# Patient Record
Sex: Female | Born: 1980 | Race: Black or African American | Hispanic: No | Marital: Single | Smoking: Former smoker
Health system: Southern US, Community
[De-identification: ages and names within clinical notes are randomized; demographics above are authoritative.]

## PROBLEM LIST (undated history)

## (undated) DIAGNOSIS — M797 Fibromyalgia: Secondary | ICD-10-CM

## (undated) DIAGNOSIS — T402X5A Adverse effect of other opioids, initial encounter: Secondary | ICD-10-CM

## (undated) DIAGNOSIS — Z87898 Personal history of other specified conditions: Secondary | ICD-10-CM

## (undated) DIAGNOSIS — R198 Other specified symptoms and signs involving the digestive system and abdomen: Secondary | ICD-10-CM

## (undated) DIAGNOSIS — R87629 Unspecified abnormal cytological findings in specimens from vagina: Secondary | ICD-10-CM

## (undated) DIAGNOSIS — I493 Ventricular premature depolarization: Secondary | ICD-10-CM

## (undated) DIAGNOSIS — T7840XA Allergy, unspecified, initial encounter: Secondary | ICD-10-CM

## (undated) DIAGNOSIS — K219 Gastro-esophageal reflux disease without esophagitis: Secondary | ICD-10-CM

## (undated) DIAGNOSIS — R011 Cardiac murmur, unspecified: Secondary | ICD-10-CM

## (undated) DIAGNOSIS — I10 Essential (primary) hypertension: Secondary | ICD-10-CM

## (undated) DIAGNOSIS — IMO0001 Reserved for inherently not codable concepts without codable children: Secondary | ICD-10-CM

## (undated) DIAGNOSIS — F32A Depression, unspecified: Secondary | ICD-10-CM

## (undated) DIAGNOSIS — Z5189 Encounter for other specified aftercare: Secondary | ICD-10-CM

## (undated) DIAGNOSIS — F419 Anxiety disorder, unspecified: Secondary | ICD-10-CM

## (undated) DIAGNOSIS — Z8619 Personal history of other infectious and parasitic diseases: Secondary | ICD-10-CM

## (undated) DIAGNOSIS — IMO0002 Reserved for concepts with insufficient information to code with codable children: Secondary | ICD-10-CM

## (undated) DIAGNOSIS — K5903 Drug induced constipation: Secondary | ICD-10-CM

## (undated) DIAGNOSIS — Z8719 Personal history of other diseases of the digestive system: Secondary | ICD-10-CM

## (undated) DIAGNOSIS — K449 Diaphragmatic hernia without obstruction or gangrene: Secondary | ICD-10-CM

## (undated) DIAGNOSIS — J189 Pneumonia, unspecified organism: Secondary | ICD-10-CM

## (undated) DIAGNOSIS — I429 Cardiomyopathy, unspecified: Secondary | ICD-10-CM

## (undated) DIAGNOSIS — Z9889 Other specified postprocedural states: Secondary | ICD-10-CM

## (undated) DIAGNOSIS — J3800 Paralysis of vocal cords and larynx, unspecified: Secondary | ICD-10-CM

## (undated) DIAGNOSIS — K220 Achalasia of cardia: Secondary | ICD-10-CM

## (undated) DIAGNOSIS — R002 Palpitations: Secondary | ICD-10-CM

## (undated) DIAGNOSIS — R87619 Unspecified abnormal cytological findings in specimens from cervix uteri: Secondary | ICD-10-CM

## (undated) DIAGNOSIS — R09A2 Foreign body sensation, throat: Secondary | ICD-10-CM

## (undated) DIAGNOSIS — M5481 Occipital neuralgia: Secondary | ICD-10-CM

## (undated) DIAGNOSIS — M1990 Unspecified osteoarthritis, unspecified site: Secondary | ICD-10-CM

## (undated) DIAGNOSIS — F329 Major depressive disorder, single episode, unspecified: Secondary | ICD-10-CM

## (undated) DIAGNOSIS — I499 Cardiac arrhythmia, unspecified: Secondary | ICD-10-CM

## (undated) DIAGNOSIS — B009 Herpesviral infection, unspecified: Secondary | ICD-10-CM

## (undated) DIAGNOSIS — R0989 Other specified symptoms and signs involving the circulatory and respiratory systems: Secondary | ICD-10-CM

## (undated) HISTORY — DX: Cardiomyopathy, unspecified: I42.9

## (undated) HISTORY — DX: Other specified postprocedural states: Z98.890

## (undated) HISTORY — DX: Other specified symptoms and signs involving the circulatory and respiratory systems: R09.89

## (undated) HISTORY — DX: Diaphragmatic hernia without obstruction or gangrene: K44.9

## (undated) HISTORY — DX: Allergy, unspecified, initial encounter: T78.40XA

## (undated) HISTORY — DX: Major depressive disorder, single episode, unspecified: F32.9

## (undated) HISTORY — DX: Ventricular premature depolarization: I49.3

## (undated) HISTORY — DX: Achalasia of cardia: K22.0

## (undated) HISTORY — DX: Cardiac murmur, unspecified: R01.1

## (undated) HISTORY — DX: Personal history of other diseases of the digestive system: Z87.19

## (undated) HISTORY — DX: Pneumonia, unspecified organism: J18.9

## (undated) HISTORY — DX: Depression, unspecified: F32.A

## (undated) HISTORY — DX: Foreign body sensation, throat: R09.A2

## (undated) HISTORY — DX: Essential (primary) hypertension: I10

## (undated) HISTORY — DX: Herpesviral infection, unspecified: B00.9

## (undated) HISTORY — PX: TONSILLECTOMY: SUR1361

## (undated) HISTORY — DX: Other specified symptoms and signs involving the digestive system and abdomen: R19.8

## (undated) HISTORY — DX: Paralysis of vocal cords and larynx, unspecified: J38.00

## (undated) HISTORY — PX: TUBAL LIGATION: SHX77

## (undated) HISTORY — DX: Gastro-esophageal reflux disease without esophagitis: K21.9

## (undated) HISTORY — DX: Unspecified abnormal cytological findings in specimens from cervix uteri: R87.619

## (undated) HISTORY — DX: Occipital neuralgia: M54.81

## (undated) HISTORY — DX: Reserved for concepts with insufficient information to code with codable children: IMO0002

## (undated) HISTORY — PX: ESOPHAGOGASTRODUODENOSCOPY: SHX1529

---
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
## 1999-01-06 ENCOUNTER — Inpatient Hospital Stay (HOSPITAL_COMMUNITY): Admission: AD | Admit: 1999-01-06 | Discharge: 1999-01-06 | Payer: Self-pay | Admitting: *Deleted

## 1999-01-24 ENCOUNTER — Inpatient Hospital Stay (HOSPITAL_COMMUNITY): Admission: AD | Admit: 1999-01-24 | Discharge: 1999-01-24 | Payer: Self-pay | Admitting: Obstetrics & Gynecology

## 1999-02-18 ENCOUNTER — Inpatient Hospital Stay (HOSPITAL_COMMUNITY): Admission: AD | Admit: 1999-02-18 | Discharge: 1999-02-18 | Payer: Self-pay | Admitting: Obstetrics & Gynecology

## 1999-02-25 ENCOUNTER — Inpatient Hospital Stay (HOSPITAL_COMMUNITY): Admission: AD | Admit: 1999-02-25 | Discharge: 1999-02-25 | Payer: Self-pay | Admitting: Obstetrics

## 1999-02-25 ENCOUNTER — Encounter: Payer: Self-pay | Admitting: Obstetrics

## 1999-03-08 ENCOUNTER — Other Ambulatory Visit: Admission: RE | Admit: 1999-03-08 | Discharge: 1999-03-08 | Payer: Self-pay | Admitting: Obstetrics

## 1999-03-18 ENCOUNTER — Ambulatory Visit (HOSPITAL_COMMUNITY): Admission: RE | Admit: 1999-03-18 | Discharge: 1999-03-18 | Payer: Self-pay | Admitting: *Deleted

## 1999-04-08 ENCOUNTER — Inpatient Hospital Stay (HOSPITAL_COMMUNITY): Admission: AD | Admit: 1999-04-08 | Discharge: 1999-04-08 | Payer: Self-pay | Admitting: *Deleted

## 1999-04-27 ENCOUNTER — Inpatient Hospital Stay (HOSPITAL_COMMUNITY): Admission: AD | Admit: 1999-04-27 | Discharge: 1999-04-29 | Payer: Self-pay | Admitting: Obstetrics

## 1999-04-28 ENCOUNTER — Encounter: Payer: Self-pay | Admitting: Obstetrics

## 1999-05-06 ENCOUNTER — Encounter: Payer: Self-pay | Admitting: Obstetrics & Gynecology

## 1999-05-06 ENCOUNTER — Inpatient Hospital Stay (HOSPITAL_COMMUNITY): Admission: AD | Admit: 1999-05-06 | Discharge: 1999-05-06 | Payer: Self-pay | Admitting: Obstetrics

## 1999-05-27 ENCOUNTER — Inpatient Hospital Stay (HOSPITAL_COMMUNITY): Admission: AD | Admit: 1999-05-27 | Discharge: 1999-05-27 | Payer: Self-pay | Admitting: Obstetrics

## 1999-05-28 ENCOUNTER — Ambulatory Visit (HOSPITAL_COMMUNITY): Admission: RE | Admit: 1999-05-28 | Discharge: 1999-05-28 | Payer: Self-pay | Admitting: Obstetrics

## 1999-05-30 ENCOUNTER — Inpatient Hospital Stay (HOSPITAL_COMMUNITY): Admission: AD | Admit: 1999-05-30 | Discharge: 1999-05-30 | Payer: Self-pay | Admitting: Obstetrics

## 1999-06-17 ENCOUNTER — Encounter: Payer: Self-pay | Admitting: Obstetrics & Gynecology

## 1999-06-17 ENCOUNTER — Inpatient Hospital Stay (HOSPITAL_COMMUNITY): Admission: RE | Admit: 1999-06-17 | Discharge: 1999-06-17 | Payer: Self-pay | Admitting: Obstetrics

## 1999-06-20 ENCOUNTER — Inpatient Hospital Stay (HOSPITAL_COMMUNITY): Admission: AD | Admit: 1999-06-20 | Discharge: 1999-06-22 | Payer: Self-pay | Admitting: Obstetrics & Gynecology

## 2000-03-26 ENCOUNTER — Encounter: Admission: RE | Admit: 2000-03-26 | Discharge: 2000-03-26 | Payer: Self-pay | Admitting: Obstetrics

## 2001-04-20 ENCOUNTER — Emergency Department (HOSPITAL_COMMUNITY): Admission: EM | Admit: 2001-04-20 | Discharge: 2001-04-20 | Payer: Self-pay | Admitting: Emergency Medicine

## 2001-07-29 HISTORY — PX: BREAST SURGERY: SHX581

## 2001-08-19 ENCOUNTER — Encounter (HOSPITAL_BASED_OUTPATIENT_CLINIC_OR_DEPARTMENT_OTHER): Payer: Self-pay | Admitting: General Surgery

## 2001-08-19 ENCOUNTER — Encounter: Admission: RE | Admit: 2001-08-19 | Discharge: 2001-08-19 | Payer: Self-pay | Admitting: General Surgery

## 2001-08-20 ENCOUNTER — Ambulatory Visit (HOSPITAL_BASED_OUTPATIENT_CLINIC_OR_DEPARTMENT_OTHER): Admission: RE | Admit: 2001-08-20 | Discharge: 2001-08-20 | Payer: Self-pay | Admitting: General Surgery

## 2001-08-20 ENCOUNTER — Encounter (INDEPENDENT_AMBULATORY_CARE_PROVIDER_SITE_OTHER): Payer: Self-pay | Admitting: *Deleted

## 2001-09-08 ENCOUNTER — Emergency Department (HOSPITAL_COMMUNITY): Admission: EM | Admit: 2001-09-08 | Discharge: 2001-09-09 | Payer: Self-pay | Admitting: Emergency Medicine

## 2002-12-29 HISTORY — PX: ADENOIDECTOMY: SUR15

## 2003-01-18 ENCOUNTER — Encounter (INDEPENDENT_AMBULATORY_CARE_PROVIDER_SITE_OTHER): Payer: Self-pay | Admitting: *Deleted

## 2003-01-18 ENCOUNTER — Ambulatory Visit (HOSPITAL_BASED_OUTPATIENT_CLINIC_OR_DEPARTMENT_OTHER): Admission: RE | Admit: 2003-01-18 | Discharge: 2003-01-18 | Payer: Self-pay | Admitting: *Deleted

## 2003-06-05 ENCOUNTER — Emergency Department (HOSPITAL_COMMUNITY): Admission: EM | Admit: 2003-06-05 | Discharge: 2003-06-05 | Payer: Self-pay | Admitting: Emergency Medicine

## 2003-06-26 ENCOUNTER — Encounter: Admission: RE | Admit: 2003-06-26 | Discharge: 2003-09-24 | Payer: Self-pay | Admitting: Obstetrics and Gynecology

## 2003-11-15 ENCOUNTER — Other Ambulatory Visit: Admission: RE | Admit: 2003-11-15 | Discharge: 2003-11-15 | Payer: Self-pay | Admitting: Obstetrics and Gynecology

## 2004-01-18 ENCOUNTER — Emergency Department (HOSPITAL_COMMUNITY): Admission: EM | Admit: 2004-01-18 | Discharge: 2004-01-18 | Payer: Self-pay | Admitting: Emergency Medicine

## 2004-02-25 ENCOUNTER — Emergency Department (HOSPITAL_COMMUNITY): Admission: EM | Admit: 2004-02-25 | Discharge: 2004-02-25 | Payer: Self-pay | Admitting: Emergency Medicine

## 2004-04-22 ENCOUNTER — Emergency Department (HOSPITAL_COMMUNITY): Admission: EM | Admit: 2004-04-22 | Discharge: 2004-04-23 | Payer: Self-pay | Admitting: *Deleted

## 2004-12-04 ENCOUNTER — Other Ambulatory Visit: Admission: RE | Admit: 2004-12-04 | Discharge: 2004-12-04 | Payer: Self-pay | Admitting: Obstetrics and Gynecology

## 2005-02-22 ENCOUNTER — Emergency Department (HOSPITAL_COMMUNITY): Admission: EM | Admit: 2005-02-22 | Discharge: 2005-02-22 | Payer: Self-pay | Admitting: Emergency Medicine

## 2005-04-12 ENCOUNTER — Emergency Department (HOSPITAL_COMMUNITY): Admission: EM | Admit: 2005-04-12 | Discharge: 2005-04-12 | Payer: Self-pay | Admitting: Emergency Medicine

## 2005-07-24 ENCOUNTER — Inpatient Hospital Stay (HOSPITAL_COMMUNITY): Admission: AD | Admit: 2005-07-24 | Discharge: 2005-07-24 | Payer: Self-pay | Admitting: *Deleted

## 2005-10-16 ENCOUNTER — Inpatient Hospital Stay (HOSPITAL_COMMUNITY): Admission: AD | Admit: 2005-10-16 | Discharge: 2005-10-16 | Payer: Self-pay | Admitting: Obstetrics and Gynecology

## 2005-11-18 ENCOUNTER — Ambulatory Visit: Payer: Self-pay | Admitting: Internal Medicine

## 2006-01-29 DIAGNOSIS — IMO0001 Reserved for inherently not codable concepts without codable children: Secondary | ICD-10-CM

## 2006-01-29 DIAGNOSIS — Z5189 Encounter for other specified aftercare: Secondary | ICD-10-CM

## 2006-01-29 HISTORY — PX: DILATION AND CURETTAGE OF UTERUS: SHX78

## 2006-01-29 HISTORY — DX: Encounter for other specified aftercare: Z51.89

## 2006-01-29 HISTORY — DX: Reserved for inherently not codable concepts without codable children: IMO0001

## 2006-02-11 ENCOUNTER — Inpatient Hospital Stay (HOSPITAL_COMMUNITY): Admission: AD | Admit: 2006-02-11 | Discharge: 2006-02-13 | Payer: Self-pay | Admitting: Obstetrics and Gynecology

## 2006-02-13 ENCOUNTER — Inpatient Hospital Stay (HOSPITAL_COMMUNITY): Admission: AD | Admit: 2006-02-13 | Discharge: 2006-02-14 | Payer: Self-pay | Admitting: *Deleted

## 2006-02-14 ENCOUNTER — Encounter (INDEPENDENT_AMBULATORY_CARE_PROVIDER_SITE_OTHER): Payer: Self-pay | Admitting: Specialist

## 2006-03-30 ENCOUNTER — Other Ambulatory Visit: Admission: RE | Admit: 2006-03-30 | Discharge: 2006-03-30 | Payer: Self-pay | Admitting: Obstetrics and Gynecology

## 2006-04-30 ENCOUNTER — Other Ambulatory Visit: Admission: RE | Admit: 2006-04-30 | Discharge: 2006-04-30 | Payer: Self-pay | Admitting: Obstetrics and Gynecology

## 2006-09-26 ENCOUNTER — Emergency Department (HOSPITAL_COMMUNITY): Admission: EM | Admit: 2006-09-26 | Discharge: 2006-09-26 | Payer: Self-pay | Admitting: Emergency Medicine

## 2006-11-17 ENCOUNTER — Other Ambulatory Visit: Admission: RE | Admit: 2006-11-17 | Discharge: 2006-11-17 | Payer: Self-pay | Admitting: Obstetrics and Gynecology

## 2007-02-10 ENCOUNTER — Encounter: Admission: RE | Admit: 2007-02-10 | Discharge: 2007-02-10 | Payer: Self-pay | Admitting: Otolaryngology

## 2007-02-17 ENCOUNTER — Emergency Department (HOSPITAL_COMMUNITY): Admission: EM | Admit: 2007-02-17 | Discharge: 2007-02-17 | Payer: Self-pay | Admitting: Emergency Medicine

## 2007-05-18 ENCOUNTER — Ambulatory Visit: Payer: Self-pay | Admitting: Internal Medicine

## 2007-10-13 ENCOUNTER — Emergency Department (HOSPITAL_COMMUNITY): Admission: EM | Admit: 2007-10-13 | Discharge: 2007-10-13 | Payer: Self-pay | Admitting: Emergency Medicine

## 2008-09-20 ENCOUNTER — Ambulatory Visit: Payer: Self-pay | Admitting: Cardiology

## 2008-09-20 ENCOUNTER — Ambulatory Visit: Payer: Self-pay

## 2008-09-22 ENCOUNTER — Encounter: Admission: RE | Admit: 2008-09-22 | Discharge: 2008-09-22 | Payer: Self-pay | Admitting: Otolaryngology

## 2008-10-09 DIAGNOSIS — J45909 Unspecified asthma, uncomplicated: Secondary | ICD-10-CM | POA: Insufficient documentation

## 2008-10-09 DIAGNOSIS — F329 Major depressive disorder, single episode, unspecified: Secondary | ICD-10-CM

## 2008-10-09 DIAGNOSIS — F3289 Other specified depressive episodes: Secondary | ICD-10-CM

## 2008-10-09 HISTORY — DX: Unspecified asthma, uncomplicated: J45.909

## 2008-10-09 HISTORY — DX: Other specified depressive episodes: F32.89

## 2008-10-09 HISTORY — DX: Major depressive disorder, single episode, unspecified: F32.9

## 2008-10-12 ENCOUNTER — Ambulatory Visit: Payer: Self-pay | Admitting: Internal Medicine

## 2008-11-07 ENCOUNTER — Ambulatory Visit: Payer: Self-pay | Admitting: Internal Medicine

## 2008-11-11 ENCOUNTER — Emergency Department (HOSPITAL_COMMUNITY): Admission: EM | Admit: 2008-11-11 | Discharge: 2008-11-11 | Payer: Self-pay | Admitting: Emergency Medicine

## 2008-11-12 ENCOUNTER — Emergency Department (HOSPITAL_COMMUNITY): Admission: EM | Admit: 2008-11-12 | Discharge: 2008-11-12 | Payer: Self-pay | Admitting: Emergency Medicine

## 2008-11-27 ENCOUNTER — Ambulatory Visit: Payer: Self-pay | Admitting: Internal Medicine

## 2008-11-30 ENCOUNTER — Ambulatory Visit: Payer: Self-pay | Admitting: Gastroenterology

## 2008-11-30 DIAGNOSIS — K219 Gastro-esophageal reflux disease without esophagitis: Secondary | ICD-10-CM | POA: Insufficient documentation

## 2008-11-30 DIAGNOSIS — K5900 Constipation, unspecified: Secondary | ICD-10-CM

## 2008-11-30 HISTORY — DX: Gastro-esophageal reflux disease without esophagitis: K21.9

## 2008-11-30 HISTORY — DX: Constipation, unspecified: K59.00

## 2008-12-08 ENCOUNTER — Encounter: Payer: Self-pay | Admitting: Internal Medicine

## 2008-12-19 ENCOUNTER — Ambulatory Visit: Payer: Self-pay | Admitting: Gastroenterology

## 2009-01-24 ENCOUNTER — Telehealth: Payer: Self-pay | Admitting: Gastroenterology

## 2009-01-25 ENCOUNTER — Telehealth: Payer: Self-pay | Admitting: Gastroenterology

## 2009-01-25 ENCOUNTER — Ambulatory Visit (HOSPITAL_COMMUNITY): Admission: RE | Admit: 2009-01-25 | Discharge: 2009-01-25 | Payer: Self-pay | Admitting: Gastroenterology

## 2009-01-25 ENCOUNTER — Ambulatory Visit: Payer: Self-pay | Admitting: Gastroenterology

## 2009-01-26 ENCOUNTER — Ambulatory Visit (HOSPITAL_COMMUNITY): Admission: RE | Admit: 2009-01-26 | Discharge: 2009-01-26 | Payer: Self-pay | Admitting: Gastroenterology

## 2009-02-05 ENCOUNTER — Ambulatory Visit: Payer: Self-pay | Admitting: Gastroenterology

## 2009-02-12 ENCOUNTER — Encounter: Payer: Self-pay | Admitting: Gastroenterology

## 2009-04-25 ENCOUNTER — Encounter (INDEPENDENT_AMBULATORY_CARE_PROVIDER_SITE_OTHER): Payer: Self-pay | Admitting: *Deleted

## 2009-08-01 ENCOUNTER — Emergency Department (HOSPITAL_COMMUNITY): Admission: EM | Admit: 2009-08-01 | Discharge: 2009-08-01 | Payer: Self-pay | Admitting: Family Medicine

## 2009-08-09 ENCOUNTER — Encounter (INDEPENDENT_AMBULATORY_CARE_PROVIDER_SITE_OTHER): Payer: Self-pay | Admitting: *Deleted

## 2009-08-09 ENCOUNTER — Telehealth: Payer: Self-pay | Admitting: Gastroenterology

## 2009-08-09 ENCOUNTER — Emergency Department (HOSPITAL_COMMUNITY): Admission: EM | Admit: 2009-08-09 | Discharge: 2009-08-09 | Payer: Self-pay | Admitting: Emergency Medicine

## 2009-08-10 ENCOUNTER — Ambulatory Visit: Payer: Self-pay | Admitting: Gastroenterology

## 2009-08-10 ENCOUNTER — Ambulatory Visit (HOSPITAL_COMMUNITY): Admission: RE | Admit: 2009-08-10 | Discharge: 2009-08-10 | Payer: Self-pay | Admitting: Gastroenterology

## 2009-08-10 DIAGNOSIS — R1310 Dysphagia, unspecified: Secondary | ICD-10-CM | POA: Insufficient documentation

## 2009-08-10 DIAGNOSIS — K222 Esophageal obstruction: Secondary | ICD-10-CM

## 2009-08-10 HISTORY — DX: Esophageal obstruction: K22.2

## 2009-08-10 HISTORY — DX: Dysphagia, unspecified: R13.10

## 2009-08-11 ENCOUNTER — Emergency Department (HOSPITAL_COMMUNITY): Admission: EM | Admit: 2009-08-11 | Discharge: 2009-08-11 | Payer: Self-pay | Admitting: Emergency Medicine

## 2009-08-21 ENCOUNTER — Encounter: Payer: Self-pay | Admitting: Physician Assistant

## 2009-09-04 ENCOUNTER — Emergency Department (HOSPITAL_COMMUNITY): Admission: EM | Admit: 2009-09-04 | Discharge: 2009-09-05 | Payer: Self-pay | Admitting: Emergency Medicine

## 2009-09-05 ENCOUNTER — Telehealth: Payer: Self-pay | Admitting: Gastroenterology

## 2009-09-10 ENCOUNTER — Telehealth: Payer: Self-pay | Admitting: Gastroenterology

## 2009-09-11 ENCOUNTER — Encounter: Payer: Self-pay | Admitting: Gastroenterology

## 2009-09-17 ENCOUNTER — Encounter: Admission: RE | Admit: 2009-09-17 | Discharge: 2009-09-17 | Payer: Self-pay | Admitting: Otolaryngology

## 2009-09-18 ENCOUNTER — Telehealth (INDEPENDENT_AMBULATORY_CARE_PROVIDER_SITE_OTHER): Payer: Self-pay | Admitting: *Deleted

## 2009-09-30 ENCOUNTER — Emergency Department (HOSPITAL_COMMUNITY): Admission: EM | Admit: 2009-09-30 | Discharge: 2009-09-30 | Payer: Self-pay | Admitting: Orthopedic Surgery

## 2009-10-02 ENCOUNTER — Ambulatory Visit (HOSPITAL_COMMUNITY): Admission: RE | Admit: 2009-10-02 | Discharge: 2009-10-02 | Payer: Self-pay | Admitting: Otolaryngology

## 2009-10-10 ENCOUNTER — Telehealth: Payer: Self-pay | Admitting: Gastroenterology

## 2009-11-05 ENCOUNTER — Ambulatory Visit (HOSPITAL_COMMUNITY): Admission: RE | Admit: 2009-11-05 | Discharge: 2009-11-05 | Payer: Self-pay | Admitting: Otolaryngology

## 2010-01-03 ENCOUNTER — Telehealth: Payer: Self-pay | Admitting: Gastroenterology

## 2010-01-03 ENCOUNTER — Encounter: Payer: Self-pay | Admitting: Gastroenterology

## 2010-01-04 ENCOUNTER — Ambulatory Visit: Payer: Self-pay | Admitting: Gastroenterology

## 2010-01-05 ENCOUNTER — Emergency Department (HOSPITAL_COMMUNITY): Admission: EM | Admit: 2010-01-05 | Discharge: 2010-01-05 | Payer: Self-pay | Admitting: Emergency Medicine

## 2010-01-15 ENCOUNTER — Ambulatory Visit (HOSPITAL_COMMUNITY): Admission: RE | Admit: 2010-01-15 | Discharge: 2010-01-15 | Payer: Self-pay | Admitting: Gastroenterology

## 2010-01-15 ENCOUNTER — Encounter: Payer: Self-pay | Admitting: Gastroenterology

## 2010-01-22 ENCOUNTER — Ambulatory Visit: Payer: Self-pay | Admitting: Gastroenterology

## 2010-01-24 ENCOUNTER — Telehealth: Payer: Self-pay | Admitting: Gastroenterology

## 2010-01-31 ENCOUNTER — Ambulatory Visit: Payer: Self-pay | Admitting: Gastroenterology

## 2010-02-12 ENCOUNTER — Encounter: Payer: Self-pay | Admitting: Gastroenterology

## 2010-02-12 ENCOUNTER — Ambulatory Visit (HOSPITAL_COMMUNITY): Admission: RE | Admit: 2010-02-12 | Discharge: 2010-02-12 | Payer: Self-pay | Admitting: Gastroenterology

## 2010-03-22 ENCOUNTER — Ambulatory Visit: Payer: Self-pay | Admitting: Gastroenterology

## 2010-03-30 ENCOUNTER — Emergency Department (HOSPITAL_COMMUNITY): Admission: EM | Admit: 2010-03-30 | Discharge: 2010-03-30 | Payer: Self-pay | Admitting: Emergency Medicine

## 2010-04-04 ENCOUNTER — Telehealth: Payer: Self-pay | Admitting: Gastroenterology

## 2010-06-18 ENCOUNTER — Telehealth: Payer: Self-pay | Admitting: Gastroenterology

## 2010-07-22 ENCOUNTER — Encounter: Payer: Self-pay | Admitting: Gastroenterology

## 2010-08-12 ENCOUNTER — Telehealth: Payer: Self-pay | Admitting: Gastroenterology

## 2010-09-12 ENCOUNTER — Emergency Department (HOSPITAL_COMMUNITY): Admission: EM | Admit: 2010-09-12 | Discharge: 2010-09-12 | Payer: Self-pay | Admitting: Emergency Medicine

## 2010-11-30 ENCOUNTER — Emergency Department (HOSPITAL_COMMUNITY)
Admission: EM | Admit: 2010-11-30 | Discharge: 2010-12-01 | Payer: Self-pay | Source: Home / Self Care | Admitting: Emergency Medicine

## 2011-01-19 ENCOUNTER — Encounter: Payer: Self-pay | Admitting: Gastroenterology

## 2011-01-28 NOTE — Progress Notes (Signed)
Summary: Schedule follow up appointment  Phone Note Outgoing Call Call back at Home Phone [phone]   Call placed by: Merri Ray CMA Duncan Dull),  January 24, 2010 10:47 AM Summary of Call: Called pt L/M to R/C to schedule a follow up office appointment with Dr Arlyce Dice Initial call taken by: Merri Ray CMA Duncan Dull),  January 24, 2010 10:48 AM  Follow-up for Phone Call        ---- Converted from flag ---- ---- 01/22/2010 12:24 PM, Louis Meckel MD wrote: please schedule OV Follow-up by: Merri Ray CMA Duncan Dull),  January 24, 2010 11:02 AM  Additional Follow-up for Phone Call Additional follow up Details #1::        Pt scheduled for F/U appointment on 2/3 at 8:30 Additional Follow-up by: Merri Ray CMA Duncan Dull),  January 24, 2010 11:03 AM

## 2011-01-28 NOTE — Procedures (Signed)
Summary: Probe Orders/[facility]  Probe Orders/[facility]   Imported By: Lester Davison 02/01/2010 09:33:47  _____________________________________________________________________  External Attachment:    Type:   Image     Comment:   External Document

## 2011-01-28 NOTE — Progress Notes (Signed)
Summary: [facility] Appt. Scheduled  Phone Note Call from Patient Call back at Home Phone [phone]   Caller: Patient Call For: Arlyce Dice Reason for Call: Talk to Nurse Summary of Call: Patient wants to know when is she going to be set up for surgery. Initial call taken by: Tawni Levy,  April 04, 2010 10:08 AM  Follow-up for Phone Call        Pt. is aware she is scheduled to see Dr.Kundu on 05-22-10 at 1:15pm. Pt. aware they will be mailing her the appt. information.  Pt. instructed to call back as needed.  Follow-up by: Laureen Ochs LPN,  April 04, 2010 10:41 AM

## 2011-01-28 NOTE — Progress Notes (Signed)
Summary: Severe Dysphagia  Phone Note Outgoing Call Call back at [facility] Phone [phone]   Call placed by: Merri Ray CMA Duncan Dull),  January 03, 2010 2:42 PM Summary of Call: Called pt to inform that her insurance will not cover the prevacid solutab. Pt c/o severe dysphagia, stating its getting alot worse. Cant swallow pills and carafate is no good. Wants powder form of Omeprazole. Scheduled her to see Dr Arlyce Dice at 8:30am on 01/04/2010 Initial call taken by: Merri Ray CMA Duncan Dull),  January 03, 2010 2:44 PM

## 2011-01-28 NOTE — Procedures (Signed)
Summary: Upper Endoscopy  Patient: Raven [name] Note: All result statuses are Final unless otherwise noted.  Tests: (1) Upper Endoscopy (EGD)   EGD Upper Endoscopy       DONE     [facility]     [address] [place], [place]  [address]           ENDOSCOPY PROCEDURE REPORT           PATIENT:  [name], [name]  MR#:  [id number]     BIRTHDATE:  08/01/1980, 29 yrs. old  GENDER:  female           ENDOSCOPIST:  Barbette Hair. Arlyce Dice, MD     Referred by:           PROCEDURE DATE:  02/12/2010     PROCEDURE:  EGD w/botox injection     ASA CLASS:  Class II     INDICATIONS:  Botox injection for achalasia           MEDICATIONS:   Fentanyl 100 mcg, Versed 7 mg IV, glycopyrrolate     (Robinal) 0.2 mg     TOPICAL ANESTHETIC:  Cetacaine Spray           DESCRIPTION OF PROCEDURE:   After the risks benefits and     alternatives of the procedure were thoroughly explained, informed     consent was obtained.  The EG-[id number] ([id number]) endoscope was     introduced through the mouth and advanced to the third portion of     the duodenum, without limitations.  The instrument was slowly     withdrawn as the mucosa was fully examined.           stenosis at the gastroesophageal junction. Scope easily passes     through. botox injection 25 units (1cc) injected into each     quadrant intramucosally at the GE junction  Otherwise the     examination was normal.    Retroflexed views revealed no     abnormalities.    The scope was then withdrawn from the patient     and the procedure completed.           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 Arlyce Dice, MD           CC:           n.     eSIGNED:   Barbette Hair. Francyne Arreaga at 02/12/2010 01:21 PM           Guinevere Ferrari, [id number]  Note: An exclamation mark (!) indicates a result that was not dispersed into the flowsheet. Document Creation Date: 02/12/2010 1:21 PM _______________________________________________________________________  (1) Order result status: Final Collection or observation date-time: 02/12/2010 13:17 Requested date-time:  Receipt date-time:  Reported date-time:  Referring Physician:   Ordering Physician: Melvia Heaps [phone]) Specimen  Source:  Source: Launa Grill Order Number: [id number] Lab site:

## 2011-01-28 NOTE — Assessment & Plan Note (Signed)
Summary: F/U from [facility] study/rs  8:15 appt   History of Present Illness Visit Type: Follow-up Visit Primary GI MD: Melvia Heaps MD [facility] Primary Provider: [facility]  Requesting Provider: n/a Chief Complaint: Results from Manometry; dysphagia History of Present Illness:   [name] has returned on esophageal manometry.  Manometry demonstrated a low percent nonperistaltic contractions in the esophageal body.  Although the LES resting pressure was normal, relaxation was incomplete.  Findings suggest early achalasia.   GI Review of Systems    Reports acid reflux, dysphagia with liquids, dysphagia with solids, and  vomiting.      Denies abdominal pain, belching, bloating, chest pain, heartburn, loss of appetite, nausea, vomiting blood, weight loss, and  weight gain.        Denies anal fissure, black tarry stools, change in bowel habit, constipation, diarrhea, diverticulosis, fecal incontinence, heme positive stool, hemorrhoids, irritable bowel syndrome, jaundice, light color stool, liver problems, rectal bleeding, and  rectal pain.    Current Medications (verified): 1)  Qvar 80 Mcg/act Aers (Beclomethasone Dipropionate) .Marland Kitchen.. 1 Puff Twice A Day 2)  Flonase 50 Mcg/act Susp (Fluticasone Propionate) .... As Needed 3)  Carafate 1 Gm/37ml Susp (Sucralfate) .Marland Kitchen.. 1gm in Cup of Water Four Times A Day 4)  Prilosec 20 Mg Cpdr (Omeprazole) .... Take One Tab Daily  Allergies (verified): No Known Drug Allergies  Past History:  Past Medical History: Reviewed history from 08/10/2009 and no changes required. ASTHMA (ICD-493.90) Depression GERD,HX OF ESOPHAGEAL STRICTURE  Past Surgical History: Reviewed history from 08/10/2009 and no changes required. tonsillectomy adnoidectomy tumor removed rt. breast EGD WITH ESOPHAGEAL DILATION 12/09  Family History: Reviewed history from 02/05/2009 and no changes required. Heart dz- Father Stroke- Mother No FH of Colon  Cancer:  Social History: Reviewed history from 02/05/2009 and no changes required. Current smoker.  Smokes 5 cigs per day.   ETOH on occ Single Children Occupation: Student  Review of Systems       The patient complains of allergy/sinus, headaches-new, and sleeping problems.    Vital Signs:  Patient profile:   30 year old female Height:      69 inches Weight:      199.50 pounds BMI:     29.57 Pulse rate:   80 / minute Pulse rhythm:   regular BP sitting:   106 / 68  (left arm) Cuff size:   regular  Vitals Entered By: June McMurray CMA Duncan Dull) (January 31, 2010 9:41 AM)   Impression & Recommendations:  Problem # 1:  DYSPHAGIA (NFA-213.08)  She probably has an esophageal motility disorder such as achalasia.  Recommendations #1 upper endoscopy with Botox injection  Risks, alternatives, and complications of the procedure, including bleeding, perforation, and possible need for surgery, were explained to the patient.  Patient's questions were answered.  Orders: ZENDO with Botox (ZENDO/Botox)  [facility] Inst[facility]: [facility] 2)  Your EGD is scheduled for 02/12/2010 at 12:30pmat [facility] Endo 3)  The medication list was reviewed and reconciled.  All changed / newly prescribed medications were explained.  A complete medication list was provided to the patient / caregiver.

## 2011-01-28 NOTE — Medication Information (Signed)
Summary: Prevacid-DENIED/[place] [employer]  Prevacid-DENIED/ Medicaid   Imported By: Sherian Rein 01/08/2010 10:35:45  _____________________________________________________________________  External Attachment:    Type:   Image     Comment:   External Document

## 2011-01-28 NOTE — Progress Notes (Signed)
Summary: Triage  Phone Note Call from Patient Call back at Home Phone [phone]   Caller: Patient Call For: Dr. Arlyce Dice Reason for Call: Talk to Nurse Summary of Call: would like a doctor's note to her school explaining her GI conditions Initial call taken by: Vallarie Mare,  August 12, 2010 10:54 AM  Follow-up for Phone Call        Pt. wants copies of her medical records for school. She was advised to come to our medical records dept., sign a release and they will give her a copy of whatever records she requests. Pt. instructed to call back as needed.  Follow-up by: Laureen Ochs LPN,  August 12, 2010 11:20 AM

## 2011-01-28 NOTE — Letter (Signed)
Summary: [facility]  Gastroenterology/[facility]   Imported By: Lester [place] 08/07/2010 10:02:15  _____________________________________________________________________  External Attachment:    Type:   Image     Comment:   External Document

## 2011-01-28 NOTE — Procedures (Signed)
Summary: Manometry/[facility]  Manometry/[facility]   Imported By: Lester Ransom 01/26/2010 08:56:59  _____________________________________________________________________  External Attachment:    Type:   Image     Comment:   External Document

## 2011-01-28 NOTE — Assessment & Plan Note (Signed)
Summary: HAVING SEVERE DYSPHAGIA/NEEDS A POWDER FORM OF PPI   History of Present Illness Visit Type: follow  Primary GI MD: Melvia Heaps MD [facility] Primary Provider: [facility]  Requesting Provider: n/a Chief Complaint: dysphagia and vomiting  History of Present Illness:   Ms. Nelson has returned for reevaluation of dysphagia.  She underwent ENT examination that was unremarkable.  Neck CT apparently was ordered though I do not have the results.  Ms. Mclaurin is complaining of dysphagia to solids and liquids.  With swallowing liquids and solids she has the seasation of contents staying in her chest.  She may bend over and vomit or regurgitate  undigested food.  She has undergone dilatation in the past.  Her last endoscopy no stricture was seen.  Barium swallow also did not demonstrate any abnormalities.   GI Review of Systems      Denies abdominal pain, acid reflux, belching, bloating, chest pain, dysphagia with liquids, dysphagia with solids, heartburn, loss of appetite, nausea, vomiting, vomiting blood, weight loss, and  weight gain.        Denies anal fissure, black tarry stools, change in bowel habit, constipation, diarrhea, diverticulosis, fecal incontinence, heme positive stool, hemorrhoids, irritable bowel syndrome, jaundice, light color stool, liver problems, rectal bleeding, and  rectal pain.    Current Medications (verified): 1)  Qvar 80 Mcg/act Aers (Beclomethasone Dipropionate) .Marland Kitchen.. 1 Puff Twice A Day 2)  Flonase 50 Mcg/act Susp (Fluticasone Propionate) .... As Needed 3)  Carafate 1 Gm/42ml Susp (Sucralfate) .Marland Kitchen.. 1gm in Cup of Water Four Times A Day  Allergies (verified): No Known Drug Allergies  Past History:  Past Medical History: Reviewed history from 08/10/2009 and no changes required. ASTHMA (ICD-493.90) Depression GERD,HX OF ESOPHAGEAL STRICTURE  Past Surgical History: Reviewed history from 08/10/2009 and no changes  required. tonsillectomy adnoidectomy tumor removed rt. breast EGD WITH ESOPHAGEAL DILATION 12/09  Family History: Reviewed history from 02/05/2009 and no changes required. Heart dz- Father Stroke- Mother No FH of Colon Cancer:  Social History: Reviewed history from 02/05/2009 and no changes required. Current smoker.  Smokes 5 cigs per day.   ETOH on occ Single Children Occupation: Student  Review of Systems  The patient denies allergy/sinus, anemia, anxiety-new, arthritis/joint pain, back pain, blood in urine, breast changes/lumps, change in vision, confusion, cough, coughing up blood, depression-new, fainting, fatigue, fever, headaches-new, hearing problems, heart murmur, heart rhythm changes, itching, menstrual pain, muscle pains/cramps, night sweats, nosebleeds, pregnancy symptoms, shortness of breath, skin rash, sleeping problems, sore throat, swelling of feet/legs, swollen lymph glands, thirst - excessive , urination - excessive , urination changes/pain, urine leakage, vision changes, and voice change.    Vital Signs:  Patient profile:   30 year old female Height:      69 inches Weight:      203 pounds BMI:     30.09 BSA:     2.08 Pulse rate:   80 / minute Pulse rhythm:   regular BP sitting:   130 / 78  (left arm) Cuff size:   regular  Vitals Entered By: Ok Anis CMA (January 04, 2010 8:20 AM)   Impression & Recommendations:  Problem # 1:  DYSPHAGIA (ICD-787.29) I suspect that she may have a primary motility disorder such as achalasia. Eosinophilic esophagitis is less likely.  Recommendations #1 esophageal manometry Prescriptions: PRILOSEC 20 MG CPDR (OMEPRAZOLE) take one tab daily  #30 x 2   Entered and Authorized by:   Louis Meckel MD   Signed by:  Louis Meckel MD on 01/04/2010   Method used:   Electronically to        [facility] (retail)       [address]       [place], [place]  [id number]       Ph: [phone]        Fax: [phone]   RxID:   [id number]   Appended Document: Orders Update    Clinical Lists Changes  Orders: Added new Test order of Mano/24 hour pH Probe (Mano/24 pH) - Signed

## 2011-01-28 NOTE — Progress Notes (Signed)
Summary: triage  Phone Note Call from Patient Call back at Home Phone [phone]   Caller: Patient Call For: Arlyce Dice Reason for Call: Refill Medication Summary of Call: Patient states that she needs refill for her Ativan for panic attacks given to her at the emergency room and she also states that she has a  knot in her bottom. Initial call taken by: Tawni Levy,  June 18, 2010 11:50 AM  Follow-up for Phone Call        1) Pt. will need to see her PCP about treatment for anxiety.   2) Pt. c/o a knot inside of her rectum, her PCP scheduled an appt. with a surgeon for consult on a cyst. Pt. will have the report faxed to Dr.Kaplan. Pt. instructed to call back as needed.   Follow-up by: Laureen Ochs LPN,  June 18, 2010 12:06 PM

## 2011-01-28 NOTE — Medication Information (Signed)
Summary: PREVACID approval/ACS  PREVACID approval/ACS   Imported By: Lester [place] 01/17/2010 09:53:12  _____________________________________________________________________  External Attachment:    Type:   Image     Comment:   External Document

## 2011-01-28 NOTE — Assessment & Plan Note (Signed)
Summary: F/U FROM ENDO/BOTOX ON 02-12-10.          [name]   History of Present Illness Visit Type: Follow-up Visit Primary GI MD: Melvia Heaps MD [facility] Primary Provider: [facility]  Requesting Provider: n/a Chief Complaint: Patient complains she is still having some vomiting at least once a day. She states that she feels like food is getting stuck in a pocket in her throat, she is a little better but not all the way.  History of Present Illness:   Raven [name] has returned following upper endoscopy with Botox injection of her LES.  The patient reports significant improvement in her dysphagia to liquids.  She still has moderate dysphagia to solids and will cough up food  that does not seem to pass  into her stomach.   On Prilosec her pyrosis is gone.   GI Review of Systems    Reports dysphagia with solids and  vomiting.      Denies abdominal pain, acid reflux, belching, bloating, chest pain, dysphagia with liquids, heartburn, loss of appetite, nausea, vomiting blood, weight loss, and  weight gain.        Denies anal fissure, black tarry stools, change in bowel habit, constipation, diarrhea, diverticulosis, fecal incontinence, heme positive stool, hemorrhoids, irritable bowel syndrome, jaundice, light color stool, liver problems, rectal bleeding, and  rectal pain.    Current Medications (verified): 1)  Qvar 80 Mcg/act Aers (Beclomethasone Dipropionate) .Marland Kitchen.. 1 Puff Twice A Day 2)  Flonase 50 Mcg/act Susp (Fluticasone Propionate) .... As Needed 3)  Prilosec 20 Mg Cpdr (Omeprazole) .... Take One Tab Daily  Allergies (verified): No Known Drug Allergies  Past History:  Past Medical History: ASTHMA (ICD-493.90) Depression GERD,HX OF ESOPHAGEAL STRICTURE achalasia  Past Surgical History: Reviewed history from 08/10/2009 and no changes required. tonsillectomy adnoidectomy tumor removed rt. breast EGD WITH ESOPHAGEAL DILATION 12/09  Family History: Reviewed history from  02/05/2009 and no changes required. Heart dz- Father Stroke- Mother No FH of Colon Cancer:  Social History: Reviewed history from 02/05/2009 and no changes required. Current smoker.  Smokes 5 cigs per day.   ETOH on occ Single Children Occupation: Student  Review of Systems  The patient denies allergy/sinus, anemia, anxiety-new, arthritis/joint pain, back pain, blood in urine, breast changes/lumps, change in vision, confusion, cough, coughing up blood, depression-new, fainting, fatigue, fever, headaches-new, hearing problems, heart murmur, heart rhythm changes, itching, menstrual pain, muscle pains/cramps, night sweats, nosebleeds, pregnancy symptoms, shortness of breath, skin rash, sleeping problems, sore throat, swelling of feet/legs, swollen lymph glands, thirst - excessive , urination - excessive , urination changes/pain, urine leakage, vision changes, and voice change.    Vital Signs:  Patient profile:   30 year old female Height:      69 inches Weight:      196.4 pounds BMI:     29.11 Pulse rate:   88 / minute Pulse rhythm:   regular BP sitting:   112 / 70  (left arm) Cuff size:   regular  Vitals Entered By: Harlow Mares CMA Duncan Dull) (March 22, 2010 4:08 PM)   Impression & Recommendations:  Problem # 1:  DYSPHAGIA (ICD-787.29) status post esophageal dilatation with [facility] dilator to 18 mm in December, 2009 status post barium esophagram x2 in January, 2010 and August, 2010 because of severe dysphagia to solids and liquids.  Both x-ray studies negative esophageal manometry in January, 2011 demonstrating normal resting LES pressure with 84% relaxation, 89% peristaltic contractions and 11% nonperistalytic (nontransmitted) contractions status  post Botox injection of the LES with moderate improvement but incomplete  I strongly suspect that the patient has a motility disorder which probably is early achalasia.  The manometry study is equivocal, however.  Recommendations #1 I  will obtain a second opinion before referring this patient for a myotomy.  In the interim she will stay on Prilosec  Patient Instructions: 1)  We will be contacting you with an appointment to [facility] as soon as one becomes available. 2)  The medication list was reviewed and reconciled.  All changed / newly prescribed medications were explained.  A complete medication list was provided to the patient / caregiver.

## 2011-01-28 NOTE — Procedures (Signed)
Summary: Instructions for procedure/[facility] (out pt)  Instructions for procedure/[facility] (out pt)   Imported By: Sherian Rein 02/01/2010 15:13:39  _____________________________________________________________________  External Attachment:    Type:   Image     Comment:   External Document

## 2011-01-28 NOTE — Letter (Signed)
Summary: EGD Instructions  [facility]  [address] [place], [place] [address]   Phone: [phone]  Fax: [phone]       Raven [name]    08/01/1980    MRN: [id number]       Procedure Day /Date:TUESDAY 02/12/2010     Arrival Time: 11:30AM     Procedure Time:12:30PM    Location of Procedure:                     X  [facility] ( Outpatient Registration)    PREPARATION FOR ENDOSCOPY/BOTOX INJ   On 02/12/2010 THE DAY OF THE PROCEDURE:  1.   No solid foods, milk or milk products are allowed after midnight the night before your procedure.  2.   Do not drink anything colored red or purple.  Avoid juices with pulp.  No orange juice.  3.  You may drink clear liquids until 8:30AM , which is 4 hours before your procedure.                                                                                                CLEAR LIQUIDS INCLUDE: Water Jello Ice Popsicles Tea (sugar ok, no milk/cream) Powdered fruit flavored drinks Coffee (sugar ok, no milk/cream) Gatorade Juice: apple, white grape, white cranberry  Lemonade Clear bullion, consomm, broth Carbonated beverages (any kind) Strained chicken noodle soup Hard Candy   MEDICATION INSTRUCTIONS  Unless otherwise instructed, you should take regular prescription medications with a small sip of water as early as possible the morning of your procedure.           OTHER INSTRUCTIONS  You will need a responsible adult at least 31 years of age to accompany you and drive you home.   This person must remain in the waiting room during your procedure.  Wear loose fitting clothing that is easily removed.  Leave jewelry and other valuables at home.  However, you may wish to bring a book to read or an iPod/MP3 player to listen to music as you wait for your procedure to start.  Remove all body piercing jewelry and leave at home.  Total time from sign-in until discharge is approximately 2-3 hours.  You  should go home directly after your procedure and rest.  You can resume normal activities the day after your procedure.  The day of your procedure you should not:   Drive   Make legal decisions   Operate machinery   Drink alcohol   Return to work  You will receive specific instructions about eating, activities and medications before you leave.    The above instructions have been reviewed and explained to me by   _______________________    I fully understand and can verbalize these instructions _____________________________ Date _________

## 2011-02-11 ENCOUNTER — Emergency Department (HOSPITAL_COMMUNITY): Payer: Medicaid Other

## 2011-02-11 ENCOUNTER — Emergency Department (HOSPITAL_COMMUNITY)
Admission: EM | Admit: 2011-02-11 | Discharge: 2011-02-11 | Disposition: A | Discharge status: Home / Self Care | Payer: Medicaid Other | Attending: Emergency Medicine | Admitting: Emergency Medicine

## 2011-02-11 DIAGNOSIS — R51 Headache: Secondary | ICD-10-CM | POA: Insufficient documentation

## 2011-02-11 DIAGNOSIS — J45909 Unspecified asthma, uncomplicated: Secondary | ICD-10-CM | POA: Insufficient documentation

## 2011-02-11 DIAGNOSIS — R05 Cough: Secondary | ICD-10-CM | POA: Insufficient documentation

## 2011-02-11 DIAGNOSIS — R059 Cough, unspecified: Secondary | ICD-10-CM | POA: Insufficient documentation

## 2011-03-07 ENCOUNTER — Emergency Department (HOSPITAL_COMMUNITY)
Admission: EM | Admit: 2011-03-07 | Discharge: 2011-03-07 | Disposition: A | Discharge status: Home / Self Care | Payer: Medicaid Other | Attending: Emergency Medicine | Admitting: Emergency Medicine

## 2011-03-07 ENCOUNTER — Emergency Department (HOSPITAL_COMMUNITY): Payer: Medicaid Other

## 2011-03-07 DIAGNOSIS — F411 Generalized anxiety disorder: Secondary | ICD-10-CM | POA: Insufficient documentation

## 2011-03-07 DIAGNOSIS — R002 Palpitations: Secondary | ICD-10-CM | POA: Insufficient documentation

## 2011-03-07 DIAGNOSIS — Z79899 Other long term (current) drug therapy: Secondary | ICD-10-CM | POA: Insufficient documentation

## 2011-03-07 DIAGNOSIS — R0789 Other chest pain: Secondary | ICD-10-CM | POA: Insufficient documentation

## 2011-03-07 DIAGNOSIS — J45909 Unspecified asthma, uncomplicated: Secondary | ICD-10-CM | POA: Insufficient documentation

## 2011-03-07 LAB — RAPID URINE DRUG SCREEN, HOSP PERFORMED
Amphetamines: NOT DETECTED
Barbiturates: NOT DETECTED
Tetrahydrocannabinol: NOT DETECTED

## 2011-03-07 LAB — MAGNESIUM: Magnesium: 2.1 mg/dL (ref 1.5–2.5)

## 2011-03-07 LAB — COMPREHENSIVE METABOLIC PANEL
Alkaline Phosphatase: 61 U/L (ref 39–117)
BUN: 6 mg/dL (ref 6–23)
Calcium: 9.3 mg/dL (ref 8.4–10.5)
Creatinine, Ser: 0.77 mg/dL (ref 0.4–1.2)
Glucose, Bld: 117 mg/dL — ABNORMAL HIGH (ref 70–99)
Potassium: 3.5 mEq/L (ref 3.5–5.1)
Total Protein: 7.5 g/dL (ref 6.0–8.3)

## 2011-03-07 LAB — DIFFERENTIAL
Basophils Absolute: 0 10*3/uL (ref 0.0–0.1)
Basophils Relative: 0 % (ref 0–1)
Eosinophils Absolute: 0.1 10*3/uL (ref 0.0–0.7)
Eosinophils Relative: 1 % (ref 0–5)
Neutrophils Relative %: 63 % (ref 43–77)

## 2011-03-07 LAB — PHOSPHORUS: Phosphorus: 2.6 mg/dL (ref 2.3–4.6)

## 2011-03-07 LAB — CBC
Platelets: 227 10*3/uL (ref 150–400)
RDW: 13 % (ref 11.5–15.5)
WBC: 10.6 10*3/uL — ABNORMAL HIGH (ref 4.0–10.5)

## 2011-03-07 LAB — URINALYSIS, ROUTINE W REFLEX MICROSCOPIC
Hgb urine dipstick: NEGATIVE
Protein, ur: NEGATIVE mg/dL
Urobilinogen, UA: 1 mg/dL (ref 0.0–1.0)

## 2011-03-07 LAB — POCT CARDIAC MARKERS
CKMB, poc: <1 ng/mL — ABNORMAL LOW (ref 1.0–8.0)
Myoglobin, poc: 51.9 ng/mL (ref 12–200)

## 2011-03-13 LAB — WET PREP, GENITAL: Trich, Wet Prep: NONE SEEN

## 2011-03-13 LAB — GC/CHLAMYDIA PROBE AMP, GENITAL: Chlamydia, DNA Probe: NEGATIVE

## 2011-03-15 LAB — DIFFERENTIAL
Basophils Relative: 1 % (ref 0–1)
Eosinophils Absolute: 0.1 10*3/uL (ref 0.0–0.7)
Neutrophils Relative %: 62 % (ref 43–77)

## 2011-03-15 LAB — URINALYSIS, ROUTINE W REFLEX MICROSCOPIC
Bilirubin Urine: NEGATIVE
Nitrite: NEGATIVE
Protein, ur: 30 mg/dL — AB
Specific Gravity, Urine: 1.014 (ref 1.005–1.030)
Urobilinogen, UA: 1 mg/dL (ref 0.0–1.0)

## 2011-03-15 LAB — POCT CARDIAC MARKERS
CKMB, poc: <1 ng/mL — ABNORMAL LOW (ref 1.0–8.0)
Troponin i, poc: <0.05 ng/mL (ref 0.00–0.09)

## 2011-03-15 LAB — CBC
MCHC: 34.5 g/dL (ref 30.0–36.0)
MCV: 91.1 fL (ref 78.0–100.0)
Platelets: 213 10*3/uL (ref 150–400)

## 2011-03-15 LAB — WET PREP, GENITAL
Trich, Wet Prep: NONE SEEN
Yeast Wet Prep HPF POC: NONE SEEN

## 2011-03-15 LAB — GC/CHLAMYDIA PROBE AMP, GENITAL: Chlamydia, DNA Probe: NEGATIVE

## 2011-03-15 LAB — BASIC METABOLIC PANEL
BUN: 7 mg/dL (ref 6–23)
CO2: 27 mEq/L (ref 19–32)
Chloride: 105 mEq/L (ref 96–112)
Creatinine, Ser: 0.84 mg/dL (ref 0.4–1.2)
Glucose, Bld: 104 mg/dL — ABNORMAL HIGH (ref 70–99)

## 2011-03-15 LAB — URINE MICROSCOPIC-ADD ON

## 2011-03-21 ENCOUNTER — Encounter: Payer: Self-pay | Admitting: *Deleted

## 2011-04-03 ENCOUNTER — Encounter: Payer: Medicaid Other | Admitting: Cardiovascular Disease

## 2011-04-03 LAB — CBC
HCT: 42.7 % (ref 36.0–46.0)
Hemoglobin: 14.7 g/dL (ref 12.0–15.0)
MCHC: 34.3 g/dL (ref 30.0–36.0)
MCV: 91.2 fL (ref 78.0–100.0)
Platelets: 222 10*3/uL (ref 150–400)
RBC: 4.68 MIL/uL (ref 3.87–5.11)
RDW: 13.7 % (ref 11.5–15.5)
WBC: 12.4 10*3/uL — ABNORMAL HIGH (ref 4.0–10.5)

## 2011-04-03 LAB — URINALYSIS, ROUTINE W REFLEX MICROSCOPIC
Bilirubin Urine: NEGATIVE
Glucose, UA: NEGATIVE mg/dL
Ketones, ur: NEGATIVE mg/dL
Leukocytes, UA: NEGATIVE
Nitrite: NEGATIVE
Specific Gravity, Urine: 1.015 (ref 1.005–1.030)
pH: 6 (ref 5.0–8.0)

## 2011-04-03 LAB — RAPID URINE DRUG SCREEN, HOSP PERFORMED
Amphetamines: NOT DETECTED
Barbiturates: NOT DETECTED
Benzodiazepines: NOT DETECTED
Cocaine: NOT DETECTED
Opiates: NOT DETECTED
Tetrahydrocannabinol: NOT DETECTED

## 2011-04-03 LAB — DIFFERENTIAL
Basophils Absolute: 0 10*3/uL (ref 0.0–0.1)
Basophils Relative: 0 % (ref 0–1)
Lymphocytes Relative: 21 % (ref 12–46)
Neutro Abs: 9.1 10*3/uL — ABNORMAL HIGH (ref 1.7–7.7)
Neutrophils Relative %: 73 % (ref 43–77)

## 2011-04-03 LAB — COMPREHENSIVE METABOLIC PANEL
Albumin: 4.5 g/dL (ref 3.5–5.2)
Alkaline Phosphatase: 84 U/L (ref 39–117)
BUN: 7 mg/dL (ref 6–23)
CO2: 25 mEq/L (ref 19–32)
Chloride: 102 mEq/L (ref 96–112)
Creatinine, Ser: 0.8 mg/dL (ref 0.4–1.2)
GFR calc non Af Amer: >60 mL/min (ref >60)
Glucose, Bld: 83 mg/dL (ref 70–99)
Total Bilirubin: 0.7 mg/dL (ref 0.3–1.2)

## 2011-04-03 LAB — URINE MICROSCOPIC-ADD ON

## 2011-04-03 LAB — URINE CULTURE: Colony Count: 50000

## 2011-04-05 LAB — CBC
HCT: 39.4 % (ref 36.0–46.0)
Hemoglobin: 13.4 g/dL (ref 12.0–15.0)
MCHC: 34.1 g/dL (ref 30.0–36.0)
MCV: 91.8 fL (ref 78.0–100.0)
Platelets: 166 10*3/uL (ref 150–400)
RBC: 4.29 MIL/uL (ref 3.87–5.11)
RDW: 13.2 % (ref 11.5–15.5)
WBC: 10.4 10*3/uL (ref 4.0–10.5)

## 2011-04-05 LAB — DIFFERENTIAL
Lymphocytes Relative: 33 % (ref 12–46)
Lymphs Abs: 3.5 10*3/uL (ref 0.7–4.0)
Monocytes Relative: 7 % (ref 3–12)
Neutro Abs: 5.8 10*3/uL (ref 1.7–7.7)
Neutrophils Relative %: 55 % (ref 43–77)

## 2011-04-05 LAB — COMPREHENSIVE METABOLIC PANEL
ALT: 27 U/L (ref 0–35)
Albumin: 3.9 g/dL (ref 3.5–5.2)
BUN: 13 mg/dL (ref 6–23)
Calcium: 9.9 mg/dL (ref 8.4–10.5)
Glucose, Bld: 102 mg/dL — ABNORMAL HIGH (ref 70–99)
Sodium: 138 mEq/L (ref 135–145)
Total Protein: 6.9 g/dL (ref 6.0–8.3)

## 2011-04-05 LAB — URINALYSIS, ROUTINE W REFLEX MICROSCOPIC
Bilirubin Urine: NEGATIVE
Glucose, UA: NEGATIVE mg/dL
Hgb urine dipstick: NEGATIVE
Nitrite: NEGATIVE
pH: 7 (ref 5.0–8.0)

## 2011-04-05 LAB — POCT PREGNANCY, URINE: Preg Test, Ur: NEGATIVE

## 2011-04-11 ENCOUNTER — Encounter: Payer: Self-pay | Admitting: Internal Medicine

## 2011-04-11 ENCOUNTER — Ambulatory Visit (INDEPENDENT_AMBULATORY_CARE_PROVIDER_SITE_OTHER): Payer: Medicaid Other

## 2011-04-11 ENCOUNTER — Ambulatory Visit (INDEPENDENT_AMBULATORY_CARE_PROVIDER_SITE_OTHER): Payer: Medicaid Other | Admitting: Internal Medicine

## 2011-04-11 VITALS — BP 102/74 | HR 77 | Ht 69.0 in | Wt 202.4 lb

## 2011-04-11 DIAGNOSIS — R002 Palpitations: Secondary | ICD-10-CM

## 2011-04-11 HISTORY — DX: Palpitations: R00.2

## 2011-04-11 NOTE — Assessment & Plan Note (Signed)
And has multiple types of palpitations and has documented AI VR. It becomes important to make sure she does not have significant ventricular arrhythmias and she will need assessment for structural heart disease. We have talked many years ago about cardiac MR which he did not pursue.  We'll begin by using an event recorder to try to clarify the mechanism of her palpitations. Raven [name] see her thereafter.

## 2011-04-11 NOTE — Progress Notes (Signed)
HPI: Raven [name] is a 30 y.o. female C. Multiple years ago because of palpitations. At that time she had by event recorder documented AI VR. She had associated shortness of breath. She was seen most recently in our office in 2009. She complained at that time that her symptoms were worse. She is also having problems with her breathing and she been treated with stimulants for this.  She was seen in the emergency room a couple weeks ago for palpitations again. Apparently her heart was beating abnormally for the time that she was there. Unfortunately the emergency room record has no documentation of an arrhythmia There is also no recorded Electrocardiogram.  These come on periodically and they're associated with difficulty breathing. She knows that they are also exacerbated by stress, cold, and she knows that she has a significant anxiety disorder for which psychiatric help his scheduled to begin next week. He is being treated currently with lorazepam.  Echo cardiogram 2006 by [facility]  The cardiology was normal   Current Outpatient Prescriptions  Medication Sig Dispense Refill  . beclomethasone (QVAR) 80 MCG/ACT inhaler Inhale 1 puff into the lungs 2 (two) times daily.        Marland Kitchen LORazepam (ATIVAN) 1 MG tablet Take 1 mg by mouth every 8 (eight) hours as needed.        Marland Kitchen DISCONTD: fluticasone (FLONASE) 50 MCG/ACT nasal spray 2 sprays by Nasal route as needed.        Marland Kitchen DISCONTD: omeprazole (PRILOSEC) 20 MG capsule Take 20 mg by mouth daily.          No Known Allergies  Past Medical History  Diagnosis Date  . Unspecified asthma   . Depression   . GERD with stricture     hx of  . Achalasia     Past Surgical History  Procedure Date  . Tonsillectomy   . Adenoidectomy   . Breast surgery     tumor removed right breast  . Esophagogastroduodenoscopy     with esophageal dilation    Family History  Problem Relation Age of Onset  . Heart disease Father   . Stroke Mother     History    Social History  . Marital Status: Single    Spouse Name: N/A    Number of Children: N/A  . Years of Education: N/A   Occupational History  . student    Social History Main Topics  . Smoking status: Current Everyday Smoker -- 0.3 packs/day    Types: Cigarettes  . Smokeless tobacco: Not on file   Comment: HAS CUT BACK  . Alcohol Use: Yes     on occasion  . Drug Use: Not on file  . Sexually Active: Not on file   Other Topics Concern  . Not on file   Social History Narrative  . No narrative on file    Fourteen point review of systems was negative except as noted in HPI and PMH   PHYSICAL EXAMINATION  Blood pressure 102/74, pulse 77, height 5\' 9"  (1.753 m), weight 202 lb 6.4 oz (91.808 kg).   Well developed and nourished Is somewhat anxious. Young [place] American female in no acute distress HENT normal; Her eyelashes would have fallen off Neck supple with JVP-flat Carotids brisk and full without bruits Back without scoliosis or kyphosis Clear Regular rate and rhythm, no murmurs or gallops Abd-soft with active BS without hepatomegaly or midline pulsation Femoral pulses 2+ distal pulses intact No Clubbing cyanosis edema Skin-warm and  dry LN-neg submandibular and supraclavicular A & Oriented CN 3-12 normal  Grossly normal sensory and motor function Affect engaging . Sinus rhythm at 77 Intervals 0.16 5.09/29 Axis is leftward at - T. Poor R wave progression

## 2011-04-11 NOTE — Patient Instructions (Signed)
Your physician recommends that you schedule a follow-up appointment in: 6 WEEKS WITH DR Graciela Husbands Your physician has recommended that you wear an event monitor. Event monitors are medical devices that record the heart's electrical activity. Doctors most often [place] these monitors to diagnose arrhythmias. Arrhythmias are problems with the speed or rhythm of the heartbeat. The monitor is a small, portable device. You can wear one while you do your normal daily activities. This is usually used to diagnose what is causing palpitations/syncope (passing out).

## 2011-05-08 ENCOUNTER — Other Ambulatory Visit: Payer: Self-pay | Admitting: Internal Medicine

## 2011-05-13 ENCOUNTER — Other Ambulatory Visit: Payer: Self-pay | Admitting: Otolaryngology

## 2011-05-13 DIAGNOSIS — R1313 Dysphagia, pharyngeal phase: Secondary | ICD-10-CM

## 2011-05-13 DIAGNOSIS — E041 Nontoxic single thyroid nodule: Secondary | ICD-10-CM

## 2011-05-13 NOTE — Assessment & Plan Note (Signed)
[facility]                            CARDIOLOGY OFFICE NOTE   CLYTIE, SHETLEY                     MRN:          [id number]  DATE:09/20/2008                            DOB:          08/01/1980    [name] is a 30 year old African American female patient who was seen  by Dr. Graciela Husbands in November 2006 for accelerated idioventricular rhythm,  slow V-tach, and shortness of breath.  At that time, she was 7 months  pregnant and had a normal heart by echo.  He had recommended stopping  her albuterol and waiting until her pregnancy was over and possibly  doing an MRI scanning to see if there was any internal cardiac focus  that might be giving rise to this such as sarcoidosis.   The patient has not been seen in 3 years.  She says she has always had  the palpitations, but they have been worse over the past 2 weeks.  Her  breathing has worsened, and she has restarted ProAir over the past 2  weeks.  She says it is worse at night.  When she lays down, she feels  like she cannot breathe and her heart skips.  She does not know that it  races, but complains mostly of skipping followed by sharp shooting chest  pains.  She has not had any dizziness or presyncope with this.  She says  doing any heavy lifting or strenuous activity does make it worse.   CURRENT MEDICATIONS:  ProAir 2 puffs b.i.d., ear drops, and Protonix 40  mg daily.   PAST MEDICAL HISTORY:  Significant for depression.  She has had a right  breast tumor removed.  She fell and fractured her left ankle on Saturday  and is walking with a cane.   SOCIAL HISTORY:  She has a 80-year-old baby with her today.  She is  unmarried.  She works in housekeeping, and she continues to smoke about  5 cigarettes a day.   PHYSICAL EXAMINATION:  GENERAL:  This is a 30 year old African American  female in no acute distress.  VITAL SIGNS:  Blood pressure 117/82, pulse 109, weight 206.  NECK:  Without JVD, HJR,  bruit, thyroid enlargement.  LUNGS:  Decreased breath sounds with scattered wheezes and rhonchi.  HEART:  Regular rate and rhythm at 109 beats per minute.  Normal S1 and  S2.  No murmur, rub, bruit, thrill, or heave noted.  ABDOMEN:  Soft without organomegaly, masses, lesions, or abnormal  tenderness.  EXTREMITIES:  Without cyanosis, clubbing, or edema.  She has good distal  pulses.   EKG:  Sinus tach at 109 beats per minute.   IMPRESSION:  1. Palpitations worsened in the past 2 weeks since albuterol resumed.  2. History of accelerated idioventricular rhythm/slow ventricular      tachycardia with,      a.     Interval being 100 and cycle length at 500 msec.      b.     Interval being about 400 msec during a pregnancy in 2006.  3. Shortness of breath, question secondary to arrhythmia  versus asthma      exacerbation.  4. Asthma.  5. Normal heart by echocardiography.   PLAN:  We will put an event recorder on this patient's.  I have asked  her to stop smoking completely and cut back on her albuterol if at all  possible.  We will get her in to see Dr. Graciela Husbands as soon as possible.  She had a TSH recently drawn by her gynecologist that she stays was  normal.      Jacolyn Reedy, PA-C  Electronically Signed      Luis Abed, MD, [facility]  Electronically Signed   ML/MedQ  DD: 09/20/2008  DT: 09/21/2008  Job #: [id number]

## 2011-05-13 NOTE — Assessment & Plan Note (Signed)
[facility]                             PULMONARY OFFICE NOTE   Raven [name], Raven [name]                     MRN:          [id number]  DATE:05/18/2007                            DOB:          08/01/1980    PULMONARY NEW PATIENT EVALUATION:   PRIMARY CARE PHYSICIAN:  Willaim Sheng, CNM, FNP, at [facility].   HISTORY:  A 30 year old black female who has been complaining of  intermittent cough and dyspnea off and on for the last several months.  She previously had trouble with dyspnea during pregnancy and tried  albuterol, which helped her breathing but made her palpitations worse.  Since she delivered her baby over a year ago, she has not needed any  albuterol but has continued to have intermittent paroxysms of coughing  and dyspnea, especially when she tries to lie down at night.  Interestingly, after she gets comfortable, she typically sleeps without  nocturnal awakening.   She denies any early morning exacerbations of cough or excess morning  sputum production, fever, chills, sweats, orthopnea, PND, leg swelling,  chest pain of any kind, active sinus or reflux symptoms.   PAST MEDICAL HISTORY:  Significant for a diagnosis of possible  pericarditis in 1999.  She remembers taking ibuprofen for this but who  treated her.  She was also told that she had reflux by an ENT doctor but  could not remember who this was and this evaluation was just done within  the last several months.   ALLERGIES:  None known.   MEDICATIONS:  None regularly.  She had been on Prilosec before but did  not think it helped any of her symptoms.   SOCIAL HISTORY:  She smokes a half pack per day and has done so most of  her adult life.  She denies any unusual travel, pet, or hobby exposure.   FAMILY HISTORY:  Reported in detail on the work sheet and negative,  except as outlined above.   REVIEW OF SYSTEMS:  Taken in detail on the work sheet and essentially  negative except as outlined above.   PHYSICAL EXAMINATION:  GENERAL:  This is a pleasant, ambulatory, hoarse  black female in no acute distress.  VITAL SIGNS:  She is afebrile with normal vital signs.  HEENT:  Remarkably unremarkable.  Her oropharynx is clear with no  evidence of excessive postnasal drainage or cobblestoning.  NECK:  Supple without cervical adenopathy or tenderness.  Trachea is  midline.  No thyromegaly.  LUNGS:  Perfectly clear bilaterally to auscultation and percussion.  HEART:  Regular rhythm without murmur, rub or gallop.  ABDOMEN:  Soft, benign.  EXTREMITIES:  Warm without calf tenderness, clubbing, cyanosis or edema.   IMPRESSION:  Upper airway coughing is either postnasal drip syndrome,  reflux, or both.  The fact that she has done this well without albuterol  and has no nocturnal awakening, is strongly against asthma.   The recommendation is to start Zegerid 40 mg nightly (samples given for  the next 45 days) and to return here for a set of PFTs and a chest x-ray  at the end of six weeks.  I have advised her on a GERD diet along with  avoiding menthol.  I recommended that she use Delsym for cough  suppression at a dose of 2 teaspoons every 12 hours p.r.n.  We will  certainly see her in the meantime if her cough or dyspnea exacerbate on  this regimen.     Charlaine Dalton. Sherene Sires, MD, [facility]  Electronically Signed    MBW/MedQ  DD: 05/18/2007  DT: 05/19/2007  Job #: [id number]   cc:   Selena Lesser, FNP

## 2011-05-16 NOTE — Op Note (Signed)
[place]  Patient:    Raven [name], Raven [name] Visit Number: [id number] MRN: [id number]          Service Type: DSU Location: [facility] Attending Physician:  Fortino Sic Dictated by:   Marnee Spring. Wiliam Ke, M.D. Proc. Date: 08/20/01 Adm. Date:  08/20/2001   CC:         Jarome Matin, M.D.   Operative Report  PREOPERATIVE DIAGNOSIS:  Breast mass.  POSTOPERATIVE DIAGNOSIS:  Breast mass.  OPERATION PERFORMED:  Excision of right breast mass.  SURGEON:  Marnee Spring. Wiliam Ke, M.D.  ASSISTANT:  None.  ANESTHESIA:  General by hospital.  PROCEDURE:  Under good general endotracheal anesthesia, the skin of the right breast was prepped and draped in usual manner.  An incision was made in the areolar margin superiorly.  The mass was dissected free from surrounding tissue.  Hemostasis was obtained with electrocautery current.  The wound was closed with subcutaneous 3-0 Vicryl and subcuticular 4-0 Vicryl.  Steri-Strips were applied.  Estimated blood loss was minimal.  The patient received no blood and left the operating room in stable condition after sponge and needle counts were verified. Dictated by:   Marnee Spring. Wiliam Ke, M.D. Attending Physician:  Fortino Sic DD:  08/20/01 TD:  08/21/01 Job: [id number]

## 2011-05-16 NOTE — Letter (Signed)
October 18, 2008    Teena Irani. Sampson Goon, MD  [facility], [place] [address]   RE:  Raven [name], Raven [name]  MRN:  [id number]  /  DOB:  08/01/1980   Dear Onalee Hua,   This letter is to introduce you to Guinevere Ferrari who is interested in a  further opinion regarding palpitations.   I saw her at the age of 44 in the year 2006, for palpitations.  Her  palpitations  were at least on one occasion associated with a slow  ventricular rhythm at a rate of about 105 beats per minute.  They are  occasionally more closely coupled  wide beats at about 360 milliseconds  and it was my impression that these were ventricular in origin.   She had normal left ventricular function and she was referred for MRI  scan and to make sure there was nothing structurally abnormal with her  heart This did not occur and then we did not see her again for 3 years.  The palpitations recurred over the last number of weeks accompanied by  worsening breathing.  She was started on inhaled breathing therapy  again, and I should note that in 2006, there are some questions to  whether inhaled sympathomimetics had contributed to the rhythm issues.  She has some orthopnea.  She was seen by Jerral Bonito, one of my partners,  and a event recorder was recommended.  We used life watch and there are  number of manual activations for her symptoms which she described as  being quite typical.  On review of these strips, no arrhythmias were  identified, some variation in rate was noted with cycle length as short  as 550 milliseconds.   As I reviewed this data with her and specifically the strips at this  time failed to show any ventricular rhythms, it was quite frustrating  for her.  She says that these symptoms which she was currently suffering  were identical to those 3 years ago when the white complex rhythm was  identified.   Her medications currently include  the Provera and Protonix.   PHYSICAL EXAMINATION:  VITAL SIGNS:  Her blood pressure is 118/68, her  pulse was 80, her weight was 208 which was relatively stable, but up  about 18 pounds in the last 3 years.  NECK:  Veins were flat.  LUNGS:  Clear.  HEART:  Sounds were regular without murmurs or gallops.  ABDOMEN:  Soft.  EXTREMITIES:  Without edema.   A standard 12-lead taken on September 20, 2008, was notable for a narrow  QRS tachycardia at a cycle length of 510 milliseconds, they were  preceded by P-waves, notably not biphasic in lead V1, but they had not  been biphasic in 2006, either at a rate of 99 beats per minute.  There  are prominent P-waves in the inferior leads.   Onalee Hua, thank you very much in anticipation for your insights for Mrs.  Clelia Croft.  Hopefully, if I have missed something, you will clarify that for  her.    Sincerely,      Duke Salvia, MD, [facility]  Electronically Signed    SCK/MedQ  DD: 10/18/2008  DT: 10/19/2008  Job #: [id number]

## 2011-05-16 NOTE — Op Note (Signed)
   NAME:  Raven [name], Raven [name]                        ACCOUNT NO.:  [id number]   MEDICAL RECORD NO.:  [id number]                   PATIENT TYPE:  AMB   LOCATION:  DSC                                  FACILITY:  [facility]   PHYSICIAN:  Kathy Breach, M.D.                   DATE OF BIRTH:  08/01/1980   DATE OF PROCEDURE:  01/18/2003  DATE OF DISCHARGE:                                 OPERATIVE REPORT   PREOPERATIVE DIAGNOSES:  Chronic hyperplastic tonsils and adenoids.   OPERATION:  Adenotonsillectomy.   POSTOPERATIVE DIAGNOSES:  Chronic hyperplastic tonsils and adenoids.   SURGEON:  Kathy Breach, M.D.   DESCRIPTION OF PROCEDURE:  With the patient under general orotracheal  anesthesia, a Crowe-Davis mouth gag is inserted into the patient and put in  the [facility] position.  The tonsils were 4+ enlarged.  The soft palate and uvula  were normal in configuration.  The nasopharynx was visualized by mirror, and  there was significant adenoid tissue present.  A red rubber catheter is  placed through the left nasal chamber and used to elevate the soft palate.  A mirror examination near the pharynx revealed markedly hyperplastic  eustachian tube, orifice, tori, almost with about 5 mm of space between them  medially.  Filling the nasopharynx between the hyperplastic eustachian tube  tori was prominent adenoid tissue.  Under mirror visualization with suction  cautery, adenoid tissue was gradually ablated by coagulation.  The left  tonsil was then grasped at the pole and with electrical dissection removed  from the fossa.  Hemostasis was made complete with electrocauterization.  The right tonsil was removed in a similar fashion.  The patient's tonsils  were 4+ enlarged and deeply invasive tonsillar fossae bilaterally.  Blood  loss for the procedure was 50 cubic centimeters in the suction cannister.  The patient tolerated the procedure well and was taken to the recovery room  in stable general  condition.                                                Kathy Breach, M.D.    Venia Minks  D:  01/18/2003  T:  01/18/2003  Job:  [id number]

## 2011-05-16 NOTE — Discharge Summary (Signed)
NAMEAGNES, Raven [name]              ACCOUNT NO.:  [id number]   MEDICAL RECORD NO.:  [id number]          PATIENT TYPE:  INP   LOCATION:  9305                          FACILITY:  [facility]   PHYSICIAN:  Osborn Coho, M.D.   DATE OF BIRTH:  08/01/1980   DATE OF ADMISSION:  02/13/2006  DATE OF DISCHARGE:  02/14/2006                                 DISCHARGE SUMMARY   ADMITTING DIAGNOSES:  1.  Delayed postpartum hemorrhage.  2.  Anemia.  3.  Possible retained products of conception.   DISCHARGE DIAGNOSES:  1.  Delayed postpartum hemorrhage.  2.  Anemia.  3.  Possible retained products of conception.   PROCEDURE:  1.  D&E.  2.  Packed red blood cell transfusion x5 units.   HOSPITAL COURSE:  [name] is a 30 year old single black female, who  presented postpartum from a spontaneous vaginal delivery with complaints of  heavy vaginal bleeding since 8 p.m. and also passing large clots.  She  arrived via EMS to maternity admissions.  She complained of dizziness with  standing and also reports normal lochia prior to 8 p.m.  Her hemoglobin at  discharge was 8.1.  She denied fever and did receive the Methergine series  during her postpartum stay 2007.  Her postpartum stay was unremarkable other  than receiving the Methergine series.  Her hemoglobin was 6, hematocrit was  17.1, white blood cell count was 13.3 and platelets were 2,000.  Her PT was  13.4, which was normal.  PTT was 28, which was normal.  Complete metabolic  profile was within normal limits.  During the patient's pelvic examination,  moderated bright red bleeding.  Once the clot was removed, her bleeding  slowed.  Orthostatic vital signs were unstable and with lying the patient's  blood pressure was 141/76 with pulse 84, sitting 144/70 with pulse 80 and  standing blood pressure 112/80 with pulse 126.  Ultrasonography showed  thickened complex endometrium with vascularity in the lower uterine segment  concerning for retained  products.  Elsie Ra, Certified Nurse Midwife  examined the patient upon arrival to maternity admissions and per consult  with Dr. Su Hilt.  On the morning of February 17, the patient was feeling  better.  She reports a small to moderate amount of bleeding during the  night, but was starting to have more cramping in the morning.  Her vital  signs were stable.  The patient's hemoglobin in the morning, after receiving  the blood transfusions during the night, was 8 and her hematocrit was 23.1.  It was decided that the patient would have a suction D&E, which she  tolerated well.  Estimated blood loss was approximately 25 mL.  The patient  was sent to the recovery room in good condition.  She was transferred to the  floor to receive 2 more units of packed red blood cells.  By that night, her  hemoglobin had increased to 10 and hematocrit was 28.5.  Her vital signs  were stable and the patient was desiring to go home.  She was deemed to have  received the full benefit  of her hospital stay.  Discharge followup will  occur at [facility] in 2 weeks.   DISCHARGE INSTRUCTIONS:  Per [facility] handout.   DISCHARGE MEDICATIONS:  1.  Motrin 600 mg 1 p.o. q.6h. p.r.n. pain.  2.  Tylox 1-2 p.o. q.3-4h. p.r.n. pain.      Cam Hai, C.N.M.      Osborn Coho, M.D.  Electronically Signed    KS/MEDQ  D:  02/16/2006  T:  02/17/2006  Job:  [id number]

## 2011-05-16 NOTE — Op Note (Signed)
[name], [name]              ACCOUNT NO.:  [id number]   MEDICAL RECORD NO.:  [id number]          PATIENT TYPE:  INP   LOCATION:  9305                          FACILITY:  [facility]   PHYSICIAN:  Osborn Coho, M.D.   DATE OF BIRTH:  08/01/1980   DATE OF PROCEDURE:  02/14/2006  DATE OF DISCHARGE:  02/14/2006                                 OPERATIVE REPORT   PREOPERATIVE DIAGNOSES:  1.  Anemia.  2.  Retained products of conception.   POSTOPERATIVE DIAGNOSES:  1.  Anemia.  2.  Retained products of conception.   PROCEDURE:  Suction dilation and curettage.   SURGEON:  Osborn Coho, M.D.   ANESTHESIA:  MAC.   FLUIDS:  500 mL.   ESTIMATED BLOOD LOSS:  Approximate 825 mL.   URINE OUTPUT:  Approximately 50 mL via straight catheter prior to procedure.   COMPLICATIONS:  None.   FINDINGS:  Minimal POC sent to pathology.   DESCRIPTION OF PROCEDURE:  The patient was taken to the operating room after  the risks, benefits and alternatives were reviewed with the patient. The  patient verbalized understanding.  Consent signed and witnessed.  The  patient was placed under MAC anesthesia, and prepped and draped in the  normal sterile fashion.  A bivalve speculum was placed in the patient's  vagina, and the anterior lip of the cervix was grasped with a single-tooth  tenaculum.  A total of 10 mL was used to administer a paracervical block  with 2% lidocaine.  Uterus was sounded to 9 cm, and a suction curettage was  performed and a gritty texture noted.  Suction curettage was performed again  to remove any remaining debris.  There was some light bleeding at the end of  the procedure from the uterus and a dose  of Methergine was administered and 20 units of Pitocin was placed in the IV  fluids.  The uterus was firm.  There was good hemostasis at the tenaculum  site.  Count was correct.  All instruments were removed.  The patient  tolerated procedure well and was transferred to the recovery  room in good  condition.      Osborn Coho, M.D.  Electronically Signed     AR/MEDQ  D:  02/14/2006  T:  02/15/2006  Job:  [id number]

## 2011-05-16 NOTE — H&P (Signed)
NAMETIWANDA, THREATS              ACCOUNT NO.:  [id number]   MEDICAL RECORD NO.:  [id number]          PATIENT TYPE:  INP   LOCATION:  9117                          FACILITY:  [facility]   PHYSICIAN:  Hal Morales, M.D.DATE OF BIRTH:  08/01/1980   DATE OF ADMISSION:  02/11/2006  DATE OF DISCHARGE:                                HISTORY & PHYSICAL   HISTORY OF PRESENT ILLNESS:  Ms. Tovey is a 30 year old gravida 3, para 1-0-1-  1 who is admitted at 60 and 4/7ths weeks gestation with complaints of  regular painful uterine contractions and questionable leakage of fluid.  The  patient denies any bleeding, but has had some bloody mucous.  The patient  reports her fetus is moving normally.  The patient reports the onset of  contractions at 0100 on February 11, 2006; however, they increased in  intensity and frequency at 11:30 on February 11, 2006.  The patient was seen  in the [facility] office the morning prior to admission with a cervical exam of 2  cm and membranes wept at that time.  The patient reports that she felt that  she may have had a small trickle at approximately 3 p.m.; however, no big  gush of fluid.   The patient's pregnancy is remarkable for -  1.  Questionable LMP.  2.  Tobacco use.  3.  History of depression.  4.  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  5.  Positive group B strep.  6.  History of heart murmur with no SBE prophylaxis and cardiac conduction      defect.  7.  Molluscum contagiosum.  8.  Father of the baby deceased.   PRENATAL LABORATORIES:  Hemoglobin 10.8, platelets 180,000, blood type O  positive, antibody screen negative, sickle cell trait again positive, RPR  nonreactive, rubella titer immune, hepatitis B surface antigen negative, HIV  nonreactive.  Gonorrhea and Chlamydia negative x2.  Pap smear in December  2005 within normal limits.  Cystic fibrosis was negative.  Increased Down  syndrome risk on quad screen.  Positive group B strep at 33 weeks.  Twenty-  seven  week hemoglobin 10.1.  Glucola negative.   HISTORY OF PRESENT ILLNESS:  The patient entered care at [redacted] weeks gestation.  The patient had an ultrasound done at [facility] prior to entering care with an [facility] of February 07, 2006 established.  Ultrasound repeated at 14 weeks agreed with LMP.  The patient's last  menstrual period, questionable March 2006.  The patient with no elevated  blood pressures throughout her pregnancy.  Again, positive sickle cell  carrier; however, father of the baby is deceased.  Therefore, no information  is available regarding paternal status.  Father of the baby deceased in July 20, 2005.  Therefore, the patient with increased stress secondary to that.  Down syndrome risk 1 in 15 based on quad screen.  The patient was referred  to Dr. Sherrie George at [facility] for genetic counseling and ultrasound, and  the patient declined amniocentesis.  The patient with complaints of pelvic  pressure and irregular uterine contractions  at 33 weeks.  Fetal fibronectin  obtained at that time was negative, as well as gonorrhea and Chlamydia were  negative, and positive group B strep.  Molluscum contagiosum noted at [redacted]  weeks gestation.  Otherwise, the patient's pregnancy has been unremarkable.   OBSTETRIC HISTORY:  1.  In 1999, an SAB at [redacted] weeks gestation status post D&C without      complications.  2.  Pregnancy #2 in June of 2000 - spontaneous vaginal delivery of a viable      female infant, 6 pounds, 15 ounces at [redacted] weeks gestation, a 10-hour      labor.  No complications.  3.  Present.   GYNECOLOGIC HISTORY:  1.  The patient with a D&C.  2.  The patient also with a right breast benign tumor removed in 2001.  3.  The patient with a history of abnormal Pap in 2001 with colposcopy      evaluation; however, Paps within normal limits since.  4.  The patient with history of Chlamydia in 2005, which was treated;      however, STD history is  otherwise negative.  5.  The patient with regular monthly menses.  6.  The patient used NuvaRing until March of 2006.   MEDICAL HISTORY:  1.  History of a heart murmur.  Does not require SBE prophylaxis.  2.  A conduction defect, which the patient is scheduled to have ablated      after delivery.  3.  Positive sickle cell trait.  4.  History of depression.  The patient without medications for depression.   SURGICAL HISTORY:  1.  D&C in 2001.  2.  D&C in 1999.  3.  Tonsillectomy in 2003.   MEDICATIONS:  Prenatal vitamins.   ALLERGIES:  No known drug allergies.   FAMILY HISTORY:  Paternal grandmother with coronary artery disease.  Father  with hypertension.   GENETIC HISTORY:  The patient's sister and daughter with sickle cell trait.  Father of the baby's father with spina bifida.  Otherwise, negative.   SOCIAL HISTORY:  The patient is single.  Again, as noted above, father of  the baby is deceased as of July 19, 2005.  The patient has 13 years of education  and works as a Engineer, drilling.  The patient uses tobacco, approximately  one-half pack per day.  The patient denies alcohol or street drug use.  The  patient's domestic violence screen is negative.  The patient's mother and  sister are involved and supportive at the present time.  The patient  actually does not profess to a particular religion.   REVIEW OF SYSTEMS:  Typical of one at term pregnancy a day and in active  labor.   PHYSICAL EXAMINATION:  VITAL SIGNS:  The patient is afebrile.  Vital signs  are stable.  Blood pressure on admission was 158/86; however, the patient  with pain at the present time.  Repeat blood pressure within normal limits.  HEENT:  Within normal limits.  LUNGS:  Clear.  HEART:  Regular rate and rhythm.  BREASTS:  Soft.  ABDOMEN:  Soft, gravid, nontender.  Fetus noted to be vertex to Leopold's maneuvers and longitudinal lie.  EFM.  Fetal heart rate 140s with positive  variability.  Occasional  mild variable deceleration, non-repetitive.  Uterine contractions noted every 3-5 minutes and moderate to palpation.  PELVIC:  No pooling of amniotic fluid noted in the vaginal vault.  Bulging  bag of membranes is noted.  A small amount of bloody mucous is noted in the  vault.  Cervix 7 cm, completely effaced and -1 station.  EXTREMITIES:  No edema and negative Homans bilaterally.   ASSESSMENT:  1.  Intrauterine pregnancy at 40 and 4/7ths weeks gestation.  2.  In active labor.  3.  Positive group B strep.   PLAN:  1.  The patient will be admitted to the birthing suites per consult with Dr.      Dierdre Forth.  2.  Routine C.N.M. orders.  3.  Ampicillin for positive group B strep and Stadol for pain relief.      Rhona Leavens, CNM      Hal Morales, M.D.  Electronically Signed    NOS/MEDQ  D:  02/11/2006  T:  02/11/2006  Job:  [id number]

## 2011-05-16 NOTE — H&P (Signed)
Raven [name]              ACCOUNT NO.:  [id number]   MEDICAL RECORD NO.:  [id number]          PATIENT TYPE:  INP   LOCATION:  9305                          FACILITY:  [facility]   PHYSICIAN:  Osborn Coho, M.D.   DATE OF BIRTH:  08/01/1980   DATE OF ADMISSION:  02/13/2006  DATE OF DISCHARGE:                                HISTORY & PHYSICAL   Raven [name] is a 30 year old gravida 3, para 2-0-1-2, who presents to MAU 2  days postpartum with complaint of heavy vaginal bleeding and passage of  clots as well as lightheaded and dizziness.  The patient underwent  spontaneous vaginal delivery February 11, 2006, without complications.  The  patient with some heavier bleeding immediately postpartum that was well  controlled with the Methergine series.  The patient reports that at  approximately 7-8 p.m. tonight, she got up to use the restroom and began  having heavy vaginal bleeding and passage of multiple large clots.  The  patient reports that the bleeding continued, and she began feeling  lightheaded and dizzy.  The patient denies any fever.  The patient reports  cramping only with the heavy bleeding.  The patient denies other significant  pain.  The patient reports normal GI and GU function.  The patient denies  any perineal pain.   CURRENT MEDICATIONS:  Prenatal vitamins and Motrin.   ALLERGIES:  No known drug allergies.   OBSTETRICAL HISTORY:  1.  In 1999, spontaneous abortion, status post D&C, no complications.  2.  In 2000, spontaneous vaginal delivery at 1-2 weeks.  No complications.  3.  February 11, 2006, spontaneous vaginal delivery at 39 weeks.   GYNECOLOGIC HISTORY:  The patient with a history of abnormal Pap in the  past, status post colpo with follow up Paps within normal limits.  History  of Chlamydia in the remote past, treated.  The patient reports regular  menses every 28 days prior to pregnancy.   MEDICAL HISTORY:  The patient with a history of depression.  No  medications  currently.  The patient with a history of heart murmur resolved per patient;  however, she reports she has had a cardiac evaluation and has surgery  planned for correction of this in the near future.   SURGERIES:  1.  In 1999, D&C.  2.  In 2001, right breast benign tumor removed.  3.  In 2003, tonsillectomy.   HOSPITALIZATIONS:  Childbirth x2.   SOCIAL HISTORY:  The patient reports occasional tobacco use; however, she  denies drug or alcohol use.  Father of the baby deceased 08-02-05.  The  patient with a good support system and her family available.   PHYSICAL EXAMINATION:  VITAL SIGNS:  Temperature 98 degrees, pulse 80,  respirations 16, blood pressure 149/59.  Orthostatic vital signs reveal  lying blood pressure 141/76, heart rate 84, sitting 11914, heart rate 86,  standing 112/80, heart rate 126.  GENERAL APPEARANCE:  The patient in no apparent distress.  SKIN:  Warm and dry.  The patient slightly pale.  HEENT:  Within normal limits.  LUNGS:  Clear.  HEART:  Regular rate and rhythm.  NECK:  Negative.  BREASTS:  Soft.  BACK:  Negative, CVA tenderness.  ABDOMEN:  Soft and nontender.  Uterine fundus is noted to be firm and  nontender to below umbilicus.  PELVIC EXAM:  External genitalia, a large amount of blood and blood clot  noted on the external genitals; however, no lacerations or abnormalities  noted.  Laceration repair of the perineum intact.  Vagina with a large  amount of clot noted in the vaginal vault and moderate amount of bright red  blood noted in the vault.  Clot removed with ring forceps.  Cervix intact  with no lacerations noted.  Cervical os 1-2 cm dilated and clot noted at the  cervix which was removed with ring forceps.  Uterus is firm, nontender to  below umbilicus.  Adnexa, no masses or tenderness bilaterally.  Rectum is  deferred.  EXTREMITIES:  1+ edema, negative Homans, deep tendon reflexes 2+, no clonus.   LAB WORK:  White count:  WBC  13.3, hemoglobin 6.0 down from 8.1, 24 hours  postdelivery, hematocrit 17.1, and platelets 200,000.  PT 13.4, PTT 28.  Complete metabolic panel within normal limits.  Ultrasound done reveals  thickened complex endometrial vascularity in the lower uterine segment  concerning for retained products of conception.   ASSESSMENT:  1.  Postpartum hemorrhage, late.  2.  Anemia with hemodynamic instability.  3.  Possible retained products of conception.   PLAN:  1.  Consult was obtained with Dr. Su Hilt.  The patient will be admitted to      third floor.  2.  Risks, benefits, and alternatives of blood transfusion was discussed      with the patient to correct hemodynamic instability.  The patient agrees      to proceed.  The patient will be transfused 3 units of packed cells.  3.  The patient's bleeding will continue to be observed as it had decreased      from time of admission to after her ultrasound, and bleeding will be      reevaluated in the morning for possible D&C.      Raven [name], CNM      Osborn Coho, M.D.  Electronically Signed    NOS/MEDQ  D:  02/14/2006  T:  02/14/2006  Job:  [id number]

## 2011-05-19 ENCOUNTER — Ambulatory Visit (INDEPENDENT_AMBULATORY_CARE_PROVIDER_SITE_OTHER): Payer: Medicaid Other | Admitting: Internal Medicine

## 2011-05-19 ENCOUNTER — Encounter: Payer: Self-pay | Admitting: Internal Medicine

## 2011-05-19 VITALS — BP 124/82 | HR 79 | Ht 69.0 in | Wt 198.4 lb

## 2011-05-19 DIAGNOSIS — I493 Ventricular premature depolarization: Secondary | ICD-10-CM

## 2011-05-19 DIAGNOSIS — I4949 Other premature depolarization: Secondary | ICD-10-CM

## 2011-05-19 MED ORDER — NADOLOL 40 MG PO TABS: 40.0000 mg | ORAL_TABLET | Freq: Every day | ORAL | Status: DC

## 2011-05-19 MED ORDER — ATENOLOL 50 MG PO TABS: 50.0000 mg | ORAL_TABLET | Freq: Every day | ORAL | Status: DC

## 2011-05-19 MED ORDER — METOPROLOL SUCCINATE ER 50 MG PO TB24: 50.0000 mg | ORAL_TABLET | Freq: Every day | ORAL | Status: DC

## 2011-05-19 NOTE — Assessment & Plan Note (Signed)
Is noted some of these are associated with PVCs; others associated with sinus rhythm.

## 2011-05-19 NOTE — Patient Instructions (Signed)
You have been given prescriptions for 3 different medications to try in any order: 1) Atenolol 50mg  once daily. 2) Nadolol 40mg  once daily. 3) Metoprolol succ 50mg  once daily.  Your physician recommends that you schedule a follow-up appointment in: 3 months.

## 2011-05-19 NOTE — Assessment & Plan Note (Addendum)
The patient has symptomatic PVCs. We will begin her on beta blockers. We will undertake a trial including atenolol Corgard and metoprolol. If these are not helpful or associated with significant side effects we will try her on calcium blockers. Thereafter we would try her on antiarrhythmic therapy. She has significant palpitations in addition associated with sinus rhythm and so trying to avoid catheter ablation as she will have residual symptoms undoubtedly would be a goal  We spent more than 25 minutes reviewing the results of the monitor and treatment options.

## 2011-05-19 NOTE — Progress Notes (Signed)
  HPI  Raven [name] is a 30 y.o. female Seen in followup for palpitations. At our last visit in April we gave her that record. Multiple episodes were recorded. One of these demonstrated sinus tachycardia. The others demonstrated PVCs with one couplet and frequencies ranging from sporadic to quadrigeminy.. These symptoms are annoying. There is some lightheadedness and shortness of breath. There admitted by cold and by stress. They further aggravate her tendency towards anxiety  Past Medical History  Diagnosis Date  . Unspecified asthma   . Depression   . GERD with stricture     hx of  . Achalasia     Past Surgical History  Procedure Date  . Tonsillectomy   . Adenoidectomy   . Breast surgery     tumor removed right breast  . Esophagogastroduodenoscopy     with esophageal dilation    Current Outpatient Prescriptions  Medication Sig Dispense Refill  . beclomethasone (QVAR) 80 MCG/ACT inhaler Inhale 1 puff into the lungs 2 (two) times daily.        Marland Kitchen LORazepam (ATIVAN) 1 MG tablet Take 1 mg by mouth every 8 (eight) hours as needed.          No Known Allergies  Review of Systems negative except from HPI and PMH  Physical Exam Well developed and well nourished in no acute distress HENT normal E scleral and icterus clear Neck Supple JVP flat; carotids brisk and full Clear to ausculation Regular rate and rhythm, no murmurs gallops or rub Soft with active bowel sounds No clubbing cyanosis and edema Alert and oriented, grossly normal motor and sensory function Skin Warm and Dry  If it recorder demonstrated PVCs as noted previously. 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  Assessment and  Plan

## 2011-05-21 ENCOUNTER — Inpatient Hospital Stay: Admission: RE | Admit: 2011-05-21 | Payer: Medicaid Other | Source: Ambulatory Visit

## 2011-06-02 ENCOUNTER — Other Ambulatory Visit: Payer: Medicaid Other

## 2011-06-02 ENCOUNTER — Inpatient Hospital Stay: Admission: RE | Admit: 2011-06-02 | Payer: Medicaid Other | Source: Ambulatory Visit

## 2011-06-10 ENCOUNTER — Emergency Department (HOSPITAL_COMMUNITY)
Admission: EM | Admit: 2011-06-10 | Discharge: 2011-06-10 | Disposition: A | Discharge status: Home / Self Care | Payer: Medicaid Other | Attending: Emergency Medicine | Admitting: Emergency Medicine

## 2011-06-10 ENCOUNTER — Emergency Department (HOSPITAL_COMMUNITY): Payer: Medicaid Other

## 2011-06-10 DIAGNOSIS — R112 Nausea with vomiting, unspecified: Secondary | ICD-10-CM | POA: Insufficient documentation

## 2011-06-10 DIAGNOSIS — Z79899 Other long term (current) drug therapy: Secondary | ICD-10-CM | POA: Insufficient documentation

## 2011-06-10 DIAGNOSIS — J45909 Unspecified asthma, uncomplicated: Secondary | ICD-10-CM | POA: Insufficient documentation

## 2011-06-10 DIAGNOSIS — K5289 Other specified noninfective gastroenteritis and colitis: Secondary | ICD-10-CM | POA: Insufficient documentation

## 2011-06-10 DIAGNOSIS — R197 Diarrhea, unspecified: Secondary | ICD-10-CM | POA: Insufficient documentation

## 2011-06-10 DIAGNOSIS — R1013 Epigastric pain: Secondary | ICD-10-CM | POA: Insufficient documentation

## 2011-06-10 DIAGNOSIS — R10816 Epigastric abdominal tenderness: Secondary | ICD-10-CM | POA: Insufficient documentation

## 2011-06-10 LAB — DIFFERENTIAL
Eosinophils Absolute: 0.1 10*3/uL (ref 0.0–0.7)
Lymphs Abs: 4.7 10*3/uL — ABNORMAL HIGH (ref 0.7–4.0)
Monocytes Relative: 5 % (ref 3–12)
Neutrophils Relative %: 55 % (ref 43–77)

## 2011-06-10 LAB — COMPREHENSIVE METABOLIC PANEL
AST: 25 U/L (ref 0–37)
BUN: 7 mg/dL (ref 6–23)
CO2: 25 mEq/L (ref 19–32)
Calcium: 9 mg/dL (ref 8.4–10.5)
Creatinine, Ser: 0.65 mg/dL (ref 0.4–1.2)
GFR calc Af Amer: >60 mL/min (ref >60)
GFR calc non Af Amer: >60 mL/min (ref >60)
Glucose, Bld: 122 mg/dL — ABNORMAL HIGH (ref 70–99)

## 2011-06-10 LAB — URINALYSIS, ROUTINE W REFLEX MICROSCOPIC
Hgb urine dipstick: NEGATIVE
Protein, ur: NEGATIVE mg/dL
Urobilinogen, UA: 1 mg/dL (ref 0.0–1.0)

## 2011-06-10 LAB — URINE MICROSCOPIC-ADD ON

## 2011-06-10 LAB — LIPASE, BLOOD: Lipase: 28 U/L (ref 11–59)

## 2011-06-10 LAB — POCT PREGNANCY, URINE: Preg Test, Ur: NEGATIVE

## 2011-06-10 LAB — CBC
MCH: 31.2 pg (ref 26.0–34.0)
MCV: 87.8 fL (ref 78.0–100.0)
Platelets: 170 10*3/uL (ref 150–400)
RBC: 4.43 MIL/uL (ref 3.87–5.11)

## 2011-06-10 LAB — SAMPLE TO BLOOD BANK

## 2011-06-16 ENCOUNTER — Telehealth: Payer: Self-pay | Admitting: Internal Medicine

## 2011-06-16 DIAGNOSIS — I4949 Other premature depolarization: Secondary | ICD-10-CM

## 2011-06-16 MED ORDER — ATENOLOL 50 MG PO TABS: 50.0000 mg | ORAL_TABLET | Freq: Every day | ORAL | Status: DC

## 2011-06-16 NOTE — Telephone Encounter (Signed)
Atenolol 50 mg. [facility] on [address]

## 2011-07-28 ENCOUNTER — Telehealth: Payer: Self-pay | Admitting: Gastroenterology

## 2011-07-28 NOTE — Telephone Encounter (Signed)
She definitely needs a repeat manometry to firmly establish the diagnosis.  Once done she can return and we can discuss options

## 2011-07-28 NOTE — Telephone Encounter (Signed)
Left message for pt to call back  °

## 2011-07-28 NOTE — Telephone Encounter (Signed)
Pt states that Raven [name] is just doing tests and procedures that she has already had done here with Dr. Arlyce Dice. Pt is frustrated and feels like Raven [name] is not helping her. Pt wants to know if we can just go ahead and refer her surgery........myotomy. Dr. Arlyce Dice please advise.

## 2011-07-28 NOTE — Telephone Encounter (Signed)
Does she need to have this done at [facility] or do I need to set this up?

## 2011-07-29 NOTE — Telephone Encounter (Signed)
She should have this done at [facility]

## 2011-07-29 NOTE — Telephone Encounter (Signed)
Message left for pt

## 2011-08-04 ENCOUNTER — Other Ambulatory Visit: Payer: Medicaid Other

## 2011-08-29 ENCOUNTER — Ambulatory Visit: Payer: Medicaid Other | Admitting: Internal Medicine

## 2011-09-09 ENCOUNTER — Telehealth: Payer: Self-pay | Admitting: Gastroenterology

## 2011-09-09 NOTE — Telephone Encounter (Signed)
Pt states that she had her manometry done at [facility] yesterday and that the physician told her she was having muscle spasms in the bottom of her throat and that he wants to treat her with Trazadone. Pt is concerned because when she had the manometry done here she was dx with achalasia and treated with botox. Pt wants to know Dr. Marzetta Board opinion regarding her recent diagnosis and treatment plan with the Trazadone. Please advise.

## 2011-09-11 ENCOUNTER — Ambulatory Visit: Payer: Medicaid Other

## 2011-09-11 NOTE — Telephone Encounter (Signed)
Pt was sent to [facility] for a second opinion because her manometry study was equivocal.  I think trazadone is a reasonable approach.

## 2011-09-12 NOTE — Telephone Encounter (Signed)
Pt aware.

## 2011-10-01 ENCOUNTER — Inpatient Hospital Stay (INDEPENDENT_AMBULATORY_CARE_PROVIDER_SITE_OTHER)
Admission: RE | Admit: 2011-10-01 | Discharge: 2011-10-01 | Disposition: A | Discharge status: Home / Self Care | Payer: Medicaid Other | Source: Ambulatory Visit | Attending: Family Medicine | Admitting: Family Medicine

## 2011-10-01 DIAGNOSIS — Z0489 Encounter for examination and observation for other specified reasons: Secondary | ICD-10-CM

## 2011-10-01 LAB — POCT URINALYSIS DIP (DEVICE)
Bilirubin Urine: NEGATIVE
Ketones, ur: NEGATIVE mg/dL
pH: 6 (ref 5.0–8.0)

## 2011-10-01 LAB — WET PREP, GENITAL
Clue Cells Wet Prep HPF POC: NONE SEEN
Trich, Wet Prep: NONE SEEN

## 2011-10-02 ENCOUNTER — Emergency Department (HOSPITAL_COMMUNITY)
Admission: EM | Admit: 2011-10-02 | Discharge: 2011-10-02 | Disposition: A | Discharge status: Home / Self Care | Payer: Medicaid Other | Attending: Emergency Medicine | Admitting: Emergency Medicine

## 2011-10-02 DIAGNOSIS — J45909 Unspecified asthma, uncomplicated: Secondary | ICD-10-CM | POA: Insufficient documentation

## 2011-10-02 DIAGNOSIS — R109 Unspecified abdominal pain: Secondary | ICD-10-CM | POA: Insufficient documentation

## 2011-10-02 DIAGNOSIS — N898 Other specified noninflammatory disorders of vagina: Secondary | ICD-10-CM | POA: Insufficient documentation

## 2011-10-02 LAB — URINALYSIS, ROUTINE W REFLEX MICROSCOPIC
Bilirubin Urine: NEGATIVE
Ketones, ur: NEGATIVE mg/dL
Nitrite: NEGATIVE
pH: 6 (ref 5.0–8.0)

## 2011-10-02 LAB — DIFFERENTIAL
Basophils Absolute: 0.1 10*3/uL (ref 0.0–0.1)
Lymphocytes Relative: 41 % (ref 12–46)
Monocytes Absolute: 0.6 10*3/uL (ref 0.1–1.0)
Neutro Abs: 5.6 10*3/uL (ref 1.7–7.7)
Neutrophils Relative %: 52 % (ref 43–77)

## 2011-10-02 LAB — COMPREHENSIVE METABOLIC PANEL
ALT: 16 U/L (ref 0–35)
AST: 17 U/L (ref 0–37)
Albumin: 4.2 g/dL (ref 3.5–5.2)
Alkaline Phosphatase: 83 U/L (ref 39–117)
CO2: 29 mEq/L (ref 19–32)
Chloride: 105 mEq/L (ref 96–112)
GFR calc non Af Amer: >90 mL/min (ref >90)
Potassium: 4.2 mEq/L (ref 3.5–5.1)
Sodium: 140 mEq/L (ref 135–145)
Total Bilirubin: 0.2 mg/dL — ABNORMAL LOW (ref 0.3–1.2)

## 2011-10-02 LAB — SAMPLE TO BLOOD BANK

## 2011-10-02 LAB — GC/CHLAMYDIA PROBE AMP, GENITAL
Chlamydia, DNA Probe: NEGATIVE
GC Probe Amp, Genital: NEGATIVE

## 2011-10-02 LAB — CBC
HCT: 36.1 % (ref 36.0–46.0)
Hemoglobin: 12.8 g/dL (ref 12.0–15.0)
MCHC: 35.5 g/dL (ref 30.0–36.0)
RBC: 4.1 MIL/uL (ref 3.87–5.11)
WBC: 10.8 10*3/uL — ABNORMAL HIGH (ref 4.0–10.5)

## 2011-10-02 LAB — URINE MICROSCOPIC-ADD ON

## 2011-10-09 ENCOUNTER — Ambulatory Visit: Payer: Medicaid Other

## 2011-10-17 ENCOUNTER — Encounter: Payer: Self-pay | Admitting: Internal Medicine

## 2011-10-20 ENCOUNTER — Ambulatory Visit: Payer: Medicaid Other | Admitting: Rehabilitative and Restorative Service Providers"

## 2011-10-20 DIAGNOSIS — M6281 Muscle weakness (generalized): Secondary | ICD-10-CM | POA: Insufficient documentation

## 2011-10-20 DIAGNOSIS — IMO0001 Reserved for inherently not codable concepts without codable children: Secondary | ICD-10-CM | POA: Insufficient documentation

## 2011-10-20 DIAGNOSIS — M25579 Pain in unspecified ankle and joints of unspecified foot: Secondary | ICD-10-CM | POA: Insufficient documentation

## 2011-10-20 DIAGNOSIS — M25673 Stiffness of unspecified ankle, not elsewhere classified: Secondary | ICD-10-CM | POA: Insufficient documentation

## 2011-10-20 DIAGNOSIS — M25676 Stiffness of unspecified foot, not elsewhere classified: Secondary | ICD-10-CM | POA: Insufficient documentation

## 2011-10-30 ENCOUNTER — Ambulatory Visit: Payer: Medicaid Other | Admitting: Rehabilitative and Restorative Service Providers"

## 2011-10-30 DIAGNOSIS — M25673 Stiffness of unspecified ankle, not elsewhere classified: Secondary | ICD-10-CM | POA: Insufficient documentation

## 2011-10-30 DIAGNOSIS — M25676 Stiffness of unspecified foot, not elsewhere classified: Secondary | ICD-10-CM | POA: Insufficient documentation

## 2011-10-30 DIAGNOSIS — IMO0001 Reserved for inherently not codable concepts without codable children: Secondary | ICD-10-CM | POA: Insufficient documentation

## 2011-10-30 DIAGNOSIS — M25579 Pain in unspecified ankle and joints of unspecified foot: Secondary | ICD-10-CM | POA: Insufficient documentation

## 2011-10-30 DIAGNOSIS — M6281 Muscle weakness (generalized): Secondary | ICD-10-CM | POA: Insufficient documentation

## 2011-11-05 ENCOUNTER — Encounter: Payer: Medicaid Other | Admitting: Rehabilitative and Restorative Service Providers"

## 2012-03-05 ENCOUNTER — Ambulatory Visit: Payer: Self-pay | Admitting: Obstetrics and Gynecology

## 2012-03-16 ENCOUNTER — Encounter: Payer: Medicaid Other | Admitting: Obstetrics and Gynecology

## 2012-05-05 ENCOUNTER — Other Ambulatory Visit: Payer: Self-pay | Admitting: Obstetrics and Gynecology

## 2012-05-05 ENCOUNTER — Encounter: Payer: Self-pay | Admitting: Obstetrics and Gynecology

## 2012-05-05 ENCOUNTER — Ambulatory Visit (INDEPENDENT_AMBULATORY_CARE_PROVIDER_SITE_OTHER): Payer: Medicaid Other | Admitting: Obstetrics and Gynecology

## 2012-05-05 DIAGNOSIS — E559 Vitamin D deficiency, unspecified: Secondary | ICD-10-CM

## 2012-05-05 DIAGNOSIS — Z202 Contact with and (suspected) exposure to infections with a predominantly sexual mode of transmission: Secondary | ICD-10-CM

## 2012-05-05 DIAGNOSIS — Z8619 Personal history of other infectious and parasitic diseases: Secondary | ICD-10-CM

## 2012-05-05 DIAGNOSIS — N760 Acute vaginitis: Secondary | ICD-10-CM

## 2012-05-05 DIAGNOSIS — R32 Unspecified urinary incontinence: Secondary | ICD-10-CM

## 2012-05-05 DIAGNOSIS — E531 Pyridoxine deficiency: Secondary | ICD-10-CM

## 2012-05-05 DIAGNOSIS — N762 Acute vulvitis: Secondary | ICD-10-CM

## 2012-05-05 HISTORY — DX: Personal history of other infectious and parasitic diseases: Z86.19

## 2012-05-05 LAB — POCT WET PREP (WET MOUNT)
Clue Cells Wet Prep Whiff POC: NEGATIVE
pH: 4.5

## 2012-05-05 MED ORDER — CLOTRIMAZOLE-BETAMETHASONE 1-0.05 % EX CREA: TOPICAL_CREAM | Freq: Every day | CUTANEOUS | Status: DC

## 2012-05-05 MED ORDER — VALACYCLOVIR HCL 500 MG PO TABS: 500.0000 mg | ORAL_TABLET | Freq: Every day | ORAL | Status: DC

## 2012-05-05 NOTE — Progress Notes (Addendum)
C/o vaginal itching, recent CT and desires BTL and surgery for SUI.  Pt also has h/o OAB.  Last had bladder testing in 2009 and doesn't want to repeat it.  Filed Vitals:   05/05/12 0955  BP: 132/90  Temp: 98.5 F (36.9 C)   ROS: noncontributory  Pelvic exam:  VULVA: normal appearing vulva with no masses, tenderness or lesions, VAGINA: normal appearing vagina with normal color and discharge, no lesions, CERVIX: normal appearing cervix without discharge or lesions,  UTERUS: uterus is normal size, shape, consistency and nontender,  ADNEXA: normal adnexa in size, nontender and no masses.  Results for orders placed in visit on 05/05/12  POCT WET PREP (WET MOUNT)      Component Value Range   Source Wet Prep POC vaginal     WBC, Wet Prep HPF POC       Bacteria Wet Prep HPF POC none     BACTERIA WET PREP MORPHOLOGY POC       Clue Cells Wet Prep HPF POC None     CLUE CELLS WET PREP WHIFF POC Negative Whiff     Yeast Wet Prep HPF POC None     KOH Wet Prep POC       Trichomonas Wet Prep HPF POC none     pH 4.5     A/P Vulvitis - Lotrisone and trial of valtrex (? Viral shedding) Sched BTL and TVT - sign tubal papers. Pt informed secondary to OAB may still require meds after surgery.  Verbalizes understanding. H/o vit D deficiency - recheck vit D Currently with implanon.  Remove postoperatively.

## 2012-05-05 NOTE — Telephone Encounter (Signed)
Raven [name]/seen today by AR

## 2012-05-06 ENCOUNTER — Telehealth: Payer: Self-pay

## 2012-05-06 NOTE — Telephone Encounter (Signed)
Is it ok to give her liquid meds? Pt cannot swallow pills. Raven [name]

## 2012-05-06 NOTE — Telephone Encounter (Signed)
Called [facility] to give AR's permission to make oral suspension of Valtrex pills she had filled at another pharmacy. Melody Comas A

## 2012-05-06 NOTE — Telephone Encounter (Signed)
Spoke to pt to let her know it's ok for her to have liquid susp. Made out of her Valtrex pills she had filled at another pharmacy by [facility]. I will call [facility] and give them permission to do so. Melody Comas A

## 2012-05-07 NOTE — Telephone Encounter (Signed)
Tc to pt per telephone call. Told pt GC/CT-neg. Wet prep-neg. Vit D=21. Needs Vitamin D protocol. Pt req liquid suspense for rx due to inability to swallow pills. Pt req rx to be called to [facility]. Pt req a copy of labs and will come to the office today to pick up. Pt put on recall list x 12wks for repeat Vit D. Pt voices understanding. Rx (liquid form) called to [facility]. Spoke with Ed(pharm).

## 2012-05-07 NOTE — Telephone Encounter (Signed)
Triage/electronic 

## 2012-05-13 ENCOUNTER — Other Ambulatory Visit: Payer: Self-pay

## 2012-05-13 ENCOUNTER — Telehealth: Payer: Self-pay

## 2012-05-13 NOTE — Telephone Encounter (Signed)
Pt was called regarding Vit-d protocol, pt stated that CMA Karren Burly had already spoken with her about her lab results and RX had already been called in on 05/06/2012. Future lab will be ordered. Raven [name]

## 2012-05-25 ENCOUNTER — Telehealth: Payer: Self-pay | Admitting: Obstetrics and Gynecology

## 2012-05-26 ENCOUNTER — Telehealth: Payer: Self-pay | Admitting: Obstetrics and Gynecology

## 2012-05-26 ENCOUNTER — Other Ambulatory Visit: Payer: Self-pay | Admitting: Obstetrics and Gynecology

## 2012-05-26 NOTE — Telephone Encounter (Signed)
TVT/Cystoscopy; Laparoscopic Bilateral Fulgeration scheduled for 06/07/12 @ 2:00 with AR.  [place] # [id number] good for pre-op and follow up per [employer]. -Adrianne Pridgen  

## 2012-05-27 ENCOUNTER — Telehealth: Payer: Self-pay | Admitting: Obstetrics and Gynecology

## 2012-05-27 ENCOUNTER — Encounter (HOSPITAL_COMMUNITY): Payer: Self-pay | Admitting: Pharmacist

## 2012-05-27 NOTE — Telephone Encounter (Signed)
TVT/Cystoscopy; Laparoscopic Bilateral Fulgeration scheduled for 06/07/12 @ 2:00 with AR.  [place] # [id number] good for pre-op and follow up per [employer]. -ap

## 2012-05-27 NOTE — Telephone Encounter (Addendum)
TVT/Cystoscopy; Laparoscopic Bilateral Fulgeration scheduled for 06/07/12 @ 2:00 with AR.  [place] # [id number] good for pre-op and follow up per [employer]. -Adrianne Pridgen

## 2012-05-31 ENCOUNTER — Encounter (HOSPITAL_COMMUNITY): Payer: Self-pay

## 2012-06-01 ENCOUNTER — Encounter (HOSPITAL_COMMUNITY)
Admission: RE | Admit: 2012-06-01 | Discharge: 2012-06-01 | Disposition: A | Discharge status: Home / Self Care | Payer: Medicaid Other | Source: Ambulatory Visit | Attending: Obstetrics and Gynecology | Admitting: Obstetrics and Gynecology

## 2012-06-01 ENCOUNTER — Encounter (HOSPITAL_COMMUNITY): Payer: Self-pay

## 2012-06-01 HISTORY — DX: Anxiety disorder, unspecified: F41.9

## 2012-06-01 HISTORY — DX: Palpitations: R00.2

## 2012-06-01 HISTORY — DX: Encounter for other specified aftercare: Z51.89

## 2012-06-01 LAB — BASIC METABOLIC PANEL
CO2: 28 mEq/L (ref 19–32)
Calcium: 9.7 mg/dL (ref 8.4–10.5)
Creatinine, Ser: 0.81 mg/dL (ref 0.50–1.10)
GFR calc Af Amer: >90 mL/min (ref >90)
GFR calc non Af Amer: >90 mL/min (ref >90)
Sodium: 139 mEq/L (ref 135–145)

## 2012-06-01 LAB — CBC
HCT: 42.5 % (ref 36.0–46.0)
MCV: 89.9 fL (ref 78.0–100.0)
Platelets: 216 10*3/uL (ref 150–400)
RBC: 4.73 MIL/uL (ref 3.87–5.11)
RDW: 13.4 % (ref 11.5–15.5)
WBC: 12.5 10*3/uL — ABNORMAL HIGH (ref 4.0–10.5)

## 2012-06-01 LAB — SURGICAL PCR SCREEN: Staphylococcus aureus: NEGATIVE

## 2012-06-01 NOTE — Pre-Procedure Instructions (Signed)
[facility] in Blood [facility] inform of patient's history of blood transfusion 01/2006.

## 2012-06-01 NOTE — Patient Instructions (Addendum)
   Your procedure is scheduled on: Monday, June 10th  Enter through the Main Entrance of [facility] at: 12:30pm Pick up the phone at the desk and dial [phone] and inform us of your arrival.  Please call this number if you have any problems the morning of surgery: [phone]  Remember: Do not eat food after midnight: Sunday Do not drink clear liquids after: 10am Monday Take these medicines the morning of surgery with a SIP OF WATER: atenolol prior to 10am  Do not wear jewelry, make-up, or FINGER nail polish Do not wear lotions, powders, perfumes or deodorant. Do not shave 48 hours prior to surgery. Do not bring valuables to the hospital. Contacts, dentures or bridgework may not be worn into surgery.  Leave suitcase in the car. After Surgery it may be brought to your room. For patients being admitted to the hospital, checkout time is 11:00am the day of discharge.  Patients discharged on the day of surgery will not be allowed to drive home.  Home with mother Bessie Boyte.   Remember to use your hibiclens as instructed.Please shower with 1/2 bottle the evening before your surgery and the other 1/2 bottle the morning of surgery. Neck down avoiding private area.

## 2012-06-01 NOTE — Pre-Procedure Instructions (Signed)
Dr Malen Gauze reviewed patient's history and EKG.  Ok to see patient DOS.

## 2012-06-02 ENCOUNTER — Encounter: Payer: Medicaid Other | Admitting: Obstetrics and Gynecology

## 2012-06-02 ENCOUNTER — Telehealth: Payer: Self-pay | Admitting: Obstetrics and Gynecology

## 2012-06-02 NOTE — Telephone Encounter (Signed)
TVT/Cystoscopy; Laparoscopic Bilateral Fulgeration rescheduled to 06/11/12 @ 11:15 with AR/ep.  [place] # [id number] good for pre-op and follow up per [employer]. -Adrianne Pridgen

## 2012-06-04 ENCOUNTER — Telehealth: Payer: Self-pay | Admitting: Obstetrics and Gynecology

## 2012-06-07 ENCOUNTER — Telehealth: Payer: Self-pay

## 2012-06-07 NOTE — Telephone Encounter (Signed)
Spoke to pt and answered questions re: recall letter that she rec'd. Alvino Chapel, CMA

## 2012-06-08 ENCOUNTER — Encounter: Payer: Self-pay | Admitting: Obstetrics and Gynecology

## 2012-06-08 ENCOUNTER — Ambulatory Visit (INDEPENDENT_AMBULATORY_CARE_PROVIDER_SITE_OTHER): Payer: Medicaid Other | Admitting: Obstetrics and Gynecology

## 2012-06-08 VITALS — BP 132/80 | HR 82 | Temp 98.0°F | Resp 16 | Ht 69.0 in | Wt 203.0 lb

## 2012-06-08 DIAGNOSIS — R6889 Other general symptoms and signs: Secondary | ICD-10-CM

## 2012-06-08 DIAGNOSIS — B009 Herpesviral infection, unspecified: Secondary | ICD-10-CM

## 2012-06-08 DIAGNOSIS — F32A Depression, unspecified: Secondary | ICD-10-CM | POA: Insufficient documentation

## 2012-06-08 DIAGNOSIS — K220 Achalasia of cardia: Secondary | ICD-10-CM

## 2012-06-08 DIAGNOSIS — F419 Anxiety disorder, unspecified: Secondary | ICD-10-CM | POA: Insufficient documentation

## 2012-06-08 DIAGNOSIS — F329 Major depressive disorder, single episode, unspecified: Secondary | ICD-10-CM

## 2012-06-08 DIAGNOSIS — R011 Cardiac murmur, unspecified: Secondary | ICD-10-CM

## 2012-06-08 DIAGNOSIS — R32 Unspecified urinary incontinence: Secondary | ICD-10-CM

## 2012-06-08 DIAGNOSIS — K219 Gastro-esophageal reflux disease without esophagitis: Secondary | ICD-10-CM

## 2012-06-08 DIAGNOSIS — Z01818 Encounter for other preprocedural examination: Secondary | ICD-10-CM

## 2012-06-08 DIAGNOSIS — F411 Generalized anxiety disorder: Secondary | ICD-10-CM

## 2012-06-08 DIAGNOSIS — IMO0002 Reserved for concepts with insufficient information to code with codable children: Secondary | ICD-10-CM

## 2012-06-08 DIAGNOSIS — R002 Palpitations: Secondary | ICD-10-CM

## 2012-06-08 DIAGNOSIS — F3289 Other specified depressive episodes: Secondary | ICD-10-CM

## 2012-06-08 NOTE — H&P (Signed)
Raven [name] is a 31 y.o. female [phone] who presents for tubal sterilization and placement of tension free vaginal tape because she no longer desires to bear children and urinary incontinence.  Since the birth of her last child, six years ago, patient has had increasingly worsening urinary urgency and incontinence.  In 2009 the patient underwent cystometrics that demonstrated mixed urinary incontinence. Patient began Detrol LA, Kegel exercise and bladder training exercises however, her symptoms did not respond.  Patient reports urgency, incomplete emptying of bladder, pelvic pressure and discomfort with intercourse related to her bladder.  She has to wear a perineal pad at all times and will leak with and without coughing, sneezing or lifting.  She denies pelvic pain, constipation, diarrhea, dysuria or hematuria.  In the past the patient has used Norplant, Mirena IUD, Depo Provera and now Implanon for contraception.  She has decided that she no longer wants any more children and wants to proceed with tubal sterilization.  Though given both medical and surgical options for managing her incontinence symptoms, patient chooses to proceed with placement of tension free vaginal tape.  Past Medical History  OB History: [id number]; Spontaneous vaginal birth 2000 & 2007  GYN History: menarche 31 YO    LMP 06/06/12   Contracepton: Implanon;   History of HPV, chlamydia  and HSV-2;  Underwent colposcopy for an abnormal PAP in 2000  Last PAP smear was normal 01/2011  Medical History: vitamin D deficiency, asthma, gastroesophageal reflux disease, achalasia, esophageal stricture, hiatal hernia, depression, anxiety, premature ventricular contractions, pelvic inflammatory disease.  Surgical History:2000  Tonsillectomy-Adenoidectomy;   2001 Right Breast Biopsy-fibroadenoma;  2007 Dilatation and Curettage; 2012 Esophageal Surgery for stricture  Denies problems with anesthesia. Blood transfusion related to childbirth in  2007  Family History: stroke, hypertension, heart attack  Social History:  Single;  Denies illicit drug use; occasionally consumes alcohol and is in the process of quitting cigarettes-smokes one a day  Outpatient Encounter Prescriptions as of 06/08/2012 Medication Sig Dispense Refill . atenolol (TENORMIN) 50 MG tablet Take 1 tablet (50 mg total) by mouth daily.  30 tablet  6 . beclomethasone (QVAR) 80 MCG/ACT inhaler Inhale 1 puff into the lungs 2 (two) times daily as needed. For asthma     . clonazePAM (KLONOPIN) 1 MG tablet Take 1 mg by mouth at bedtime as needed. For anxiety     . PRESCRIPTION MEDICATION Take 500 mg by mouth daily. Pt takes 500mg  of oral (liquid) Valtrex that she gets compounded from [facility]     . clotrimazole-betamethasone (LOTRISONE) cream Apply topically daily.  30 g  0 . phentermine 37.5 MG capsule Take 37.5 mg by mouth every morning.       No Known Allergies Denies sensitivity to latex,soy, peanuts, adhesives or shellfish.  ROS:  no glasses/contact lenses, denies headache, vision changes, dysphagia, tinnitus, dizziness,   has chest pain only when PVCs occur , shortness of breath-only with asthma flare, nausea, vomiting, diarrhea, dysuria, hematuria, pelvic pain, swelling of joints,easy bruising,  myalgias, arthralgias, skin rashes and except as is mentioned in the history of present illness, patient's review of systems is otherwise negative    Physical Exam    BP 132/80  Pulse 82  Temp 98 F (36.7 C)  Resp 16  Ht 5\' 9"  (1.753 m)  Wt 203 lb (92.08 kg)  BMI 29.98 kg/m2  LMP 06/06/2012  Neck: supple no masses Lungs: clear to auscultation Heart: regular rate and rhythm Abdomen:  soft, mild epigastric  tenderness without guarding and no rebound; no organomegaly Pelvic:EGBUS-wnl, vagina with 3/4 cystocele; cervix-no lesions, uterus-normal size without tenderness, adnexae-no tenderness Extremities:  no clubbing, cyanosis or edema   Assesment:  Mixed Urinary Incontinence                     Desire for permanent sterilization   Disposition:  A discussion was held with patient regarding the indication for her procedure(s) along with the risks, which include but are not limited to: reaction to anesthesia, damage to adjacent organs, infection, excessive bleeding., erosion of tension free tape, worsening of urinary symptoms and failure rate of sterilization 2 per 500.  Patient verbalized understanding of these risks and has consented to proceed with placement of tension free vaginal tape and tubal sterilization at [facility] June 11, 2012 at 11:15 a. m.   CSN# [id number]   Jakyron Fabro J. Lowell Guitar, PA-C  for Dr. Woodroe Mode. Su Hilt

## 2012-06-08 NOTE — Progress Notes (Signed)
Raven [name] is a 31 y.o. female [phone] who presents for tubal sterilization and placement of tension free vaginal tape because she no longer desires to bear children and urinary incontinence.  Since the birth of her last child, six years ago, patient has had increasingly worsening urinary urgency and incontinence.  In 2009 the patient underwent cystometrics that demonstrated mixed urinary incontinence. Patient began Detrol LA, Kegel exercise and bladder training exercises however, her symptoms did not respond.  Patient reports urgency, incomplete emptying of bladder, pelvic pressure and discomfort with intercourse related to her bladder.  She has to wear a perineal pad at all times and will leak with and without coughing, sneezing or lifting.  She denies pelvic pain, constipation, diarrhea, dysuria or hematuria.  In the past the patient has used Norplant, Mirena IUD, Depo Provera and now Implanon for contraception.  She has decided that she no longer wants any more children and wants to proceed with tubal sterilization.  Though given both medical and surgical options for managing her incontinence symptoms, patient chooses to proceed with placement of tension free vaginal tape.  Past Medical History  OB History: [id number]; Spontaneous vaginal birth 2000 & 2007,  GYN History: menarche 31 YO    LMP 06/06/12   Contracepton: Implanon;   History of HPV, chlamydia  and HSV-2;  Underwent colposcopy for an abnormal PAP in 2000  Last PAP smear was normal 01/2011  Medical History: vitamin D deficiency, asthma, gastroesophageal reflux disease, achalasia, esophageal stricture, hiatal hernia, depression, anxiety, premature ventricular contractions, pelvic inflammatory disease.  Surgical History:2000  Tonsillectomy-Adenoidectomy;   2001 Right Breast Biopsy-fibroadenoma;  2007 Dilatation and Curettage; 2012 Esophageal Surgery for stricture  Denies problems with anesthesia. Blood transfusion related to childbirth  in 2007  Family History: stroke, hypertension, heart attack  Social History:  Single;  Denies illicit drug use; occasionally consumes alcohol and is in the process of quitting cigarettes-smokes one a day  Outpatient Encounter Prescriptions as of 06/08/2012  Medication Sig Dispense Refill  . atenolol (TENORMIN) 50 MG tablet Take 1 tablet (50 mg total) by mouth daily.  30 tablet  6  . beclomethasone (QVAR) 80 MCG/ACT inhaler Inhale 1 puff into the lungs 2 (two) times daily as needed. For asthma      . clonazePAM (KLONOPIN) 1 MG tablet Take 1 mg by mouth at bedtime as needed. For anxiety      . PRESCRIPTION MEDICATION Take 500 mg by mouth daily. Pt takes 500mg  of oral (liquid) Valtrex that she gets compounded from [facility]      . clotrimazole-betamethasone (LOTRISONE) cream Apply topically daily.  30 g  0  . phentermine 37.5 MG capsule Take 37.5 mg by mouth every morning.        No Known Allergies Denies sensitivity to latex,soy, peanuts, adhesives or shellfish.  ROS:  no glasses/contact lenses, denies headache, vision changes, dysphagia, tinnitus, dizziness,   has chest pain only when PVCs occur , shortness of breath-only with asthma flare, nausea, vomiting, diarrhea, dysuria, hematuria, pelvic pain, swelling of joints,easy bruising,  myalgias, arthralgias, skin rashes and except as is mentioned in the history of present illness, patient's review of systems is otherwise negative    Physical Exam    BP 132/80  Pulse 82  Temp 98 F (36.7 C)  Resp 16  Ht 5\' 9"  (1.753 m)  Wt 203 lb (92.08 kg)  BMI 29.98 kg/m2  LMP 06/06/2012  Neck: supple no masses Lungs: clear to auscultation Heart: regular  rate and rhythm Abdomen:  soft, mild epigastric tenderness without guarding and no rebound; no organomegaly Pelvic:EGBUS-wnl, vagina with 3/4 cystocele; cervix-no lesions, uterus-normal size without tenderness, adnexae-no tenderness Extremities:  no clubbing, cyanosis or  edema   Assesment: Mixed Urinary Incontinence                     Desire for permanent sterilization   Disposition:  A discussion was held with patient regarding the indication for her procedure(s) along with the risks, which include but are not limited to: reaction to anesthesia, damage to adjacent organs, infection, excessive bleeding., erosion of tension free tape, worsening of urinary symptoms and failure rate of sterilization 2 per 500.  Patient verbalized understanding of these risks and has consented to proceed with placement of tension free vaginal tape and tubal sterilization at [facility] June 11, 2012 at 11:15 a. m.   CSN# [id number]   Siraj Dermody J. Lowell Guitar, PA-C  for Dr. Woodroe Mode. Su Hilt

## 2012-06-10 MED ORDER — DEXTROSE 5 % IV SOLN
2.0000 g | INTRAVENOUS | Status: AC
  Administered 2012-06-11: 2 g via INTRAVENOUS
  Filled 2012-06-10: qty 2

## 2012-06-11 ENCOUNTER — Encounter (HOSPITAL_COMMUNITY)
Admission: RE | Disposition: A | Discharge status: Home / Self Care | Payer: Self-pay | Source: Ambulatory Visit | Attending: Obstetrics and Gynecology

## 2012-06-11 ENCOUNTER — Encounter (HOSPITAL_COMMUNITY): Payer: Self-pay | Admitting: Anesthesiology

## 2012-06-11 ENCOUNTER — Ambulatory Visit (HOSPITAL_COMMUNITY)
Admission: RE | Admit: 2012-06-11 | Discharge: 2012-06-12 | Disposition: A | Discharge status: Home / Self Care | Payer: Medicaid Other | Source: Ambulatory Visit | Attending: Obstetrics and Gynecology | Admitting: Obstetrics and Gynecology

## 2012-06-11 ENCOUNTER — Ambulatory Visit (HOSPITAL_COMMUNITY): Payer: Medicaid Other | Admitting: Anesthesiology

## 2012-06-11 ENCOUNTER — Encounter (HOSPITAL_COMMUNITY): Payer: Self-pay | Admitting: *Deleted

## 2012-06-11 DIAGNOSIS — Z302 Encounter for sterilization: Secondary | ICD-10-CM | POA: Insufficient documentation

## 2012-06-11 DIAGNOSIS — I4949 Other premature depolarization: Secondary | ICD-10-CM

## 2012-06-11 DIAGNOSIS — Z01818 Encounter for other preprocedural examination: Secondary | ICD-10-CM | POA: Insufficient documentation

## 2012-06-11 DIAGNOSIS — Z3046 Encounter for surveillance of implantable subdermal contraceptive: Secondary | ICD-10-CM | POA: Insufficient documentation

## 2012-06-11 DIAGNOSIS — N393 Stress incontinence (female) (male): Secondary | ICD-10-CM

## 2012-06-11 DIAGNOSIS — Z01812 Encounter for preprocedural laboratory examination: Secondary | ICD-10-CM | POA: Insufficient documentation

## 2012-06-11 HISTORY — PX: FOREIGN BODY REMOVAL: SHX962

## 2012-06-11 HISTORY — PX: CYSTOSCOPY: SHX5120

## 2012-06-11 HISTORY — PX: LAPAROSCOPIC TUBAL LIGATION: SHX1937

## 2012-06-11 HISTORY — PX: BLADDER SUSPENSION: SHX72

## 2012-06-11 LAB — HCG, SERUM, QUALITATIVE: Preg, Serum: NEGATIVE

## 2012-06-11 SURGERY — TRANSVAGINAL TAPE (TVT) PROCEDURE
Anesthesia: General | Site: Vagina | Laterality: Right | Wound class: Clean Contaminated

## 2012-06-11 MED ORDER — NEOSTIGMINE METHYLSULFATE 1 MG/ML IJ SOLN
INTRAMUSCULAR | Status: AC
  Filled 2012-06-11: qty 10

## 2012-06-11 MED ORDER — FENTANYL CITRATE 0.05 MG/ML IJ SOLN
INTRAMUSCULAR | Status: AC
  Filled 2012-06-11: qty 5

## 2012-06-11 MED ORDER — FENTANYL CITRATE 0.05 MG/ML IJ SOLN
25.0000 ug | INTRAMUSCULAR | Status: DC | PRN
  Administered 2012-06-11: 50 ug via INTRAVENOUS
  Administered 2012-06-11 (×2): 25 ug via INTRAVENOUS

## 2012-06-11 MED ORDER — HYDROMORPHONE 0.3 MG/ML IV SOLN
INTRAVENOUS | Status: DC
  Administered 2012-06-11: 15:00:00 via INTRAVENOUS
  Administered 2012-06-11: 0.6 mg via INTRAVENOUS
  Administered 2012-06-11: 0.399 mg via INTRAVENOUS
  Administered 2012-06-12: 0.2 mg via INTRAVENOUS
  Administered 2012-06-12: 0.599 mg via INTRAVENOUS
  Administered 2012-06-12: 0.4 mg via INTRAVENOUS
  Filled 2012-06-11: qty 25

## 2012-06-11 MED ORDER — LIDOCAINE HCL (CARDIAC) 20 MG/ML IV SOLN
INTRAVENOUS | Status: DC | PRN
  Administered 2012-06-11: 80 mg via INTRAVENOUS

## 2012-06-11 MED ORDER — LACTATED RINGERS IV SOLN
INTRAVENOUS | Status: DC
  Administered 2012-06-11 – 2012-06-12 (×2): via INTRAVENOUS

## 2012-06-11 MED ORDER — NEOSTIGMINE METHYLSULFATE 1 MG/ML IJ SOLN
INTRAMUSCULAR | Status: DC | PRN
  Administered 2012-06-11: 2 mg via INTRAVENOUS

## 2012-06-11 MED ORDER — DEXAMETHASONE SODIUM PHOSPHATE 10 MG/ML IJ SOLN
INTRAMUSCULAR | Status: AC
  Filled 2012-06-11: qty 1

## 2012-06-11 MED ORDER — DEXAMETHASONE SODIUM PHOSPHATE 10 MG/ML IJ SOLN
INTRAMUSCULAR | Status: DC | PRN
  Administered 2012-06-11: 10 mg via INTRAVENOUS

## 2012-06-11 MED ORDER — INDIGOTINDISULFONATE SODIUM 8 MG/ML IJ SOLN
INTRAMUSCULAR | Status: AC
  Filled 2012-06-11: qty 5

## 2012-06-11 MED ORDER — PNEUMOCOCCAL VAC POLYVALENT 25 MCG/0.5ML IJ INJ
0.5000 mL | INJECTION | INTRAMUSCULAR | Status: AC
  Administered 2012-06-12: 0.5 mL via INTRAMUSCULAR
  Filled 2012-06-11: qty 0.5

## 2012-06-11 MED ORDER — CLONAZEPAM 0.5 MG PO TABS: 1.0000 mg | ORAL_TABLET | Freq: Every evening | ORAL | Status: DC | PRN

## 2012-06-11 MED ORDER — IBUPROFEN 600 MG PO TABS: 600.0000 mg | ORAL_TABLET | Freq: Four times a day (QID) | ORAL | Status: DC | PRN

## 2012-06-11 MED ORDER — ATENOLOL 50 MG PO TABS
50.0000 mg | ORAL_TABLET | Freq: Every day | ORAL | Status: DC
  Administered 2012-06-12: 50 mg via ORAL
  Filled 2012-06-11 (×2): qty 1

## 2012-06-11 MED ORDER — HYDROMORPHONE HCL PF 1 MG/ML IJ SOLN
INTRAMUSCULAR | Status: AC
  Filled 2012-06-11: qty 1

## 2012-06-11 MED ORDER — ESTRADIOL 0.1 MG/GM VA CREA
TOPICAL_CREAM | VAGINAL | Status: DC | PRN
  Administered 2012-06-11: 1 via VAGINAL

## 2012-06-11 MED ORDER — FENTANYL CITRATE 0.05 MG/ML IJ SOLN
INTRAMUSCULAR | Status: DC | PRN
  Administered 2012-06-11 (×2): 25 ug via INTRAVENOUS
  Administered 2012-06-11: 50 ug via INTRAVENOUS
  Administered 2012-06-11: 100 ug via INTRAVENOUS
  Administered 2012-06-11: 50 ug via INTRAVENOUS
  Administered 2012-06-11: 100 ug via INTRAVENOUS

## 2012-06-11 MED ORDER — PROPOFOL 10 MG/ML IV EMUL
INTRAVENOUS | Status: AC
  Filled 2012-06-11: qty 20

## 2012-06-11 MED ORDER — ONDANSETRON HCL 4 MG/2ML IJ SOLN: 4.0000 mg | Freq: Four times a day (QID) | INTRAMUSCULAR | Status: DC | PRN

## 2012-06-11 MED ORDER — HYDROMORPHONE HCL PF 1 MG/ML IJ SOLN
INTRAMUSCULAR | Status: DC | PRN
  Administered 2012-06-11: 1 mg via INTRAVENOUS

## 2012-06-11 MED ORDER — BECLOMETHASONE DIPROPIONATE 80 MCG/ACT IN AERS: 1.0000 | INHALATION_SPRAY | Freq: Two times a day (BID) | RESPIRATORY_TRACT | Status: DC | PRN

## 2012-06-11 MED ORDER — MENTHOL 3 MG MT LOZG: 1.0000 | LOZENGE | OROMUCOSAL | Status: DC | PRN

## 2012-06-11 MED ORDER — NALOXONE HCL 0.4 MG/ML IJ SOLN: 0.4000 mg | INTRAMUSCULAR | Status: DC | PRN

## 2012-06-11 MED ORDER — FENTANYL CITRATE 0.05 MG/ML IJ SOLN
INTRAMUSCULAR | Status: AC
  Filled 2012-06-11: qty 2

## 2012-06-11 MED ORDER — GLYCOPYRROLATE 0.2 MG/ML IJ SOLN
INTRAMUSCULAR | Status: DC | PRN
  Administered 2012-06-11: .4 mg via INTRAVENOUS

## 2012-06-11 MED ORDER — GLYCOPYRROLATE 0.2 MG/ML IJ SOLN
INTRAMUSCULAR | Status: AC
  Filled 2012-06-11: qty 1

## 2012-06-11 MED ORDER — ONDANSETRON HCL 4 MG/2ML IJ SOLN
INTRAMUSCULAR | Status: DC | PRN
  Administered 2012-06-11: 4 mg via INTRAVENOUS

## 2012-06-11 MED ORDER — DIPHENHYDRAMINE HCL 12.5 MG/5ML PO ELIX
12.5000 mg | ORAL_SOLUTION | Freq: Four times a day (QID) | ORAL | Status: DC | PRN
  Filled 2012-06-11: qty 5

## 2012-06-11 MED ORDER — LACTATED RINGERS IV SOLN
INTRAVENOUS | Status: DC
  Administered 2012-06-11 (×3): via INTRAVENOUS

## 2012-06-11 MED ORDER — DIPHENHYDRAMINE HCL 50 MG/ML IJ SOLN: 12.5000 mg | Freq: Four times a day (QID) | INTRAMUSCULAR | Status: DC | PRN

## 2012-06-11 MED ORDER — ROCURONIUM BROMIDE 50 MG/5ML IV SOLN
INTRAVENOUS | Status: AC
  Filled 2012-06-11: qty 1

## 2012-06-11 MED ORDER — VASOPRESSIN 20 UNIT/ML IJ SOLN
INTRAVENOUS | Status: DC | PRN
  Administered 2012-06-11: 11:00:00 via INTRAMUSCULAR

## 2012-06-11 MED ORDER — BUPIVACAINE HCL (PF) 0.25 % IJ SOLN
INTRAMUSCULAR | Status: DC | PRN
  Administered 2012-06-11: 30 mL

## 2012-06-11 MED ORDER — VASOPRESSIN 20 UNIT/ML IJ SOLN
INTRAMUSCULAR | Status: AC
  Filled 2012-06-11: qty 1

## 2012-06-11 MED ORDER — INDIGOTINDISULFONATE SODIUM 8 MG/ML IJ SOLN
INTRAMUSCULAR | Status: DC | PRN
  Administered 2012-06-11: 40 mg via INTRAVENOUS

## 2012-06-11 MED ORDER — BUPIVACAINE HCL (PF) 0.25 % IJ SOLN
INTRAMUSCULAR | Status: AC
  Filled 2012-06-11: qty 30

## 2012-06-11 MED ORDER — STERILE WATER FOR IRRIGATION IR SOLN
Status: DC | PRN
  Administered 2012-06-11: 1000 mL via INTRAVESICAL

## 2012-06-11 MED ORDER — ROCURONIUM BROMIDE 100 MG/10ML IV SOLN
INTRAVENOUS | Status: DC | PRN
  Administered 2012-06-11: 40 mg via INTRAVENOUS
  Administered 2012-06-11: 10 mg via INTRAVENOUS

## 2012-06-11 MED ORDER — ESTRADIOL 0.1 MG/GM VA CREA
TOPICAL_CREAM | VAGINAL | Status: AC
  Filled 2012-06-11: qty 42.5

## 2012-06-11 MED ORDER — PROPOFOL 10 MG/ML IV EMUL
INTRAVENOUS | Status: DC | PRN
  Administered 2012-06-11: 200 mg via INTRAVENOUS

## 2012-06-11 MED ORDER — MIDAZOLAM HCL 5 MG/5ML IJ SOLN
INTRAMUSCULAR | Status: DC | PRN
  Administered 2012-06-11: 2 mg via INTRAVENOUS

## 2012-06-11 MED ORDER — OXYCODONE-ACETAMINOPHEN 5-325 MG/5ML PO SOLN
5.0000 mL | ORAL | Status: DC | PRN
  Administered 2012-06-12: 5 mL via ORAL
  Filled 2012-06-11: qty 5

## 2012-06-11 MED ORDER — LIDOCAINE HCL (CARDIAC) 20 MG/ML IV SOLN
INTRAVENOUS | Status: AC
  Filled 2012-06-11: qty 5

## 2012-06-11 MED ORDER — SODIUM CHLORIDE 0.9 % IJ SOLN: 9.0000 mL | INTRAMUSCULAR | Status: DC | PRN

## 2012-06-11 MED ORDER — IBUPROFEN 100 MG/5ML PO SUSP
600.0000 mg | Freq: Four times a day (QID) | ORAL | Status: DC | PRN
  Administered 2012-06-11 – 2012-06-12 (×2): 600 mg via ORAL
  Filled 2012-06-11: qty 30

## 2012-06-11 MED ORDER — MIDAZOLAM HCL 2 MG/2ML IJ SOLN
INTRAMUSCULAR | Status: AC
  Filled 2012-06-11: qty 2

## 2012-06-11 MED ORDER — KETOROLAC TROMETHAMINE 30 MG/ML IJ SOLN: 15.0000 mg | Freq: Once | INTRAMUSCULAR | Status: DC | PRN

## 2012-06-11 MED ORDER — OXYCODONE-ACETAMINOPHEN 5-325 MG PO TABS: 1.0000 | ORAL_TABLET | ORAL | Status: DC | PRN

## 2012-06-11 MED ORDER — GLYCOPYRROLATE 0.2 MG/ML IJ SOLN
INTRAMUSCULAR | Status: AC
  Filled 2012-06-11: qty 2

## 2012-06-11 MED ORDER — FLUTICASONE PROPIONATE HFA 44 MCG/ACT IN AERO: 1.0000 | INHALATION_SPRAY | Freq: Two times a day (BID) | RESPIRATORY_TRACT | Status: DC | PRN

## 2012-06-11 MED ORDER — ONDANSETRON HCL 4 MG/2ML IJ SOLN
INTRAMUSCULAR | Status: AC
  Filled 2012-06-11: qty 2

## 2012-06-11 SURGICAL SUPPLY — 52 items
BLADE SURG 11 STRL SS (BLADE) ×10 IMPLANT
BLADE SURG 15 STRL LF C SS BP (BLADE) ×4 IMPLANT
BLADE SURG 15 STRL SS (BLADE) ×1
CANISTER SUCTION 2500CC (MISCELLANEOUS) ×5 IMPLANT
CATH FOLEY 2WAY SLVR  5CC 18FR (CATHETERS) ×1
CATH FOLEY 2WAY SLVR 5CC 18FR (CATHETERS) ×4 IMPLANT
CATH ROBINSON RED A/P 16FR (CATHETERS) ×5 IMPLANT
CHLORAPREP W/TINT 26ML (MISCELLANEOUS) ×5 IMPLANT
CLOTH BEACON ORANGE TIMEOUT ST (SAFETY) ×5 IMPLANT
COVER TABLE BACK 60X90 (DRAPES) ×5 IMPLANT
DECANTER SPIKE VIAL GLASS SM (MISCELLANEOUS) IMPLANT
DERMABOND ADVANCED (GAUZE/BANDAGES/DRESSINGS) ×3
DERMABOND ADVANCED .7 DNX12 (GAUZE/BANDAGES/DRESSINGS) ×12 IMPLANT
DRAPE HYSTEROSCOPY (DRAPE) ×5 IMPLANT
DRAPE PROXIMA HALF (DRAPES) ×5 IMPLANT
DRSG COVADERM PLUS 2X2 (GAUZE/BANDAGES/DRESSINGS) IMPLANT
GAUZE PACKING 1 X5 YD ST (GAUZE/BANDAGES/DRESSINGS) ×5 IMPLANT
GAUZE PACKING 2X5 YD STERILE (GAUZE/BANDAGES/DRESSINGS) IMPLANT
GAUZE SPONGE 4X4 16PLY XRAY LF (GAUZE/BANDAGES/DRESSINGS) IMPLANT
GLOVE BIO SURGEON STRL SZ7.5 (GLOVE) ×10 IMPLANT
GLOVE BIOGEL PI IND STRL 7.5 (GLOVE) ×8 IMPLANT
GLOVE BIOGEL PI INDICATOR 7.5 (GLOVE) ×2
GLOVE INDICATOR 7.0 STRL GRN (GLOVE) ×5 IMPLANT
GLOVE SKINSENSE NS SZ7.0 (GLOVE) ×2
GLOVE SKINSENSE NS SZ7.5 (GLOVE) ×1
GLOVE SKINSENSE STRL SZ7.0 (GLOVE) ×8 IMPLANT
GLOVE SKINSENSE STRL SZ7.5 (GLOVE) ×4 IMPLANT
GOWN PREVENTION PLUS LG XLONG (DISPOSABLE) ×15 IMPLANT
NEEDLE HYPO 22GX1.5 SAFETY (NEEDLE) ×5 IMPLANT
NEEDLE HYPO 25X1 1.5 SAFETY (NEEDLE) ×5 IMPLANT
NEEDLE INSUFFLATION 14GA 120MM (NEEDLE) ×5 IMPLANT
NEEDLE SPNL 22GX3.5 QUINCKE BK (NEEDLE) IMPLANT
NS IRRIG 1000ML POUR BTL (IV SOLUTION) ×5 IMPLANT
PACK LAPAROSCOPY BASIN (CUSTOM PROCEDURE TRAY) ×5 IMPLANT
PACK VAGINAL WOMENS (CUSTOM PROCEDURE TRAY) IMPLANT
SET CYSTO W/LG BORE CLAMP LF (SET/KITS/TRAYS/PACK) ×10 IMPLANT
SLING TRANS VAGINAL TAPE (Sling) ×1 IMPLANT
SLING UTERINE/ABD GYNECARE TVT (Sling) ×4 IMPLANT
SUT MNCRL AB 3-0 PS2 27 (SUTURE) IMPLANT
SUT MNCRL AB 4-0 PS2 18 (SUTURE) IMPLANT
SUT MON AB 4-0 PS1 27 (SUTURE) ×5 IMPLANT
SUT VIC AB 0 CT1 27 (SUTURE) ×1
SUT VIC AB 0 CT1 27XBRD ANBCTR (SUTURE) ×4 IMPLANT
SUT VIC AB 2-0 SH 27 (SUTURE) ×8
SUT VIC AB 2-0 SH 27XBRD (SUTURE) ×32 IMPLANT
SUT VICRYL 0 UR6 27IN ABS (SUTURE) ×5 IMPLANT
TOWEL OR 17X24 6PK STRL BLUE (TOWEL DISPOSABLE) ×10 IMPLANT
TRAY FOLEY CATH 14FR (SET/KITS/TRAYS/PACK) ×5 IMPLANT
TROCAR Z-THREAD FIOS 11X100 BL (TROCAR) ×5 IMPLANT
WARMER LAPAROSCOPE (MISCELLANEOUS) ×5 IMPLANT
WATER STERILE IRR 1000ML POUR (IV SOLUTION) ×5 IMPLANT
YANKAUER SUCT BULB TIP NO VENT (SUCTIONS) ×5 IMPLANT

## 2012-06-11 NOTE — Anesthesia Postprocedure Evaluation (Signed)
  Anesthesia Post-op Note  Patient: Raven [name]  Procedure(s) Performed: Procedure(s) (LRB): TRANSVAGINAL TAPE (TVT) PROCEDURE (N/A) LAPAROSCOPIC TUBAL LIGATION (Bilateral) CYSTOSCOPY (N/A) REMOVAL FOREIGN BODY EXTREMITY (Right)  Patient Location: PACU and Women's Unit  Anesthesia Type: General  Level of Consciousness: awake, alert  and oriented  Airway and Oxygen Therapy: Patient Spontanous Breathing  Post-op Pain: none  Post-op Assessment: Post-op Vital signs reviewed and Patient's Cardiovascular Status Stable  Post-op Vital Signs: Reviewed and stable  Complications: No apparent anesthesia complications

## 2012-06-11 NOTE — Transfer of Care (Signed)
Immediate Anesthesia Transfer of Care Note  Patient: NEVAYA NAGELE  Procedure(s) Performed: Procedure(s) (LRB): TRANSVAGINAL TAPE (TVT) PROCEDURE (N/A) LAPAROSCOPIC TUBAL LIGATION (Bilateral) CYSTOSCOPY (N/A) REMOVAL FOREIGN BODY EXTREMITY (Right)  Patient Location: PACU  Anesthesia Type: General  Level of Consciousness: awake, alert  and oriented  Airway & Oxygen Therapy: Patient Spontanous Breathing and Patient connected to nasal cannula oxygen  Post-op Assessment: Report given to PACU RN and Post -op Vital signs reviewed and stable  Post vital signs: Reviewed and stable  Complications: No apparent anesthesia complications

## 2012-06-11 NOTE — Anesthesia Procedure Notes (Signed)
Procedure Name: Intubation Date/Time: 06/11/2012 11:09 AM Performed by: Graciela Husbands Pre-anesthesia Checklist: Suction available, Emergency Drugs available, Patient identified, Patient being monitored and Timeout performed Patient Re-evaluated:Patient Re-evaluated prior to inductionOxygen Delivery Method: Circle system utilized Preoxygenation: Pre-oxygenation with 100% oxygen Intubation Type: IV induction Ventilation: Mask ventilation without difficulty Laryngoscope Size: Mac and 3 Grade View: Grade I Tube type: Oral Tube size: 7.0 mm Number of attempts: 1 Airway Equipment and Method: Stylet Placement Confirmation: ETT inserted through vocal cords under direct vision,  positive ETCO2 and breath sounds checked- equal and bilateral Secured at: 21 cm Tube secured with: Tape Dental Injury: Teeth and Oropharynx as per pre-operative assessment

## 2012-06-11 NOTE — Anesthesia Preprocedure Evaluation (Signed)
Anesthesia Evaluation  Patient identified by MRN, date of birth, ID band Patient awake    Reviewed: Allergy & Precautions, H&P , Patient's Chart, lab work & pertinent test results, reviewed documented beta blocker date and time   Airway Mallampati: II TM Distance: >3 FB Neck ROM: full    Dental No notable dental hx.    Pulmonary  breath sounds clear to auscultation  Pulmonary exam normal       Cardiovascular - dysrhythmias (PVC's- controlled with BBlockers) Rhythm:regular Rate:Normal     Neuro/Psych    GI/Hepatic Controlled,  Endo/Other    Renal/GU      Musculoskeletal   Abdominal   Peds  Hematology   Anesthesia Other Findings   Reproductive/Obstetrics                           Anesthesia Physical Anesthesia Plan  ASA: II  Anesthesia Plan: General   Post-op Pain Management:    Induction: Intravenous  Airway Management Planned: Oral ETT  Additional Equipment:   Intra-op Plan:   Post-operative Plan:   Informed Consent: I have reviewed the patients History and Physical, chart, labs and discussed the procedure including the risks, benefits and alternatives for the proposed anesthesia with the patient or authorized representative who has indicated his/her understanding and acceptance.   Dental Advisory Given and Dental advisory given  Plan Discussed with: CRNA and Surgeon  Anesthesia Plan Comments: (  Discussed  general anesthesia, including possible nausea, instrumentation of airway, sore throat,pulmonary aspiration, etc. I asked if the were any outstanding questions, or  concerns before we proceeded. )        Anesthesia Quick Evaluation

## 2012-06-11 NOTE — Addendum Note (Signed)
Addendum  created 06/11/12 1601 by Shanon Payor, CRNA   Modules edited:Notes Section

## 2012-06-11 NOTE — Op Note (Signed)
Preop Diagnosis: Stress Urinary Incontinence, Desire for permanent Sterilization   Postop Diagnosis: urinary stress incontinence, sterilization   Procedure: TRANSVAGINAL TAPE (TVT) PROCEDURE CYSTOSCOPY LAPAROSCOPIC TUBAL LIGATION   Anesthesia: General   Anesthesiologist: Jiles Garter, MD   Attending: Purcell Nails, MD   Assistant:  Henreitta Leber, PA-C  Findings: Normal bilateral ovaries and fallopian tubes.  Adhesions noted of right adnexa to side wall.  Pathology: N/a  Fluids: 1700cc  UOP: 300cc  EBL: 50cc  Complications: None  Procedure: The patient was taken to the operating room after the risks, benefits, alternatives, complications, treatment options, and expected outcomes were discussed with the patient. The patient verbalized understanding, the patient concurred with the proposed plan and consent signed and witnessed. The patient was taken to the Operating Room, identified as Raven [name] and the procedure verified as laparoscopic bilateral tubal fulguration. A Time Out was held and the above information confirmed.  The patient was placed under general anesthesia per anesthesia staff, the patient was placed in modified dorsal lithotomy position and was prepped, draped, and catheterized in the normal, sterile fashion.  The cervix was visualized and an intrauterine manipulator was placed. A  10 mm umbilical incision was then performed. Veress needle was passed and pneumoperitoneum was established. A 10 mm trocar was advanced into the intraabdominal cavity, the operative laparoscope was introduced and findings as noted above.  The left fallopian tube was identified and carried out to its fimbriated end and cauterized for at least consecutive burns in the isthmic portion of the tube.  The same was done on the contralateral side.  The laparoscope was removed and pneumoperitoneum released.  The fascia was repaired with an interrupted stitch of 0 vicryl and the  skin was reapproximated with 3-0 monocryl via subcuticular stitch and dermabond was applied.  Attention was then turned to the perineum and weighted speculum placed in the patient's vagina.  The anterior vaginal wall beneath the midurethra and to the lower edge of symphisis pubis was injected with a total of 10cc of dilute pitressin (20u in 50cc Saline).  A 1cm incision was made in anterior vaginal wall beneath the midurethra and underlying tissue dissected away dow to the level of the lower symphysis pubis bilaterally. Attention was then turned to the mons pubis where two 5 mm incisions were made 2 fingerbreadths from the midline. The transabdominal guide was then passed through the mons pubis incision on the patient's right down through the space of Retzius and out through the anterior vaginal wall after deflecting the rigid urethral catheter guide to the ipsilateral side. The same was done on the contralateral side. Cystoscopy was performed and no invadvertant bladder injury was noted. The bladder was drained with a Foley while deflecting the rigid urethral catheter guide to the patient's right and the mesh was attached to the transabdominal guide and elevated up through the space of Retzius and out through the incision on the mons pubis on the ipsilateral side. The same was done on the contralateral side. Cystoscopy was performed again and no inadvertant bladder injury was noted. The 21 French Foley was left in the urethra and a large [name] was placed between the urethra and the mesh in order to leave the mesh slack beneath the midurethra. The mesh was then cut flush with the skin at the mons pubis incisions bilaterally. Indigo carmine had been administered, cystoscopy was performed again and bilateral ureters were noted to efflux without difficulty. The bilateral incisions on the mons pubis  were then cleaned and Dermabond applied. The anterior vaginal wall incision was repaired with 2-0 vicryl with  interrupted stitches.  Vagina was packed with estrogen soaked vaginal packing.    Attention was then turned to the right arm where implanon was palpated.  The area prepped with betadine and incision made and implanon dissected out and removed without difficulty.  Dermabond applied.  Sponge, lap and needle count was correct.  The patient tolerated the procedure well and was returned to the recovery room in good condition.

## 2012-06-11 NOTE — Interval H&P Note (Signed)
History and Physical Interval Note:  06/11/2012 10:42 AM  Raven [name]  has presented today for surgery, with the diagnosis of Stress Urinary Incontinence, Desire for permanent Sterilization  The various methods of treatment have been discussed with the patient and family. After consideration of risks, benefits and other options for treatment, the patient has consented to  Procedure(s) (LRB): TRANSVAGINAL TAPE (TVT) PROCEDURE (N/A) LAPAROSCOPIC TUBAL LIGATION (Bilateral) as a surgical intervention .  The patients' history has been reviewed, patient examined, no change in status, stable for surgery.  I have reviewed the patients' chart and labs.  Questions were answered to the patient's satisfaction.     Raven [name] Y  R/B/A discussed.  Pt would like me to remove implanon today as well.  Implanon is challenging to palpate.  Will attempt to remove but may need to be done under u/s guidance.

## 2012-06-11 NOTE — Anesthesia Postprocedure Evaluation (Signed)
Anesthesia Post Note  Patient: ARTHELLA HEADINGS  Procedure(s) Performed: Procedure(s) (LRB): TRANSVAGINAL TAPE (TVT) PROCEDURE (N/A) LAPAROSCOPIC TUBAL LIGATION (Bilateral) CYSTOSCOPY (N/A) REMOVAL FOREIGN BODY EXTREMITY (Right)  Anesthesia type: General  Patient location: PACU  Post pain: Pain level controlled  Post assessment: Post-op Vital signs reviewed  Last Vitals:  Filed Vitals:   06/11/12 1415  BP: 136/74  Pulse: 71  Temp: 36.7 C  Resp: 15    Post vital signs: Reviewed  Level of consciousness: sedated  Complications: No apparent anesthesia complications

## 2012-06-11 NOTE — H&P (View-Only) (Signed)
Raven [name] is a 31 y.o. female G3P1112 who presents for tubal sterilization and placement of tension free vaginal tape because she no longer desires to bear children and urinary incontinence.  Since the birth of her last child, six years ago, patient has had increasingly worsening urinary urgency and incontinence.  In 2009 the patient underwent cystometrics that demonstrated mixed urinary incontinence. Patient began Detrol LA, Kegel exercise and bladder training exercises however, her symptoms did not respond.  Patient reports urgency, incomplete emptying of bladder, pelvic pressure and discomfort with intercourse related to her bladder.  She has to wear a perineal pad at all times and will leak with and without coughing, sneezing or lifting.  She denies pelvic pain, constipation, diarrhea, dysuria or hematuria.  In the past the patient has used Norplant, Mirena IUD, Depo Provera and now Implanon for contraception.  She has decided that she no longer wants any more children and wants to proceed with tubal sterilization.  Though given both medical and surgical options for managing her incontinence symptoms, patient chooses to proceed with placement of tension free vaginal tape.  Past Medical History  OB History: G3P1112; Spontaneous vaginal birth 2000 & 2007  GYN History: menarche 31 YO    LMP 06/06/12   Contracepton: Implanon;   History of HPV, chlamydia  and HSV-2;  Underwent colposcopy for an abnormal PAP in 2000  Last PAP smear was normal 01/2011  Medical History: vitamin D deficiency, asthma, gastroesophageal reflux disease, achalasia, esophageal stricture, hiatal hernia, depression, anxiety, premature ventricular contractions, pelvic inflammatory disease.  Surgical History:2000  Tonsillectomy-Adenoidectomy;   2001 Right Breast Biopsy-fibroadenoma;  2007 Dilatation and Curettage; 2012 Esophageal Surgery for stricture  Denies problems with anesthesia. Blood transfusion related to childbirth in  2007  Family History: stroke, hypertension, heart attack  Social History:  Single;  Denies illicit drug use; occasionally consumes alcohol and is in the process of quitting cigarettes-smokes one a day  Outpatient Encounter Prescriptions as of 06/08/2012 Medication Sig Dispense Refill . atenolol (TENORMIN) 50 MG tablet Take 1 tablet (50 mg total) by mouth daily.  30 tablet  6 . beclomethasone (QVAR) 80 MCG/ACT inhaler Inhale 1 puff into the lungs 2 (two) times daily as needed. For asthma     . clonazePAM (KLONOPIN) 1 MG tablet Take 1 mg by mouth at bedtime as needed. For anxiety     . PRESCRIPTION MEDICATION Take 500 mg by mouth daily. Pt takes 500mg of oral (liquid) Valtrex that she gets compounded from [facility]     . clotrimazole-betamethasone (LOTRISONE) cream Apply topically daily.  30 g  0 . phentermine 37.5 MG capsule Take 37.5 mg by mouth every morning.       No Known Allergies Denies sensitivity to latex,soy, peanuts, adhesives or shellfish.  ROS:  no glasses/contact lenses, denies headache, vision changes, dysphagia, tinnitus, dizziness,   has chest pain only when PVCs occur , shortness of breath-only with asthma flare, nausea, vomiting, diarrhea, dysuria, hematuria, pelvic pain, swelling of joints,easy bruising,  myalgias, arthralgias, skin rashes and except as is mentioned in the history of present illness, patient's review of systems is otherwise negative    Physical Exam    BP 132/80  Pulse 82  Temp 98 F (36.7 C)  Resp 16  Ht 5' 9" (1.753 m)  Wt 203 lb (92.08 kg)  BMI 29.98 kg/m2  LMP 06/06/2012  Neck: supple no masses Lungs: clear to auscultation Heart: regular rate and rhythm Abdomen:  soft, mild epigastric   tenderness without guarding and no rebound; no organomegaly Pelvic:EGBUS-wnl, vagina with 3/4 cystocele; cervix-no lesions, uterus-normal size without tenderness, adnexae-no tenderness Extremities:  no clubbing, cyanosis or edema   Assesment:  Mixed Urinary Incontinence                     Desire for permanent sterilization   Disposition:  A discussion was held with patient regarding the indication for her procedure(s) along with the risks, which include but are not limited to: reaction to anesthesia, damage to adjacent organs, infection, excessive bleeding., erosion of tension free tape, worsening of urinary symptoms and failure rate of sterilization 2 per 500.  Patient verbalized understanding of these risks and has consented to proceed with placement of tension free vaginal tape and tubal sterilization at [facility] June 11, 2012 at 11:15 a. m.   CSN# [id number]   Jdyn Parkerson J. Marvie Calender, PA-C  for Dr. Angela Y. Roberts   

## 2012-06-12 LAB — CBC
MCH: 30.4 pg (ref 26.0–34.0)
MCHC: 34.2 g/dL (ref 30.0–36.0)
MCV: 88.8 fL (ref 78.0–100.0)
Platelets: 222 10*3/uL (ref 150–400)
RBC: 3.92 MIL/uL (ref 3.87–5.11)
RDW: 13.4 % (ref 11.5–15.5)

## 2012-06-12 MED ORDER — IBUPROFEN 600 MG PO TABS: 600.0000 mg | ORAL_TABLET | Freq: Four times a day (QID) | ORAL | Status: AC | PRN

## 2012-06-12 MED ORDER — CIPROFLOXACIN HCL 250 MG PO TABS: 250.0000 mg | ORAL_TABLET | Freq: Two times a day (BID) | ORAL | Status: AC

## 2012-06-12 MED ORDER — OXYCODONE-ACETAMINOPHEN 5-325 MG PO TABS: 1.0000 | ORAL_TABLET | ORAL | Status: AC | PRN

## 2012-06-12 NOTE — Progress Notes (Signed)
Discharge instructions reviewed with patient.  Patient states understanding of home care and signs/symptoms to report to MD.  No home equipment needed.  Wheelchair to car per Raquel Sarna, RN without incident.  Discharged home with friends.

## 2012-06-12 NOTE — Discharge Instructions (Signed)
Laparoscopic Assisted Vaginal Hysterectomy Care After Refer to this sheet in the next few weeks. These instructions provide you with information on caring for yourself after your procedure. Your caregiver may also give you more specific instructions. Your treatment has been planned according to current medical practices, but problems sometimes occur. Call your caregiver if you have any problems or questions after your procedure. HOME CARE INSTRUCTIONS Healing will take time. You may have discomfort, tenderness, swelling, and bruising at the surgical site for a couple of weeks. This is normal and will get better as time goes on.  Only take over-the-counter or prescription medicines for pain, discomfort, or fever as directed by your caregiver.   Do not take aspirin. It can cause bleeding.   Do not drive when taking pain medicine.   Follow your caregiver's advice regarding diet, exercise, lifting, driving, and general activities.   Resume your usual diet as directed and allowed.   Get plenty of rest and sleep.   Do not douche, use tampons, or have sexual intercourse for at least 6 weeks, or until your caregiver gives you permission.   Change your bandages (dressings) as directed by your caregiver.   Monitor your temperature and notify your caregiver of a fever.   Take showers instead of baths for 2 to 3 weeks.   Do not drink alcohol until your caregiver gives you permission.   If you develop constipation, you may take a mild laxative with your caregiver's permission. Bran foods may help with constipation problems. Drinking enough fluids to keep your urine clear or pale yellow may help as well.   Try to have someone home with you for 1 or 2 weeks to help around the house.   Keep all your follow-up appointments as directed by your caregiver.  SEEK MEDICIAL CARE IF:   You have swelling, redness, or increasing pain in the wound.   You have pus coming from the wound.   You notice a bad  smell coming from the wound or bandage (dressing).   You have swelling, redness, or pain from the intravenous (IV) site.   Your wound breaks open.   You feel dizzy or lightheaded.   You have pain or bleeding when you urinate.   You have persistent diarrhea.   You have persistent nausea and vomiting.   You have abnormal vaginal discharge.   You have a rash.   You have any type of abnormal reaction or develop an allergy to your medicine.   You have poor pain control with your prescribed medicine.  SEEK IMMEDIATE MEDICIAL CARE IF:   You have a fever 100.4 or greater.    You have severe abdominal pain.   You have chest pain.   You have shortness of breath.   You faint.   You have pain, swelling, or redness of your leg.   You have heavy vaginal bleeding with blood clots.  MAKE SURE YOU:  Understand these instructions.   Will watch your condition.   Will get help right away if you are not doing well or get worse.  Document Released: 12/04/2011 Document Reviewed: 12/01/2011 [facility] Patient Information 2012 [place], [place].

## 2012-06-12 NOTE — Discharge Summary (Signed)
Physician Discharge Summary  Patient ID: Raven [name] MRN: [id number] DOB/AGE: 32/02/1980 32 y.o.  Admit date: 06/11/2012 Discharge date: 06/12/2012  Admission Diagnoses: 1.Urinary Incontinence 2.Desires Sterilization 3.Desires Removal of Implanon from Rt Arm  Discharge Diagnoses: Same and s/p 1.TVT 2.Cystoscopy 3.Lap BTL/Fulguration 4.Removal of Implanon from Rt Arm  Active Problems:  * No active hospital problems. *    Discharged Condition: good  Hospital Course: Doing well s/p above procedures. Tolerating regular diet without N/V.  + Flatus.  Ambulating without difficulty.  Voiding spont without difficulty.  Uncomplicated postop course.  Consults: None  Significant Diagnostic Studies: labs: Hgb 11.9 and WBC 16.7  Treatments: s/p surgery, pain management, routine post op care  Discharge Exam: Blood pressure 120/77, pulse 56, temperature 97.8 F (36.6 C), temperature source Oral, resp. rate 18, height 5\' 9"  (1.753 m), weight 205 lb (92.987 kg), SpO2 98.00%. General appearance: alert and no distress Resp: clear to auscultation bilaterally Cardio: regular rate and rhythm GI: soft, app tender, ND, NABS, incisions c/d/i with dermabond (including rt arm where implanon was removed) Extremities: no edema, redness or tenderness in the calves or thighs Minimal vaginal bleeding  Disposition: 01-Home or Self Care  Discharge Orders    Future Appointments: Provider: Department: Dept Phone: Center:   07/20/2012 11:15 AM Purcell Nails, MD [facility] [phone] None     Medication List  As of 06/12/2012  1:13 PM   STOP taking these medications         phentermine 37.5 MG capsule (please do not resume for next 2wks)         TAKE these medications         atenolol 50 MG tablet   Commonly known as: TENORMIN   Take 1 tablet (50 mg total) by mouth daily.      beclomethasone 80 MCG/ACT inhaler   Commonly known as: QVAR   Inhale 1 puff into the lungs 2 (two) times daily as  needed. For asthma      ciprofloxacin 250 MG tablet   Commonly known as: CIPRO   Take 1 tablet (250 mg total) by mouth every 12 (twelve) hours. For 5 days.      clonazePAM 1 MG tablet   Commonly known as: KLONOPIN   Take 1 mg by mouth at bedtime as needed. For anxiety      clotrimazole-betamethasone cream   Commonly known as: LOTRISONE   Apply topically daily.      ibuprofen 600 MG tablet   Commonly known as: ADVIL,MOTRIN   Take 1 tablet (600 mg total) by mouth every 6 (six) hours as needed for pain.      oxyCODONE-acetaminophen 5-325 MG per tablet   Commonly known as: PERCOCET   Take 1-2 tablets by mouth every 4 (four) hours as needed for pain.      PRESCRIPTION MEDICATION   Take 500 mg by mouth daily. Pt takes 500mg  of oral (liquid) Valtrex that she gets compounded from [facility]           Follow-up Information    Follow up with Purcell Nails, MD in 6 weeks. (as scheduled)    Contact information:   [address] [place] [address] [phone]          Signed: Purcell Nails 06/12/2012, 1:13 PM

## 2012-06-14 ENCOUNTER — Encounter (HOSPITAL_COMMUNITY): Payer: Self-pay | Admitting: Obstetrics and Gynecology

## 2012-06-16 MED FILL — Vasopressin Inj 20 Unit/ML: INTRAMUSCULAR | Qty: 10 | Status: AC

## 2012-06-23 ENCOUNTER — Telehealth: Payer: Self-pay | Admitting: Obstetrics and Gynecology

## 2012-06-23 NOTE — Telephone Encounter (Signed)
Triage/epic 

## 2012-06-23 NOTE — Telephone Encounter (Signed)
TC to pt. LM to return call regarding message. 

## 2012-07-06 ENCOUNTER — Other Ambulatory Visit: Payer: Self-pay | Admitting: Otolaryngology

## 2012-07-06 DIAGNOSIS — R1313 Dysphagia, pharyngeal phase: Secondary | ICD-10-CM

## 2012-07-09 ENCOUNTER — Other Ambulatory Visit: Payer: Medicaid Other

## 2012-07-09 ENCOUNTER — Ambulatory Visit
Admission: RE | Admit: 2012-07-09 | Discharge: 2012-07-09 | Disposition: A | Discharge status: Home / Self Care | Payer: Medicaid Other | Source: Ambulatory Visit | Attending: Otolaryngology | Admitting: Otolaryngology

## 2012-07-09 DIAGNOSIS — R1313 Dysphagia, pharyngeal phase: Secondary | ICD-10-CM

## 2012-07-09 MED ORDER — GADOBENATE DIMEGLUMINE 529 MG/ML IV SOLN
20.0000 mL | Freq: Once | INTRAVENOUS | Status: AC | PRN
  Administered 2012-07-09: 20 mL via INTRAVENOUS

## 2012-07-20 ENCOUNTER — Emergency Department (HOSPITAL_COMMUNITY)
Admission: EM | Admit: 2012-07-20 | Discharge: 2012-07-20 | Disposition: A | Discharge status: Home / Self Care | Payer: Medicaid Other | Attending: Emergency Medicine | Admitting: Emergency Medicine

## 2012-07-20 ENCOUNTER — Encounter (HOSPITAL_COMMUNITY): Payer: Self-pay | Admitting: *Deleted

## 2012-07-20 ENCOUNTER — Encounter: Payer: Medicaid Other | Admitting: Obstetrics and Gynecology

## 2012-07-20 ENCOUNTER — Emergency Department (HOSPITAL_COMMUNITY): Payer: Medicaid Other

## 2012-07-20 DIAGNOSIS — N760 Acute vaginitis: Secondary | ICD-10-CM | POA: Insufficient documentation

## 2012-07-20 DIAGNOSIS — B9689 Other specified bacterial agents as the cause of diseases classified elsewhere: Secondary | ICD-10-CM | POA: Insufficient documentation

## 2012-07-20 DIAGNOSIS — F17200 Nicotine dependence, unspecified, uncomplicated: Secondary | ICD-10-CM | POA: Insufficient documentation

## 2012-07-20 DIAGNOSIS — J45909 Unspecified asthma, uncomplicated: Secondary | ICD-10-CM | POA: Insufficient documentation

## 2012-07-20 DIAGNOSIS — A499 Bacterial infection, unspecified: Secondary | ICD-10-CM | POA: Insufficient documentation

## 2012-07-20 DIAGNOSIS — F3289 Other specified depressive episodes: Secondary | ICD-10-CM | POA: Insufficient documentation

## 2012-07-20 DIAGNOSIS — Z202 Contact with and (suspected) exposure to infections with a predominantly sexual mode of transmission: Secondary | ICD-10-CM | POA: Insufficient documentation

## 2012-07-20 DIAGNOSIS — F329 Major depressive disorder, single episode, unspecified: Secondary | ICD-10-CM | POA: Insufficient documentation

## 2012-07-20 LAB — CBC WITH DIFFERENTIAL/PLATELET
Basophils Relative: 0 % (ref 0–1)
Eosinophils Absolute: 0.1 10*3/uL (ref 0.0–0.7)
Eosinophils Relative: 1 % (ref 0–5)
Lymphs Abs: 3.3 10*3/uL (ref 0.7–4.0)
MCH: 30.9 pg (ref 26.0–34.0)
MCHC: 35.3 g/dL (ref 30.0–36.0)
MCV: 87.7 fL (ref 78.0–100.0)
Neutrophils Relative %: 66 % (ref 43–77)
Platelets: 199 10*3/uL (ref 150–400)
RBC: 4.46 MIL/uL (ref 3.87–5.11)

## 2012-07-20 LAB — URINALYSIS, ROUTINE W REFLEX MICROSCOPIC
Bilirubin Urine: NEGATIVE
Ketones, ur: 15 mg/dL — AB
Leukocytes, UA: NEGATIVE
Nitrite: NEGATIVE
Protein, ur: NEGATIVE mg/dL
Urobilinogen, UA: 1 mg/dL (ref 0.0–1.0)

## 2012-07-20 LAB — COMPREHENSIVE METABOLIC PANEL
ALT: 17 U/L (ref 0–35)
Albumin: 4.5 g/dL (ref 3.5–5.2)
BUN: 8 mg/dL (ref 6–23)
Calcium: 10.6 mg/dL — ABNORMAL HIGH (ref 8.4–10.5)
GFR calc Af Amer: >90 mL/min (ref >90)
Glucose, Bld: 105 mg/dL — ABNORMAL HIGH (ref 70–99)
Potassium: 3.8 mEq/L (ref 3.5–5.1)
Sodium: 140 mEq/L (ref 135–145)
Total Protein: 7.8 g/dL (ref 6.0–8.3)

## 2012-07-20 LAB — WET PREP, GENITAL: Yeast Wet Prep HPF POC: NONE SEEN

## 2012-07-20 LAB — GC/CHLAMYDIA PROBE AMP, GENITAL
Chlamydia, DNA Probe: NEGATIVE
GC Probe Amp, Genital: NEGATIVE

## 2012-07-20 LAB — RAPID STREP SCREEN (MED CTR MEBANE ONLY): Streptococcus, Group A Screen (Direct): NEGATIVE

## 2012-07-20 MED ORDER — METRONIDAZOLE 500 MG PO TABS: 500.0000 mg | ORAL_TABLET | Freq: Two times a day (BID) | ORAL | Status: AC

## 2012-07-20 MED ORDER — ALBUTEROL SULFATE HFA 108 (90 BASE) MCG/ACT IN AERS
2.0000 | INHALATION_SPRAY | RESPIRATORY_TRACT | Status: DC | PRN
Start: 2012-07-20 — End: 2012-07-20
  Administered 2012-07-20: 2 via RESPIRATORY_TRACT
  Filled 2012-07-20: qty 6.7

## 2012-07-20 MED ORDER — IBUPROFEN 100 MG/5ML PO SUSP
800.0000 mg | Freq: Once | ORAL | Status: AC
  Administered 2012-07-20: 800 mg via ORAL
  Filled 2012-07-20: qty 40

## 2012-07-20 NOTE — ED Notes (Signed)
The pt is c/o a sorethroat nasal and chest congestion she also has abd pain and painful urination for several days.  lmp July 14th

## 2012-07-20 NOTE — ED Notes (Signed)
NP at bedside.

## 2012-07-20 NOTE — ED Notes (Signed)
Pharmacy notified for ibuprofen suspension

## 2012-07-20 NOTE — ED Provider Notes (Signed)
History     CSN: [id number]  Arrival date & time 07/20/12  0224   None     Chief Complaint  Patient presents with  . multiple symptoms     (Consider location/radiation/quality/duration/timing/severity/associated sxs/prior treatment) Patient is a 31 y.o. female presenting with STD exposure. The history is provided by the patient. No language interpreter was used.  Exposure to STD This is a new problem. The current episode started yesterday. The problem occurs 2 to 4 times per day. The problem has been gradually worsening. Associated symptoms include coughing. Pertinent negatives include no abdominal pain, chills, diaphoresis, fever, nausea or vomiting.   31 year old female frequent flyer coming to the ER today complaining of multiple complaints. States that she had unprotected sex she has a vaginal discharge with suprapubic abdominal pain and dysuria.  Also states that she has a sore throat with a cough. Says she has had a cough and chest pain when she coughs as well. Denies nausea vomiting fever or diarrhea. Has a follow up appointment with gyn later today for procedures done on 06/11/12 bladder suspension and tubal ligation.   Past Medical History  Diagnosis Date  . Achalasia     esphog stretched several times  . Abnormal Pap smear   . Depression   . HSV-2 infection   . Heart palpitations     tx with atenolol by steven klein  . Heart murmur     as child - no prob as adult  . Anxiety   . Blood transfusion 01/2006    [facility]  . GERD (gastroesophageal reflux disease)     no meds - diet controlled  . Asthma     rarely uses inhaler    Past Surgical History  Procedure Date  . Tonsillectomy   . Adenoidectomy 12/2002  . Esophagogastroduodenoscopy     with esophageal dilation x 3  . Dilation and curettage of uterus 1999  . Breast surgery 07/2001    tumor removed right breast  . Dilation and curettage of uterus 01/2006    retained products  . Bladder suspension 06/11/2012   Procedure: TRANSVAGINAL TAPE (TVT) PROCEDURE;  Surgeon: Purcell Nails, MD;  Location: [facility];  Service: Gynecology;  Laterality: N/A;  Tension Free Vaginal Tape/cystoscopy  . Laparoscopic tubal ligation 06/11/2012    Procedure: LAPAROSCOPIC TUBAL LIGATION;  Surgeon: Purcell Nails, MD;  Location: [facility];  Service: Gynecology;  Laterality: Bilateral;  Laparoscopic Bilateral Fulgeration  . Cystoscopy 06/11/2012    Procedure: CYSTOSCOPY;  Surgeon: Purcell Nails, MD;  Location: [facility];  Service: Gynecology;  Laterality: N/A;  . Foreign body removal 06/11/2012    Procedure: REMOVAL FOREIGN BODY EXTREMITY;  Surgeon: Purcell Nails, MD;  Location: [facility];  Service: Gynecology;  Laterality: Right;  Removal of Implanon     Family History  Problem Relation Age of Onset  . Heart disease Father   . Hypertension Father   . Stroke Mother   . Hypertension Mother   . Heart attack Mother   . Breast cancer Mother     History  Substance Use Topics  . Smoking status: Current Some Day Smoker -- 0.2 packs/day    Types: Cigarettes  . Smokeless tobacco: Never Used   Comment: HAS CUT BACK  . Alcohol Use: Yes     on occasion    OB History    Grav Para Term Preterm Abortions TAB SAB Ect Mult Living   3 2 1 1 1      2  Review of Systems  Constitutional: Negative.  Negative for fever, chills and diaphoresis.  HENT: Negative.   Eyes: Negative.   Respiratory: Positive for cough and chest tightness. Negative for shortness of breath and wheezing.   Cardiovascular: Negative.   Gastrointestinal: Negative.  Negative for nausea, vomiting, abdominal pain, diarrhea, constipation and blood in stool.  Neurological: Negative.   Psychiatric/Behavioral: Negative.   All other systems reviewed and are negative.    Allergies  Review of patient's allergies indicates no known allergies.  Home Medications   Current Outpatient Rx  Name Route Sig Dispense Refill  . BECLOMETHASONE DIPROPIONATE 80 MCG/ACT  IN AERS Inhalation Inhale 1 puff into the lungs 2 (two) times daily as needed. For asthma    . CLONAZEPAM 1 MG PO TABS Oral Take 1 mg by mouth at bedtime as needed. For anxiety    . ATENOLOL 50 MG PO TABS Oral Take 1 tablet (50 mg total) by mouth daily. 30 tablet 6    BP 150/88  Pulse 70  Temp 97.7 F (36.5 C) (Oral)  Resp 17  SpO2 98%  LMP 07/11/2012  Physical Exam  Nursing note and vitals reviewed. Constitutional: She is oriented to person, place, and time. She appears well-developed and well-nourished.  HENT:  Head: Normocephalic and atraumatic.  Right Ear: Tympanic membrane normal.  Left Ear: Tympanic membrane normal.  Nose: Nose normal. No mucosal edema or rhinorrhea.  Mouth/Throat: Uvula is midline and mucous membranes are normal. Posterior oropharyngeal erythema present. No posterior oropharyngeal edema or tonsillar abscesses.  Eyes: Conjunctivae and EOM are normal. Pupils are equal, round, and reactive to light.  Neck: Normal range of motion. Neck supple.  Cardiovascular: Normal rate.   Pulmonary/Chest: Effort normal.  Abdominal: Soft.  Genitourinary: Vaginal discharge found.  Musculoskeletal: Normal range of motion. She exhibits no edema and no tenderness.  Neurological: She is alert and oriented to person, place, and time. She has normal reflexes.  Skin: Skin is warm and dry.  Psychiatric: She has a normal mood and affect.    ED Course  Pelvic exam Date/Time: 07/20/2012 7:43 AM Performed by: Remi Haggard Authorized by: Remi Haggard Consent: Verbal consent obtained. Written consent not obtained. Risks and benefits: risks, benefits and alternatives were discussed Consent given by: patient Patient understanding: patient states understanding of the procedure being performed Patient identity confirmed: verbally with patient, arm band, provided demographic data and hospital-assigned identification number Time out: Immediately prior to procedure a "time out" was  called to verify the correct patient, procedure, equipment, support staff and site/side marked as required. Preparation: Patient was prepped and draped in the usual sterile fashion. Local anesthesia used: no Patient sedated: no Patient tolerance: Patient tolerated the procedure well with no immediate complications. Comments: Thick white vag discharge   (including critical care time)  Labs Reviewed  URINALYSIS, ROUTINE W REFLEX MICROSCOPIC - Abnormal; Notable for the following:    APPearance HAZY (*)     Ketones, ur 15 (*)     All other components within normal limits  CBC WITH DIFFERENTIAL - Abnormal; Notable for the following:    WBC 12.0 (*)     Neutro Abs 7.9 (*)     All other components within normal limits  COMPREHENSIVE METABOLIC PANEL - Abnormal; Notable for the following:    Glucose, Bld 105 (*)     Calcium 10.6 (*)     All other components within normal limits  PREGNANCY, URINE  RAPID STREP SCREEN  TROPONIN I   Dg  Chest 2 View  07/20/2012  *RADIOLOGY REPORT*  Clinical Data: Chest pain and congestion.  CHEST - 2 VIEW  Comparison: 03/17/2011.  Findings: The cardiac silhouette, mediastinal and hilar contours are within normal limits and stable.  The lungs are clear.  No pleural effusion.  The bony thorax is intact.  IMPRESSION: No acute cardiopulmonary findings.  No change since prior chest x- rays.  Original Report Authenticated By: P. Loralie Champagne, M.D.     No diagnosis found.    MDM  Multiple complaints with ? bv will treat with flagyl + vaginal discharge.  Ibuprofen Benadry/claritinl for post nasal drip and cough.  Albuterol inhaler.  Keep follow up appointment today with gyn.  -trop -strep wbc 12 - chest x-ray. Labs Reviewed  URINALYSIS, ROUTINE W REFLEX MICROSCOPIC - Abnormal; Notable for the following:    APPearance HAZY (*)     Ketones, ur 15 (*)     All other components within normal limits  CBC WITH DIFFERENTIAL - Abnormal; Notable for the following:     WBC 12.0 (*)     Neutro Abs 7.9 (*)     All other components within normal limits  COMPREHENSIVE METABOLIC PANEL - Abnormal; Notable for the following:    Glucose, Bld 105 (*)     Calcium 10.6 (*)     All other components within normal limits  WET PREP, GENITAL - Abnormal; Notable for the following:    Clue Cells Wet Prep HPF POC FEW (*)     All other components within normal limits  PREGNANCY, URINE  RAPID STREP SCREEN  TROPONIN I  GC/CHLAMYDIA PROBE AMP, GENITAL  LAB REPORT - SCANNED         Remi Haggard, NP 07/21/12 1242  Remi Haggard, NP 07/21/12 1244

## 2012-07-29 NOTE — ED Provider Notes (Signed)
Medical screening examination/treatment/procedure(s) were performed by non-physician practitioner and as supervising physician I was immediately available for consultation/collaboration.   Caston Coopersmith, MD 07/29/12 1501 

## 2012-09-09 ENCOUNTER — Telehealth: Payer: Self-pay | Admitting: Obstetrics and Gynecology

## 2012-09-20 ENCOUNTER — Encounter: Payer: Medicaid Other | Admitting: Obstetrics and Gynecology

## 2012-11-06 ENCOUNTER — Encounter (HOSPITAL_COMMUNITY): Payer: Self-pay | Admitting: Emergency Medicine

## 2012-11-06 ENCOUNTER — Emergency Department (HOSPITAL_COMMUNITY): Payer: Medicaid Other

## 2012-11-06 ENCOUNTER — Emergency Department (HOSPITAL_COMMUNITY)
Admission: EM | Admit: 2012-11-06 | Discharge: 2012-11-06 | Disposition: A | Discharge status: Home / Self Care | Payer: Medicaid Other | Attending: Emergency Medicine | Admitting: Emergency Medicine

## 2012-11-06 DIAGNOSIS — K220 Achalasia of cardia: Secondary | ICD-10-CM | POA: Insufficient documentation

## 2012-11-06 DIAGNOSIS — R079 Chest pain, unspecified: Secondary | ICD-10-CM

## 2012-11-06 DIAGNOSIS — F329 Major depressive disorder, single episode, unspecified: Secondary | ICD-10-CM | POA: Insufficient documentation

## 2012-11-06 DIAGNOSIS — R011 Cardiac murmur, unspecified: Secondary | ICD-10-CM | POA: Insufficient documentation

## 2012-11-06 DIAGNOSIS — B009 Herpesviral infection, unspecified: Secondary | ICD-10-CM | POA: Insufficient documentation

## 2012-11-06 DIAGNOSIS — Z8719 Personal history of other diseases of the digestive system: Secondary | ICD-10-CM | POA: Insufficient documentation

## 2012-11-06 DIAGNOSIS — J45909 Unspecified asthma, uncomplicated: Secondary | ICD-10-CM | POA: Insufficient documentation

## 2012-11-06 DIAGNOSIS — Z79899 Other long term (current) drug therapy: Secondary | ICD-10-CM | POA: Insufficient documentation

## 2012-11-06 DIAGNOSIS — K219 Gastro-esophageal reflux disease without esophagitis: Secondary | ICD-10-CM

## 2012-11-06 DIAGNOSIS — R002 Palpitations: Secondary | ICD-10-CM | POA: Insufficient documentation

## 2012-11-06 DIAGNOSIS — F411 Generalized anxiety disorder: Secondary | ICD-10-CM | POA: Insufficient documentation

## 2012-11-06 DIAGNOSIS — F3289 Other specified depressive episodes: Secondary | ICD-10-CM | POA: Insufficient documentation

## 2012-11-06 DIAGNOSIS — F17200 Nicotine dependence, unspecified, uncomplicated: Secondary | ICD-10-CM | POA: Insufficient documentation

## 2012-11-06 LAB — CBC WITH DIFFERENTIAL/PLATELET
Basophils Absolute: 0 10*3/uL (ref 0.0–0.1)
Basophils Relative: 0 % (ref 0–1)
HCT: 38.9 % (ref 36.0–46.0)
MCHC: 35.2 g/dL (ref 30.0–36.0)
Monocytes Absolute: 0.6 10*3/uL (ref 0.1–1.0)
Neutro Abs: 6.2 10*3/uL (ref 1.7–7.7)
RDW: 12.6 % (ref 11.5–15.5)

## 2012-11-06 LAB — URINALYSIS, ROUTINE W REFLEX MICROSCOPIC
Bilirubin Urine: NEGATIVE
Hgb urine dipstick: NEGATIVE
Protein, ur: NEGATIVE mg/dL
Urobilinogen, UA: 0.2 mg/dL (ref 0.0–1.0)

## 2012-11-06 LAB — COMPREHENSIVE METABOLIC PANEL
ALT: 12 U/L (ref 0–35)
Alkaline Phosphatase: 81 U/L (ref 39–117)
CO2: 25 mEq/L (ref 19–32)
Chloride: 102 mEq/L (ref 96–112)
GFR calc Af Amer: >90 mL/min (ref >90)
GFR calc non Af Amer: >90 mL/min (ref >90)
Glucose, Bld: 88 mg/dL (ref 70–99)
Potassium: 3.6 mEq/L (ref 3.5–5.1)
Sodium: 137 mEq/L (ref 135–145)
Total Bilirubin: 0.4 mg/dL (ref 0.3–1.2)
Total Protein: 7.1 g/dL (ref 6.0–8.3)

## 2012-11-06 LAB — LIPASE, BLOOD: Lipase: 25 U/L (ref 11–59)

## 2012-11-06 MED ORDER — OMEPRAZOLE 2 MG/ML PO SUSP: 20.0000 mg | Freq: Every day | ORAL | Status: DC

## 2012-11-06 MED ORDER — ALUMINUM-MAGNESIUM-SIMETHICONE 200-200-20 MG/5ML PO SUSP: 30.0000 mL | Freq: Three times a day (TID) | ORAL | Status: DC

## 2012-11-06 NOTE — ED Provider Notes (Signed)
History     CSN: [id number]  Arrival date & time 11/06/12  1327   First MD Initiated Contact with Patient 11/06/12 1350      Chief Complaint  Patient presents with  . Chest Pain    (Consider location/radiation/quality/duration/timing/severity/associated sxs/prior treatment) HPI Patient presents with the complaint of midsternal chest pain. She states the pain has been present for ~1 week, is pressure-like, and is worse with inspiration and with food consumption. She states her hydrocodone/ibuprofen improves the pain. She denies any other alleviating or exacerbating factors. The pain does not radiate away from the center of the chest. Also complains of constant SOB which has been present for at least 2 weeks.   Pt also complains of abdominal pain, which is cramping in quality, associated with food intake, and intermittent. She states the pain has been present for at least one week. Pt has history of achalasia with multiple esophageal dilations in the past, the last of which was ~1 year prior per her report. She states that over the last few weeks she has also had sensations of food sticking in her throat and food regurgitation. She also complains of nausea but denies any vomiting or diarrhea. No changes in BM and no melena/hematochezia. Last BM was yesterday, denies constipation/obstipation. Pt has also many other vague and varied complaints on ROS, all of which are chronic.   Past Medical History  Diagnosis Date  . Achalasia     esphog stretched several times  . Abnormal Pap smear   . Depression   . HSV-2 infection   . Heart palpitations     tx with atenolol by steven klein  . Heart murmur     as child - no prob as adult  . Anxiety   . Blood transfusion 01/2006    [facility]  . GERD (gastroesophageal reflux disease)     no meds - diet controlled  . Asthma     rarely uses inhaler    Past Surgical History  Procedure Date  . Tonsillectomy   . Adenoidectomy 12/2002  .  Esophagogastroduodenoscopy     with esophageal dilation x 3  . Dilation and curettage of uterus 1999  . Breast surgery 07/2001    tumor removed right breast  . Dilation and curettage of uterus 01/2006    retained products  . Bladder suspension 06/11/2012    Procedure: TRANSVAGINAL TAPE (TVT) PROCEDURE;  Surgeon: Purcell Nails, MD;  Location: [facility];  Service: Gynecology;  Laterality: N/A;  Tension Free Vaginal Tape/cystoscopy  . Laparoscopic tubal ligation 06/11/2012    Procedure: LAPAROSCOPIC TUBAL LIGATION;  Surgeon: Purcell Nails, MD;  Location: [facility];  Service: Gynecology;  Laterality: Bilateral;  Laparoscopic Bilateral Fulgeration  . Cystoscopy 06/11/2012    Procedure: CYSTOSCOPY;  Surgeon: Purcell Nails, MD;  Location: [facility];  Service: Gynecology;  Laterality: N/A;  . Foreign body removal 06/11/2012    Procedure: REMOVAL FOREIGN BODY EXTREMITY;  Surgeon: Purcell Nails, MD;  Location: [facility];  Service: Gynecology;  Laterality: Right;  Removal of Implanon     Family History  Problem Relation Age of Onset  . Heart disease Father   . Hypertension Father   . Stroke Mother   . Hypertension Mother   . Heart attack Mother   . Breast cancer Mother     History  Substance Use Topics  . Smoking status: Current Some Day Smoker -- 0.2 packs/day    Types: Cigarettes  . Smokeless tobacco: Never Used  Comment: HAS CUT BACK  . Alcohol Use: Yes     Comment: on occasion    OB History    Grav Para Term Preterm Abortions TAB SAB Ect Mult Living   3 2 1 1 1     2       Review of Systems  Constitutional: Negative for fever and chills.  HENT: Positive for trouble swallowing.        Intermittent perioral numbness/paresthesias  Respiratory: Positive for chest tightness and shortness of breath. Negative for cough.   Cardiovascular: Positive for chest pain and palpitations. Negative for leg swelling.  Gastrointestinal: Positive for nausea and abdominal pain. Negative for vomiting,  diarrhea, constipation and blood in stool.  Genitourinary: Positive for dysuria and flank pain. Negative for hematuria and pelvic pain.  Musculoskeletal: Positive for myalgias.  Skin: Negative for rash.  Neurological: Positive for light-headedness, numbness (intermittent numbness/paresthesias in all extremities) and headaches. Negative for seizures and syncope.  Hematological: Negative.   Psychiatric/Behavioral: The patient is nervous/anxious.     Allergies  Review of patient's allergies indicates no known allergies.  Home Medications   Current Outpatient Rx  Name  Route  Sig  Dispense  Refill  . ATENOLOL 50 MG PO TABS   Oral   Take 50 mg by mouth daily.         . BECLOMETHASONE DIPROPIONATE 80 MCG/ACT IN AERS   Inhalation   Inhale 1 puff into the lungs 2 (two) times daily as needed. For asthma         . CLONAZEPAM 1 MG PO TABS   Oral   Take 1 mg by mouth at bedtime as needed. For anxiety         . HYDROCODONE-IBUPROFEN 7.5-200 MG PO TABS   Oral   Take 1 tablet by mouth every 8 (eight) hours as needed. For pain         . LORATADINE 10 MG PO TABS   Oral   Take 10 mg by mouth daily.           BP 122/73  Pulse 74  Temp 97.9 F (36.6 C) (Oral)  Resp 18  SpO2 96%  LMP 11/03/2012  Physical Exam  Constitutional: She is oriented to person, place, and time. She appears well-developed and well-nourished. No distress.  HENT:  Head: Normocephalic and atraumatic.  Eyes: Pupils are equal, round, and reactive to light. No scleral icterus.  Neck: Normal range of motion. No tracheal deviation present.  Cardiovascular: Normal rate and regular rhythm.   No murmur heard. Pulmonary/Chest: Effort normal. She has no wheezes. She has no rales. She exhibits tenderness (tender to palpation along costochondral joints bilaterally).  Abdominal: Soft. Bowel sounds are normal. She exhibits no distension. There is no tenderness. There is CVA tenderness (mild, bilateral).    Musculoskeletal: Normal range of motion. She exhibits no edema.  Neurological: She is alert and oriented to person, place, and time. No cranial nerve deficit.  Skin: Skin is warm and dry. No rash noted.  Psychiatric: Her mood appears anxious.    ED Course  Procedures (including critical care time)   Date: 11/06/2012  Rate: 80  Rhythm: normal sinus rhythm  QRS Axis: normal  Intervals: normal  ST/T Wave abnormalities: normal  Conduction Disutrbances:none  Narrative Interpretation: abnormal R-wave progression, low voltage QRS, both unchanged since previous EKG in 05/2012  Old EKG Reviewed: unchanged   Labs Reviewed  CBC WITH DIFFERENTIAL - Abnormal; Notable for the following:    WBC  10.8 (*)     All other components within normal limits  COMPREHENSIVE METABOLIC PANEL  TROPONIN I  URINALYSIS, ROUTINE W REFLEX MICROSCOPIC  LIPASE, BLOOD   No results found.   1. Chest pain       MDM  CP likely from costochondritis given reproducibility and responsiveness to analgesics. No new EKG changes. Troponin pending. CXR pending. Patient with many and varied complaints, which appear to likely be resultant from her anxiety and panic disorder (particularly given complaints of extremity and perioral numbness/paresthesias, and her hx of recently running out of her PRN klonopin). If CP workup negative, patient will be discharged with instructions to fu with her gastroenterologist for her complaints of dysphagia, which are chronic.         Elfredia Nevins, MD 11/06/12 [id number]

## 2012-11-06 NOTE — ED Notes (Signed)
Pt. Stated, When i eat it feels like its going in my chest instead of my stomach.  I've got weak spells, sometimes sharp pain.

## 2012-11-06 NOTE — ED Notes (Signed)
Pt. Also states she is having sharp chest pains.

## 2012-11-06 NOTE — ED Notes (Signed)
Dr.Plunkett to eval ecg at 13:50

## 2012-11-06 NOTE — ED Provider Notes (Signed)
EKG Rate 79, nl axis Low voltage QRS Abnormal ECG  I saw and evaluated the patient, reviewed the resident's note and I agree with the findings and plan. Atypical for cardiac etiology.  Suspect related to gi etiology.  At this time there does not appear to be any evidence of an acute emergency medical condition and the patient appears stable for discharge with appropriate outpatient follow up.    Celene Kras, MD 11/06/12 1539

## 2013-01-09 ENCOUNTER — Inpatient Hospital Stay (HOSPITAL_COMMUNITY)
Admission: AD | Admit: 2013-01-09 | Discharge: 2013-01-09 | Disposition: A | Discharge status: Hospice | Payer: Medicaid Other | Source: Ambulatory Visit | Attending: Obstetrics and Gynecology | Admitting: Obstetrics and Gynecology

## 2013-01-09 ENCOUNTER — Encounter (HOSPITAL_COMMUNITY): Payer: Self-pay | Admitting: *Deleted

## 2013-01-09 ENCOUNTER — Inpatient Hospital Stay (HOSPITAL_COMMUNITY): Payer: Medicaid Other

## 2013-01-09 DIAGNOSIS — Z87898 Personal history of other specified conditions: Secondary | ICD-10-CM | POA: Insufficient documentation

## 2013-01-09 DIAGNOSIS — R1032 Left lower quadrant pain: Secondary | ICD-10-CM | POA: Insufficient documentation

## 2013-01-09 DIAGNOSIS — N940 Mittelschmerz: Secondary | ICD-10-CM

## 2013-01-09 DIAGNOSIS — N938 Other specified abnormal uterine and vaginal bleeding: Secondary | ICD-10-CM | POA: Insufficient documentation

## 2013-01-09 DIAGNOSIS — Z202 Contact with and (suspected) exposure to infections with a predominantly sexual mode of transmission: Secondary | ICD-10-CM

## 2013-01-09 DIAGNOSIS — Z8742 Personal history of other diseases of the female genital tract: Secondary | ICD-10-CM | POA: Insufficient documentation

## 2013-01-09 DIAGNOSIS — M545 Low back pain, unspecified: Secondary | ICD-10-CM | POA: Insufficient documentation

## 2013-01-09 DIAGNOSIS — N949 Unspecified condition associated with female genital organs and menstrual cycle: Secondary | ICD-10-CM

## 2013-01-09 DIAGNOSIS — R350 Frequency of micturition: Secondary | ICD-10-CM | POA: Insufficient documentation

## 2013-01-09 DIAGNOSIS — N923 Ovulation bleeding: Secondary | ICD-10-CM

## 2013-01-09 DIAGNOSIS — R300 Dysuria: Secondary | ICD-10-CM

## 2013-01-09 DIAGNOSIS — N925 Other specified irregular menstruation: Secondary | ICD-10-CM

## 2013-01-09 DIAGNOSIS — Z8619 Personal history of other infectious and parasitic diseases: Secondary | ICD-10-CM | POA: Insufficient documentation

## 2013-01-09 HISTORY — DX: Personal history of other diseases of the female genital tract: Z87.42

## 2013-01-09 HISTORY — DX: Personal history of other infectious and parasitic diseases: Z86.19

## 2013-01-09 HISTORY — DX: Personal history of other specified conditions: Z87.898

## 2013-01-09 HISTORY — DX: Unspecified osteoarthritis, unspecified site: M19.90

## 2013-01-09 LAB — URINALYSIS, ROUTINE W REFLEX MICROSCOPIC
Bilirubin Urine: NEGATIVE
Glucose, UA: NEGATIVE mg/dL
Ketones, ur: NEGATIVE mg/dL
pH: 6 (ref 5.0–8.0)

## 2013-01-09 LAB — CBC
Hemoglobin: 14.1 g/dL (ref 12.0–15.0)
RBC: 4.43 MIL/uL (ref 3.87–5.11)
WBC: 11.1 10*3/uL — ABNORMAL HIGH (ref 4.0–10.5)

## 2013-01-09 LAB — WET PREP, GENITAL
Trich, Wet Prep: NONE SEEN
WBC, Wet Prep HPF POC: NONE SEEN
Yeast Wet Prep HPF POC: NONE SEEN

## 2013-01-09 LAB — URINE MICROSCOPIC-ADD ON

## 2013-01-09 MED ORDER — CEFTRIAXONE SODIUM 1 G IJ SOLR
1.0000 g | INTRAMUSCULAR | Status: DC
  Administered 2013-01-09: 1 g via INTRAMUSCULAR
  Filled 2013-01-09: qty 10

## 2013-01-09 MED ORDER — AZITHROMYCIN 1 G PO PACK
1.0000 g | PACK | Freq: Once | ORAL | Status: AC
  Administered 2013-01-09: 1 g via ORAL
  Filled 2013-01-09: qty 1

## 2013-01-09 NOTE — MAU Note (Signed)
Used boric acid suppos for the past three days and she woke up with light pink vag bleeding and is now red bleeding but only when she wipes. Says her  Vagina is raw, burning and itchy and that began at 1800 tonight.

## 2013-01-09 NOTE — MAU Provider Note (Signed)
Chief Complaint: Abdominal Pain  First Provider Initiated Contact with Patient 01/09/13 1959      SUBJECTIVE HPI: Raven [name] is a 32 y.o. [id number] non-pregnant female who presents with: 1. LLQ pain x 1 week. Rates 7/10 on pain scale.  2. Spotting x a few days. 3. Burning  w/ urination in the urethra and externally x a few days. 4. Bilat low back pain x a few days.  LMP 12/24/13. No Hx intermenstrual spotting, regular monthly cycles. Took boric acid suppositories x three days thinking that she may have BV. No improvement. Denies fever, chills, flank pain, urgency, frequency, N/V/D/C. Last BM today, normal. New sex partner x ~1 month. Inconsistent condom use. No IC since Sx started. Hx PID in 90's. Unsure when last Pap was done. Declines pain meds.    Past Medical History  Diagnosis Date  . Achalasia     esphog stretched several times  . Abnormal Pap smear   . Depression   . HSV-2 infection   . Heart palpitations     tx with atenolol by steven klein  . Heart murmur     as child - no prob as adult  . Anxiety   . Blood transfusion 01/2006    [facility]  . GERD (gastroesophageal reflux disease)     no meds - diet controlled  . Asthma     rarely uses inhaler  . Arthritis   . History of abnormal Pap smear   . History of chlamydia 1990's   OB History    Grav Para Term Preterm Abortions TAB SAB Ect Mult Living   3 2 1 1 1     2      # Outc Date GA Lbr Len/2nd Wgt Sex Del Anes PTL Lv   1 TRM            2 PRE            3 ABT              Past Surgical History  Procedure Date  . Tonsillectomy   . Adenoidectomy 12/2002  . Esophagogastroduodenoscopy     with esophageal dilation x 3  . Dilation and curettage of uterus 1999  . Breast surgery 07/2001    tumor removed right breast  . Dilation and curettage of uterus 01/2006    retained products  . Bladder suspension 06/11/2012    Procedure: TRANSVAGINAL TAPE (TVT) PROCEDURE;  Surgeon: Purcell Nails, MD;  Location: [facility];  Service:  Gynecology;  Laterality: N/A;  Tension Free Vaginal Tape/cystoscopy  . Laparoscopic tubal ligation 06/11/2012    Procedure: LAPAROSCOPIC TUBAL LIGATION;  Surgeon: Purcell Nails, MD;  Location: [facility];  Service: Gynecology;  Laterality: Bilateral;  Laparoscopic Bilateral Fulgeration  . Cystoscopy 06/11/2012    Procedure: CYSTOSCOPY;  Surgeon: Purcell Nails, MD;  Location: [facility];  Service: Gynecology;  Laterality: N/A;  . Foreign body removal 06/11/2012    Procedure: REMOVAL FOREIGN BODY EXTREMITY-Nexplanon;  Surgeon: Purcell Nails, MD;  Location: [facility];  Service: Gynecology;  Laterality: Right;  Removal of Implanon    History   Social History  . Marital Status: Single    Spouse Name: N/A    Number of Children: N/A  . Years of Education: N/A   Occupational History  . student    Social History Main Topics  . Smoking status: Current Some Day Smoker -- 0.2 packs/day    Types: Cigarettes  . Smokeless tobacco: Never Used  Comment: HAS CUT BACK  . Alcohol Use: Yes     Comment: on occasion  . Drug Use: No  . Sexually Active: Yes    Birth Control/ Protection: Surgical   Other Topics Concern  . Not on file   Social History Narrative  . No narrative on file   No current facility-administered medications on file prior to encounter.   Current Outpatient Prescriptions on File Prior to Encounter  Medication Sig Dispense Refill  . aluminum-magnesium hydroxide-simethicone (MAALOX) 200-200-20 MG/5ML SUSP Take 30 mLs by mouth 4 (four) times daily -  before meals and at bedtime.  1120 mL  1  . atenolol (TENORMIN) 50 MG tablet Take 50 mg by mouth daily.      . beclomethasone (QVAR) 80 MCG/ACT inhaler Inhale 1 puff into the lungs 2 (two) times daily as needed. For asthma      . clonazePAM (KLONOPIN) 1 MG tablet Take 1 mg by mouth at bedtime as needed. For anxiety      . HYDROcodone-ibuprofen (VICOPROFEN) 7.5-200 MG per tablet Take 1 tablet by mouth every 8 (eight) hours as needed. For  pain      . loratadine (CLARITIN) 10 MG tablet Take 10 mg by mouth daily.      . Omeprazole 2 MG/ML SUSP Take 20 mg by mouth daily.  300 mL  1   No Known Allergies  ROS: Pertinent items in HPI  OBJECTIVE Blood pressure 145/75, pulse 92, temperature 97.5 F (36.4 C), temperature source Oral, resp. rate 18, height 5\' 9"  (1.753 m), weight 88.633 kg (195 lb 6.4 oz), last menstrual period 12/24/2012. GENERAL: Well-developed, well-nourished female in no acute distress.  HEENT: Normocephalic HEART: normal rate RESP: normal effort ABDOMEN: Soft, mild left groin tenderness EXTREMITIES: Nontender, no edema NEURO: Alert and oriented SPECULUM EXAM: NEFG except for 2 mm dark, flat, lesion and 1 mm raised flesh-colored lesion (? Condyloma) on perineum, small amount of dark red blood, normal odor in vault, coming from os. No mucopurulent discharge. Cervix has cobblestone appearance, non-friable. BIMANUAL: cervix closed; uterus normal size, left adnexal tenderness. No masses palpated. Mild CMT.  LAB RESULTS Results for orders placed during the hospital encounter of 01/09/13 (from the past 24 hour(s))  URINALYSIS, ROUTINE W REFLEX MICROSCOPIC     Status: Abnormal   Collection Time   01/09/13  7:02 PM      Component Value Range   Color, Urine YELLOW  YELLOW   APPearance CLEAR  CLEAR   Specific Gravity, Urine 1.020  1.005 - 1.030   pH 6.0  5.0 - 8.0   Glucose, UA NEGATIVE  NEGATIVE mg/dL   Hgb urine dipstick LARGE (*) NEGATIVE   Bilirubin Urine NEGATIVE  NEGATIVE   Ketones, ur NEGATIVE  NEGATIVE mg/dL   Protein, ur NEGATIVE  NEGATIVE mg/dL   Urobilinogen, UA 0.2  0.0 - 1.0 mg/dL   Nitrite NEGATIVE  NEGATIVE   Leukocytes, UA NEGATIVE  NEGATIVE  URINE MICROSCOPIC-ADD ON     Status: Abnormal   Collection Time   01/09/13  7:02 PM      Component Value Range   Squamous Epithelial / LPF FEW (*) RARE   WBC, UA 0-2  <3 WBC/hpf   RBC / HPF 3-6  <3 RBC/hpf   Bacteria, UA FEW (*) RARE  WET PREP,  GENITAL     Status: Abnormal   Collection Time   01/09/13  8:00 PM      Component Value Range   Yeast Wet Prep  HPF POC NONE SEEN  NONE SEEN   Trich, Wet Prep NONE SEEN  NONE SEEN   Clue Cells Wet Prep HPF POC FEW (*) NONE SEEN   WBC, Wet Prep HPF POC NONE SEEN  NONE SEEN  CBC     Status: Abnormal   Collection Time   01/09/13  8:07 PM      Component Value Range   WBC 11.1 (*) 4.0 - 10.5 K/uL   RBC 4.43  3.87 - 5.11 MIL/uL   Hemoglobin 14.1  12.0 - 15.0 g/dL   HCT 40.9  81.1 - 91.4 %   MCV 91.0  78.0 - 100.0 fL   MCH 31.8  26.0 - 34.0 pg   MCHC 35.0  30.0 - 36.0 g/dL   RDW 78.2  95.6 - 21.3 %   Platelets 190  150 - 400 K/uL  POCT PREGNANCY, URINE     Status: Normal   Collection Time   01/09/13  9:00 PM      Component Value Range   Preg Test, Ur NEGATIVE  NEGATIVE    IMAGING No results found.  MAU COURSE 2210: Per consult w/ Dr. Pennie Rushing, offered pt Tx now for suspected GC/CT vs waiting for results. Pt agrees to Tx now. Cannot swallow pills. Will give rocephin and Azithromycin slurry.   ASSESSMENT 1. Intermenstrual spotting   2. Possible exposure to STD   3. [place]     PLAN Discharge home Ibuprofen for pain PRN. No sex until results of GC/CT cultures back. Partner needs Tx if pos, then no sex x 1 week. Always use condoms.  GC/CT urine cultures pending.      Follow-up Information    Follow up with [facility]. In 2 weeks.   Contact information:   [address] [place] [address] [phone]          Medication List     As of 01/09/2013 10:38 PM    TAKE these medications         aluminum-magnesium hydroxide-simethicone 200-200-20 MG/5ML Susp   Commonly known as: MAALOX   Take 30 mLs by mouth 4 (four) times daily -  before meals and at bedtime.      atenolol 50 MG tablet   Commonly known as: TENORMIN   Take 50 mg by mouth daily.      beclomethasone 80 MCG/ACT inhaler   Commonly known  as: QVAR   Inhale 1 puff into the lungs 2 (two) times daily as needed. For asthma      clonazePAM 1 MG tablet   Commonly known as: KLONOPIN   Take 1 mg by mouth at bedtime as needed. For anxiety      HYDROcodone-ibuprofen 7.5-200 MG per tablet   Commonly known as: VICOPROFEN   Take 1 tablet by mouth every 8 (eight) hours as needed. For pain      loratadine 10 MG tablet   Commonly known as: CLARITIN   Take 10 mg by mouth daily.      Omeprazole 2 MG/ML Susp   Take 20 mg by mouth daily.         [place], [place] 01/09/2013  10:38 PM

## 2013-01-09 NOTE — MAU Note (Signed)
Pt reports she started having LLQ pain  Last week. Started spotted a few days ago. Thought she might have a bacterial infection and took boric acid suppositories.  Burning with urination and pain is worse.

## 2013-01-10 LAB — GC/CHLAMYDIA PROBE AMP: CT Probe RNA: NEGATIVE

## 2013-05-29 DIAGNOSIS — J189 Pneumonia, unspecified organism: Secondary | ICD-10-CM | POA: Insufficient documentation

## 2013-05-29 HISTORY — DX: Pneumonia, unspecified organism: J18.9

## 2013-07-30 ENCOUNTER — Encounter (HOSPITAL_COMMUNITY): Payer: Self-pay | Admitting: *Deleted

## 2013-07-30 ENCOUNTER — Inpatient Hospital Stay (HOSPITAL_COMMUNITY)
Admission: AD | Admit: 2013-07-30 | Discharge: 2013-07-30 | Disposition: A | Discharge status: Home / Self Care | Payer: Medicaid Other | Source: Ambulatory Visit | Attending: Obstetrics & Gynecology | Admitting: Obstetrics & Gynecology

## 2013-07-30 ENCOUNTER — Inpatient Hospital Stay (HOSPITAL_COMMUNITY): Payer: Medicaid Other

## 2013-07-30 DIAGNOSIS — N949 Unspecified condition associated with female genital organs and menstrual cycle: Secondary | ICD-10-CM | POA: Insufficient documentation

## 2013-07-30 DIAGNOSIS — R102 Pelvic and perineal pain: Secondary | ICD-10-CM

## 2013-07-30 DIAGNOSIS — Z202 Contact with and (suspected) exposure to infections with a predominantly sexual mode of transmission: Secondary | ICD-10-CM

## 2013-07-30 DIAGNOSIS — N760 Acute vaginitis: Secondary | ICD-10-CM | POA: Insufficient documentation

## 2013-07-30 DIAGNOSIS — Z9189 Other specified personal risk factors, not elsewhere classified: Secondary | ICD-10-CM

## 2013-07-30 DIAGNOSIS — R109 Unspecified abdominal pain: Secondary | ICD-10-CM | POA: Insufficient documentation

## 2013-07-30 DIAGNOSIS — B9689 Other specified bacterial agents as the cause of diseases classified elsewhere: Secondary | ICD-10-CM

## 2013-07-30 DIAGNOSIS — A499 Bacterial infection, unspecified: Secondary | ICD-10-CM | POA: Insufficient documentation

## 2013-07-30 LAB — URINE MICROSCOPIC-ADD ON

## 2013-07-30 LAB — URINALYSIS, ROUTINE W REFLEX MICROSCOPIC
Bilirubin Urine: NEGATIVE
Leukocytes, UA: NEGATIVE
Nitrite: NEGATIVE
Specific Gravity, Urine: 1.025 (ref 1.005–1.030)
Urobilinogen, UA: 0.2 mg/dL (ref 0.0–1.0)
pH: 6 (ref 5.0–8.0)

## 2013-07-30 LAB — CBC
MCH: 31.7 pg (ref 26.0–34.0)
MCV: 90.1 fL (ref 78.0–100.0)
Platelets: 205 10*3/uL (ref 150–400)
RDW: 12.7 % (ref 11.5–15.5)
WBC: 11 10*3/uL — ABNORMAL HIGH (ref 4.0–10.5)

## 2013-07-30 LAB — WET PREP, GENITAL: Yeast Wet Prep HPF POC: NONE SEEN

## 2013-07-30 MED ORDER — CEFTRIAXONE SODIUM 250 MG IJ SOLR
250.0000 mg | INTRAMUSCULAR | Status: AC
  Administered 2013-07-30: 250 mg via INTRAMUSCULAR
  Filled 2013-07-30: qty 250

## 2013-07-30 MED ORDER — METRONIDAZOLE 0.75 % VA GEL: 1.0000 | Freq: Every day | VAGINAL | Status: DC

## 2013-07-30 MED ORDER — AZITHROMYCIN 1 G PO PACK
1.0000 g | PACK | ORAL | Status: AC
  Administered 2013-07-30: 1 g via ORAL
  Filled 2013-07-30: qty 1

## 2013-07-30 NOTE — MAU Provider Note (Signed)
Chief Complaint: Abdominal Pain   First Provider Initiated Contact with Patient 07/30/13 0435     SUBJECTIVE HPI: Raven [name] is a 32 y.o. [id number] who presents to maternity admissions reporting abdominal pain described as low, cramping, mostly on right side x2 days. She took 3 HPT and 1 was positive, one equivocal, and one negative. Pt reports that her boyfriend has had dysuria for 2-3 days.  When asked about exposure to STD, pt reports that he told her he was getting "taken care of" but she is unsure.   Patient's last menstrual period was 07/10/2013.  She denies vaginal bleeding, vaginal itching/burning, urinary symptoms, h/a, dizziness, n/v, or fever/chills.     Past Medical History  Diagnosis Date  . Achalasia     esphog stretched several times  . Abnormal Pap smear   . Depression   . HSV-2 infection   . Heart palpitations     tx with atenolol by steven klein  . Heart murmur     as child - no prob as adult  . Anxiety   . Blood transfusion 01/2006    [facility]  . GERD (gastroesophageal reflux disease)     no meds - diet controlled  . Asthma     rarely uses inhaler  . Arthritis   . History of abnormal Pap smear   . History of chlamydia 1990's   Past Surgical History  Procedure Laterality Date  . Tonsillectomy    . Adenoidectomy  12/2002  . Esophagogastroduodenoscopy      with esophageal dilation x 3  . Dilation and curettage of uterus  1999  . Breast surgery  07/2001    tumor removed right breast  . Dilation and curettage of uterus  01/2006    retained products  . Bladder suspension  06/11/2012    Procedure: TRANSVAGINAL TAPE (TVT) PROCEDURE;  Surgeon: Purcell Nails, MD;  Location: [facility];  Service: Gynecology;  Laterality: N/A;  Tension Free Vaginal Tape/cystoscopy  . Laparoscopic tubal ligation  06/11/2012    Procedure: LAPAROSCOPIC TUBAL LIGATION;  Surgeon: Purcell Nails, MD;  Location: [facility];  Service: Gynecology;  Laterality: Bilateral;  Laparoscopic Bilateral  Fulgeration  . Cystoscopy  06/11/2012    Procedure: CYSTOSCOPY;  Surgeon: Purcell Nails, MD;  Location: [facility];  Service: Gynecology;  Laterality: N/A;  . Foreign body removal  06/11/2012    Procedure: REMOVAL FOREIGN BODY EXTREMITY;  Surgeon: Purcell Nails, MD;  Location: [facility];  Service: Gynecology;  Laterality: Right;  Removal of Implanon    History   Social History  . Marital Status: Single    Spouse Name: N/A    Number of Children: N/A  . Years of Education: N/A   Occupational History  . student    Social History Main Topics  . Smoking status: Current Some Day Smoker -- 0.25 packs/day    Types: Cigarettes  . Smokeless tobacco: Never Used     Comment: HAS CUT BACK  . Alcohol Use: Yes     Comment: on occasion  . Drug Use: No  . Sexually Active: Yes    Birth Control/ Protection: Surgical     Comment: IMPLANON   Other Topics Concern  . Not on file   Social History Narrative  . No narrative on file   No current facility-administered medications on file prior to encounter.   Current Outpatient Prescriptions on File Prior to Encounter  Medication Sig Dispense Refill  . aluminum-magnesium hydroxide-simethicone (MAALOX) 200-200-20 MG/5ML  SUSP Take 30 mLs by mouth 4 (four) times daily -  before meals and at bedtime.  1120 mL  1  . atenolol (TENORMIN) 50 MG tablet Take 50 mg by mouth daily.      . beclomethasone (QVAR) 80 MCG/ACT inhaler Inhale 1 puff into the lungs 2 (two) times daily as needed. For asthma      . clonazePAM (KLONOPIN) 1 MG tablet Take 1 mg by mouth at bedtime as needed. For anxiety      . HYDROcodone-ibuprofen (VICOPROFEN) 7.5-200 MG per tablet Take 1 tablet by mouth every 8 (eight) hours as needed. For pain      . loratadine (CLARITIN) 10 MG tablet Take 10 mg by mouth daily.      . Omeprazole 2 MG/ML SUSP Take 20 mg by mouth daily.  300 mL  1   No Known Allergies  ROS: Pertinent items in HPI  OBJECTIVE Blood pressure 120/76, pulse 98, temperature  97.9 F (36.6 C), resp. rate 20, height 5\' 9"  (1.753 m), weight 84.641 kg (186 lb 9.6 oz), last menstrual period 07/10/2013. GENERAL: Well-developed, well-nourished female in no acute distress.  HEENT: Normocephalic HEART: normal rate RESP: normal effort ABDOMEN: Soft, non-tender EXTREMITIES: Nontender, no edema NEURO: Alert and oriented Pelvic exam: Cervix pink, visually closed, without lesion, scant white creamy discharge, vaginal walls and external genitalia normal Bimanual exam: Cervix 0/long/high, firm, posterior, positive CMT, uterus mildly tender, nonenlarged, adnexa without tenderness, enlargement, or mass  LAB RESULTS Results for orders placed during the hospital encounter of 07/30/13 (from the past 24 hour(s))  URINALYSIS, ROUTINE W REFLEX MICROSCOPIC     Status: Abnormal   Collection Time    07/30/13  4:20 AM      Result Value Range   Color, Urine YELLOW  YELLOW   APPearance CLEAR  CLEAR   Specific Gravity, Urine 1.025  1.005 - 1.030   pH 6.0  5.0 - 8.0   Glucose, UA NEGATIVE  NEGATIVE mg/dL   Hgb urine dipstick TRACE (*) NEGATIVE   Bilirubin Urine NEGATIVE  NEGATIVE   Ketones, ur NEGATIVE  NEGATIVE mg/dL   Protein, ur NEGATIVE  NEGATIVE mg/dL   Urobilinogen, UA 0.2  0.0 - 1.0 mg/dL   Nitrite NEGATIVE  NEGATIVE   Leukocytes, UA NEGATIVE  NEGATIVE  URINE MICROSCOPIC-ADD ON     Status: Abnormal   Collection Time    07/30/13  4:20 AM      Result Value Range   Squamous Epithelial / LPF FEW (*) RARE   WBC, UA 0-2  <3 WBC/hpf   RBC / HPF 0-2  <3 RBC/hpf   Bacteria, UA RARE  RARE  HCG, QUANTITATIVE, PREGNANCY     Status: None   Collection Time    07/30/13  4:40 AM      Result Value Range   hCG, Beta Chain, Quant, S 1  <5 mIU/mL  CBC     Status: Abnormal   Collection Time    07/30/13  4:40 AM      Result Value Range   WBC 11.0 (*) 4.0 - 10.5 K/uL   RBC 4.35  3.87 - 5.11 MIL/uL   Hemoglobin 13.8  12.0 - 15.0 g/dL   HCT 98.1  19.1 - 47.8 %   MCV 90.1  78.0 - 100.0  fL   MCH 31.7  26.0 - 34.0 pg   MCHC 35.2  30.0 - 36.0 g/dL   RDW 29.5  62.1 - 30.8 %   Platelets 205  150 - 400 K/uL  WET PREP, GENITAL     Status: Abnormal   Collection Time    07/30/13  4:50 AM      Result Value Range   Yeast Wet Prep HPF POC NONE SEEN  NONE SEEN   Trich, Wet Prep NONE SEEN  NONE SEEN   Clue Cells Wet Prep HPF POC MODERATE (*) NONE SEEN   WBC, Wet Prep HPF POC FEW (*) NONE SEEN    IMAGING US Transvaginal Non-ob  07/30/2013   *RADIOLOGY REPORT*  Clinical Data: Right lower quadrant pain, cervical motion tenderness.  TRANSABDOMINAL AND TRANSVAGINAL ULTRASOUND OF PELVIS Technique:  Both transabdominal and transvaginal ultrasound examinations of the pelvis were performed. Transabdominal technique was performed for global imaging of the pelvis including uterus, ovaries, adnexal regions, and pelvic cul-de-sac.  It was necessary to proceed with endovaginal exam following the transabdominal exam to visualize the right ovary.  Comparison:  None  Findings:  Uterus: Normal in appearance.  8 x 5 x 5 cm.  Endometrium: Homogeneous, 8 mm in thickness.  Right ovary:  2.6 x 2.8 x 2.1 cm.  Normal appearance.  No adnexal mass.  Left ovary: 2.2 x 2.1 x 1.2 cm.  Normal appearance.  No adnexal mass.  Other findings: No free fluid  IMPRESSION: Normal study. No evidence of pelvic mass or other significant abnormality.   Original Report Authenticated By: Tiburcio Pea   US Pelvis Complete  07/30/2013   *RADIOLOGY REPORT*  Clinical Data: Right lower quadrant pain, cervical motion tenderness.  TRANSABDOMINAL AND TRANSVAGINAL ULTRASOUND OF PELVIS Technique:  Both transabdominal and transvaginal ultrasound examinations of the pelvis were performed. Transabdominal technique was performed for global imaging of the pelvis including uterus, ovaries, adnexal regions, and pelvic cul-de-sac.  It was necessary to proceed with endovaginal exam following the transabdominal exam to visualize the right ovary.   Comparison:  None  Findings:  Uterus: Normal in appearance.  8 x 5 x 5 cm.  Endometrium: Homogeneous, 8 mm in thickness.  Right ovary:  2.6 x 2.8 x 2.1 cm.  Normal appearance.  No adnexal mass.  Left ovary: 2.2 x 2.1 x 1.2 cm.  Normal appearance.  No adnexal mass.  Other findings: No free fluid  IMPRESSION: Normal study. No evidence of pelvic mass or other significant abnormality.   Original Report Authenticated By: Tiburcio Pea    ASSESSMENT 1. Possible exposure to STD   2. Pelvic pain in female   3. Bacterial vaginosis     PLAN Rocephin 250 mg IM and azithromycin 1g PO given in MAU Discharge home Flagyl 500 mg BID x7 days Recommend partner treatment and no intercourse x2 weeks following his treatment to prevent reinfection F/U in Gyn clinic for routine Gyn care (message sent to clinic) Return to MAU as needed    Medication List    ASK your doctor about these medications       aluminum-magnesium hydroxide-simethicone 200-200-20 MG/5ML Susp  Commonly known as:  MAALOX  Take 30 mLs by mouth 4 (four) times daily -  before meals and at bedtime.     atenolol 50 MG tablet  Commonly known as:  TENORMIN  Take 50 mg by mouth daily.     beclomethasone 80 MCG/ACT inhaler  Commonly known as:  QVAR  Inhale 1 puff into the lungs 2 (two) times daily as needed. For asthma     clonazePAM 1 MG tablet  Commonly known as:  KLONOPIN  Take 1 mg by mouth at  bedtime as needed. For anxiety     HYDROcodone-ibuprofen 7.5-200 MG per tablet  Commonly known as:  VICOPROFEN  Take 1 tablet by mouth every 8 (eight) hours as needed. For pain     loratadine 10 MG tablet  Commonly known as:  CLARITIN  Take 10 mg by mouth daily.     Omeprazole 2 MG/ML Susp  Take 20 mg by mouth daily.         Sharen Counter Certified Nurse-Midwife 07/30/2013  4:52 AM

## 2013-07-30 NOTE — Progress Notes (Signed)
Collene Gobble -Craige Cotta CNM notified of pt's lab and u/s results. Stable for d/c home.

## 2013-07-30 NOTE — MAU Note (Signed)
LMP 7/13. I had my tubes tied last year. I've been having abdominal pain for last 2 days. No bleeding or d/c. I took preg test and got 1 postitive and 2 negative results. My boyfriend has been having some burinng when he pees so if he says something

## 2013-07-30 NOTE — Progress Notes (Signed)
Written and verbal d/c instructions given and understanding voiced. 

## 2013-08-02 ENCOUNTER — Emergency Department (HOSPITAL_COMMUNITY)
Admission: EM | Admit: 2013-08-02 | Discharge: 2013-08-02 | Disposition: A | Discharge status: Home / Self Care | Payer: Medicaid Other | Attending: Emergency Medicine | Admitting: Emergency Medicine

## 2013-08-02 ENCOUNTER — Emergency Department (HOSPITAL_COMMUNITY): Payer: Medicaid Other

## 2013-08-02 ENCOUNTER — Encounter (HOSPITAL_COMMUNITY): Payer: Self-pay | Admitting: *Deleted

## 2013-08-02 DIAGNOSIS — F329 Major depressive disorder, single episode, unspecified: Secondary | ICD-10-CM | POA: Insufficient documentation

## 2013-08-02 DIAGNOSIS — F3289 Other specified depressive episodes: Secondary | ICD-10-CM | POA: Insufficient documentation

## 2013-08-02 DIAGNOSIS — Z8619 Personal history of other infectious and parasitic diseases: Secondary | ICD-10-CM | POA: Insufficient documentation

## 2013-08-02 DIAGNOSIS — Z8739 Personal history of other diseases of the musculoskeletal system and connective tissue: Secondary | ICD-10-CM | POA: Insufficient documentation

## 2013-08-02 DIAGNOSIS — M779 Enthesopathy, unspecified: Secondary | ICD-10-CM

## 2013-08-02 DIAGNOSIS — Z8719 Personal history of other diseases of the digestive system: Secondary | ICD-10-CM | POA: Insufficient documentation

## 2013-08-02 DIAGNOSIS — G5602 Carpal tunnel syndrome, left upper limb: Secondary | ICD-10-CM

## 2013-08-02 DIAGNOSIS — Z79899 Other long term (current) drug therapy: Secondary | ICD-10-CM | POA: Insufficient documentation

## 2013-08-02 DIAGNOSIS — F17200 Nicotine dependence, unspecified, uncomplicated: Secondary | ICD-10-CM | POA: Insufficient documentation

## 2013-08-02 DIAGNOSIS — Z8679 Personal history of other diseases of the circulatory system: Secondary | ICD-10-CM | POA: Insufficient documentation

## 2013-08-02 DIAGNOSIS — M65839 Other synovitis and tenosynovitis, unspecified forearm: Secondary | ICD-10-CM | POA: Insufficient documentation

## 2013-08-02 DIAGNOSIS — J45909 Unspecified asthma, uncomplicated: Secondary | ICD-10-CM | POA: Insufficient documentation

## 2013-08-02 DIAGNOSIS — F411 Generalized anxiety disorder: Secondary | ICD-10-CM | POA: Insufficient documentation

## 2013-08-02 DIAGNOSIS — G5600 Carpal tunnel syndrome, unspecified upper limb: Secondary | ICD-10-CM | POA: Insufficient documentation

## 2013-08-02 HISTORY — DX: Fibromyalgia: M79.7

## 2013-08-02 NOTE — ED Notes (Signed)
Pt is here with left wrist pain and has pain for last couple of weeks
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
## 2013-08-03 NOTE — ED Provider Notes (Signed)
Medical screening examination/treatment/procedure(s) were performed by non-physician practitioner and as supervising physician I was immediately available for consultation/collaboration.   Enid Skeens, MD 08/03/13 [phone]

## 2013-09-19 ENCOUNTER — Encounter: Payer: Medicaid Other | Admitting: Nurse Practitioner

## 2013-11-03 ENCOUNTER — Other Ambulatory Visit: Payer: Self-pay

## 2013-12-02 ENCOUNTER — Ambulatory Visit (INDEPENDENT_AMBULATORY_CARE_PROVIDER_SITE_OTHER): Payer: Medicaid Other | Admitting: Cardiology

## 2013-12-02 ENCOUNTER — Encounter: Payer: Self-pay | Admitting: Cardiology

## 2013-12-02 VITALS — BP 131/88 | HR 79 | Ht 69.0 in | Wt 191.8 lb

## 2013-12-02 DIAGNOSIS — I4949 Other premature depolarization: Secondary | ICD-10-CM

## 2013-12-02 DIAGNOSIS — I493 Ventricular premature depolarization: Secondary | ICD-10-CM

## 2013-12-02 DIAGNOSIS — R002 Palpitations: Secondary | ICD-10-CM

## 2013-12-02 LAB — MAGNESIUM: Magnesium: 2 mg/dL (ref 1.5–2.5)

## 2013-12-02 LAB — CBC WITH DIFFERENTIAL/PLATELET
Basophils Absolute: 0 10*3/uL (ref 0.0–0.1)
Eosinophils Absolute: 0.1 10*3/uL (ref 0.0–0.7)
Lymphocytes Relative: 34.3 % (ref 12.0–46.0)
Lymphs Abs: 3.1 10*3/uL (ref 0.7–4.0)
Monocytes Absolute: 0.6 10*3/uL (ref 0.1–1.0)
Neutro Abs: 5.3 10*3/uL (ref 1.4–7.7)
RBC: 4.5 Mil/uL (ref 3.87–5.11)
WBC: 9.1 10*3/uL (ref 4.5–10.5)

## 2013-12-02 LAB — BASIC METABOLIC PANEL
BUN: 9 mg/dL (ref 6–23)
Calcium: 9.3 mg/dL (ref 8.4–10.5)
Creatinine, Ser: 0.7 mg/dL (ref 0.4–1.2)
GFR: 120.94 mL/min (ref >60.00)

## 2013-12-02 LAB — TSH: TSH: 0.45 u[IU]/mL (ref 0.35–5.50)

## 2013-12-02 MED ORDER — ATENOLOL 50 MG PO TABS: 50.0000 mg | ORAL_TABLET | Freq: Every day | ORAL | Status: DC

## 2013-12-02 NOTE — Patient Instructions (Addendum)
Your physician has requested that you have an echocardiogram. Echocardiography is a painless test that uses sound waves to create images of your heart. It provides your doctor with information about the size and shape of your heart and how well your heart's chambers and valves are working. This procedure takes approximately one hour. There are no restrictions for this procedure.  Your physician has recommended that you wear an 30 days event monitor. Event monitors are medical devices that record the heart's electrical activity. Doctors most often [place] these monitors to diagnose arrhythmias. Arrhythmias are problems with the speed or rhythm of the heartbeat. The monitor is a small, portable device. You can wear one while you do your normal daily activities. This is usually used to diagnose what is causing palpitations/syncope (passing out).  Your physician recommends that you schedule a follow-up appointment in: WITH DR. Graciela Husbands IN 5 WEEKS   Your physician recommends that you return for lab work in: CBC, BMET, MAGNESIUM, TSH TODAY  Your physician recommends that you continue on your current medications as directed. Please refer to the Current Medication list given to you today.  WE WILL DISCUSS YOUR RESTART FOR YOUR ATENOLOL AFTER YOUR MONITOR RESULTS

## 2013-12-02 NOTE — Progress Notes (Signed)
ELECTROPHYSIOLOGY OFFICE NOTE  Patient ID: Raven [name] MRN: [id number], DOB/AGE: 33/02/1980   Date of Visit: 12/02/2013  Primary Physician: Karie Chimera, MD Primary Cardiologist: Berton Mount, MD Reason for Visit: Palpitations  History of Present Illness  Raven [name] is a 32 y.o. female with history of symptomatic PVCs and anxiety who presents today for followup. She was last seen in May 2012.   She reports increased anxiety and palpitations x 2-3 weeks. She describes both "racing and flip flopping" palpitations that occur with and without activity. She has "sharp" CP and SOB with her palpitations. She has also had occasional dizziness. She denies near syncope or syncope. She denies LE swelling, orthopnea or PND. She was taking atenolol previously but stopped one year ago on her own. She has had increased stressors recently and feels overwhelmed working full time as a single mother of 2. She denies recent illness, fever or chills. She denies any recent changes to her health other than the diagnosis of fibromyalgia. Her PCP is following her for chronic pain at multiple sites including back, arms, wrists, ankles and feet.  Past Medical History Past Medical History  Diagnosis Date  . Achalasia     esphog stretched several times  . Abnormal Pap smear   . Depression   . HSV-2 infection   . Heart palpitations     tx with atenolol by steven klein  . Heart murmur     as child - no prob as adult  . Anxiety   . Blood transfusion 01/2006    [facility]  . GERD (gastroesophageal reflux disease)     no meds - diet controlled  . Asthma     rarely uses inhaler  . Arthritis   . History of abnormal Pap smear   . History of chlamydia 1990's  . Fibromyalgia     Past Surgical History Past Surgical History  Procedure Laterality Date  . Tonsillectomy    . Adenoidectomy  12/2002  . Esophagogastroduodenoscopy      with esophageal dilation x 3  . Dilation and curettage of uterus  1999  . Breast  surgery  07/2001    tumor removed right breast  . Dilation and curettage of uterus  01/2006    retained products  . Bladder suspension  06/11/2012    Procedure: TRANSVAGINAL TAPE (TVT) PROCEDURE;  Surgeon: Purcell Nails, MD;  Location: [facility];  Service: Gynecology;  Laterality: N/A;  Tension Free Vaginal Tape/cystoscopy  . Laparoscopic tubal ligation  06/11/2012    Procedure: LAPAROSCOPIC TUBAL LIGATION;  Surgeon: Purcell Nails, MD;  Location: [facility];  Service: Gynecology;  Laterality: Bilateral;  Laparoscopic Bilateral Fulgeration  . Cystoscopy  06/11/2012    Procedure: CYSTOSCOPY;  Surgeon: Purcell Nails, MD;  Location: [facility];  Service: Gynecology;  Laterality: N/A;  . Foreign body removal  06/11/2012    Procedure: REMOVAL FOREIGN BODY EXTREMITY;  Surgeon: Purcell Nails, MD;  Location: [facility];  Service: Gynecology;  Laterality: Right;  Removal of Implanon     Allergies/Intolerances No Known Allergies  Current Home Medications Current Outpatient Prescriptions  Medication Sig Dispense Refill  . amitriptyline (ELAVIL) 100 MG tablet Take 100 mg by mouth at bedtime.      Marland Kitchen atenolol (TENORMIN) 50 MG tablet Take 50 mg by mouth daily.      . beclomethasone (QVAR) 80 MCG/ACT inhaler Inhale 2 puffs into the lungs every 6 (six) hours as needed (for wheezing and shortness of breath). For  asthma      . clonazePAM (KLONOPIN) 1 MG tablet Take 1 mg by mouth at bedtime as needed. For anxiety      . Cyclobenzaprine HCl (FLEXERIL PO) Take by mouth as needed.      Marland Kitchen EPINEPHrine (EPIPEN) 0.3 mg/0.3 mL SOAJ injection Inject 0.3 mg into the muscle once as needed (for allergies).      Marland Kitchen HYDROcodone-ibuprofen (VICOPROFEN) 7.5-200 MG per tablet Take 1 tablet by mouth every 8 (eight) hours as needed. For pain      . PRESCRIPTION MEDICATION Inject 2 Units into the muscle every 30 (thirty) days. Allergy shot.  One in each arm on the 11th of the month.       No current facility-administered medications for  this visit.    Family History Mom - living with h/o CABG in 35s Dad - living with h/o HTN Sister - living with h/o HTN Negative for SCD  Social History History   Social History  . Marital Status: Single    Spouse Name: N/A    Number of Children: N/A  . Years of Education: N/A   Occupational History  . student    Social History Main Topics  . Smoking status: Current Some Day Smoker -- 0.25 packs/day    Types: Cigarettes  . Smokeless tobacco: Never Used     Comment: HAS CUT BACK  . Alcohol Use: Yes     Comment: on occasion  . Drug Use: No  . Sexual Activity: Yes    Birth Control/ Protection: Surgical     Comment: IMPLANON   Other Topics Concern  . Not on file   Social History Narrative  . No narrative on file     Review of Systems General: No chills, fever, night sweats or weight changes Cardiovascular: No chest pain, dyspnea on exertion, edema, orthopnea, paroxysmal nocturnal dyspnea Dermatological: No rash, lesions or masses Respiratory: No cough, dyspnea Urologic: No hematuria, dysuria Abdominal: No nausea, vomiting, diarrhea, bright red blood per rectum, melena, or hematemesis Neurologic: No visual changes, weakness, changes in mental status All other systems reviewed and are otherwise negative except as noted above.  Physical Exam Vitals: Blood pressure 131/88, pulse 79, height 5\' 9"  (1.753 m), weight 191 lb 12.8 oz (87 kg).  General: Well developed, well appearing 32 y.o. female in no acute distress. HEENT: Normocephalic, atraumatic. EOMs intact. Sclera nonicteric. Oropharynx clear.  Neck: Supple. No JVD. Lungs: Respirations regular and unlabored, CTA bilaterally. No wheezes, rales or rhonchi. Heart: RRR. S1, S2 present. No murmurs, rub, S3 or S4. Abdomen: Soft, non-tender, non-distended. BS present x 4 quadrants. No hepatosplenomegaly.  Extremities: No clubbing, cyanosis or edema. PT/Radials 2+ and equal bilaterally. Psych: Normal affect. Neuro: Alert  and oriented X 3. Moves all extremities spontaneously.   Diagnostics 12-lead ECG today - NSR at 84 bpm; PR 160, QRS 84, QT/QTc 388/458 msec; no ST-T wave abnormalities  Assessment and Plan 1. Palpitations - known h/o symptomatic PVCs and was previously taking atenolol; however, she describes new "racing" palpitations which are accompanied by "sharp" CP, SOB and lightheadedness  - will order a 30-day event monitor and echo in addition to CBC, BMET, Mg and TSH - return for follow-up in 5 weeks  Signed, Mercy Malena, PA-C 12/02/2013, 12:21 PM

## 2013-12-05 ENCOUNTER — Encounter: Payer: Self-pay | Admitting: *Deleted

## 2013-12-05 ENCOUNTER — Ambulatory Visit (HOSPITAL_COMMUNITY): Payer: Medicaid Other | Attending: Cardiology | Admitting: Radiology

## 2013-12-05 ENCOUNTER — Encounter: Payer: Self-pay | Admitting: Cardiology

## 2013-12-05 ENCOUNTER — Encounter (INDEPENDENT_AMBULATORY_CARE_PROVIDER_SITE_OTHER): Payer: Medicaid Other

## 2013-12-05 DIAGNOSIS — I493 Ventricular premature depolarization: Secondary | ICD-10-CM

## 2013-12-05 DIAGNOSIS — J45909 Unspecified asthma, uncomplicated: Secondary | ICD-10-CM | POA: Insufficient documentation

## 2013-12-05 DIAGNOSIS — R072 Precordial pain: Secondary | ICD-10-CM

## 2013-12-05 DIAGNOSIS — R002 Palpitations: Secondary | ICD-10-CM

## 2013-12-05 DIAGNOSIS — I4949 Other premature depolarization: Secondary | ICD-10-CM

## 2013-12-05 DIAGNOSIS — F17200 Nicotine dependence, unspecified, uncomplicated: Secondary | ICD-10-CM | POA: Insufficient documentation

## 2013-12-05 DIAGNOSIS — R011 Cardiac murmur, unspecified: Secondary | ICD-10-CM

## 2013-12-05 DIAGNOSIS — Z8249 Family history of ischemic heart disease and other diseases of the circulatory system: Secondary | ICD-10-CM | POA: Insufficient documentation

## 2013-12-05 DIAGNOSIS — R079 Chest pain, unspecified: Secondary | ICD-10-CM | POA: Insufficient documentation

## 2013-12-05 DIAGNOSIS — Q248 Other specified congenital malformations of heart: Secondary | ICD-10-CM | POA: Insufficient documentation

## 2013-12-05 DIAGNOSIS — R0602 Shortness of breath: Secondary | ICD-10-CM

## 2013-12-05 NOTE — Progress Notes (Signed)
Patient ID: Raven [name], female   DOB: 08/01/1980, 32 y.o.   MRN: [id number] E-Cardio verite 30 day cardiac event monitor applied to patient.

## 2013-12-05 NOTE — Progress Notes (Signed)
Echocardiogram performed.  

## 2013-12-09 ENCOUNTER — Telehealth: Payer: Self-pay | Admitting: *Deleted

## 2013-12-09 NOTE — Telephone Encounter (Signed)
Note from [place]: This patient is currently wearing an event monitor for palpitations. 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 There is no prior echo for review but we presume this is new. Please call her to inform her of results of echo and explain that it is important she keep her scheduled follow-up on 01/09/2013 with Dr. Graciela Husbands to discuss results and plan of care.  Thank you,  Brooke   I called Raven [name] and discussed findings. Made her aware to keep f/u with Dr. Graciela Husbands next month to discuss plan. Patient agreeable to plan.

## 2013-12-19 ENCOUNTER — Other Ambulatory Visit: Payer: Self-pay

## 2013-12-19 ENCOUNTER — Emergency Department (HOSPITAL_COMMUNITY)
Admission: EM | Admit: 2013-12-19 | Discharge: 2013-12-19 | Disposition: A | Discharge status: Home / Self Care | Payer: Medicaid Other | Attending: Emergency Medicine | Admitting: Emergency Medicine

## 2013-12-19 ENCOUNTER — Telehealth: Payer: Self-pay | Admitting: Internal Medicine

## 2013-12-19 ENCOUNTER — Encounter (HOSPITAL_COMMUNITY): Payer: Self-pay | Admitting: Emergency Medicine

## 2013-12-19 DIAGNOSIS — R002 Palpitations: Secondary | ICD-10-CM | POA: Insufficient documentation

## 2013-12-19 DIAGNOSIS — Z3202 Encounter for pregnancy test, result negative: Secondary | ICD-10-CM | POA: Insufficient documentation

## 2013-12-19 DIAGNOSIS — F329 Major depressive disorder, single episode, unspecified: Secondary | ICD-10-CM | POA: Insufficient documentation

## 2013-12-19 DIAGNOSIS — F411 Generalized anxiety disorder: Secondary | ICD-10-CM | POA: Insufficient documentation

## 2013-12-19 DIAGNOSIS — F3289 Other specified depressive episodes: Secondary | ICD-10-CM | POA: Insufficient documentation

## 2013-12-19 DIAGNOSIS — Z8719 Personal history of other diseases of the digestive system: Secondary | ICD-10-CM | POA: Insufficient documentation

## 2013-12-19 DIAGNOSIS — F17200 Nicotine dependence, unspecified, uncomplicated: Secondary | ICD-10-CM | POA: Insufficient documentation

## 2013-12-19 DIAGNOSIS — Z8619 Personal history of other infectious and parasitic diseases: Secondary | ICD-10-CM | POA: Insufficient documentation

## 2013-12-19 DIAGNOSIS — Z8739 Personal history of other diseases of the musculoskeletal system and connective tissue: Secondary | ICD-10-CM | POA: Insufficient documentation

## 2013-12-19 DIAGNOSIS — Z9089 Acquired absence of other organs: Secondary | ICD-10-CM | POA: Insufficient documentation

## 2013-12-19 DIAGNOSIS — R011 Cardiac murmur, unspecified: Secondary | ICD-10-CM | POA: Insufficient documentation

## 2013-12-19 DIAGNOSIS — Z79899 Other long term (current) drug therapy: Secondary | ICD-10-CM | POA: Insufficient documentation

## 2013-12-19 DIAGNOSIS — J45909 Unspecified asthma, uncomplicated: Secondary | ICD-10-CM | POA: Insufficient documentation

## 2013-12-19 LAB — URINALYSIS, ROUTINE W REFLEX MICROSCOPIC
Bilirubin Urine: NEGATIVE
Hgb urine dipstick: NEGATIVE
Ketones, ur: NEGATIVE mg/dL
Nitrite: NEGATIVE
Urobilinogen, UA: 0.2 mg/dL (ref 0.0–1.0)

## 2013-12-19 LAB — CBC WITH DIFFERENTIAL/PLATELET
Basophils Absolute: 0 10*3/uL (ref 0.0–0.1)
Eosinophils Absolute: 0.1 10*3/uL (ref 0.0–0.7)
Hemoglobin: 12.9 g/dL (ref 12.0–15.0)
Lymphocytes Relative: 37 % (ref 12–46)
Lymphs Abs: 3.3 10*3/uL (ref 0.7–4.0)
MCH: 32.2 pg (ref 26.0–34.0)
Monocytes Relative: 8 % (ref 3–12)
Neutro Abs: 4.8 10*3/uL (ref 1.7–7.7)
Neutrophils Relative %: 54 % (ref 43–77)
RBC: 4.01 MIL/uL (ref 3.87–5.11)
WBC: 8.9 10*3/uL (ref 4.0–10.5)

## 2013-12-19 LAB — COMPREHENSIVE METABOLIC PANEL
AST: 23 U/L (ref 0–37)
Alkaline Phosphatase: 70 U/L (ref 39–117)
BUN: 9 mg/dL (ref 6–23)
CO2: 24 mEq/L (ref 19–32)
Chloride: 103 mEq/L (ref 96–112)
GFR calc Af Amer: >90 mL/min (ref >90)
Glucose, Bld: 104 mg/dL — ABNORMAL HIGH (ref 70–99)
Potassium: 3.5 mEq/L (ref 3.5–5.1)
Sodium: 139 mEq/L (ref 135–145)
Total Bilirubin: 0.2 mg/dL — ABNORMAL LOW (ref 0.3–1.2)

## 2013-12-19 LAB — POCT PREGNANCY, URINE: Preg Test, Ur: NEGATIVE

## 2013-12-19 NOTE — Telephone Encounter (Signed)
New Message  Pt states that her condition has gotten worse. Symptoms include palpitataions// medication Not working aternerol.. Dr. At ER believes that is the reason why her BP is Low.Marland Kitchen

## 2013-12-19 NOTE — ED Provider Notes (Addendum)
CSN: [id number]     Arrival date & time 12/19/13  0133 History   First MD Initiated Contact with Patient 12/19/13 [phone]     Chief Complaint  Patient presents with  . Palpitations   (Consider location/radiation/quality/duration/timing/severity/associated sxs/prior Treatment) HPI This patient is a 32 yo woman who notes a history of heart murmur and 14 year history of heart palpitations. She presents with the statement "y'all have to do something about these palpitations". Patient is observed on the cardiac monitor during our conversation and noted to have random PVCs - about 1 every 2 to 3 minutes.  She identifies episodes of PVCs as cause of her sx. She denies SOB, near syncope, lightheadedness, chest pain.   She says she just can't stand it any longer because they PVCs make her feel bad. Of note, the patient  Says that she is followed by Dr. Graciela Husbands and has been wearing a continous outpatient cardiac monitor for the past 3 weeks but, decided to take it off. She had a TTE performed last week which was notable for an EF of 45% .  I do not see evidence of a previous TTE  Review of her chart. She has f/u with Dr. Graciela Husbands in 2 weeks to discuss.   She has recently been prescribed Atenolol 50mg  qd by Dr. Graciela Husbands in an attempt to reduce the frequency of her PVCs.  She says this is not helping.   Past Medical History  Diagnosis Date  . Achalasia     esphog stretched several times  . Abnormal Pap smear   . Depression   . HSV-2 infection   . Heart palpitations     tx with atenolol by steven klein  . Heart murmur     as child - no prob as adult  . Anxiety   . Blood transfusion 01/2006    [facility]  . GERD (gastroesophageal reflux disease)     no meds - diet controlled  . Asthma     rarely uses inhaler  . Arthritis   . History of abnormal Pap smear   . History of chlamydia 1990's  . Fibromyalgia    Past Surgical History  Procedure Laterality Date  . Tonsillectomy    . Adenoidectomy  12/2002  .  Esophagogastroduodenoscopy      with esophageal dilation x 3  . Dilation and curettage of uterus  1999  . Breast surgery  07/2001    tumor removed right breast  . Dilation and curettage of uterus  01/2006    retained products  . Bladder suspension  06/11/2012    Procedure: TRANSVAGINAL TAPE (TVT) PROCEDURE;  Surgeon: Purcell Nails, MD;  Location: [facility];  Service: Gynecology;  Laterality: N/A;  Tension Free Vaginal Tape/cystoscopy  . Laparoscopic tubal ligation  06/11/2012    Procedure: LAPAROSCOPIC TUBAL LIGATION;  Surgeon: Purcell Nails, MD;  Location: [facility];  Service: Gynecology;  Laterality: Bilateral;  Laparoscopic Bilateral Fulgeration  . Cystoscopy  06/11/2012    Procedure: CYSTOSCOPY;  Surgeon: Purcell Nails, MD;  Location: [facility];  Service: Gynecology;  Laterality: N/A;  . Foreign body removal  06/11/2012    Procedure: REMOVAL FOREIGN BODY EXTREMITY;  Surgeon: Purcell Nails, MD;  Location: [facility];  Service: Gynecology;  Laterality: Right;  Removal of Implanon    Family History  Problem Relation Age of Onset  . Heart disease Father   . Hypertension Father   . Stroke Mother   . Hypertension Mother   .  Heart attack Mother   . Breast cancer Mother   . Neuropathy Mother   . Arthritis Mother   . Neuropathy Sister   . Arthritis Sister    History  Substance Use Topics  . Smoking status: Current Some Day Smoker -- 0.25 packs/day    Types: Cigarettes  . Smokeless tobacco: Never Used     Comment: HAS CUT BACK  . Alcohol Use: Yes     Comment: on occasion   OB History   Grav Para Term Preterm Abortions TAB SAB Ect Mult Living   4 2 2  0 2  2   2      Review of Systems Ten point review of symptoms performed and is negative with the exception of symptoms noted above.   Allergies  Review of patient's allergies indicates no known allergies.  Home Medications   Current Outpatient Rx  Name  Route  Sig  Dispense  Refill  . amitriptyline (ELAVIL) 100 MG tablet   Oral    Take 100 mg by mouth at bedtime.         Marland Kitchen atenolol (TENORMIN) 50 MG tablet   Oral   Take 1 tablet (50 mg total) by mouth daily.   30 tablet   1   . beclomethasone (QVAR) 80 MCG/ACT inhaler   Inhalation   Inhale 2 puffs into the lungs every 6 (six) hours as needed (for wheezing and shortness of breath). For asthma         . clonazePAM (KLONOPIN) 1 MG tablet   Oral   Take 1 mg by mouth at bedtime as needed. For anxiety         . HYDROcodone-ibuprofen (VICOPROFEN) 7.5-200 MG per tablet   Oral   Take 1 tablet by mouth every 8 (eight) hours as needed. For pain         . PRESCRIPTION MEDICATION   Intramuscular   Inject 2 Units into the muscle every 30 (thirty) days. Allergy shot.  One in each arm on the 11th of the month.         . EPINEPHrine (EPIPEN) 0.3 mg/0.3 mL SOAJ injection   Intramuscular   Inject 0.3 mg into the muscle once as needed (for allergies).          BP 164/104  Pulse 114  Temp(Src) 98.2 F (36.8 C) (Oral)  Resp 18  SpO2 98%  LMP 11/22/2013 Physical Exam Gen: well developed and well nourished appearing Head: NCAT Eyes: PERL, EOMI Nose: no epistaixis or rhinorrhea Mouth/throat: mucosa is moist and pink Neck: supple, no stridor Lungs: CTA B, no wheezing, rhonchi or rales CV: regular rate and rythm, good distal pulses.  Abd: soft, notender, nondistended Back: no ttp, no cva ttp Skin: warm and dry Ext: no edema, normal to inspection Neuro: CN ii-xii grossly intact, no focal deficits Psyche; agitated affect,  Yelled at me for the entire 72m during which I was in her room, conversation witnessed by the patient's nurse.   ED Course  Procedures (including critical care time) Labs Review  Results for orders placed during the hospital encounter of 12/19/13 (from the past 24 hour(s))  URINALYSIS, ROUTINE W REFLEX MICROSCOPIC     Status: Abnormal   Collection Time    12/19/13  1:45 AM      Result Value Range   Color, Urine YELLOW  YELLOW    APPearance CLOUDY (*) CLEAR   Specific Gravity, Urine 1.014  1.005 - 1.030   pH 8.5 (*) 5.0 -  8.0   Glucose, UA NEGATIVE  NEGATIVE mg/dL   Hgb urine dipstick NEGATIVE  NEGATIVE   Bilirubin Urine NEGATIVE  NEGATIVE   Ketones, ur NEGATIVE  NEGATIVE mg/dL   Protein, ur NEGATIVE  NEGATIVE mg/dL   Urobilinogen, UA 0.2  0.0 - 1.0 mg/dL   Nitrite NEGATIVE  NEGATIVE   Leukocytes, UA NEGATIVE  NEGATIVE  CBC WITH DIFFERENTIAL     Status: None   Collection Time    12/19/13  1:56 AM      Result Value Range   WBC 8.9  4.0 - 10.5 K/uL   RBC 4.01  3.87 - 5.11 MIL/uL   Hemoglobin 12.9  12.0 - 15.0 g/dL   HCT 11.9  14.7 - 82.9 %   MCV 92.3  78.0 - 100.0 fL   MCH 32.2  26.0 - 34.0 pg   MCHC 34.9  30.0 - 36.0 g/dL   RDW 56.2  13.0 - 86.5 %   Platelets 184  150 - 400 K/uL   Neutrophils Relative % 54  43 - 77 %   Neutro Abs 4.8  1.7 - 7.7 K/uL   Lymphocytes Relative 37  12 - 46 %   Lymphs Abs 3.3  0.7 - 4.0 K/uL   Monocytes Relative 8  3 - 12 %   Monocytes Absolute 0.7  0.1 - 1.0 K/uL   Eosinophils Relative 1  0 - 5 %   Eosinophils Absolute 0.1  0.0 - 0.7 K/uL   Basophils Relative 0  0 - 1 %   Basophils Absolute 0.0  0.0 - 0.1 K/uL  COMPREHENSIVE METABOLIC PANEL     Status: Abnormal   Collection Time    12/19/13  1:56 AM      Result Value Range   Sodium 139  135 - 145 mEq/L   Potassium 3.5  3.5 - 5.1 mEq/L   Chloride 103  96 - 112 mEq/L   CO2 24  19 - 32 mEq/L   Glucose, Bld 104 (*) 70 - 99 mg/dL   BUN 9  6 - 23 mg/dL   Creatinine, Ser 7.84  0.50 - 1.10 mg/dL   Calcium 9.2  8.4 - 69.6 mg/dL   Total Protein 7.2  6.0 - 8.3 g/dL   Albumin 4.0  3.5 - 5.2 g/dL   AST 23  0 - 37 U/L   ALT 17  0 - 35 U/L   Alkaline Phosphatase 70  39 - 117 U/L   Total Bilirubin 0.2 (*) 0.3 - 1.2 mg/dL   GFR calc non Af Amer >90  >90 mL/min   GFR calc Af Amer >90  >90 mL/min  POCT PREGNANCY, URINE     Status: None   Collection Time    12/19/13  2:11 AM      Result Value Range   Preg Test, Ur NEGATIVE   NEGATIVE   Patient refuses EKG  MDM  ED work up is non-diagnostic. Patient describes undergoing an extensive outpatient work up for her sx and is currently under the care of a cardiologist.  She has been observed on the cardiac monitor in the ED for quite a while and has exhibited NSR with an occasional PVC. No runs of PVCs and no multi-focal PVCs.  The patient has told me that she wants a prescription for an antibiotic because all of her children have a runny throat and cough. I have told her that an antibiotic is not indicated for her. She became upset with that  response.   I have reassured her that, although PVCs may cause her to feel uncomfortable or anxious, they are in no way life threatening. I have recommended that she follow up with her cardiologist, Dr. Graciela Husbands and call his office later in the day for an appt.    Brandt Loosen, MD 12/19/13 0425  Brandt Loosen, MD 12/19/13 [phone]

## 2013-12-19 NOTE — Telephone Encounter (Signed)
Follow Up  Pt is taking Atenolol-- Was seen in the ER and is requesting a same day appt for Palpitations.. Please call back to discuss

## 2013-12-19 NOTE — ED Notes (Signed)
Pt. reports palpitations with left shoulder pain , SOB and dizziness for several days , pt. also reported left lower abdominal pain with dysuria .

## 2013-12-19 NOTE — ED Notes (Addendum)
Pt states that she had an echo and Dr Clide Cliff called her last week to tell her the EF was 40-45%. Pt states that she has been getting dizzy twice a day for a week. Pt states that she also has been having pelvic pain on the left side and painful sexual intercourse. Pt also states that when she eats she feels pain in her lungs. States that she has had an EGD and has had to have injections. Pt states she has a hx of PVCs. C/o shoulder pain.

## 2013-12-19 NOTE — Telephone Encounter (Signed)
Pt c/o of palpitations not getting any better. Pt went to ED last night, they released her recommending f/u with our office. I scheduled pt to see Brooke in the morning for follow up (I explained to pt that Dr. Graciela Husbands was not in office until the end of the month, and we have no sooner appt with him). Pt did not have event monitor on at ED, advised pt to resume wearing monitor, pt verbalized understanding. Pt agreeable to plan.

## 2013-12-20 ENCOUNTER — Encounter: Payer: Self-pay | Admitting: Cardiology

## 2013-12-20 ENCOUNTER — Ambulatory Visit (INDEPENDENT_AMBULATORY_CARE_PROVIDER_SITE_OTHER): Payer: Medicaid Other | Admitting: Cardiology

## 2013-12-20 VITALS — BP 140/86 | HR 78 | Ht 69.0 in | Wt 193.1 lb

## 2013-12-20 DIAGNOSIS — I4949 Other premature depolarization: Secondary | ICD-10-CM

## 2013-12-20 DIAGNOSIS — R002 Palpitations: Secondary | ICD-10-CM

## 2013-12-20 DIAGNOSIS — I493 Ventricular premature depolarization: Secondary | ICD-10-CM

## 2013-12-20 MED ORDER — METOPROLOL TARTRATE 25 MG PO TABS: 25.0000 mg | ORAL_TABLET | Freq: Two times a day (BID) | ORAL | Status: DC

## 2013-12-20 NOTE — Patient Instructions (Signed)
Your physician has recommended you make the following change in your medication:   STOP YOUR ATENOLOL START METOPROLOL 25 MG TWICE A DAY (TWELVE HOURS APART)  KEEP APPOINTMENT WITH DR. Graciela Husbands

## 2013-12-25 ENCOUNTER — Inpatient Hospital Stay (HOSPITAL_COMMUNITY)
Admission: AD | Admit: 2013-12-25 | Discharge: 2013-12-26 | Disposition: A | Discharge status: Home / Self Care | Payer: Medicaid Other | Source: Ambulatory Visit | Attending: Obstetrics and Gynecology | Admitting: Obstetrics and Gynecology

## 2013-12-25 ENCOUNTER — Encounter (HOSPITAL_COMMUNITY): Payer: Self-pay | Admitting: *Deleted

## 2013-12-25 DIAGNOSIS — R300 Dysuria: Secondary | ICD-10-CM | POA: Insufficient documentation

## 2013-12-25 DIAGNOSIS — N898 Other specified noninflammatory disorders of vagina: Secondary | ICD-10-CM | POA: Insufficient documentation

## 2013-12-25 DIAGNOSIS — L293 Anogenital pruritus, unspecified: Secondary | ICD-10-CM | POA: Insufficient documentation

## 2013-12-25 DIAGNOSIS — R109 Unspecified abdominal pain: Secondary | ICD-10-CM | POA: Insufficient documentation

## 2013-12-25 DIAGNOSIS — N949 Unspecified condition associated with female genital organs and menstrual cycle: Secondary | ICD-10-CM | POA: Insufficient documentation

## 2013-12-25 NOTE — Progress Notes (Signed)
Patient ID: Raven [name] MRN: [id number], DOB/AGE: 33/02/1980   Date of Visit: 12/20/2013  Primary Physician: Karie Chimera, MD Primary Cardiologist: Berton Mount, MD  Reason for Visit: Palpitations   History of Present Illness  Raven [name] is a 32 y.o. female with history of symptomatic PVCs and anxiety who presents today for followup. She is currently undergoing work-up for recent increase in palpitations. She notes both skipping / flip flopping and racing. She is currently wearing an event monitor. CBC, BMET and TSH were within normal range. Urine pregnancy test negative. She had an echo which revealed LVEF 45%. She was restarted on atenolol and is scheduled for follow-up with Dr. Graciela Husbands in 2 weeks.   Today she reports increased anxiety and palpitations, not responding to atenolol. She describes both "racing and flip flopping" palpitations that occur with and without activity. She has also had occasional dizziness. She denies near syncope or syncope. She denies LE swelling, orthopnea or PND. She was taking atenolol previously but stopped one year ago on her own. She was evaluated in the ED last week for palpitations and scheduled for earlier follow-up today. She has had increased stressors recently and feels overwhelmed working full time as a single mother of 2. She denies recent illness, fever or chills. She denies any recent changes to her health other than the diagnosis of fibromyalgia. Her PCP is following her for chronic pain at multiple sites including back, arms, wrists, ankles and feet.  Past Medical History Past Medical History  Diagnosis Date  . Achalasia     esphog stretched several times  . Abnormal Pap smear   . Depression   . HSV-2 infection   . Heart palpitations     tx with atenolol by steven klein  . Heart murmur     as child - no prob as adult  . Anxiety   . Blood transfusion 01/2006    [facility]  . GERD (gastroesophageal reflux disease)     no meds - diet  controlled  . Asthma     rarely uses inhaler  . Arthritis   . History of abnormal Pap smear   . History of chlamydia 1990's  . Fibromyalgia     Past Surgical History Past Surgical History  Procedure Laterality Date  . Tonsillectomy    . Adenoidectomy  12/2002  . Esophagogastroduodenoscopy      with esophageal dilation x 3  . Dilation and curettage of uterus  1999  . Breast surgery  07/2001    tumor removed right breast  . Dilation and curettage of uterus  01/2006    retained products  . Bladder suspension  06/11/2012    Procedure: TRANSVAGINAL TAPE (TVT) PROCEDURE;  Surgeon: Purcell Nails, MD;  Location: [facility];  Service: Gynecology;  Laterality: N/A;  Tension Free Vaginal Tape/cystoscopy  . Laparoscopic tubal ligation  06/11/2012    Procedure: LAPAROSCOPIC TUBAL LIGATION;  Surgeon: Purcell Nails, MD;  Location: [facility];  Service: Gynecology;  Laterality: Bilateral;  Laparoscopic Bilateral Fulgeration  . Cystoscopy  06/11/2012    Procedure: CYSTOSCOPY;  Surgeon: Purcell Nails, MD;  Location: [facility];  Service: Gynecology;  Laterality: N/A;  . Foreign body removal  06/11/2012    Procedure: REMOVAL FOREIGN BODY EXTREMITY;  Surgeon: Purcell Nails, MD;  Location: [facility];  Service: Gynecology;  Laterality: Right;  Removal of Implanon     Allergies/Intolerances No Known Allergies  Current Home Medications Current Outpatient Prescriptions  Medication Sig Dispense  Refill  . amitriptyline (ELAVIL) 100 MG tablet Take 100 mg by mouth at bedtime.      . beclomethasone (QVAR) 80 MCG/ACT inhaler Inhale 2 puffs into the lungs every 6 (six) hours as needed (for wheezing and shortness of breath). For asthma      . clonazePAM (KLONOPIN) 1 MG tablet Take 1 mg by mouth at bedtime as needed. For anxiety      . EPINEPHrine (EPIPEN) 0.3 mg/0.3 mL SOAJ injection Inject 0.3 mg into the muscle once as needed (for allergies).      Marland Kitchen HYDROcodone-ibuprofen (VICOPROFEN) 7.5-200 MG per tablet Take 1  tablet by mouth every 8 (eight) hours as needed. For pain      . PRESCRIPTION MEDICATION Inject 2 Units into the muscle every 30 (thirty) days. Allergy shot.  One in each arm on the 11th of the month.      . metoprolol tartrate (LOPRESSOR) 25 MG tablet Take 1 tablet (25 mg total) by mouth 2 (two) times daily.  60 tablet  3   No current facility-administered medications for this visit.    Social History History   Social History  . Marital Status: Single    Spouse Name: N/A    Number of Children: N/A  . Years of Education: N/A   Occupational History  . student    Social History Main Topics  . Smoking status: Current Some Day Smoker -- 0.25 packs/day    Types: Cigarettes  . Smokeless tobacco: Never Used     Comment: HAS CUT BACK  . Alcohol Use: Yes     Comment: on occasion  . Drug Use: No  . Sexual Activity: Yes    Birth Control/ Protection: Surgical     Comment: IMPLANON   Other Topics Concern  . Not on file   Social History Narrative  . No narrative on file     Review of Systems General: No chills, fever, night sweats or weight changes Cardiovascular: No chest pain, dyspnea on exertion, edema, orthopnea, palpitations, paroxysmal nocturnal dyspnea Dermatological: No rash, lesions or masses Respiratory: No cough, dyspnea Urologic: No hematuria, dysuria Abdominal: No nausea, vomiting, diarrhea, bright red blood per rectum, melena, or hematemesis Neurologic: No visual changes, weakness, changes in mental status All other systems reviewed and are otherwise negative except as noted above.  Physical Exam Vitals: Blood pressure 140/86, pulse 78, height 5\' 9"  (1.753 m), weight 193 lb 1.9 oz (87.599 kg), last menstrual period 11/22/2013.  General: Well developed, well appearing 32 y.o. female in no acute distress. HEENT: Normocephalic, atraumatic. EOMs intact. Sclera nonicteric. Oropharynx clear.  Neck: Supple. No JVD. Lungs: Respirations regular and unlabored, CTA  bilaterally. No wheezes, rales or rhonchi. Heart: RRR. S1, S2 present. No murmurs, rub, S3 or S4. Abdomen: Soft, non-distended.  Extremities: No clubbing, cyanosis or edema. PT/Radials 2+ and equal bilaterally. Psych: Normal affect. Neuro: Alert and oriented X 3. Moves all extremities spontaneously.   Diagnostics Echocardiogram  Study Conclusions - Left ventricle: The cavity size was normal. Wall thickness was normal. Systolic function was mildly to moderately reduced. The estimated ejection fraction was in the range of 40% to 45%. Hypokinesis of the anterolateral, anterior, and anteroseptal walls. Left ventricular diastolic function parameters were normal. - Aortic valve: There was no stenosis. - Mitral valve: No significant regurgitation. - Right ventricle: The cavity size was normal. Systolic function was normal. - Tricuspid valve: Peak RV-RA gradient: 17mm Hg (S). - Pulmonary arteries: PA peak pressure: 22mm Hg (S). - Inferior  vena cava: The vessel was normal in size; the respirophasic diameter changes were in the normal range (= 50%); findings are consistent with normal central venous pressure. Impression: - Normal LV size and systolic function. EF 40-45% with anterior, anteroseptal, and anterolateral hypokinesis. Interestingly, LV diastolic function appears normal. Normal RV size and systolic function. No significant valvular abnormalities. 12-lead ECG with rhythm today - NSR with normal intervals; no ST-T wave abnormalities  Assessment and Plan 1. Palpitations  - known h/o symptomatic PVCs and was previously taking atenolol; however, she describes new "racing" palpitations as well - she will continue to wear 30-day event monitor - reviewed echo results   - will stop atenolol in favor of metoprolol - return for follow-up with Dr. Graciela Husbands as scheduled in 2 weeks  Signed, Rick Duff, PA-C 12/25/2013, 8:55 AM

## 2013-12-25 NOTE — MAU Note (Signed)
Pt reports she was seen in ER last week with abd pain. States she told them she was having vaginal discharge and itching but they did not do anything. States it hurts when she has sex, and she is having burning with urination.

## 2013-12-26 ENCOUNTER — Inpatient Hospital Stay (HOSPITAL_COMMUNITY): Payer: Medicaid Other

## 2013-12-26 LAB — URINALYSIS, ROUTINE W REFLEX MICROSCOPIC
Bilirubin Urine: NEGATIVE
Ketones, ur: NEGATIVE mg/dL
Nitrite: NEGATIVE
Urobilinogen, UA: 0.2 mg/dL (ref 0.0–1.0)

## 2013-12-26 LAB — GC/CHLAMYDIA PROBE AMP
CT Probe RNA: NEGATIVE
GC Probe RNA: NEGATIVE

## 2013-12-26 LAB — WET PREP, GENITAL: Trich, Wet Prep: NONE SEEN

## 2013-12-26 MED ORDER — FLUCONAZOLE 150 MG PO TABS: 150.0000 mg | ORAL_TABLET | Freq: Once | ORAL | Status: DC

## 2013-12-26 MED ORDER — DOXYCYCLINE HYCLATE 50 MG PO CAPS: 100.0000 mg | ORAL_CAPSULE | Freq: Two times a day (BID) | ORAL | Status: AC

## 2013-12-26 MED ORDER — CEFTRIAXONE SODIUM 250 MG IJ SOLR
250.0000 mg | Freq: Once | INTRAMUSCULAR | Status: AC
  Administered 2013-12-26: 250 mg via INTRAMUSCULAR
  Filled 2013-12-26: qty 250

## 2013-12-26 NOTE — MAU Note (Signed)
J. Oxley CNM notified of pt. 

## 2014-01-09 ENCOUNTER — Ambulatory Visit (INDEPENDENT_AMBULATORY_CARE_PROVIDER_SITE_OTHER): Payer: Medicaid Other | Admitting: Internal Medicine

## 2014-01-09 ENCOUNTER — Encounter: Payer: Self-pay | Admitting: Internal Medicine

## 2014-01-09 VITALS — BP 138/86 | HR 81 | Ht 69.0 in | Wt 190.4 lb

## 2014-01-09 DIAGNOSIS — I428 Other cardiomyopathies: Secondary | ICD-10-CM | POA: Insufficient documentation

## 2014-01-09 DIAGNOSIS — I429 Cardiomyopathy, unspecified: Secondary | ICD-10-CM | POA: Insufficient documentation

## 2014-01-09 DIAGNOSIS — I493 Ventricular premature depolarization: Secondary | ICD-10-CM

## 2014-01-09 DIAGNOSIS — I4949 Other premature depolarization: Secondary | ICD-10-CM

## 2014-01-09 DIAGNOSIS — R002 Palpitations: Secondary | ICD-10-CM

## 2014-01-09 HISTORY — DX: Other cardiomyopathies: I42.8

## 2014-01-09 HISTORY — DX: Cardiomyopathy, unspecified: I42.9

## 2014-01-09 MED ORDER — LISINOPRIL 2.5 MG PO TABS: 2.5000 mg | ORAL_TABLET | Freq: Every day | ORAL | Status: DC

## 2014-01-09 MED ORDER — CARVEDILOL 6.25 MG PO TABS: 6.2500 mg | ORAL_TABLET | Freq: Two times a day (BID) | ORAL | Status: DC

## 2014-01-09 MED ORDER — METOPROLOL SUCCINATE ER 25 MG PO TB24: 25.0000 mg | ORAL_TABLET | Freq: Every day | ORAL | Status: DC

## 2014-01-09 NOTE — Assessment & Plan Note (Signed)
We will undertake a Myoview scan to exclude evidence of coronary disease as the cause of her mild cardiomyopathy. We will begin her on low-dose ACE inhibitor therapy and check a metabolic profile in about 2 weeks time and change her beta blockers to study based beta blockers

## 2014-01-09 NOTE — Assessment & Plan Note (Signed)
Symptomatic PVCs although in her frequency there likely responsible for her mild LV dysfunction. Her blood pressures also not sufficiently elevated to explain that.  We will change her beta blocker and give her a prescription for carvedilol 6.25 to metoprolol succinate 25 and assess effectiveness. We will look into into insurance coverage back [employer]

## 2014-01-09 NOTE — Patient Instructions (Signed)
Your physician has recommended you make the following change in your medication:  1) Stop Metoprolol Tartrate 2) Start Coreg 6.25 mg twice daily for one month, if no improvement then stop coreg and start metoprolol succinate 3) Start Metoprolol Succinate 25 mg daily if coreg does not help improve symptoms 4) Start Lisonipril 2.5 mg daily  Your physician has requested that you have a lexiscan myoview. For further information please visit [email or web address]. Please follow instruction sheet, as given.  Your physician recommends that you return for lab work in: 2 weeks for BMET  Your physician recommends that you schedule a follow-up appointment in: 4 months with Dr. Caryl Comes.

## 2014-01-09 NOTE — Progress Notes (Signed)
Patient Care Team: Kristine Garbe, MD as PCP - General (Family Medicine)   HPI  Raven [name] is a 33 y.o. female Seen in followup for PVCs and sinus tach assoc with palps and anxiety;   EF 45% by echo 12/14   Atenolol changed to metoprolol without significant improvement.  30 day recorder>> PVCs albethey  very infrequent   Denies exercise intolerance.    Past Medical History  Diagnosis Date  . Achalasia     esphog stretched several times  . Abnormal Pap smear   . Depression   . HSV-2 infection   . Heart palpitations     tx with atenolol by Keaira Whitehurst  . Heart murmur     as child - no prob as adult  . Anxiety   . Blood transfusion 01/2006    [place]  . GERD (gastroesophageal reflux disease)     no meds - diet controlled  . Asthma     rarely uses inhaler  . Arthritis   . History of abnormal Pap smear   . History of chlamydia 1990's  . Fibromyalgia     Past Surgical History  Procedure Laterality Date  . Tonsillectomy    . Adenoidectomy  12/2002  . Esophagogastroduodenoscopy      with esophageal dilation x 3  . Dilation and curettage of uterus  1999  . Breast surgery  07/2001    tumor removed right breast  . Dilation and curettage of uterus  01/2006    retained products  . Bladder suspension  06/11/2012    Procedure: TRANSVAGINAL TAPE (TVT) PROCEDURE;  Surgeon: Delice Lesch, MD;  Location: [facility];  Service: Gynecology;  Laterality: N/A;  Tension Free Vaginal Tape/cystoscopy  . Laparoscopic tubal ligation  06/11/2012    Procedure: LAPAROSCOPIC TUBAL LIGATION;  Surgeon: Delice Lesch, MD;  Location: [facility];  Service: Gynecology;  Laterality: Bilateral;  Laparoscopic Bilateral Fulgeration  . Cystoscopy  06/11/2012    Procedure: CYSTOSCOPY;  Surgeon: Delice Lesch, MD;  Location: [facility];  Service: Gynecology;  Laterality: N/A;  . Foreign body removal  06/11/2012    Procedure: REMOVAL FOREIGN BODY EXTREMITY;  Surgeon: Delice Lesch, MD;   Location: [facility];  Service: Gynecology;  Laterality: Right;  Removal of Implanon     Current Outpatient Prescriptions  Medication Sig Dispense Refill  . amitriptyline (ELAVIL) 100 MG tablet Take 100 mg by mouth at bedtime.      . beclomethasone (QVAR) 80 MCG/ACT inhaler Inhale 2 puffs into the lungs every 6 (six) hours as needed (for wheezing and shortness of breath). For asthma      . clonazePAM (KLONOPIN) 1 MG tablet Take 1 mg by mouth at bedtime as needed. For anxiety      . doxycycline (VIBRAMYCIN) 50 MG capsule Take 2 capsules (100 mg total) by mouth 2 (two) times daily.  28 capsule  0  . EPINEPHrine (EPIPEN) 0.3 mg/0.3 mL SOAJ injection Inject 0.3 mg into the muscle once as needed (for allergies).      . fluconazole (DIFLUCAN) 150 MG tablet Take 1 tablet (150 mg total) by mouth once.  1 tablet  0  . HYDROcodone-ibuprofen (VICOPROFEN) 7.5-200 MG per tablet Take 1 tablet by mouth every 8 (eight) hours as needed. For pain      . PRESCRIPTION MEDICATION Inject 2 Units into the muscle every 30 (thirty) days. Allergy shot.  One in each arm on the 11th of the month.  No current facility-administered medications for this visit.    No Known Allergies  Review of Systems negative except from HPI and PMH  Physical Exam BP 138/86  Pulse 81  Ht 5\' 9"  (1.753 m)  Wt 190 lb 6.4 oz (86.365 kg)  BMI 28.10 kg/m2  LMP 12/07/2013 Well developed and nourished in no acute distress HENT normal Neck supple with JVP-flat Clear Regular rate and rhythm, no murmurs or gallops Abd-soft with active BS No Clubbing cyanosis edema Skin-warm and dry A & Oriented  Grossly normal sensory and motor function  Event recorder demonstrated PVCs associated with each of her complaints of palpitations although the eCardio report printed of sinus rhythm on many of them   Assessment and  Plan

## 2014-01-10 ENCOUNTER — Telehealth: Payer: Self-pay | Admitting: Internal Medicine

## 2014-01-10 ENCOUNTER — Other Ambulatory Visit: Payer: Self-pay | Admitting: *Deleted

## 2014-01-10 NOTE — Telephone Encounter (Signed)
Pt boyfriend answered phone, but stated that pt not with him. He stated that he will have her call me back.   (We can change her medication to liquid form)

## 2014-01-10 NOTE — Telephone Encounter (Signed)
New message  Patient is unable to swallow a pill. She has to take liquid or be able to crush it. She was given Metoprolol and is unable to crush it up. Please call and advise.

## 2014-01-17 ENCOUNTER — Ambulatory Visit (HOSPITAL_COMMUNITY): Payer: Medicaid Other | Attending: Internal Medicine | Admitting: Radiology

## 2014-01-17 VITALS — BP 119/71 | HR 68 | Ht 69.0 in | Wt 189.0 lb

## 2014-01-17 DIAGNOSIS — R42 Dizziness and giddiness: Secondary | ICD-10-CM | POA: Insufficient documentation

## 2014-01-17 DIAGNOSIS — I10 Essential (primary) hypertension: Secondary | ICD-10-CM | POA: Insufficient documentation

## 2014-01-17 DIAGNOSIS — F17200 Nicotine dependence, unspecified, uncomplicated: Secondary | ICD-10-CM | POA: Insufficient documentation

## 2014-01-17 DIAGNOSIS — R0609 Other forms of dyspnea: Secondary | ICD-10-CM | POA: Insufficient documentation

## 2014-01-17 DIAGNOSIS — R002 Palpitations: Secondary | ICD-10-CM | POA: Insufficient documentation

## 2014-01-17 DIAGNOSIS — R0602 Shortness of breath: Secondary | ICD-10-CM | POA: Insufficient documentation

## 2014-01-17 DIAGNOSIS — Z8249 Family history of ischemic heart disease and other diseases of the circulatory system: Secondary | ICD-10-CM | POA: Insufficient documentation

## 2014-01-17 DIAGNOSIS — R0989 Other specified symptoms and signs involving the circulatory and respiratory systems: Secondary | ICD-10-CM | POA: Insufficient documentation

## 2014-01-17 DIAGNOSIS — I493 Ventricular premature depolarization: Secondary | ICD-10-CM

## 2014-01-17 DIAGNOSIS — J45909 Unspecified asthma, uncomplicated: Secondary | ICD-10-CM | POA: Insufficient documentation

## 2014-01-17 DIAGNOSIS — R079 Chest pain, unspecified: Secondary | ICD-10-CM | POA: Insufficient documentation

## 2014-01-17 MED ORDER — TECHNETIUM TC 99M SESTAMIBI GENERIC - CARDIOLITE: 30.0000 | Freq: Once | INTRAVENOUS | Status: DC | PRN

## 2014-01-17 MED ORDER — TECHNETIUM TC 99M SESTAMIBI GENERIC - CARDIOLITE
30.0000 | Freq: Once | INTRAVENOUS | Status: AC | PRN
  Administered 2014-01-17: 30 via INTRAVENOUS

## 2014-01-17 MED ORDER — REGADENOSON 0.4 MG/5ML IV SOLN
0.4000 mg | Freq: Once | INTRAVENOUS | Status: AC
  Administered 2014-01-17: 0.4 mg via INTRAVENOUS

## 2014-01-17 MED ORDER — TECHNETIUM TC 99M SESTAMIBI GENERIC - CARDIOLITE: 10.0000 | Freq: Once | INTRAVENOUS | Status: DC | PRN

## 2014-01-17 MED ORDER — TECHNETIUM TC 99M SESTAMIBI GENERIC - CARDIOLITE
10.0000 | Freq: Once | INTRAVENOUS | Status: AC | PRN
  Administered 2014-01-17: 10 via INTRAVENOUS

## 2014-01-17 MED ORDER — REGADENOSON 0.4 MG/5ML IV SOLN: 0.4000 mg | Freq: Once | INTRAVENOUS | Status: DC

## 2014-01-17 NOTE — Progress Notes (Signed)
[facility] NUCLEAR MED [address] [place], [place] [address] [phone]    Cardiology Nuclear Med Study  Raven [name] is a 33 y.o. female     MRN : [id number]     DOB: 08/01/1980  Procedure Date: 01/17/2014  Nuclear Med Background Indication for Stress Test:  Evaluation for Ischemia History:  No known CAD, Echo 2014 EF 45%, Asthma, Hx esoph. dilation Cardiac Risk Factors: Family History - CAD, Hypertension and Smoker  Symptoms:  Chest Pain, Chest Pain with Exertion, Dizziness, DOE, Palpitations and SOB   Nuclear Pre-Procedure Caffeine/Decaff Intake:  None > 12 hrs NPO After: 11:00pm   Lungs:  clear O2 Sat: 98% on room air. IV 0.9% NS with Angio Cath:  22g  IV Site: R Antecubital x 1, tolerated well IV Started by:  Irven Baltimore, RN  Chest Size (in):  34 Cup Size: C  Height: 5\' 9"  (1.753 m)  Weight:  189 lb (85.73 kg)  BMI:  Body mass index is 27.9 kg/(m^2). Tech Comments: Patient has not filled the Coreg or Toprol yet, will pick up today.    Nuclear Med Study 1 or 2 day study: 1 day  Stress Test Type:  Treadmill/Lexiscan  Reading MD: N/A  Order Authorizing Provider:  Virl Axe, MD  Resting Radionuclide: Technetium 50m Sestamibi  Resting Radionuclide Dose: 11.0 mCi   Stress Radionuclide:  Technetium 42m Sestamibi  Stress Radionuclide Dose: 33.0 mCi           Stress Protocol Rest HR: 68 Stress HR: 115  Rest BP: 119/71 Stress BP: 137/88  Exercise Time (min): n/a METS: n/a           Dose of Adenosine (mg):  n/a Dose of Lexiscan: 0.4 mg  Dose of Atropine (mg): n/a Dose of Dobutamine: n/a mcg/kg/min (at max HR)  Stress Test Technologist: Glade Lloyd, BS-ES  Nuclear Technologist:  Charlton Amor, CNMT     Rest Procedure:  Myocardial perfusion imaging was performed at rest 45 minutes following the intravenous administration of Technetium 68m Sestamibi. Rest ECG: NSR - Normal EKG  Stress Procedure:  The patient received IV Lexiscan  0.4 mg over 15-seconds with concurrent low level exercise and then Technetium 71m Sestamibi was injected at 30-seconds while the patient continued walking one more minute.  Quantitative spect images were obtained after a 45-minute delay.  During the infusion of Lexiscan, the patient complained of a warm feeling.  This resolved in recovery.  Stress ECG: No significant change from baseline ECG  QPS Raw Data Images:  Normal; no motion artifact; normal heart/lung ratio. Stress Images:  Normal homogeneous uptake in all areas of the myocardium. Rest Images:  Normal homogeneous uptake in all areas of the myocardium. Subtraction (SDS):  No evidence of ischemia. Transient Ischemic Dilatation (Normal <1.22):  1.05 Lung/Heart Ratio (Normal <0.45):  0.30  Quantitative Gated Spect Images QGS EDV:  159 ml QGS ESV:  99 ml  Impression Exercise Capacity:  Lexiscan with low level exercise. BP Response:  Normal blood pressure response. Clinical Symptoms:  No significant symptoms noted. ECG Impression:  No significant ST segment change suggestive of ischemia. Comparison with Prior Nuclear Study: No previous nuclear study performed  Overall Impression:  Low risk stress nuclear study with findings of dilated left ventricle and no ischemia consistent with non-ischemic dilated cardiomyopathy. .  LV Ejection Fraction: 38%.  LV Wall Motion:  Diffuse hypokinesis.   Ena Dawley, H 01/17/2014

## 2014-01-18 ENCOUNTER — Other Ambulatory Visit: Payer: Self-pay | Admitting: *Deleted

## 2014-01-18 NOTE — Telephone Encounter (Signed)
Advised pt we could switch her to liquid. Pt states that she has started the Coreg, because she can crush it.  If she needs to switch to Metoprolol in a month, then she will call office back to request liquid. Pt agreeable to plan.

## 2014-01-22 ENCOUNTER — Emergency Department (HOSPITAL_COMMUNITY): Payer: Medicaid Other

## 2014-01-22 ENCOUNTER — Encounter (HOSPITAL_COMMUNITY): Payer: Self-pay | Admitting: Emergency Medicine

## 2014-01-22 ENCOUNTER — Emergency Department (HOSPITAL_COMMUNITY)
Admission: EM | Admit: 2014-01-22 | Discharge: 2014-01-22 | Disposition: A | Discharge status: Home / Self Care | Payer: Medicaid Other | Attending: Emergency Medicine | Admitting: Emergency Medicine

## 2014-01-22 DIAGNOSIS — M129 Arthropathy, unspecified: Secondary | ICD-10-CM | POA: Insufficient documentation

## 2014-01-22 DIAGNOSIS — F3289 Other specified depressive episodes: Secondary | ICD-10-CM | POA: Insufficient documentation

## 2014-01-22 DIAGNOSIS — F17200 Nicotine dependence, unspecified, uncomplicated: Secondary | ICD-10-CM | POA: Insufficient documentation

## 2014-01-22 DIAGNOSIS — F329 Major depressive disorder, single episode, unspecified: Secondary | ICD-10-CM | POA: Insufficient documentation

## 2014-01-22 DIAGNOSIS — Z792 Long term (current) use of antibiotics: Secondary | ICD-10-CM | POA: Insufficient documentation

## 2014-01-22 DIAGNOSIS — Z8679 Personal history of other diseases of the circulatory system: Secondary | ICD-10-CM | POA: Insufficient documentation

## 2014-01-22 DIAGNOSIS — Z9851 Tubal ligation status: Secondary | ICD-10-CM | POA: Insufficient documentation

## 2014-01-22 DIAGNOSIS — K5289 Other specified noninfective gastroenteritis and colitis: Secondary | ICD-10-CM | POA: Insufficient documentation

## 2014-01-22 DIAGNOSIS — Z3202 Encounter for pregnancy test, result negative: Secondary | ICD-10-CM | POA: Insufficient documentation

## 2014-01-22 DIAGNOSIS — Z79899 Other long term (current) drug therapy: Secondary | ICD-10-CM | POA: Insufficient documentation

## 2014-01-22 DIAGNOSIS — R350 Frequency of micturition: Secondary | ICD-10-CM | POA: Insufficient documentation

## 2014-01-22 DIAGNOSIS — Z8619 Personal history of other infectious and parasitic diseases: Secondary | ICD-10-CM | POA: Insufficient documentation

## 2014-01-22 DIAGNOSIS — J45909 Unspecified asthma, uncomplicated: Secondary | ICD-10-CM | POA: Insufficient documentation

## 2014-01-22 DIAGNOSIS — F411 Generalized anxiety disorder: Secondary | ICD-10-CM | POA: Insufficient documentation

## 2014-01-22 DIAGNOSIS — Z9089 Acquired absence of other organs: Secondary | ICD-10-CM | POA: Insufficient documentation

## 2014-01-22 DIAGNOSIS — K529 Noninfective gastroenteritis and colitis, unspecified: Secondary | ICD-10-CM

## 2014-01-22 DIAGNOSIS — Z8719 Personal history of other diseases of the digestive system: Secondary | ICD-10-CM | POA: Insufficient documentation

## 2014-01-22 LAB — CBC WITH DIFFERENTIAL/PLATELET
BASOS ABS: 0 10*3/uL (ref 0.0–0.1)
Basophils Relative: 0 % (ref 0–1)
EOS ABS: 0.1 10*3/uL (ref 0.0–0.7)
EOS PCT: 1 % (ref 0–5)
HEMATOCRIT: 40.5 % (ref 36.0–46.0)
Hemoglobin: 14.5 g/dL (ref 12.0–15.0)
LYMPHS ABS: 3.6 10*3/uL (ref 0.7–4.0)
LYMPHS PCT: 23 % (ref 12–46)
MCH: 32.2 pg (ref 26.0–34.0)
MCHC: 35.8 g/dL (ref 30.0–36.0)
MCV: 90 fL (ref 78.0–100.0)
Monocytes Absolute: 1.1 10*3/uL — ABNORMAL HIGH (ref 0.1–1.0)
Monocytes Relative: 7 % (ref 3–12)
Neutro Abs: 10.6 10*3/uL — ABNORMAL HIGH (ref 1.7–7.7)
Neutrophils Relative %: 69 % (ref 43–77)
Platelets: 235 10*3/uL (ref 150–400)
RBC: 4.5 MIL/uL (ref 3.87–5.11)
RDW: 12.5 % (ref 11.5–15.5)
WBC: 15.4 10*3/uL — ABNORMAL HIGH (ref 4.0–10.5)

## 2014-01-22 LAB — COMPREHENSIVE METABOLIC PANEL
ALT: 15 U/L (ref 0–35)
AST: 19 U/L (ref 0–37)
Albumin: 4.3 g/dL (ref 3.5–5.2)
Alkaline Phosphatase: 69 U/L (ref 39–117)
BUN: 8 mg/dL (ref 6–23)
CO2: 23 meq/L (ref 19–32)
CREATININE: 0.76 mg/dL (ref 0.50–1.10)
Calcium: 9.7 mg/dL (ref 8.4–10.5)
Chloride: 100 mEq/L (ref 96–112)
GFR calc Af Amer: >90 mL/min (ref >90)
Glucose, Bld: 103 mg/dL — ABNORMAL HIGH (ref 70–99)
Potassium: 4.2 mEq/L (ref 3.7–5.3)
Sodium: 137 mEq/L (ref 137–147)
TOTAL PROTEIN: 8.1 g/dL (ref 6.0–8.3)
Total Bilirubin: 0.6 mg/dL (ref 0.3–1.2)

## 2014-01-22 LAB — URINALYSIS, ROUTINE W REFLEX MICROSCOPIC
BILIRUBIN URINE: NEGATIVE
Glucose, UA: NEGATIVE mg/dL
HGB URINE DIPSTICK: NEGATIVE
Ketones, ur: NEGATIVE mg/dL
Leukocytes, UA: NEGATIVE
Nitrite: NEGATIVE
PH: 5 (ref 5.0–8.0)
Protein, ur: NEGATIVE mg/dL
Specific Gravity, Urine: 1.012 (ref 1.005–1.030)
UROBILINOGEN UA: 0.2 mg/dL (ref 0.0–1.0)

## 2014-01-22 LAB — POCT I-STAT TROPONIN I: TROPONIN I, POC: 0 ng/mL (ref 0.00–0.08)

## 2014-01-22 LAB — POCT PREGNANCY, URINE: Preg Test, Ur: NEGATIVE

## 2014-01-22 LAB — LIPASE, BLOOD: Lipase: 28 U/L (ref 11–59)

## 2014-01-22 MED ORDER — CIPROFLOXACIN IN D5W 400 MG/200ML IV SOLN
400.0000 mg | Freq: Once | INTRAVENOUS | Status: DC
  Filled 2014-01-22: qty 200

## 2014-01-22 MED ORDER — CIPROFLOXACIN HCL 500 MG PO TABS: 500.0000 mg | ORAL_TABLET | Freq: Two times a day (BID) | ORAL | Status: DC

## 2014-01-22 MED ORDER — METRONIDAZOLE 500 MG PO TABS
500.0000 mg | ORAL_TABLET | Freq: Once | ORAL | Status: AC
  Administered 2014-01-22: 500 mg via ORAL
  Filled 2014-01-22: qty 1

## 2014-01-22 MED ORDER — CIPROFLOXACIN HCL 500 MG PO TABS
500.0000 mg | ORAL_TABLET | Freq: Once | ORAL | Status: AC
  Administered 2014-01-22: 500 mg via ORAL
  Filled 2014-01-22: qty 1

## 2014-01-22 MED ORDER — ONDANSETRON 4 MG PO TBDP: ORAL_TABLET | ORAL | Status: DC

## 2014-01-22 MED ORDER — HYDROCODONE-ACETAMINOPHEN 5-325 MG PO TABS: 1.0000 | ORAL_TABLET | Freq: Four times a day (QID) | ORAL | Status: DC | PRN

## 2014-01-22 MED ORDER — METRONIDAZOLE 500 MG PO TABS: 500.0000 mg | ORAL_TABLET | Freq: Three times a day (TID) | ORAL | Status: DC

## 2014-01-22 MED ORDER — METRONIDAZOLE IN NACL 5-0.79 MG/ML-% IV SOLN
500.0000 mg | Freq: Once | INTRAVENOUS | Status: DC
  Filled 2014-01-22: qty 100

## 2014-01-22 MED ORDER — MORPHINE SULFATE 4 MG/ML IJ SOLN
4.0000 mg | Freq: Once | INTRAMUSCULAR | Status: AC
  Administered 2014-01-22: 4 mg via INTRAVENOUS
  Filled 2014-01-22: qty 1

## 2014-01-22 NOTE — Discharge Instructions (Signed)
Follow up with your doctor in 2 days for reevaluation and to ensure symptom improvement. Take ALL of your antibiotics as prescribed. Take pain and nausea medication as directed. Do not drive while taking pain medication. Avoid tylenol containing medications while taking prescription pain medication. Return to ED should you develop worsening pain, fever/chills, or develop new symptoms such as bloody stools.    Emergency Department Resource Guide 1) Find a Doctor and Pay Out of Pocket Although you won't have to find out who is covered by your insurance plan, it is a good idea to ask around and get recommendations. You will then need to call the office and see if the doctor you have chosen will accept you as a new patient and what types of options they offer for patients who are self-pay. Some doctors offer discounts or will set up payment plans for their patients who do not have insurance, but you will need to ask so you aren't surprised when you get to your appointment.  2) Contact Your Local Health Department Not all health departments have doctors that can see patients for sick visits, but many do, so it is worth a call to see if yours does. If you don't know where your local health department is, you can check in your phone book. The CDC also has a tool to help you locate your state's health department, and many state websites also have listings of all of their local health departments.  3) Find a [facility] If your illness is not likely to be very severe or complicated, you may want to try a walk in clinic. These are popping up all over the country in pharmacies, drugstores, and shopping centers. They're usually staffed by nurse practitioners or physician assistants that have been trained to treat common illnesses and complaints. They're usually fairly quick and inexpensive. However, if you have serious medical issues or chronic medical problems, these are probably not your best option.  No  Primary Care Doctor: - Call Health Connect at  [phone] - they can help you locate a primary care doctor that  accepts your insurance, provides certain services, etc. - Physician Referral Service- [phone]  Chronic Pain Problems: Organization         Address  Phone   Notes  [facility]  [phone] Patients need to be referred by their primary care doctor.   Medication Assistance: Organization         Address  Phone   Notes  [facility] Medication [facility] [place] [place], [place] [address] [phone] --Must be a resident of [facility] -- Must have NO insurance coverage whatsoever (no Medicaid/ Medicare, etc.) -- The pt. MUST have a primary care doctor that directs their care regularly and follows them in the community   MedAssist  [phone]   [employer]  [phone]    Agencies that provide inexpensive medical care: Organization         Address  Phone   Notes  [place]  [phone]   [facility]    [phone]   [facility] [place], [place] [address] [phone]   [place] [address] [place] [phone]   Planned Parenthood    [phone]   [facility]    [phone]   [place] and [place], [place]  Phone:  [phone], Fax:  [phone] Hours of Operation:  9 am - 6 pm, M-F.  Also accepts Medicaid/Medicare and self-pay.  [facility] for [place] [place], [address], [place] Phone: [phone], Fax: [phone]. Hours of Operation:  8:30 am - 5:30 pm, M-F.  Also accepts Medicaid and self-pay.  [facility] [address] [place] Phone: [phone]   [place], [place], [place] [phone], Ext. [phone] Mondays & Thursdays: 7-9 AM.  First 15 patients are seen on  a first come, first serve basis.    [place] Providers:  Organization         Address  Phone   Notes  [facility] [address], [place] [phone] Also accepts self-pay patients.  [facility] [address], [place]  [phone]   [place], [address], [place] [phone]   [facility] [address], [place] [phone]   Lucianne Lei [address], [place]   [phone] Only accepts [place] Access [place] patients after they have their name applied to their card.   Self-Pay (no insurance) in [facility]:  Organization         Address  Phone   Notes  Sickle Cell Patients, [facility] [place] [phone]   [facility] [place] [phone]   [facility] [place]  [place], [place], [place] [phone]   [facility]/Dr. Osei-Bonsu  [address], [place] or [address], [place] [phone] Phone number for both [place] and [place] locations is the same.  [facility] [address] [place] [phone]   [facility] [address], [place] or [address] [phone]   [facility] [address] [place] [phone], phone; [phone], fax Sees patients 1st and 3rd Saturday of every month.  Must not qualify for public or private insurance (i.e. Medicaid, Medicare, [place] Health Choice, Veterans' Benefits)  Household income should be no more than 200% of the poverty level The clinic cannot treat you if you are pregnant or think you are pregnant  Sexually transmitted diseases are not treated at the clinic.    Dental Care: Organization          Address  Phone  Notes  [facility] Department of [facility] [place] [phone] Accepts children up to age 69 who are enrolled in [place] or [place]; pregnant women with a Medicaid card; and children who have applied for Medicaid or [place] Health Choice, but were declined, whose parents can pay a reduced fee at time of service.  [facility] Department of [facility]  [address], [place] [phone] Accepts children up to age 76 who are enrolled in [place] or [place]; pregnant women with a Medicaid card; and children who have applied for Medicaid or [place] Health Choice, but were declined, whose parents can pay a reduced fee at time of service.  [place] Adult [facility] Access PROGRAM  [place] [phone] Patients are seen by appointment  only. Walk-ins are not accepted. [place] will see patients 36 years of age and older. Monday - Tuesday (8am-5pm) Most Wednesdays (8:30-5pm) $30 per visit, cash only  [facility] Adult Dental Access PROGRAM  [address], [facility] [phone] Patients are seen by appointment only. Walk-ins are not accepted. [place] will see patients 67 years of age and older. One Wednesday Evening (Monthly: Volunteer Based).  $30 per visit, cash only  [place]  [phone] for adults; Children under age 36, call Graduate Pediatric Dentistry at [phone]. Children aged 50-14, please call [phone] to request a pediatric application.  Dental services are provided in all areas of dental care including fillings, crowns and bridges, complete and partial dentures, implants, gum treatment, root canals, and extractions. Preventive care is also provided. Treatment is provided to both adults and children. Patients are selected via a lottery and there is often a waiting list.   [facility] [address] [place]  [phone] [email or web address]   [facility] [address] [place], [place] [phone], Ext. [phone] Second and Fourth Thursday of each month, opens at 6:30 AM; Clinic ends at 9 AM.  Patients are seen on a first-come first-served basis, and a limited number are seen during each clinic.   [facility]  [address] Hillard Danker [place], [place] [phone]   Eligibility Requirements You must have lived in [place], [place], or [place] counties for at least the last three months.   You cannot be eligible for state or federal sponsored [employer], including [employer], [place], or [employer].   You generally cannot be eligible for healthcare insurance through your employer.    How to apply: Eligibility screenings are held every Tuesday and Wednesday afternoon from 1:00 pm until 4:00 pm. You do not need an appointment for the interview!  [facility] [address], [place], [place]   [place]  [place] Department  [place]  [phone]    Behavioral Health Resources in the Community: Intensive Outpatient Programs Organization         Address  Phone  Notes  [place]. [address] [place], [place] [phone]   [facility] [address], [place], [place]   ADS: Alcohol & Drug Svcs [address] [place], [place]   [place] [address],  [place], [place] or [phone]   Substance Abuse Resources Organization         Address  Phone  Notes  Alcohol and Drug Services  [phone]   [place]  [phone]   The [place]   Chinita Pester  [phone]   Residential & Outpatient Substance Abuse Program  [phone]   Psychological  Services Organization         Address  Phone  Notes  [facility] [place]  [place]  [phone]   [place] [address] [place] [phone] or [phone]    Mobile Crisis Teams Organization         Address  Phone  Notes  [facility], Mobile Crisis Care Unit  [phone]   [facility]ive Psychotherapeutic Services  [address] [place], [place]   [facility] [address], [place] [phone]  Self-Help/Support Groups Organization         Address  Phone             Notes  [facility]ental [facility]oc[facility] of [place] - variety of support groups  [place] Call for more information  Narcotics Anonymous (NA), [facility] [address], [employer] [place]  2 meetings at this location   Special educational needs teacher         Address  Phone  Notes  ASAP Residential Treatment [place],    [place]  [phone]   [facility]  [address], [place] [phone], [place], [place]   [place], [place] [phone] Admissions: 8am-3pm M-F  Incentives Substance [place] [address],    [place], [place] [phone]   [facility] [address] [place], [place], [place]   [facility] [address]  [place], [place]   [facility] Programs - Intensive Outpatient [address], [place], [place]   [facility] [place]  [place], [place] [phone] or [phone]   [facility] (RTS) [address], [place], [place] Accepts Medicaid  [facility] [place] [address],  [place] [phone] Substance Abuse/Addiction Treatment   [facility] Organization         Address  Phone  Notes  [facility]  [phone]   Domenic Schwab, PhD [address] Arlis [place], [place]   [phone] or [phone]   [place], [place] [phone]   [facility] [address], [place], [place] [phone] Insurance/Medicaid/sponsorship through [facility] and Families [address]                                    [place], [place] [phone] [place] [address], [place] [phone]    Dr. Adele Schilder  [phone]   [facility] Dept. 1) [address], [place] 2) [place] 3)  [place] [address], [place] [phone]  [phone]   [place] [phone] or [phone] (After Hours)

## 2014-01-22 NOTE — ED Provider Notes (Signed)
CSN: [id number]     Arrival date & time 01/22/14  1627 History   First MD Initiated Contact with Patient 01/22/14 1913     Chief Complaint  Patient presents with  . Abdominal Pain   (Consider location/radiation/quality/duration/timing/severity/associated sxs/prior Treatment) Patient is a 33 y.o. female presenting with abdominal pain.  Abdominal Pain  33 yo female presents with LEFT sided flank pain that started at 7:30 this morning. Patients states the pain is sharp 10/10 intermittent pain that waxes and wanes. Pain radiates to left back. Patient states nothing seems to make pain better. Pain worse with twisting movement. Patietn denies N/V/D/C. Admits to increased urinary frequency but denies hematuria. No hx of kidney stones in family. Denies fever/chills, CP, SOB, vaginal pain/discharge, vaginal bleeding, and painful intercourse. Patient admits hx of tubal pregnancy and tubal ligation. Denies any addition abdominal surgeries. Occasional alcohol use.   Past Medical History  Diagnosis Date  . Achalasia     esphog stretched several times  . Abnormal Pap smear   . Depression   . HSV-2 infection   . PVC's (premature ventricular contractions)   . Anxiety   . Blood transfusion 01/2006    [place]  . GERD (gastroesophageal reflux disease)     no meds - diet controlled  . Asthma     rarely uses inhaler  . Arthritis   . History of abnormal Pap smear   . History of chlamydia 1990's  . Fibromyalgia    Past Surgical History  Procedure Laterality Date  . Tonsillectomy    . Adenoidectomy  12/2002  . Esophagogastroduodenoscopy      with esophageal dilation x 3  . Dilation and curettage of uterus  1999  . Breast surgery  07/2001    tumor removed right breast  . Dilation and curettage of uterus  01/2006    retained products  . Bladder suspension  06/11/2012    Procedure: TRANSVAGINAL TAPE (TVT) PROCEDURE;  Surgeon: Delice Lesch, MD;  Location: [facility];  Service: Gynecology;  Laterality: N/A;   Tension Free Vaginal Tape/cystoscopy  . Laparoscopic tubal ligation  06/11/2012    Procedure: LAPAROSCOPIC TUBAL LIGATION;  Surgeon: Delice Lesch, MD;  Location: [facility];  Service: Gynecology;  Laterality: Bilateral;  Laparoscopic Bilateral Fulgeration  . Cystoscopy  06/11/2012    Procedure: CYSTOSCOPY;  Surgeon: Delice Lesch, MD;  Location: [facility];  Service: Gynecology;  Laterality: N/A;  . Foreign body removal  06/11/2012    Procedure: REMOVAL FOREIGN BODY EXTREMITY;  Surgeon: Delice Lesch, MD;  Location: [facility];  Service: Gynecology;  Laterality: Right;  Removal of Implanon    Family History  Problem Relation Age of Onset  . Heart disease Father   . Hypertension Father   . Stroke Mother   . Hypertension Mother   . Heart attack Mother   . Breast cancer Mother   . Neuropathy Mother   . Arthritis Mother   . Neuropathy Sister   . Arthritis Sister    History  Substance Use Topics  . Smoking status: Current Some Day Smoker -- 0.25 packs/day    Types: Cigarettes  . Smokeless tobacco: Never Used     Comment: HAS CUT BACK  . Alcohol Use: Yes     Comment: on occasion   OB History   Grav Para Term Preterm Abortions TAB SAB Ect Mult Living   4 2 2  0 2  2   2      Review of Systems  Gastrointestinal:  Positive for abdominal pain.  All other systems reviewed and are negative.    Allergies  Review of patient's allergies indicates no known allergies.  Home Medications   Current Outpatient Rx  Name  Route  Sig  Dispense  Refill  . amitriptyline (ELAVIL) 100 MG tablet   Oral   Take 100 mg by mouth at bedtime.         . beclomethasone (QVAR) 80 MCG/ACT inhaler   Inhalation   Inhale 2 puffs into the lungs every 6 (six) hours as needed (for wheezing and shortness of breath). For asthma         . carvedilol (COREG) 6.25 MG tablet   Oral   Take 1 tablet (6.25 mg total) by mouth 2 (two) times daily.   60 tablet   1   . clonazePAM (KLONOPIN) 1 MG tablet   Oral    Take 1 mg by mouth at bedtime as needed. For anxiety         . EPINEPHrine (EPIPEN) 0.3 mg/0.3 mL SOAJ injection   Intramuscular   Inject 0.3 mg into the muscle once as needed (for allergies).         Marland Kitchen lisinopril (PRINIVIL,ZESTRIL) 2.5 MG tablet   Oral   Take 1 tablet (2.5 mg total) by mouth daily.   30 tablet   4   . metoprolol succinate (TOPROL-XL) 25 MG 24 hr tablet   Oral   Take 1 tablet (25 mg total) by mouth daily. Take with or immediately following a meal.   30 tablet   1   . ciprofloxacin (CIPRO) 500 MG tablet   Oral   Take 1 tablet (500 mg total) by mouth 2 (two) times daily.   20 tablet   0   . HYDROcodone-acetaminophen (NORCO/VICODIN) 5-325 MG per tablet   Oral   Take 1-2 tablets by mouth every 6 (six) hours as needed for moderate pain or severe pain.   15 tablet   0   . metroNIDAZOLE (FLAGYL) 500 MG tablet   Oral   Take 1 tablet (500 mg total) by mouth 3 (three) times daily.   30 tablet   0   . ondansetron (ZOFRAN ODT) 4 MG disintegrating tablet      4mg  ODT q4 hours prn nausea/vomit   15 tablet   0    BP 127/83  Pulse 82  Temp(Src) 98.1 F (36.7 C) (Oral)  Resp 16  Ht 5\' 9"  (1.753 m)  Wt 185 lb (83.915 kg)  BMI 27.31 kg/m2  SpO2 100%  LMP 01/07/2014 Physical Exam  Nursing note and vitals reviewed. Constitutional: She is oriented to person, place, and time. She appears well-developed and well-nourished. No distress.  HENT:  Head: Normocephalic and atraumatic.  Neck: Normal range of motion. Neck supple.  Cardiovascular: Normal rate and regular rhythm.  Exam reveals no gallop and no friction rub.   No murmur heard. Pulmonary/Chest: Effort normal and breath sounds normal. No respiratory distress. She has no wheezes. She has no rales.  Abdominal: Soft. Bowel sounds are normal. She exhibits no distension. There is no hepatosplenomegaly. There is tenderness in the left lower quadrant. There is no rigidity, no rebound, no guarding, no CVA  tenderness, no tenderness at McBurney's point and negative Murphy's sign.    Musculoskeletal: Normal range of motion. She exhibits no edema.  Neurological: She is alert and oriented to person, place, and time.  Skin: Skin is warm and dry. She is not diaphoretic.  Psychiatric: She  has a normal mood and affect. Her behavior is normal.    ED Course  Procedures (including critical care time) Labs Review Labs Reviewed  CBC WITH DIFFERENTIAL - Abnormal; Notable for the following:    WBC 15.4 (*)    Neutro Abs 10.6 (*)    Monocytes Absolute 1.1 (*)    All other components within normal limits  COMPREHENSIVE METABOLIC PANEL - Abnormal; Notable for the following:    Glucose, Bld 103 (*)    All other components within normal limits  LIPASE, BLOOD  URINALYSIS, ROUTINE W REFLEX MICROSCOPIC  POCT I-STAT TROPONIN I  POCT PREGNANCY, URINE   Imaging Review Ct Abdomen Pelvis Wo Contrast  01/22/2014   CLINICAL DATA:  Left abdominal pain, cramping  EXAM: CT ABDOMEN AND PELVIS WITHOUT CONTRAST  TECHNIQUE: Multidetector CT imaging of the abdomen and pelvis was performed following the standard protocol without IV contrast.  COMPARISON:  None.  FINDINGS: Mild nonspecific patchy ground-glass opacities in the lower lobes with basilar atelectasis. Normal heart size. No pericardial or pleural effusion. No hiatal hernia.  Abdomen: Liver, biliary system, pancreas, spleen, accessory splenule, adrenal glands, and kidneys are within normal limits for age and demonstrate no acute process by noncontrast imaging. No acute hydronephrosis, perinephric inflammatory process, hydroureter, or ureteral calculus demonstrated on either side. Incidental cholelithiasis noted.  In the left mid to lower abdomen, there is focal pericolonic strandy edema/ inflammation with associated wall thickening of the left descending colon. Suspect minor diverticular changes well. Findings compatible with focal diverticulitis/ colitis.  Negative for  bowel obstruction, dilatation, ileus, abscess, or free air.  Normal appendix demonstrated.  Negative for aneurysm or retroperitoneal hemorrhage.  The pelvis: Trace pelvic free fluid, likely physiologic. Uterus and adnexal normal in size. Urinary bladder unremarkable. Pelvic calcifications consistent with venous phleboliths. No acute distal bowel process.  No acute osseous finding.  IMPRESSION: Left descending colon focal diverticulitis versus colitis.  No associated obstruction, perforation, or abscess  Cholelithiasis  Physiologic pelvic free fluid   Electronically Signed   By: Daryll Brod M.D.   On: 01/22/2014 22:36    EKG Interpretation    Date/Time:  Sunday January 22 2014 16:56:40 EST Ventricular Rate:  102 PR Interval:  142 QRS Duration: 84 QT Interval:  352 QTC Calculation: 458 R Axis:   29 Text Interpretation:  Sinus tachycardia Anterior infarct , age undetermined Abnormal ECG ED PHYSICIAN INTERPRETATION AVAILABLE IN [facility] Confirmed by TEST, RECORD ([id number]) on 01/24/2014 9:28:02 AM            MDM   1. Colitis    Patient afebrile. Leukocytosis to 15.4  Lipase negative UA normal. Urine preg normal. Troponin negative EKG shows sinus tach, Tachycardia resolved throughout stay in ED.    CT consistent with colitis vs diverticulitis. Cholelithiasis noted. No obstructions, perforation, or abscess formation.   Patient appears non toxic in NAD, and stable for discharge. Plan to treat patient's colitis on outpatient basis and have patient follow up in 2 days with PCP to ensure symptom improvement and adequate empiric therapy. Patient given return precautions for worsening pain, fever/chills, or development of new symptoms such as bloody stools. Patient agrees with plan. Discharged in good condition.   Meds given in ED:  Medications  morphine 4 MG/ML injection 4 mg (4 mg Intravenous Given 01/22/14 2051)  ciprofloxacin (CIPRO) tablet 500 mg (500 mg Oral Given 01/22/14 2340)   metroNIDAZOLE (FLAGYL) tablet 500 mg (500 mg Oral Given 01/22/14 2340)    Discharge  Medication List as of 01/22/2014 10:52 PM    START taking these medications   Details  ciprofloxacin (CIPRO) 500 MG tablet Take 1 tablet (500 mg total) by mouth 2 (two) times daily., Starting 01/22/2014, Until Discontinued, Print    HYDROcodone-acetaminophen (NORCO/VICODIN) 5-325 MG per tablet Take 1-2 tablets by mouth every 6 (six) hours as needed for moderate pain or severe pain., Starting 01/22/2014, Until Discontinued, Print    metroNIDAZOLE (FLAGYL) 500 MG tablet Take 1 tablet (500 mg total) by mouth 3 (three) times daily., Starting 01/22/2014, Until Discontinued, Print    ondansetron (ZOFRAN ODT) 4 MG disintegrating tablet 4mg  ODT q4 hours prn nausea/vomit, Print              Sherrie George, [place] 01/24/14 2211

## 2014-01-22 NOTE — ED Notes (Signed)
Presents with left sided mid to lower abdominal pain. Pt states, "I hurts so bad and it feels like something is twisting. REal bad cramps and then it will stop and then start again. I am nauseated and my stomach is all swollen up. It hurts worse when people touch it" denies dysuria and diarrhea.

## 2014-01-23 ENCOUNTER — Other Ambulatory Visit: Payer: Medicaid Other

## 2014-01-25 NOTE — ED Provider Notes (Signed)
Medical screening examination/treatment/procedure(s) were performed by non-physician practitioner and as supervising physician I was immediately available for consultation/collaboration.  EKG Interpretation    Date/Time:  Sunday January 22 2014 16:56:40 EST Ventricular Rate:  102 PR Interval:  142 QRS Duration: 84 QT Interval:  352 QTC Calculation: 458 R Axis:   29 Text Interpretation:  Sinus tachycardia Anterior infarct , age undetermined Abnormal ECG ED PHYSICIAN INTERPRETATION AVAILABLE IN [facility] [place] Confirmed by TEST, RECORD ([id number]) on 01/24/2014 9:28:02 AM             Threasa Beards, MD 01/25/14 [phone]

## 2014-01-31 ENCOUNTER — Other Ambulatory Visit: Payer: Self-pay | Admitting: *Deleted

## 2014-01-31 DIAGNOSIS — R931 Abnormal findings on diagnostic imaging of heart and coronary circulation: Secondary | ICD-10-CM

## 2014-01-31 DIAGNOSIS — I493 Ventricular premature depolarization: Secondary | ICD-10-CM

## 2014-02-02 ENCOUNTER — Telehealth: Payer: Self-pay | Admitting: *Deleted

## 2014-02-02 NOTE — Telephone Encounter (Signed)
Pt wanting more explanation of reasoning for holter monitor per stress test results.  (she asked this when [facility] called to schedule appt) lmtcb to discuss

## 2014-02-06 ENCOUNTER — Emergency Department (HOSPITAL_COMMUNITY)
Admission: EM | Admit: 2014-02-06 | Discharge: 2014-02-06 | Disposition: A | Discharge status: Home / Self Care | Payer: Medicaid Other | Attending: Emergency Medicine | Admitting: Emergency Medicine

## 2014-02-06 ENCOUNTER — Emergency Department (HOSPITAL_COMMUNITY): Payer: Medicaid Other

## 2014-02-06 ENCOUNTER — Encounter (HOSPITAL_COMMUNITY): Payer: Self-pay | Admitting: Emergency Medicine

## 2014-02-06 DIAGNOSIS — F17200 Nicotine dependence, unspecified, uncomplicated: Secondary | ICD-10-CM | POA: Insufficient documentation

## 2014-02-06 DIAGNOSIS — K220 Achalasia of cardia: Secondary | ICD-10-CM | POA: Insufficient documentation

## 2014-02-06 DIAGNOSIS — F3289 Other specified depressive episodes: Secondary | ICD-10-CM | POA: Insufficient documentation

## 2014-02-06 DIAGNOSIS — B009 Herpesviral infection, unspecified: Secondary | ICD-10-CM | POA: Insufficient documentation

## 2014-02-06 DIAGNOSIS — J45909 Unspecified asthma, uncomplicated: Secondary | ICD-10-CM | POA: Insufficient documentation

## 2014-02-06 DIAGNOSIS — J069 Acute upper respiratory infection, unspecified: Secondary | ICD-10-CM

## 2014-02-06 DIAGNOSIS — I4949 Other premature depolarization: Secondary | ICD-10-CM | POA: Insufficient documentation

## 2014-02-06 DIAGNOSIS — Z8619 Personal history of other infectious and parasitic diseases: Secondary | ICD-10-CM | POA: Insufficient documentation

## 2014-02-06 DIAGNOSIS — M129 Arthropathy, unspecified: Secondary | ICD-10-CM | POA: Insufficient documentation

## 2014-02-06 DIAGNOSIS — Z8742 Personal history of other diseases of the female genital tract: Secondary | ICD-10-CM | POA: Insufficient documentation

## 2014-02-06 DIAGNOSIS — R05 Cough: Secondary | ICD-10-CM | POA: Insufficient documentation

## 2014-02-06 DIAGNOSIS — K219 Gastro-esophageal reflux disease without esophagitis: Secondary | ICD-10-CM | POA: Insufficient documentation

## 2014-02-06 DIAGNOSIS — IMO0001 Reserved for inherently not codable concepts without codable children: Secondary | ICD-10-CM | POA: Insufficient documentation

## 2014-02-06 DIAGNOSIS — R059 Cough, unspecified: Secondary | ICD-10-CM | POA: Insufficient documentation

## 2014-02-06 DIAGNOSIS — J189 Pneumonia, unspecified organism: Secondary | ICD-10-CM

## 2014-02-06 DIAGNOSIS — F411 Generalized anxiety disorder: Secondary | ICD-10-CM | POA: Insufficient documentation

## 2014-02-06 DIAGNOSIS — Z79899 Other long term (current) drug therapy: Secondary | ICD-10-CM | POA: Insufficient documentation

## 2014-02-06 DIAGNOSIS — F329 Major depressive disorder, single episode, unspecified: Secondary | ICD-10-CM | POA: Insufficient documentation

## 2014-02-06 DIAGNOSIS — J3489 Other specified disorders of nose and nasal sinuses: Secondary | ICD-10-CM | POA: Insufficient documentation

## 2014-02-06 DIAGNOSIS — R6889 Other general symptoms and signs: Secondary | ICD-10-CM | POA: Insufficient documentation

## 2014-02-06 MED ORDER — ALBUTEROL SULFATE HFA 108 (90 BASE) MCG/ACT IN AERS
2.0000 | INHALATION_SPRAY | RESPIRATORY_TRACT | Status: DC | PRN
  Administered 2014-02-06: 2 via RESPIRATORY_TRACT
  Filled 2014-02-06: qty 6.7

## 2014-02-06 MED ORDER — OXYCODONE-ACETAMINOPHEN 5-325 MG PO TABS
2.0000 | ORAL_TABLET | Freq: Once | ORAL | Status: AC
Start: 2014-02-06 — End: 2014-02-06
  Administered 2014-02-06: 2 via ORAL
  Filled 2014-02-06: qty 2

## 2014-02-06 MED ORDER — AZITHROMYCIN 250 MG PO TABS
500.0000 mg | ORAL_TABLET | Freq: Once | ORAL | Status: AC
  Administered 2014-02-06: 500 mg via ORAL
  Filled 2014-02-06: qty 2

## 2014-02-06 MED ORDER — LEVOFLOXACIN 500 MG PO TABS: 500.0000 mg | ORAL_TABLET | Freq: Every day | ORAL | Status: DC

## 2014-02-06 MED ORDER — TRAMADOL HCL 50 MG PO TABS: 50.0000 mg | ORAL_TABLET | Freq: Four times a day (QID) | ORAL | Status: DC | PRN

## 2014-02-06 NOTE — Discharge Instructions (Signed)

## 2014-02-06 NOTE — ED Provider Notes (Signed)
CSN: [id number]     Arrival date & time 02/06/14  0020 History   First MD Initiated Contact with Patient 02/06/14 0226     Chief Complaint  Patient presents with  . URI  . Cough  . Shortness of Breath  . Nasal Congestion   (Consider location/radiation/quality/duration/timing/severity/associated sxs/prior Treatment) HPI History per patient. Nonproductive cough, runny nose, nasal congestion, nausea and some difficulty breathing for the last week. No reported fevers. No vomiting or diarrhea. Some moderate headache tonight. Has a history of anxiety, cardiomyopathy, asthma - she is requesting an inhaler. She is followed by Velora Heckler and has PCP follow up today but did not want to wait to be evaluated.   Past Medical History  Diagnosis Date  . Achalasia     esphog stretched several times  . Abnormal Pap smear   . Depression   . HSV-2 infection   . PVC's (premature ventricular contractions)   . Anxiety   . Blood transfusion 01/2006    [place]  . GERD (gastroesophageal reflux disease)     no meds - diet controlled  . Asthma     rarely uses inhaler  . Arthritis   . History of abnormal Pap smear   . History of chlamydia 1990's  . Fibromyalgia    Past Surgical History  Procedure Laterality Date  . Tonsillectomy    . Adenoidectomy  12/2002  . Esophagogastroduodenoscopy      with esophageal dilation x 3  . Dilation and curettage of uterus  1999  . Breast surgery  07/2001    tumor removed right breast  . Dilation and curettage of uterus  01/2006    retained products  . Bladder suspension  06/11/2012    Procedure: TRANSVAGINAL TAPE (TVT) PROCEDURE;  Surgeon: Delice Lesch, MD;  Location: [facility];  Service: Gynecology;  Laterality: N/A;  Tension Free Vaginal Tape/cystoscopy  . Laparoscopic tubal ligation  06/11/2012    Procedure: LAPAROSCOPIC TUBAL LIGATION;  Surgeon: Delice Lesch, MD;  Location: [facility];  Service: Gynecology;  Laterality: Bilateral;  Laparoscopic Bilateral Fulgeration  .  Cystoscopy  06/11/2012    Procedure: CYSTOSCOPY;  Surgeon: Delice Lesch, MD;  Location: [facility];  Service: Gynecology;  Laterality: N/A;  . Foreign body removal  06/11/2012    Procedure: REMOVAL FOREIGN BODY EXTREMITY;  Surgeon: Delice Lesch, MD;  Location: [facility];  Service: Gynecology;  Laterality: Right;  Removal of Implanon    Family History  Problem Relation Age of Onset  . Heart disease Father   . Hypertension Father   . Stroke Mother   . Hypertension Mother   . Heart attack Mother   . Breast cancer Mother   . Neuropathy Mother   . Arthritis Mother   . Neuropathy Sister   . Arthritis Sister    History  Substance Use Topics  . Smoking status: Current Some Day Smoker -- 0.25 packs/day    Types: Cigarettes  . Smokeless tobacco: Never Used     Comment: HAS CUT BACK  . Alcohol Use: Yes     Comment: on occasion   OB History   Grav Para Term Preterm Abortions TAB SAB Ect Mult Living   4 2 2  0 2  2   2      Review of Systems  Constitutional: Negative for fever and chills.  HENT: Positive for congestion.   Respiratory: Positive for cough. Negative for shortness of breath.   Cardiovascular: Negative for leg swelling.  Gastrointestinal: Negative for  vomiting and abdominal pain.  Genitourinary: Negative for dysuria.  Musculoskeletal: Negative for back pain, neck pain and neck stiffness.  Skin: Negative for rash.  Neurological: Negative for weakness and numbness.  All other systems reviewed and are negative.    Allergies  Review of patient's allergies indicates no known allergies.  Home Medications   Current Outpatient Rx  Name  Route  Sig  Dispense  Refill  . amitriptyline (ELAVIL) 100 MG tablet   Oral   Take 100 mg by mouth at bedtime.         . beclomethasone (QVAR) 80 MCG/ACT inhaler   Inhalation   Inhale 2 puffs into the lungs every 6 (six) hours as needed (for wheezing and shortness of breath). For asthma         . carvedilol (COREG) 6.25 MG tablet    Oral   Take 1 tablet (6.25 mg total) by mouth 2 (two) times daily.   60 tablet   1   . ciprofloxacin (CIPRO) 500 MG tablet   Oral   Take 1 tablet (500 mg total) by mouth 2 (two) times daily.   20 tablet   0   . clonazePAM (KLONOPIN) 1 MG tablet   Oral   Take 1 mg by mouth at bedtime as needed. For anxiety         . EPINEPHrine (EPIPEN) 0.3 mg/0.3 mL SOAJ injection   Intramuscular   Inject 0.3 mg into the muscle once as needed (for allergies).         Marland Kitchen HYDROcodone-acetaminophen (NORCO/VICODIN) 5-325 MG per tablet   Oral   Take 1-2 tablets by mouth every 6 (six) hours as needed for moderate pain or severe pain.   15 tablet   0   . lisinopril (PRINIVIL,ZESTRIL) 2.5 MG tablet   Oral   Take 1 tablet (2.5 mg total) by mouth daily.   30 tablet   4   . metoprolol succinate (TOPROL-XL) 25 MG 24 hr tablet   Oral   Take 1 tablet (25 mg total) by mouth daily. Take with or immediately following a meal.   30 tablet   1   . metroNIDAZOLE (FLAGYL) 500 MG tablet   Oral   Take 1 tablet (500 mg total) by mouth 3 (three) times daily.   30 tablet   0   . ondansetron (ZOFRAN ODT) 4 MG disintegrating tablet      4mg  ODT q4 hours prn nausea/vomit   15 tablet   0    BP 147/85  Pulse 83  Temp(Src) 98 F (36.7 C) (Oral)  Resp 16  Ht 5\' 9"  (1.753 m)  Wt 196 lb (88.905 kg)  BMI 28.93 kg/m2  SpO2 98%  LMP 01/28/2014 Physical Exam  Constitutional: She is oriented to person, place, and time. She appears well-developed and well-nourished.  HENT:  Head: Normocephalic and atraumatic.  Eyes: EOM are normal. Pupils are equal, round, and reactive to light.  Neck: Neck supple. No tracheal deviation present.  Cardiovascular: Normal rate, regular rhythm and intact distal pulses.   Pulmonary/Chest: Effort normal and breath sounds normal. No stridor. No respiratory distress. She has no wheezes. She has no rales. She exhibits no tenderness.  Abdominal: Soft. She exhibits no distension.  There is no tenderness.  Musculoskeletal: Normal range of motion. She exhibits no edema and no tenderness.  Neurological: She is alert and oriented to person, place, and time. No cranial nerve deficit.  Skin: Skin is warm and dry.  ED Course  Procedures (including critical care time) Labs Review Labs Reviewed - No data to display Imaging Review Dg Chest 2 View  02/06/2014   CLINICAL DATA:  Cough, shortness of breath  EXAM: CHEST  2 VIEW  COMPARISON:  November 06, 2012  FINDINGS: The heart size and mediastinal contours are within normal limits. There is mild patchy opacity medial left lung base lung base. There is no pulmonary edema or pleural effusion. The visualized skeletal structures are stable.  IMPRESSION: Possible early pneumonia of medial left lung base.   Electronically Signed   By: Abelardo Diesel M.D.   On: 02/06/2014 01:25    Albuterol inhaler provided with some relief.  On recheck, also complaining of difficulty swallowing pills, feels like it gets stuck. No choking. Tolerates by mouth fluids without difficulty.   Antibiotics provided for possible pneumonia on chest x-ray  Plan discharge home, followup today with primary care physician as scheduled. Patient agrees to followup with her doctor regarding difficulty swallowing. She does not have any reflux symptoms otherwise. Prescription for antibiotics provided with strict return precautions verbalized as understood. MDM  Diagnosis: URI symptoms with possible pneumonia on chest x-ray  Treated with medications as above and symptomatically improving. Chest x-ray reviewed. Vital signs and nurses notes and previous records reviewed   Teressa Lower, MD 02/06/14 [phone]

## 2014-02-06 NOTE — Telephone Encounter (Signed)
Pt scheduled to pick up monitor 2/12. Closing encounter.

## 2014-02-06 NOTE — ED Notes (Signed)
C/o non-productive cough, runny nose, nasal congestion, nausea, and sob since last Sunday.  Also reports headache since 6pm.  Denies vomiting and diarrhea.

## 2014-02-09 ENCOUNTER — Encounter (INDEPENDENT_AMBULATORY_CARE_PROVIDER_SITE_OTHER): Payer: Medicaid Other

## 2014-02-09 ENCOUNTER — Encounter: Payer: Self-pay | Admitting: *Deleted

## 2014-02-09 DIAGNOSIS — R002 Palpitations: Secondary | ICD-10-CM

## 2014-02-09 DIAGNOSIS — I4949 Other premature depolarization: Secondary | ICD-10-CM

## 2014-02-09 DIAGNOSIS — I493 Ventricular premature depolarization: Secondary | ICD-10-CM

## 2014-02-09 DIAGNOSIS — R931 Abnormal findings on diagnostic imaging of heart and coronary circulation: Secondary | ICD-10-CM

## 2014-02-09 NOTE — Progress Notes (Signed)
Patient ID: Raven [name], female   DOB: 08/01/1980, 33 y.o.   MRN: [id number] E-Cardio 48 hour holter monitor applied to patient.

## 2014-02-20 ENCOUNTER — Encounter: Payer: Self-pay | Admitting: Gastroenterology

## 2014-03-27 ENCOUNTER — Ambulatory Visit (INDEPENDENT_AMBULATORY_CARE_PROVIDER_SITE_OTHER): Payer: Medicaid Other | Admitting: Gastroenterology

## 2014-03-27 ENCOUNTER — Encounter: Payer: Self-pay | Admitting: Gastroenterology

## 2014-03-27 VITALS — BP 118/78 | HR 84 | Ht 68.5 in | Wt 195.2 lb

## 2014-03-27 DIAGNOSIS — R1032 Left lower quadrant pain: Secondary | ICD-10-CM

## 2014-03-27 DIAGNOSIS — R1319 Other dysphagia: Secondary | ICD-10-CM

## 2014-03-27 HISTORY — DX: Left lower quadrant pain: R10.32

## 2014-03-27 MED ORDER — HYOSCYAMINE SULFATE ER 0.375 MG PO TBCR: EXTENDED_RELEASE_TABLET | ORAL | Status: DC

## 2014-03-27 MED ORDER — NA SULFATE-K SULFATE-MG SULF 17.5-3.13-1.6 GM/177ML PO SOLN: 1.0000 | Freq: Once | ORAL | Status: DC

## 2014-03-27 NOTE — Patient Instructions (Addendum)
You have been given a separate informational sheet regarding your tobacco use, the importance of quitting and local resources to help you quit.  You have been scheduled for an endoscopy with propofol. Please follow written instructions given to you at your visit today. If you use inhalers (even only as needed), please bring them with you on the day of your procedure. Your physician has requested that you go to www.startemmi.com and enter the access code given to you at your visit today. This web site gives a general overview about your procedure. However, you should still follow specific instructions given to you by our office regarding your preparation for the procedure.  You have been scheduled for a colonoscopy with propofol. Please follow written instructions given to you at your visit today.  Please pick up your prep kit at the pharmacy within the next 1-3 days. If you use inhalers (even only as needed), please bring them with you on the day of your procedure. Your physician has requested that you go to www.startemmi.com and enter the access code given to you at your visit today. This web site gives a general overview about your procedure. However, you should still follow specific instructions given to you by our office regarding your preparation for the procedure.

## 2014-03-27 NOTE — Assessment & Plan Note (Signed)
Patient has recurrent dysphagia without obvious stricture as evidenced by prior esophagograms.  Question of achalasia has been raised although manometry was not diagnostic.  Eosinophilic esophagitis must also be considered though there are no obvious mucosal abnormalities previously described.  Recommendations #1 upper endoscopy.  Plan to obtain biopsies to rule out eosinophilic esophagitis #2 if 1 is not diagnostic will repeat an esophageal manometry

## 2014-03-27 NOTE — Assessment & Plan Note (Signed)
Patient has persistent complaints of lower abdominal pain with CT in January demonstrating possible focal colitis versus diverticulitis.  Longevity of symptoms mitigate against acute diverticulitis.  Recommendations #1 colonoscopy #2 trial of hyomax

## 2014-03-27 NOTE — Progress Notes (Signed)
_                                                                                                                History of Present Illness: Pleasant 33 year old Afro-American female with history of dysphagia referred for reevaluation of dysphagia and for abdominal pain.  She has been seen repeatedly in the past as recently as 2011 for recurrent dysphagia.  She has undergone extensive workup including esophageal manometry, barium esophagram, and endoscopy.  Workup is summarized as follows;  status post esophageal dilatation with [facility] dilator to 18 mm in December, 2009  status post barium esophagram x2 in January, 2010 and August, 2010 because of severe dysphagia to solids and liquids. Both x-ray studies negative  esophageal manometry in January, 2011 demonstrating normal resting LES pressure with 84% relaxation, 89% peristaltic contractions and 11% nonperistalytic (nontransmitted) contractions  status post Botox injection of the LES with moderate improvement but incomplete  She has responded to Botox injection in the past.  She's complaining of severe dysphagia solids and liquids.  Soon after eating she develops coughing.  She has had pneumonia.  Her voice is hoarse.  She denies chest pain or pyrosis.  Over the past year she's been complaining of bloating and sharp pains in both the left to right lower quadrants.  CT scan in January, 2015, demonstrated a focal colitis versus diverticulitis with pericolonic stranding consistent with either colitis or diverticulitis.  She was treated with metronidazole and Flagyl with temporary relief.  Bowels are erratic.  She denies rectal bleeding.  She continues to complain of lower abdominal pain     Past Medical History  Diagnosis Date  . Achalasia     esphog stretched several times  . Abnormal Pap smear   . Depression   . HSV-2 infection   . PVC's (premature ventricular contractions)   . Anxiety   . Blood transfusion 01/2006    [place]   . GERD (gastroesophageal reflux disease)     no meds - diet controlled  . Asthma     rarely uses inhaler  . Arthritis   . History of abnormal Pap smear   . History of chlamydia 1990's  . Fibromyalgia   . Pneumonia   . Hiatal hernia   . Cardiomyopathy   . Status post dilation of esophageal narrowing   . HTN (hypertension)   . Heart murmur    Past Surgical History  Procedure Laterality Date  . Tonsillectomy    . Adenoidectomy  12/2002  . Esophagogastroduodenoscopy      with esophageal dilation x 3  . Dilation and curettage of uterus  1999  . Breast surgery  07/2001    tumor removed right breast  . Dilation and curettage of uterus  01/2006    retained products  . Bladder suspension  06/11/2012    Procedure: TRANSVAGINAL TAPE (TVT) PROCEDURE;  Surgeon: Delice Lesch, MD;  Location: [facility];  Service: Gynecology;  Laterality: N/A;  Tension Free Vaginal Tape/cystoscopy  . Laparoscopic tubal  ligation  06/11/2012    Procedure: LAPAROSCOPIC TUBAL LIGATION;  Surgeon: Delice Lesch, MD;  Location: [facility];  Service: Gynecology;  Laterality: Bilateral;  Laparoscopic Bilateral Fulgeration  . Cystoscopy  06/11/2012    Procedure: CYSTOSCOPY;  Surgeon: Delice Lesch, MD;  Location: [facility];  Service: Gynecology;  Laterality: N/A;  . Foreign body removal  06/11/2012    Procedure: REMOVAL FOREIGN BODY EXTREMITY;  Surgeon: Delice Lesch, MD;  Location: [facility];  Service: Gynecology;  Laterality: Right;  Removal of Implanon    family history includes Arthritis in her mother and sister; Breast cancer in her mother; Heart attack in her mother; Heart disease in her father; Hypertension in her father and mother; Neuropathy in her mother and sister; Stroke in her mother. Current Outpatient Prescriptions  Medication Sig Dispense Refill  . amitriptyline (ELAVIL) 100 MG tablet Take 100 mg by mouth at bedtime.      . beclomethasone (QVAR) 80 MCG/ACT inhaler Inhale 2 puffs into the lungs every 6 (six)  hours as needed (for wheezing and shortness of breath). For asthma      . carvedilol (COREG) 6.25 MG tablet Take 1 tablet (6.25 mg total) by mouth 2 (two) times daily.  60 tablet  1  . clonazePAM (KLONOPIN) 1 MG tablet Take 1 mg by mouth at bedtime as needed. For anxiety      . EPINEPHrine (EPIPEN) 0.3 mg/0.3 mL SOAJ injection Inject 0.3 mg into the muscle once as needed (for allergies).      [place] Kitchen HYDROcodone-acetaminophen (NORCO/VICODIN) 5-325 MG per tablet Take 1-2 tablets by mouth every 6 (six) hours as needed for moderate pain or severe pain.  15 tablet  0  . lisinopril (PRINIVIL,ZESTRIL) 2.5 MG tablet Take 1 tablet (2.5 mg total) by mouth daily.  30 tablet  4  . metoprolol succinate (TOPROL-XL) 25 MG 24 hr tablet Take 1 tablet (25 mg total) by mouth daily. Take with or immediately following a meal.  30 tablet  1   No current facility-administered medications for this visit.   Allergies as of 03/27/2014  . (No Known Allergies)    reports that she has been smoking Cigarettes.  She has been smoking about 0.25 packs per day. She has never used smokeless tobacco. She reports that she drinks alcohol. She reports that she does not use illicit drugs.     Review of Systems: Pertinent positive and negative review of systems were noted in the above HPI section. All other review of systems were otherwise negative.  Vital signs were reviewed in today's medical record Physical Exam: General: Well developed , well nourished, no acute distress Skin: anicteric Head: Normocephalic and atraumatic Eyes:  sclerae anicteric, EOMI Ears: Normal auditory acuity Mouth: No deformity or lesions Neck: Supple, no masses or thyromegaly Lungs: Clear throughout to auscultation Heart: Regular rate and rhythm; no murmurs, rubs or bruits Abdomen: Soft, non tender and non distended. No masses, hepatosplenomegaly or hernias noted. Normal Bowel sounds Rectal:deferred Musculoskeletal: Symmetrical with no gross  deformities  Skin: No lesions on visible extremities Pulses:  Normal pulses noted Extremities: No clubbing, cyanosis, edema or deformities noted Neurological: Alert oriented x 4, grossly nonfocal Cervical Nodes:  No significant cervical adenopathy Inguinal Nodes: No significant inguinal adenopathy Psychological:  Alert and cooperative. Normal mood and affect  See Assessment and Plan under Problem List

## 2014-04-12 ENCOUNTER — Telehealth: Payer: Self-pay | Admitting: Internal Medicine

## 2014-04-12 ENCOUNTER — Other Ambulatory Visit: Payer: Self-pay | Admitting: *Deleted

## 2014-04-12 MED ORDER — CARVEDILOL 6.25 MG PO TABS: 6.2500 mg | ORAL_TABLET | Freq: Two times a day (BID) | ORAL | Status: DC

## 2014-04-12 NOTE — Telephone Encounter (Signed)
Pt asking about discussing tests results and when she was coming in again -- explained that letter went out this week for her to call and make f/u appt with Dr. Caryl Comes next month, and we would discuss results then. She is agreeable to this. She also was asking for refill on Carvedilol and Lisinopril -- refill for coreg sent to [facility], per her request; and explained she still had remaining refills on Lisinopril. Pt thankful for help and agreeable to discussing everything at [place] w/ Dr. Caryl Comes.

## 2014-04-12 NOTE — Telephone Encounter (Signed)
New Message ° °Pt returned call for results. °

## 2014-04-13 ENCOUNTER — Encounter: Payer: Self-pay | Admitting: Gastroenterology

## 2014-04-13 ENCOUNTER — Ambulatory Visit (AMBULATORY_SURGERY_CENTER): Payer: Medicaid Other | Admitting: Gastroenterology

## 2014-04-13 VITALS — BP 140/88 | HR 64 | Temp 98.4°F | Resp 12

## 2014-04-13 DIAGNOSIS — K2940 Chronic atrophic gastritis without bleeding: Secondary | ICD-10-CM

## 2014-04-13 DIAGNOSIS — K2970 Gastritis, unspecified, without bleeding: Secondary | ICD-10-CM

## 2014-04-13 DIAGNOSIS — R1319 Other dysphagia: Secondary | ICD-10-CM

## 2014-04-13 DIAGNOSIS — K2990 Gastroduodenitis, unspecified, without bleeding: Secondary | ICD-10-CM

## 2014-04-13 MED ORDER — SODIUM CHLORIDE 0.9 % IV SOLN: 500.0000 mL | INTRAVENOUS | Status: DC

## 2014-04-13 NOTE — Patient Instructions (Signed)

## 2014-04-13 NOTE — Op Note (Signed)
[place]  [address] [place], [address]   ENDOSCOPY PROCEDURE REPORT  PATIENT: Raven, [name]  MR#: [id number] BIRTHDATE: 08/01/1980 , 33  yrs. old GENDER: Female ENDOSCOPIST: Inda Castle, MD REFERRED BY: PROCEDURE DATE:  04/13/2014 PROCEDURE:  EGD w/ biopsy ASA CLASS:     Class II INDICATIONS:  Dysphagia. MEDICATIONS: MAC sedation, administered by CRNA, Propofol (Diprivan) 270 mg IV, and Simethicone 0.6cc PO TOPICAL ANESTHETIC:  DESCRIPTION OF PROCEDURE: After the risks benefits and alternatives of the procedure were thoroughly explained, informed consent was obtained.  The LB [id number] endoscope was introduced through the mouth and advanced to the third portion of the duodenum. Without limitations.  The instrument was slowly withdrawn as the mucosa was fully examined.      There was mild erythema in the duodenal bulb.  In the gastric antrum and body there is several areas of superficial erosions.  Biopsies were taken. No stricture was seen at the GE junction.  With the esophagus partially collapsed there seemed to be some circular ridges but they disappeared with air insufflation.  Multiple biopsies were taken throughout the esophagus to rule out eosinophilic esophagitis.   The remainder of the upper endoscopy exam was otherwise normal.  Retroflexed views revealed no abnormalities. The scope was then withdrawn from the patient and the procedure completed.  COMPLICATIONS: There were no complications. ENDOSCOPIC IMPRESSION: 1.   gastroduodenitis 2.   The remainder of the upper endoscopy exam was otherwise normal  RECOMMENDATIONS: Await biopsy results  REPEAT EXAM:  eSigned:  Inda Castle, MD 04/13/2014 4:10 PM   RA:QTMAU,QJFHL MD

## 2014-04-13 NOTE — Progress Notes (Signed)
Procedure ends, to recovery, report given and VSS. 

## 2014-04-13 NOTE — Progress Notes (Signed)
Called to room to assist during endoscopic procedure.  Patient ID and intended procedure confirmed with present staff. Received instructions for my participation in the procedure from the performing physician.  

## 2014-04-14 ENCOUNTER — Telehealth: Payer: Self-pay

## 2014-04-14 NOTE — Telephone Encounter (Signed)
  Follow up Call-  Call back number 04/13/2014  Post procedure Call Back phone  # [phone]  Permission to leave phone message Yes     Patient questions:  Do you have a fever, pain , or abdominal swelling? no Pain Score  0 *  Have you tolerated food without any problems? yes  Have you been able to return to your normal activities? yes  Do you have any questions about your discharge instructions: Diet   no Medications  no Follow up visit  no  Do you have questions or concerns about your Care? no  Actions: * If pain score is 4 or above: No action needed, pain <4.

## 2014-04-19 ENCOUNTER — Encounter: Payer: Self-pay | Admitting: Gastroenterology

## 2014-05-10 ENCOUNTER — Ambulatory Visit (AMBULATORY_SURGERY_CENTER): Payer: Medicaid Other | Admitting: Gastroenterology

## 2014-05-10 ENCOUNTER — Encounter: Payer: Self-pay | Admitting: Gastroenterology

## 2014-05-10 VITALS — BP 144/76 | HR 60 | Temp 98.1°F | Resp 18 | Ht 68.0 in | Wt 195.0 lb

## 2014-05-10 DIAGNOSIS — K5730 Diverticulosis of large intestine without perforation or abscess without bleeding: Secondary | ICD-10-CM

## 2014-05-10 DIAGNOSIS — D126 Benign neoplasm of colon, unspecified: Secondary | ICD-10-CM

## 2014-05-10 DIAGNOSIS — R1032 Left lower quadrant pain: Secondary | ICD-10-CM

## 2014-05-10 MED ORDER — SODIUM CHLORIDE 0.9 % IV SOLN: 500.0000 mL | INTRAVENOUS | Status: DC

## 2014-05-10 MED ORDER — GLYCOPYRROLATE 2 MG PO TABS: 2.0000 mg | ORAL_TABLET | Freq: Three times a day (TID) | ORAL | Status: DC | PRN

## 2014-05-10 NOTE — Progress Notes (Signed)
Called to room to assist during endoscopic procedure.  Patient ID and intended procedure confirmed with present staff. Received instructions for my participation in the procedure from the performing physician.  

## 2014-05-10 NOTE — Op Note (Signed)
[place]  [address]. [place], [address]   COLONOSCOPY PROCEDURE REPORT  PATIENT: Raven [name], Raven [name]  MR#: [id number] BIRTHDATE: 08/01/1980 , 33  yrs. old GENDER: Female ENDOSCOPIST: Inda Castle, MD REFERRED BY: PROCEDURE DATE:  05/10/2014 PROCEDURE:   Colonoscopy with snare polypectomy First Screening Colonoscopy - Avg.  risk and is 50 yrs.  old or older Yes.  Prior Negative Screening - Now for repeat screening. N/A  History of Adenoma - Now for follow-up colonoscopy & has been > or = to 3 yrs.  N/A  Polyps Removed Today? Yes. ASA CLASS:   Class II INDICATIONS:Abdominal pain; prior CT demonstrating focal left-sided colitis versus diverticulitis MEDICATIONS: MAC sedation, administered by CRNA and propofol (Diprivan) 350mg  IV  DESCRIPTION OF PROCEDURE:   After the risks benefits and alternatives of the procedure were thoroughly explained, informed consent was obtained.  A digital rectal exam revealed no abnormalities of the rectum.   The LB [id number]  endoscope was introduced through the anus and advanced to the cecum, which was identified by both the appendix and ileocecal valve. No adverse events experienced.   The quality of the prep was excellent using Suprep  The instrument was then slowly withdrawn as the colon was fully examined.      COLON FINDINGS: A sessile polyp measuring 8 mm in size was found in the proximal ascending colon.  A polypectomy was performed with a cold snare.  The resection was complete and the polyp tissue was completely retrieved.   A sessile polyp measuring 3 mm in size was found in the ascending colon.  A polypectomy was performed with a cold snare.  The resection was complete and the polyp tissue was completely retrieved.   Mild diverticulosis was noted in the sigmoid colon.   The colon was otherwise normal.  There was no diverticulosis, inflammation, polyps or cancers unless previously stated.  Retroflexed  views revealed no abnormalities. The time to cecum=4 minutes 20 seconds.  Withdrawal time=10 minutes 0 seconds. The scope was withdrawn and the procedure completed. COMPLICATIONS: There were no complications.  ENDOSCOPIC IMPRESSION: 1.   Sessile polyp measuring 8 mm in size was found in the proximal ascending colon; polypectomy was performed with a cold snare 2.   Sessile polyp measuring 3 mm in size was found in the ascending colon; polypectomy was performed with a cold snare 3.   Mild diverticulosis was noted in the sigmoid colon 4.   The colon was otherwise normal  RECOMMENDATIONS: 1.  If the polyp(s) removed today are proven to be adenomatous (pre-cancerous) polyps, you will need a repeat colonoscopy in 5 years.  Otherwise you should continue to follow colorectal cancer screening guidelines for "routine risk" patients with colonoscopy in 10 years.  You will receive a letter within 1-2 weeks with the results of your biopsy as well as final recommendations.  Please call my office if you have not received a letter after 3 weeks. 2.  robinul prn abdominal pain   eSigned:  Inda Castle, MD 05/10/2014 2:05 PM   cc: Reese,Betti MD   PATIENT NAME:  [name], [name] MR#: [id number]

## 2014-05-10 NOTE — Patient Instructions (Signed)

## 2014-05-11 ENCOUNTER — Telehealth: Payer: Self-pay

## 2014-05-11 ENCOUNTER — Telehealth: Payer: Self-pay | Admitting: *Deleted

## 2014-05-11 NOTE — Telephone Encounter (Signed)
No answer, message left for # provided during admission process. Also called # in snapshot demographics, recorder stating she is unavailable with no option for message.

## 2014-05-11 NOTE — Telephone Encounter (Signed)
Pt scheduled for esophageal manometry at [facility] 05/29/14@11am . Pt to arrive 25min prior to appt. Pt mailed information for appt, unable to reach pt by phone.

## 2014-05-11 NOTE — Telephone Encounter (Signed)
Pt scheduled for esophageal manometry at [facility] 05/29/14@11am . Pt to arrive there at 10:30am. Pt mailed appt info and instructions.

## 2014-05-16 ENCOUNTER — Encounter: Payer: Self-pay | Admitting: Gastroenterology

## 2014-05-29 ENCOUNTER — Ambulatory Visit (HOSPITAL_COMMUNITY): Admission: RE | Admit: 2014-05-29 | Payer: Medicaid Other | Source: Ambulatory Visit | Admitting: Gastroenterology

## 2014-05-29 ENCOUNTER — Encounter (HOSPITAL_COMMUNITY): Admission: RE | Payer: Self-pay | Source: Ambulatory Visit

## 2014-05-29 SURGERY — MANOMETRY, ESOPHAGUS

## 2014-06-06 ENCOUNTER — Emergency Department (HOSPITAL_COMMUNITY): Payer: Medicaid Other

## 2014-06-06 ENCOUNTER — Encounter (HOSPITAL_COMMUNITY): Payer: Self-pay | Admitting: Emergency Medicine

## 2014-06-06 ENCOUNTER — Emergency Department (HOSPITAL_COMMUNITY)
Admission: EM | Admit: 2014-06-06 | Discharge: 2014-06-06 | Disposition: A | Discharge status: Home / Self Care | Payer: Medicaid Other | Attending: Emergency Medicine | Admitting: Emergency Medicine

## 2014-06-06 DIAGNOSIS — F17200 Nicotine dependence, unspecified, uncomplicated: Secondary | ICD-10-CM | POA: Insufficient documentation

## 2014-06-06 DIAGNOSIS — Z8739 Personal history of other diseases of the musculoskeletal system and connective tissue: Secondary | ICD-10-CM | POA: Insufficient documentation

## 2014-06-06 DIAGNOSIS — J690 Pneumonitis due to inhalation of food and vomit: Secondary | ICD-10-CM

## 2014-06-06 DIAGNOSIS — Z8679 Personal history of other diseases of the circulatory system: Secondary | ICD-10-CM | POA: Insufficient documentation

## 2014-06-06 DIAGNOSIS — Z8619 Personal history of other infectious and parasitic diseases: Secondary | ICD-10-CM | POA: Insufficient documentation

## 2014-06-06 DIAGNOSIS — I10 Essential (primary) hypertension: Secondary | ICD-10-CM | POA: Insufficient documentation

## 2014-06-06 DIAGNOSIS — Z8742 Personal history of other diseases of the female genital tract: Secondary | ICD-10-CM | POA: Insufficient documentation

## 2014-06-06 DIAGNOSIS — R05 Cough: Secondary | ICD-10-CM

## 2014-06-06 DIAGNOSIS — Z792 Long term (current) use of antibiotics: Secondary | ICD-10-CM | POA: Insufficient documentation

## 2014-06-06 DIAGNOSIS — F3289 Other specified depressive episodes: Secondary | ICD-10-CM | POA: Insufficient documentation

## 2014-06-06 DIAGNOSIS — J45901 Unspecified asthma with (acute) exacerbation: Secondary | ICD-10-CM | POA: Insufficient documentation

## 2014-06-06 DIAGNOSIS — F411 Generalized anxiety disorder: Secondary | ICD-10-CM | POA: Insufficient documentation

## 2014-06-06 DIAGNOSIS — R059 Cough, unspecified: Secondary | ICD-10-CM

## 2014-06-06 DIAGNOSIS — R0789 Other chest pain: Secondary | ICD-10-CM

## 2014-06-06 DIAGNOSIS — R011 Cardiac murmur, unspecified: Secondary | ICD-10-CM | POA: Insufficient documentation

## 2014-06-06 DIAGNOSIS — F329 Major depressive disorder, single episode, unspecified: Secondary | ICD-10-CM | POA: Insufficient documentation

## 2014-06-06 DIAGNOSIS — Z8719 Personal history of other diseases of the digestive system: Secondary | ICD-10-CM | POA: Insufficient documentation

## 2014-06-06 MED ORDER — HYDROCODONE-HOMATROPINE 5-1.5 MG/5ML PO SYRP: 5.0000 mL | ORAL_SOLUTION | Freq: Four times a day (QID) | ORAL | Status: DC | PRN

## 2014-06-06 MED ORDER — AEROCHAMBER PLUS W/MASK MISC: Status: DC

## 2014-06-06 MED ORDER — ALBUTEROL SULFATE HFA 108 (90 BASE) MCG/ACT IN AERS: 2.0000 | INHALATION_SPRAY | RESPIRATORY_TRACT | Status: DC | PRN

## 2014-06-06 MED ORDER — AMOXICILLIN-POT CLAVULANATE 400-57 MG PO CHEW: 2.0000 | CHEWABLE_TABLET | Freq: Two times a day (BID) | ORAL | Status: DC

## 2014-06-06 MED ORDER — ALBUTEROL SULFATE (2.5 MG/3ML) 0.083% IN NEBU
5.0000 mg | INHALATION_SOLUTION | Freq: Once | RESPIRATORY_TRACT | Status: AC
  Administered 2014-06-06: 5 mg via RESPIRATORY_TRACT
  Filled 2014-06-06: qty 6

## 2014-06-06 NOTE — Discharge Instructions (Signed)
1. Medications: Augmentin, albuterol, Hycodan, usual home medications 2. Treatment: rest, drink plenty of fluids, ibuprofen for chest wall pain 3. Follow Up: Please followup with your primary doctor in 2 days for discussion of your diagnoses and further evaluation after today's visit;  please also followup with your pulmonologist for discussion of your pulmonary medications hand your gastroenterologist for concerns of your persistent dysphasia.    Aspiration Pneumonia Aspiration pneumonia is an infection in your lungs. It occurs when food, liquid, or stomach contents (vomit) are inhaled (aspirated) into your lungs. When these things get into your lungs, swelling (inflammation) and infection can occur. This can make it difficult for you to breathe. Aspiration pneumonia is a serious condition and can be life threatening. RISK FACTORS Aspiration pneumonia is more likely to occur when a person's cough (gag) reflex or ability to swallow has been decreased. Some things that can do this include:   Having a brain injury or disease, such as stroke, seizures, Parkinson disease, dementia, or amyotrophic lateral sclerosis (ALS).   Being given general anesthetic for procedures.   Being in a coma (unconscious).   Having a narrowing of the tube that carries food to the stomach (esophagus).   Drinking too much alcohol. If a person passes out and vomits, vomit can be swallowed into the lungs.   Taking certain medicines, such as tranquilizers or sedatives.  SIGNS AND SYMPTOMS   Coughing after swallowing food or liquids.   Breathing problems, such as wheezing or shortness of breath.   Bluish skin. This can be caused by lack of oxygen.   Coughing up food or mucus. The mucus might contain blood, greenish material, or yellowish-white fluid (pus).   Fever.   Chest pain.   Being more tired than usual (fatigue).   Sweating more than usual.   Bad breath.  DIAGNOSIS  A physical exam  will be done. During the exam, the health care provider will listen to your lungs with a stethoscope to check for:   Crackling sounds in the lungs.  Decreased breath sounds.  A rapid heartbeat. Various tests may be ordered. These may include:   Chest X-ray.   CT scan.   Swallowing study. This test looks at how food is swallowed and whether it goes into your breathing tube (trachea) or food pipe (esophagus).   Sputum culture. Saliva and mucus (sputum) are collected from the lungs or the tubes that carry air to the lungs (bronchi). The sputum is then tested for bacteria.   Bronchoscopy. This test uses a flexible tube (bronchoscope) to see inside the lungs. TREATMENT  Treatment will usually include antibiotic medicines. Other medicines may also be used to reduce fever or pain. You may need to be treated in the hospital. In the hospital, your breathing will be carefully monitored. Depending on how well you are breathing, you may need to be given oxygen, or you may need breathing support from a breathing machine (ventilator). For people who fail a swallowing study, a feeding tube might be placed in the stomach, or they may be asked to avoid certain food textures or liquids when they eat. HOME CARE INSTRUCTIONS   Carefully follow any special eating instructions you were given, such as avoiding certain food textures or thickening liquids. This reduces the risk of developing aspiration pneumonia again.  Only take over-the-counter or prescription medicines as directed by your health care provider. Follow the directions carefully.   If you were prescribed antibiotics, take them as directed. Finish them even if  you start to feel better.   Rest as instructed by your health care provider.   Keep all follow-up appointments with your health care provider.  SEEK MEDICAL CARE IF:   You develop worsening shortness of breath, wheezing, or difficulty breathing.   You develop a fever.    You have chest pain.  MAKE SURE YOU:   Understand these instructions.  Will watch your condition.  Will get help right away if you are not doing well or get worse. Document Released: 10/12/2009 Document Revised: 08/17/2013 Document Reviewed: 06/02/2013 [facility] Patient Information 2014 [place].

## 2014-06-06 NOTE — ED Notes (Signed)
C/o SOB, Pt states she feels like she has pneumonia or food aspirate in lungs. C/o cough that is making chest hurt. States when she eats she feels as if food and liquid is going into lungs.

## 2014-06-06 NOTE — ED Notes (Signed)
Pt c/o "food going down into lungs when eating" for "a long time." Pt sts that she had Pneumonia a few months ago as a result. Pt sts that food gets stuck in her throat and that it makes her vomit. Pt c/o nausea and vomiting but can't report when the last time she vomited was. Pt c/o chest pain when coughing. Pt sts she would like a chest xray.A&Ox4.

## 2014-06-06 NOTE — ED Provider Notes (Signed)
CSN: [id number]     Arrival date & time 06/06/14  1713 History   First MD Initiated Contact with Patient 06/06/14 1733     Chief Complaint  Patient presents with  . Shortness of Breath     (Consider location/radiation/quality/duration/timing/severity/associated sxs/prior Treatment) Patient is a 33 y.o. female presenting with shortness of breath. The history is provided by the patient and medical records. No language interpreter was used.  Shortness of Breath Associated symptoms: chest pain (with coughing), cough and vomiting   Associated symptoms: no abdominal pain, no diaphoresis, no fever, no headaches, no rash and no wheezing     Raven [name] is a 33 y.o. female  with a hx of achalasia with several esophageal stretching procedures, anxiety, GERD, asthma, arthralgia, recurrent pneumonia, hypertension presents to the Emergency Department complaining of gradual, persistent, progressively worsening cough with associated left-sided chest pain and shortness of breath onset 6 days ago. Patient reports increased dysphasia with associated nausea and vomiting for the last several months. She reports she is followed by gastroenterologist for this. She reports increasing symptoms of reflux especially at night. She's concerned that she may have aspirated. Patient reports she had aspiration pneumonia several months ago and this feels similar. Associated symptoms include fever 101 at home. She also endorses chest pain with coughing but not at rest. No dyspnea on exertion.  Nothing makes it better and nothing makes it worse.  Pt denies headache, neck pain, abdominal pain, diarrhea, weakness, dizziness, syncope, dysuria, hematuria..     Past Medical History  Diagnosis Date  . Achalasia     esphog stretched several times  . Abnormal Pap smear   . Depression   . HSV-2 infection   . PVC's (premature ventricular contractions)   . Anxiety   . Blood transfusion 01/2006    [place]  . GERD  (gastroesophageal reflux disease)     no meds - diet controlled  . Asthma     rarely uses inhaler  . Arthritis   . History of abnormal Pap smear   . History of chlamydia 1990's  . Fibromyalgia   . Pneumonia   . Hiatal hernia   . Cardiomyopathy   . Status post dilation of esophageal narrowing   . HTN (hypertension)   . Heart murmur    Past Surgical History  Procedure Laterality Date  . Tonsillectomy    . Adenoidectomy  12/2002  . Esophagogastroduodenoscopy      with esophageal dilation x 3  . Dilation and curettage of uterus  1999  . Breast surgery  07/2001    tumor removed right breast  . Dilation and curettage of uterus  01/2006    retained products  . Bladder suspension  06/11/2012    Procedure: TRANSVAGINAL TAPE (TVT) PROCEDURE;  Surgeon: Delice Lesch, MD;  Location: [facility];  Service: Gynecology;  Laterality: N/A;  Tension Free Vaginal Tape/cystoscopy  . Laparoscopic tubal ligation  06/11/2012    Procedure: LAPAROSCOPIC TUBAL LIGATION;  Surgeon: Delice Lesch, MD;  Location: [facility];  Service: Gynecology;  Laterality: Bilateral;  Laparoscopic Bilateral Fulgeration  . Cystoscopy  06/11/2012    Procedure: CYSTOSCOPY;  Surgeon: Delice Lesch, MD;  Location: [facility];  Service: Gynecology;  Laterality: N/A;  . Foreign body removal  06/11/2012    Procedure: REMOVAL FOREIGN BODY EXTREMITY;  Surgeon: Delice Lesch, MD;  Location: [facility];  Service: Gynecology;  Laterality: Right;  Removal of Implanon    Family History  Problem Relation Age  of Onset  . Heart disease Father   . Hypertension Father   . Stroke Mother   . Hypertension Mother   . Heart attack Mother   . Breast cancer Mother   . Neuropathy Mother   . Arthritis Mother   . Neuropathy Sister   . Arthritis Sister    History  Substance Use Topics  . Smoking status: Current Some Day Smoker -- 0.25 packs/day    Types: Cigarettes  . Smokeless tobacco: Never Used     Comment: HAS CUT BACK  . Alcohol Use: Yes      Comment: on occasion   OB History   Grav Para Term Preterm Abortions TAB SAB Ect Mult Living   4 2 2  0 2  2   2      Review of Systems  Constitutional: Negative for fever, diaphoresis, appetite change, fatigue and unexpected weight change.  HENT: Positive for trouble swallowing. Negative for mouth sores.   Eyes: Negative for visual disturbance.  Respiratory: Positive for cough, chest tightness and shortness of breath. Negative for wheezing.   Cardiovascular: Positive for chest pain (with coughing).  Gastrointestinal: Positive for nausea and vomiting. Negative for abdominal pain, diarrhea and constipation.  Endocrine: Negative for polydipsia, polyphagia and polyuria.  Genitourinary: Negative for dysuria, urgency, frequency and hematuria.  Musculoskeletal: Negative for back pain and neck stiffness.  Skin: Negative for rash.  Allergic/Immunologic: Negative for immunocompromised state.  Neurological: Negative for syncope, light-headedness and headaches.  Hematological: Does not bruise/bleed easily.  Psychiatric/Behavioral: Negative for sleep disturbance. The patient is not nervous/anxious.       Allergies  Review of patient's allergies indicates no known allergies.  Home Medications   Prior to Admission medications   Medication Sig Start Date End Date Taking? Authorizing Provider  beclomethasone (QVAR) 80 MCG/ACT inhaler Inhale 2 puffs into the lungs every 6 (six) hours as needed (for wheezing and shortness of breath). For asthma   Yes Historical Provider, MD  carvedilol (COREG) 6.25 MG tablet Take 1 tablet (6.25 mg total) by mouth 2 (two) times daily. 04/12/14  Yes Deboraha Sprang, MD  clonazePAM (KLONOPIN) 1 MG tablet Take 1 mg by mouth at bedtime as needed. For anxiety   Yes Historical Provider, MD  EPINEPHrine (EPIPEN) 0.3 mg/0.3 mL SOAJ injection Inject 0.3 mg into the muscle once as needed (for allergies).   Yes Historical Provider, MD  HYDROcodone-acetaminophen (NORCO/VICODIN)  5-325 MG per tablet Take 1-2 tablets by mouth every 6 (six) hours as needed for moderate pain or severe pain. 01/22/14  Yes Dossie Arbour Lackey, PA-C  lisinopril (PRINIVIL,ZESTRIL) 2.5 MG tablet Take 1 tablet (2.5 mg total) by mouth daily. 01/09/14  Yes Deboraha Sprang, MD  albuterol (PROVENTIL HFA;VENTOLIN HFA) 108 (90 BASE) MCG/ACT inhaler Inhale 2 puffs into the lungs every 4 (four) hours as needed for wheezing or shortness of breath. 06/06/14   Adrean Findlay, PA-C  amoxicillin-clavulanate (AUGMENTIN) 400-57 MG per chewable tablet Chew 2 tablets by mouth 2 (two) times daily. 06/06/14   Rakia Frayne, PA-C  glycopyrrolate (ROBINUL-FORTE) 2 MG tablet Take 1 tablet (2 mg total) by mouth 3 (three) times daily as needed. 05/10/14   Inda Castle, MD  HYDROcodone-homatropine [facility]) 5-1.5 MG/5ML syrup Take 5 mLs by mouth every 6 (six) hours as needed for cough. 06/06/14   Fred Hammes, PA-C  Hyoscyamine Sulfate 0.375 MG TBCR Take one tab twice a day for 5 days then as needed for abdominal pain 03/27/14   Inda Castle,  MD  Spacer/Aero-Holding Chambers (AEROCHAMBER PLUS WITH MASK) inhaler Use as instructed 06/06/14   Jarrett Soho Sayre Mazor, PA-C   BP 139/70  Pulse 93  Temp(Src) 98.1 F (36.7 C) (Oral)  Resp 18  SpO2 100%  LMP 05/27/2014 Physical Exam  Nursing note and vitals reviewed. Constitutional: She is oriented to person, place, and time. She appears well-developed and well-nourished. No distress.  Awake, alert, nontoxic appearance  HENT:  Head: Normocephalic and atraumatic.  Right Ear: Tympanic membrane, external ear and ear canal normal.  Left Ear: Tympanic membrane, external ear and ear canal normal.  Nose: No mucosal edema or rhinorrhea. No epistaxis. Right sinus exhibits no maxillary sinus tenderness and no frontal sinus tenderness. Left sinus exhibits no maxillary sinus tenderness and no frontal sinus tenderness.  Mouth/Throat: Uvula is midline, oropharynx is clear and moist  and mucous membranes are normal. Mucous membranes are not pale and not cyanotic. No oropharyngeal exudate, posterior oropharyngeal edema, posterior oropharyngeal erythema or tonsillar abscesses.  Eyes: Conjunctivae are normal. Pupils are equal, round, and reactive to light. No scleral icterus.  Neck: Normal range of motion and full passive range of motion without pain. Neck supple.  Cardiovascular: Normal rate, regular rhythm, normal heart sounds and intact distal pulses.   No murmur heard. Pulmonary/Chest: Effort normal. No accessory muscle usage or stridor. Not tachypneic. No respiratory distress. She has no decreased breath sounds. She has wheezes in the left upper field and the left middle field. She has no rhonchi. She has no rales. She exhibits tenderness.  Patient with congested cough Focal wheezes heard to the left middle and left upper field; no rhonchi or rales Mild tenderness palpation of the anterior chest and bilateral ribs  Abdominal: Soft. Bowel sounds are normal. She exhibits no distension and no mass. There is no tenderness. There is no rebound and no guarding.  Musculoskeletal: Normal range of motion. She exhibits no edema.  Lymphadenopathy:    She has no cervical adenopathy.  Neurological: She is alert and oriented to person, place, and time. She exhibits normal muscle tone. Coordination normal.  Speech is clear and goal oriented Moves extremities without ataxia  Skin: Skin is warm and dry. No rash noted. She is not diaphoretic. No erythema.  Psychiatric: She has a normal mood and affect.    ED Course  Procedures (including critical care time) Labs Review Labs Reviewed - No data to display  Imaging Review Dg Chest 2 View  06/06/2014   CLINICAL DATA:  Cough, smoker  EXAM: CHEST  2 VIEW  COMPARISON:  02/06/2014  FINDINGS: The heart size and mediastinal contours are within normal limits. Both lungs are clear. The visualized skeletal structures are unremarkable. Similar mild  central bronchitic change. No new process.  IMPRESSION: No active cardiopulmonary disease.   Electronically Signed   By: Daryll Brod M.D.   On: 06/06/2014 18:34     EKG Interpretation None      MDM   Final diagnoses:  Aspiration pneumonia  Cough  Chest wall pain   Raven [name] presents with history of aspiration pneumonia and increasing dysphasia secondary to her achalasia. Patient is followed by GI for this.  Focal wheezes left middle and upper lobes.  Chest x-ray without evidence of pneumonia for patient's symptoms consistent with same. Will ambulate and give albuterol and reassess.  8:24 PM Patient with improved her volume after albuterol.  Patient without persistent wheezes in the left but now focal rhonchi in the right lower.  We'll continue to  treat for aspiration pneumonia.  He isn't tolerating by mouth here in the emergency department without choking or aspiration.  Her oxygen saturation is maintained 100% while ambualting. She is afebrile and not tachycardic.  Will treat her symptoms with albuterol, Hycodan and ibuprofen. Patient given antibiotics for her aspiration pneumonia. She is to followup with her GI specialist, primary care and pulmonology.  I have personally reviewed patient's vitals, nursing note and any pertinent labs or imaging.  I performed an undressed physical exam.    At this time, it has been determined that no acute conditions requiring further emergency intervention. The patient/guardian have been advised of the diagnosis and plan. I reviewed all labs and imaging including any potential incidental findings. We have discussed signs and symptoms that warrant return to the ED, such as increased difficult breathing, high fevers or other concerning symptoms.  Patient/guardian has voiced understanding and agreed to follow-up with the PCP or specialist in 2 days.  Vital signs are stable at discharge.   BP 139/70  Pulse 93  Temp(Src) 98.1 F (36.7 C)  (Oral)  Resp 18  SpO2 100%  LMP 05/27/2014        Abigail Butts, PA-C 06/06/14 2026

## 2014-06-06 NOTE — ED Notes (Signed)
Patient transported to X-ray 

## 2014-06-11 NOTE — ED Provider Notes (Signed)
Medical screening examination/treatment/procedure(s) were performed by non-physician practitioner and as supervising physician I was immediately available for consultation/collaboration.   EKG Interpretation None       Virgel Manifold, MD 06/11/14 [phone]

## 2014-08-08 ENCOUNTER — Ambulatory Visit (INDEPENDENT_AMBULATORY_CARE_PROVIDER_SITE_OTHER)
Admission: RE | Admit: 2014-08-08 | Discharge: 2014-08-08 | Disposition: A | Discharge status: Home / Self Care | Payer: Medicaid Other | Source: Ambulatory Visit | Attending: Internal Medicine | Admitting: Internal Medicine

## 2014-08-08 ENCOUNTER — Other Ambulatory Visit (INDEPENDENT_AMBULATORY_CARE_PROVIDER_SITE_OTHER): Payer: Medicaid Other

## 2014-08-08 ENCOUNTER — Ambulatory Visit (INDEPENDENT_AMBULATORY_CARE_PROVIDER_SITE_OTHER): Payer: Medicaid Other | Admitting: Internal Medicine

## 2014-08-08 ENCOUNTER — Encounter: Payer: Self-pay | Admitting: Internal Medicine

## 2014-08-08 VITALS — BP 100/70 | HR 67 | Temp 98.2°F | Ht 69.0 in | Wt 197.0 lb

## 2014-08-08 DIAGNOSIS — R0600 Dyspnea, unspecified: Secondary | ICD-10-CM

## 2014-08-08 DIAGNOSIS — R0609 Other forms of dyspnea: Secondary | ICD-10-CM

## 2014-08-08 DIAGNOSIS — F17200 Nicotine dependence, unspecified, uncomplicated: Secondary | ICD-10-CM

## 2014-08-08 DIAGNOSIS — R0989 Other specified symptoms and signs involving the circulatory and respiratory systems: Secondary | ICD-10-CM

## 2014-08-08 HISTORY — DX: Dyspnea, unspecified: R06.00

## 2014-08-08 LAB — CBC WITH DIFFERENTIAL/PLATELET
Basophils Absolute: 0.1 10*3/uL (ref 0.0–0.1)
Basophils Relative: 0.7 % (ref 0.0–3.0)
EOS PCT: 0.7 % (ref 0.0–5.0)
Eosinophils Absolute: 0.1 10*3/uL (ref 0.0–0.7)
HCT: 39.9 % (ref 36.0–46.0)
Hemoglobin: 13.4 g/dL (ref 12.0–15.0)
Lymphocytes Relative: 30.8 % (ref 12.0–46.0)
Lymphs Abs: 3 10*3/uL (ref 0.7–4.0)
MCHC: 33.7 g/dL (ref 30.0–36.0)
MCV: 91.8 fl (ref 78.0–100.0)
MONOS PCT: 6.7 % (ref 3.0–12.0)
Monocytes Absolute: 0.7 10*3/uL (ref 0.1–1.0)
NEUTROS PCT: 61.1 % (ref 43.0–77.0)
Neutro Abs: 6 10*3/uL (ref 1.4–7.7)
PLATELETS: 202 10*3/uL (ref 150.0–400.0)
RBC: 4.34 Mil/uL (ref 3.87–5.11)
RDW: 13.7 % (ref 11.5–15.5)
WBC: 9.8 10*3/uL (ref 4.0–10.5)

## 2014-08-08 LAB — BASIC METABOLIC PANEL
BUN: 7 mg/dL (ref 6–23)
CALCIUM: 9.3 mg/dL (ref 8.4–10.5)
CHLORIDE: 107 meq/L (ref 96–112)
CO2: 22 mEq/L (ref 19–32)
Creatinine, Ser: 0.8 mg/dL (ref 0.4–1.2)
GFR: 102.01 mL/min (ref >60.00)
Glucose, Bld: 95 mg/dL (ref 70–99)
Potassium: 4.1 mEq/L (ref 3.5–5.1)
Sodium: 139 mEq/L (ref 135–145)

## 2014-08-08 LAB — BRAIN NATRIURETIC PEPTIDE: Pro B Natriuretic peptide (BNP): 20 pg/mL (ref 0.0–100.0)

## 2014-08-08 LAB — TSH: TSH: 1.24 u[IU]/mL (ref 0.35–4.50)

## 2014-08-08 MED ORDER — FAMOTIDINE 20 MG PO TABS: ORAL_TABLET | ORAL | Status: DC

## 2014-08-08 MED ORDER — PANTOPRAZOLE SODIUM 40 MG PO TBEC: 40.0000 mg | DELAYED_RELEASE_TABLET | Freq: Every day | ORAL | Status: DC

## 2014-08-08 NOTE — Patient Instructions (Addendum)
Pantoprazole 20 mg Take 30-60 min before first meal of the day and pepcid 20 mg at bedtime   GERD (REFLUX)  is an extremely common cause of respiratory symptoms, many times with no significant heartburn at all.    It can be treated with medication, but also with lifestyle changes including avoidance of late meals, excessive alcohol, smoking cessation, and avoid fatty foods, chocolate, peppermint, colas, red wine, and acidic juices such as orange juice.  NO MINT OR MENTHOL PRODUCTS SO NO COUGH DROPS  USE SUGARLESS CANDY INSTEAD (jolley ranchers or Stover's)  NO OIL BASED VITAMINS - use powdered substitutes.  Please remember to go to the  Lab and x-ray department downstairs for your tests - we will call you with the results when they are available.  Please schedule a follow up office visit in 4 weeks, sooner if needed with all medications in hand from all doctors in hand and I will send Dr Caryl Comes and Ayesha Rumpf a copy of your evaluation today

## 2014-08-08 NOTE — Progress Notes (Signed)
Subjective:    Patient ID: Raven [name], female    DOB: 08/01/1980   MRN: [id number]  HPI  41 yobf active smoker never really healthy as child attributed to palpitations / allergies then started smoking age 33 by age 54 started having breathing problems referred to pulmonary clinic by ER 08/08/2014    08/08/2014 1st [name]  Chief Complaint  Patient presents with  . Pulmonary Consult    Self referral. Pt states that she has been having problems with aspirating food since tonsillectomy in 2002. She has been dxed with aspiration PNA x 2 already in 2015.  She c/o cough and SOB. Cough is esp worse after eating and at night. She states that she wakes up gasping for air.   not happy x 4-5 y daily difficulty with swallowing and when lie down immediately gasping for air  On acei chronically but denies taking it - says meds listed never correspond to what she actually takes Has used saba, helps very little if at all  No better on qvar so stopped it   No obvious other patterns in day to day or daytime variabilty or assoc excess or putulent sputum or clasically pleuritic or ex cp or chest tightness, subjective wheeze . No unusual exp hx or h/o childhood pna/ asthma (just upper resp "allergies) or knowledge of premature birth.  Sleeping ok without nocturnal  or early am exacerbation  of respiratory  c/o's or need for noct saba. Also denies any obvious fluctuation of symptoms with weather or environmental changes or other aggravating or alleviating factors except as outlined above   Current Medications, Allergies, Complete Past Medical History, Past Surgical History, Family History, and Social History were reviewed in [employer] record.             Review of Systems  Constitutional: Positive for appetite change. Negative for fever, chills and unexpected weight change.  HENT: Positive for congestion, sneezing and trouble swallowing.  Negative for dental problem, ear pain, nosebleeds, postnasal drip, rhinorrhea, sinus pressure, sore throat and voice change.   Eyes: Negative for visual disturbance.  Respiratory: Positive for cough and shortness of breath. Negative for choking.   Cardiovascular: Positive for chest pain. Negative for leg swelling.  Gastrointestinal: Negative for vomiting, abdominal pain and diarrhea.  Genitourinary: Negative for difficulty urinating.       Heartburn  Indigestion  Musculoskeletal: Positive for arthralgias.  Skin: Negative for rash.  Neurological: Negative for tremors, syncope and headaches.  Hematological: Does not bruise/bleed easily.       Objective:   Physical Exam  Wt Readings from Last 3 Encounters:  08/08/14 197 lb (89.359 kg)  05/10/14 195 lb (88.451 kg)  03/27/14 195 lb 4 oz (88.565 kg)      amb bf with classic marked voice fatigue and some pseudowheeze  HEENT: nl dentition, turbinates, and orophanx. Nl external ear canals without cough reflex   NECK :  without JVD/Nodes/TM/ nl carotid upstrokes bilaterally   LUNGS: no acc muscle use, clear to A and P bilaterally without cough on insp or exp maneuvers   CV:  RRR  no s3 or murmur or increase in P2, no edema   ABD:  soft and nontender with nl excursion in the supine position. No bruits or organomegaly, bowel sounds nl  MS:  warm without deformities, calf tenderness, cyanosis or clubbing  SKIN: warm and dry without lesions    NEURO:  alert, approp, no deficits  CXR  08/08/2014 :  Stable cardiac and mediastinal contours. No consolidative pulmonary  opacities. No pleural effusion or pneumothorax. Regional skeleton is  unremarkable.    Recent Labs Lab 08/08/14 1237  NA 139  K 4.1  CL 107  CO2 22  BUN 7  CREATININE 0.8  GLUCOSE 95    Recent Labs Lab 08/08/14 1237  HGB 13.4  HCT 39.9  WBC 9.8  PLT 202.0      Lab Results  Component Value Date   TSH 1.24 08/08/2014    Lab Results  Component  Value Date   PROBNP 20.0 08/08/2014           Assessment & Plan:

## 2014-08-09 ENCOUNTER — Telehealth: Payer: Self-pay | Admitting: *Deleted

## 2014-08-09 DIAGNOSIS — F17210 Nicotine dependence, cigarettes, uncomplicated: Secondary | ICD-10-CM | POA: Insufficient documentation

## 2014-08-09 HISTORY — DX: Nicotine dependence, cigarettes, uncomplicated: F17.210

## 2014-08-09 NOTE — Telephone Encounter (Signed)
LMTCB

## 2014-08-09 NOTE — Assessment & Plan Note (Addendum)
-   08/08/2014  Walked RA x 3 laps @ 185 ft each stopped due to  End of study, no sob or desat - Spirometry 08/08/14 wnl   Symptoms are markedly disproportionate to objective findings and not clear this is a lung problem but pt does appear to have difficult airway management issues. DDX of  difficult airways management all start with A and  include Adherence, Ace Inhibitors, Acid Reflux, Active Sinus Disease, Alpha 1 Antitripsin deficiency, Anxiety masquerading as Airways dz,  ABPA,  allergy(esp in young), Aspiration (esp in elderly), Adverse effects of DPI,  Active smokers, plus two Bs  = Bronchiectasis and Beta blocker use..and one C= CHF  Adherence is always the initial "prime suspect" and is a multilayered concern that requires a "trust but verify" approach in every patient - starting with knowing how to use medications, especially inhalers, correctly, keeping up with refills and understanding the fundamental difference between maintenance and prns vs those medications only taken for a very short course and then stopped and not refilled.  - I have insisted from now on that if she want our help or for that matter the help of any health care provider she provide an updated accurate list off all meds and for my purposes also will need to bring the actual meds in using a "trust but verify" approach  ? chf > no evidence at all for this, ok to leave off acei  ?Acei > insists now she isn't taking the med anyway  ? Active smoking > nl pfts so as bad as this is, doubt it explains her persistent symptoms  ? Acid (or non-acid) GERD > always difficult to exclude as up to 75% of pts in some series report no assoc GI/ Heartburn symptoms> rec max (24h)  acid suppression and diet restrictions/ reviewed and instructions given in writing.   ? Anxiety > usually a dx of exclusion but much higher here   Plan to regroup in 4 weeks   Will regroup in 4 weeks

## 2014-08-09 NOTE — Assessment & Plan Note (Signed)

## 2014-08-09 NOTE — Telephone Encounter (Signed)
Spoke with the pt and notified of recs per MW  She verbalized understanding  Nothing further needed 

## 2014-08-09 NOTE — Telephone Encounter (Signed)
Message copied by Rosana Berger on Wed Aug 09, 2014  9:17 AM ------      Message from: Christinia Gully B      Created: Wed Aug 09, 2014  9:01 AM       "omeprazole" on instructions should have been "pantoprazole" ------

## 2014-09-05 ENCOUNTER — Ambulatory Visit (INDEPENDENT_AMBULATORY_CARE_PROVIDER_SITE_OTHER): Payer: Medicaid Other | Admitting: Internal Medicine

## 2014-09-05 ENCOUNTER — Encounter: Payer: Self-pay | Admitting: Internal Medicine

## 2014-09-05 VITALS — BP 130/80 | HR 77 | Temp 97.8°F | Ht 69.0 in | Wt 196.0 lb

## 2014-09-05 DIAGNOSIS — J45909 Unspecified asthma, uncomplicated: Secondary | ICD-10-CM

## 2014-09-05 DIAGNOSIS — R059 Cough, unspecified: Secondary | ICD-10-CM

## 2014-09-05 DIAGNOSIS — R058 Other specified cough: Secondary | ICD-10-CM | POA: Insufficient documentation

## 2014-09-05 DIAGNOSIS — R05 Cough: Secondary | ICD-10-CM

## 2014-09-05 HISTORY — DX: Other specified cough: R05.8

## 2014-09-05 MED ORDER — METHYLPREDNISOLONE ACETATE 80 MG/ML IJ SUSP
120.0000 mg | Freq: Once | INTRAMUSCULAR | Status: AC
  Administered 2014-09-05: 120 mg via INTRAMUSCULAR

## 2014-09-05 MED ORDER — FAMOTIDINE 20 MG PO TABS: ORAL_TABLET | ORAL | Status: DC

## 2014-09-05 MED ORDER — FLUTICASONE FUROATE-VILANTEROL 100-25 MCG/INH IN AEPB: 1.0000 | INHALATION_SPRAY | Freq: Every morning | RESPIRATORY_TRACT | Status: DC

## 2014-09-05 NOTE — Patient Instructions (Addendum)
Take the pepcid 20 mg after bfast and after supper  Please see patient coordinator before you leave today  to schedule ENT eval by [facility]  Depomedrol 120 mg today   The key is to stop smoking completely before smoking completely stops you!   Pulmonary follow up is as needed

## 2014-09-05 NOTE — Progress Notes (Signed)
Subjective:    Patient ID: Raven [name], female    DOB: 08/01/1980   MRN: [id number]    Brief patient profile:  71 yobf active smoker never really healthy as child attributed to palpitations / allergies then started smoking age 33 by age 18 started having breathing problems referred to [name] by ER 08/08/2014    08/08/2014 1st Raven [name] office visit/ Raven [name]  Chief Complaint  Patient presents with  . [name] Consult    Self referral. Pt states that she has been having problems with aspirating food since tonsillectomy in 2002. She has been dxed with aspiration PNA x 2 already in 2015.  She c/o cough and SOB. Cough is esp worse after eating and at night. She states that she wakes up gasping for air.   not happy x 4-5 y daily difficulty with swallowing and when lie down immediately gasping for air  On acei chronically but denies taking it - says meds listed never correspond to what she actually was taking Has used saba, helps very little if at all  No better on qvar so stopped it  rec Pantoprazole 20 mg Take 30-60 min before first meal of the day and pepcid 20 mg at bedtime  GERD diet     09/05/2014 f/u ov/Raven [name] re: no meds on hand  Chief Complaint  Patient presents with  . Follow-up    Pt states she has not been coughing as much as when last here. No new co's today. She is using albuterol approx 3 times per wk.     Stopped pantoprazole due to abd cramping, taking pepcid at hs with less coughing then Not limited by breathing from desired activities   Mostly concerned with hoarsness/ already eval by [facility] and not willing to return - convinced she has lump in throat.   No obvious day to day or daytime variabilty or assoc  cp or chest tightness, subjective wheeze overt sinus or hb symptoms. No unusual exp hx or h/o childhood pna/ asthma or knowledge of premature birth.  Sleeping ok without nocturnal  or early am exacerbation  of respiratory  c/o's or need for  noct saba. Also denies any obvious fluctuation of symptoms with weather or environmental changes or other aggravating or alleviating factors except as outlined above   Current Medications, Allergies, Complete Past Medical History, Past Surgical History, Family History, and Social History were reviewed in [employer] record.  ROS  The following are not active complaints unless bolded sore throat, dysphagia, dental problems, itching, sneezing,  nasal congestion or excess/ purulent secretions, ear ache,   fever, chills, sweats, unintended wt loss, pleuritic or exertional cp, hemoptysis,  orthopnea pnd or leg swelling, presyncope, palpitations, heartburn, abdominal pain, anorexia, nausea, vomiting, diarrhea  or change in bowel or urinary habits, change in stools or urine, dysuria,hematuria,  rash, arthralgias, visual complaints, headache, numbness weakness or ataxia or problems with walking or coordination,  change in mood/affect or memory.                        Objective:   Physical Exam   09/05/2014          196  Wt Readings from Last 3 Encounters:  08/08/14 197 lb (89.359 kg)  05/10/14 195 lb (88.451 kg)  03/27/14 195 lb 4 oz (88.565 kg)      amb bf with  mild voice fatigue and min pseudowheeze  HEENT: nl dentition, turbinates, and  orophanx. Nl external ear canals without cough reflex   NECK :  without JVD/Nodes/TM/ nl carotid upstrokes bilaterally   LUNGS: no acc muscle use, clear to A and P bilaterally without cough on insp or exp maneuvers   CV:  RRR  no s3 or murmur or increase in P2, no edema   ABD:  soft and nontender with nl excursion in the supine position. No bruits or organomegaly, bowel sounds nl  MS:  warm without deformities, calf tenderness, cyanosis or clubbing  SKIN: warm and dry without lesions    NEURO:  alert, approp, no deficits    CXR  08/08/2014 :  Stable cardiac and mediastinal contours. No consolidative [name]   opacities. No pleural effusion or pneumothorax. Regional skeleton is  unremarkable.    Recent Labs Lab 08/08/14 1237  NA 139  K 4.1  CL 107  CO2 22  BUN 7  CREATININE 0.8  GLUCOSE 95    Recent Labs Lab 08/08/14 1237  HGB 13.4  HCT 39.9  WBC 9.8  PLT 202.0      Lab Results  Component Value Date   TSH 1.24 08/08/2014    Lab Results  Component Value Date   PROBNP 20.0 08/08/2014           Assessment & Plan:

## 2014-09-06 NOTE — Assessment & Plan Note (Addendum)
-  Spirometry wnl  08/08/14 with pseudowheeze on exam   I had an extended discussion with the patient today lasting 15 to 20 minutes of a 25 minute visit on the following issues:   Not able to use PPI at  This point and not convinced this is related to GERD but already established with [facility] so best option is have her seen also by ENT there to work out a multidisciplinary approach here.   See instructions for specific recommendations which were reviewed directly with the patient who was given a copy with highlighter outlining the key components.

## 2014-09-06 NOTE — Assessment & Plan Note (Addendum)
Spirometry wnl  08/08/14 with symptoms    Unclear how much asthma she really has and no need for f/u here unless symptoms worsen in absence of worse vcd or saba use goes up over present acceptable baseline

## 2014-09-14 ENCOUNTER — Emergency Department (HOSPITAL_COMMUNITY): Payer: Medicaid Other

## 2014-09-14 ENCOUNTER — Emergency Department (HOSPITAL_COMMUNITY)
Admission: EM | Admit: 2014-09-14 | Discharge: 2014-09-14 | Disposition: A | Discharge status: Home / Self Care | Payer: Medicaid Other | Attending: Emergency Medicine | Admitting: Emergency Medicine

## 2014-09-14 ENCOUNTER — Encounter (HOSPITAL_COMMUNITY): Payer: Self-pay | Admitting: Emergency Medicine

## 2014-09-14 DIAGNOSIS — Y9289 Other specified places as the place of occurrence of the external cause: Secondary | ICD-10-CM | POA: Insufficient documentation

## 2014-09-14 DIAGNOSIS — Z8701 Personal history of pneumonia (recurrent): Secondary | ICD-10-CM | POA: Diagnosis not present

## 2014-09-14 DIAGNOSIS — Z8739 Personal history of other diseases of the musculoskeletal system and connective tissue: Secondary | ICD-10-CM | POA: Diagnosis not present

## 2014-09-14 DIAGNOSIS — Z79899 Other long term (current) drug therapy: Secondary | ICD-10-CM | POA: Insufficient documentation

## 2014-09-14 DIAGNOSIS — J45909 Unspecified asthma, uncomplicated: Secondary | ICD-10-CM | POA: Diagnosis not present

## 2014-09-14 DIAGNOSIS — F17200 Nicotine dependence, unspecified, uncomplicated: Secondary | ICD-10-CM | POA: Insufficient documentation

## 2014-09-14 DIAGNOSIS — K219 Gastro-esophageal reflux disease without esophagitis: Secondary | ICD-10-CM | POA: Diagnosis not present

## 2014-09-14 DIAGNOSIS — R011 Cardiac murmur, unspecified: Secondary | ICD-10-CM | POA: Diagnosis not present

## 2014-09-14 DIAGNOSIS — S61209A Unspecified open wound of unspecified finger without damage to nail, initial encounter: Secondary | ICD-10-CM | POA: Insufficient documentation

## 2014-09-14 DIAGNOSIS — W268XXA Contact with other sharp object(s), not elsewhere classified, initial encounter: Secondary | ICD-10-CM | POA: Insufficient documentation

## 2014-09-14 DIAGNOSIS — F411 Generalized anxiety disorder: Secondary | ICD-10-CM | POA: Diagnosis not present

## 2014-09-14 DIAGNOSIS — Y93G1 Activity, food preparation and clean up: Secondary | ICD-10-CM | POA: Diagnosis not present

## 2014-09-14 DIAGNOSIS — Z8619 Personal history of other infectious and parasitic diseases: Secondary | ICD-10-CM | POA: Insufficient documentation

## 2014-09-14 DIAGNOSIS — F3289 Other specified depressive episodes: Secondary | ICD-10-CM | POA: Insufficient documentation

## 2014-09-14 DIAGNOSIS — S61219A Laceration without foreign body of unspecified finger without damage to nail, initial encounter: Secondary | ICD-10-CM

## 2014-09-14 DIAGNOSIS — I10 Essential (primary) hypertension: Secondary | ICD-10-CM | POA: Insufficient documentation

## 2014-09-14 DIAGNOSIS — F329 Major depressive disorder, single episode, unspecified: Secondary | ICD-10-CM | POA: Insufficient documentation

## 2014-09-14 MED ORDER — TRAMADOL HCL 50 MG PO TABS: 50.0000 mg | ORAL_TABLET | Freq: Four times a day (QID) | ORAL | Status: DC | PRN

## 2014-09-14 MED ORDER — OXYCODONE-ACETAMINOPHEN 5-325 MG PO TABS
2.0000 | ORAL_TABLET | Freq: Once | ORAL | Status: AC
  Administered 2014-09-14: 2 via ORAL
  Filled 2014-09-14: qty 2

## 2014-09-14 MED ORDER — OXYCODONE-ACETAMINOPHEN 5-325 MG PO TABS: 1.0000 | ORAL_TABLET | Freq: Four times a day (QID) | ORAL | Status: DC | PRN

## 2014-09-14 NOTE — Discharge Instructions (Signed)
Wear a finger splint for comfort and to ensure proper healing. Follow up with your primary care doctor as needed.  Laceration Care, Adult A laceration is a cut or lesion that goes through all layers of the skin and into the tissue just beneath the skin. TREATMENT  Some lacerations may not require closure. Some lacerations may not be able to be closed due to an increased risk of infection. It is important to see your caregiver as soon as possible after an injury to minimize the risk of infection and maximize the opportunity for successful closure. If closure is appropriate, pain medicines may be given, if needed. The wound will be cleaned to help prevent infection. Your caregiver will use stitches (sutures), staples, wound glue (adhesive), or skin adhesive strips to repair the laceration. These tools bring the skin edges together to allow for faster healing and a better cosmetic outcome. However, all wounds will heal with a scar. Once the wound has healed, scarring can be minimized by covering the wound with sunscreen during the day for 1 full year. HOME CARE INSTRUCTIONS  For sutures or staples:  Keep the wound clean and dry.  If you were given a bandage (dressing), you should change it at least once a day. Also, change the dressing if it becomes wet or dirty, or as directed by your caregiver.  Wash the wound with soap and water 2 times a day. Rinse the wound off with water to remove all soap. Pat the wound dry with a clean towel.  After cleaning, apply a thin layer of the antibiotic ointment as recommended by your caregiver. This will help prevent infection and keep the dressing from sticking.  You may shower as usual after the first 24 hours. Do not soak the wound in water until the sutures are removed.  Only take over-the-counter or prescription medicines for pain, discomfort, or fever as directed by your caregiver.  Get your sutures or staples removed as directed by your caregiver. For  skin adhesive strips:  Keep the wound clean and dry.  Do not get the skin adhesive strips wet. You may bathe carefully, using caution to keep the wound dry.  If the wound gets wet, pat it dry with a clean towel.  Skin adhesive strips will fall off on their own. You may trim the strips as the wound heals. Do not remove skin adhesive strips that are still stuck to the wound. They will fall off in time. For wound adhesive:  You may briefly wet your wound in the shower or bath. Do not soak or scrub the wound. Do not swim. Avoid periods of heavy perspiration until the skin adhesive has fallen off on its own. After showering or bathing, gently pat the wound dry with a clean towel.  Do not apply liquid medicine, cream medicine, or ointment medicine to your wound while the skin adhesive is in place. This may loosen the film before your wound is healed.  If a dressing is placed over the wound, be careful not to apply tape directly over the skin adhesive. This may cause the adhesive to be pulled off before the wound is healed.  Avoid prolonged exposure to sunlight or tanning lamps while the skin adhesive is in place. Exposure to ultraviolet light in the first year will darken the scar.  The skin adhesive will usually remain in place for 5 to 10 days, then naturally fall off the skin. Do not pick at the adhesive film. You may need a  tetanus shot if:  You cannot remember when you had your last tetanus shot.  You have never had a tetanus shot. If you get a tetanus shot, your arm may swell, get red, and feel warm to the touch. This is common and not a problem. If you need a tetanus shot and you choose not to have one, there is a rare chance of getting tetanus. Sickness from tetanus can be serious. SEEK MEDICAL CARE IF:   You have redness, swelling, or increasing pain in the wound.  You see a red line that goes away from the wound.  You have yellowish-white fluid (pus) coming from the wound.  You  have a fever.  You notice a bad smell coming from the wound or dressing.  Your wound breaks open before or after sutures have been removed.  You notice something coming out of the wound such as wood or glass.  Your wound is on your hand or foot and you cannot move a finger or toe. SEEK IMMEDIATE MEDICAL CARE IF:   Your pain is not controlled with prescribed medicine.  You have severe swelling around the wound causing pain and numbness or a change in color in your arm, hand, leg, or foot.  Your wound splits open and starts bleeding.  You have worsening numbness, weakness, or loss of function of any joint around or beyond the wound.  You develop painful lumps near the wound or on the skin anywhere on your body. MAKE SURE YOU:   Understand these instructions.  Will watch your condition.  Will get help right away if you are not doing well or get worse. Document Released: 12/15/2005 Document Revised: 03/08/2012 Document Reviewed: 06/10/2011 [facility] Patient Information 2015 [place], [place]. This information is not intended to replace advice given to you by your health care provider. Make sure you discuss any questions you have with your health care provider.

## 2014-09-14 NOTE — ED Provider Notes (Signed)
CSN: [id number]     Arrival date & time 09/14/14  1858 History   First MD Initiated Contact with Patient 09/14/14 2021     Chief Complaint  Patient presents with  . Extremity Laceration     (Consider location/radiation/quality/duration/timing/severity/associated sxs/prior Treatment) HPI Comments: 33 year old female presents to the emergency department for evaluation of right hand laceration. Patient states that she was washing dishes when a glass broke in her hand. Patient states that laceration occurred 1 hour prior to arrival. Laceration noted to palmar aspect of R thumb at DIP joint as well as a superficial laceration to lateral aspect of 2nd digit on palmar aspect between PIP and DIP joint. Bleeding controlled at both sites. She states that she is experiencing subjective numbness in her right thumb. Patient denies complete loss of sensation as well as weakness. She states her last tetanus was one year ago. She denies the use of blood thinners.  The history is provided by the patient. No language interpreter was used.    Past Medical History  Diagnosis Date  . Achalasia     esphog stretched several times  . Abnormal Pap smear   . Depression   . HSV-2 infection   . PVC's (premature ventricular contractions)   . Anxiety   . Blood transfusion 01/2006    [place]  . GERD (gastroesophageal reflux disease)     no meds - diet controlled  . Asthma     rarely uses inhaler  . Arthritis   . History of abnormal Pap smear   . History of chlamydia 1990's  . Fibromyalgia   . Pneumonia   . Hiatal hernia   . Cardiomyopathy   . Status post dilation of esophageal narrowing   . HTN (hypertension)   . Heart murmur    Past Surgical History  Procedure Laterality Date  . Tonsillectomy    . Adenoidectomy  12/2002  . Esophagogastroduodenoscopy      with esophageal dilation x 3  . Dilation and curettage of uterus  1999  . Breast surgery  07/2001    tumor removed right breast  . Dilation and  curettage of uterus  01/2006    retained products  . Bladder suspension  06/11/2012    Procedure: TRANSVAGINAL TAPE (TVT) PROCEDURE;  Surgeon: Delice Lesch, MD;  Location: [facility];  Service: Gynecology;  Laterality: N/A;  Tension Free Vaginal Tape/cystoscopy  . Laparoscopic tubal ligation  06/11/2012    Procedure: LAPAROSCOPIC TUBAL LIGATION;  Surgeon: Delice Lesch, MD;  Location: [facility];  Service: Gynecology;  Laterality: Bilateral;  Laparoscopic Bilateral Fulgeration  . Cystoscopy  06/11/2012    Procedure: CYSTOSCOPY;  Surgeon: Delice Lesch, MD;  Location: [facility];  Service: Gynecology;  Laterality: N/A;  . Foreign body removal  06/11/2012    Procedure: REMOVAL FOREIGN BODY EXTREMITY;  Surgeon: Delice Lesch, MD;  Location: [facility];  Service: Gynecology;  Laterality: Right;  Removal of Implanon    Family History  Problem Relation Age of Onset  . Heart disease Father   . Hypertension Father   . Stroke Mother   . Hypertension Mother   . Heart attack Mother   . Breast cancer Mother   . Neuropathy Mother   . Arthritis Mother   . Neuropathy Sister   . Arthritis Sister   . Asthma Mother   . Asthma Sister    History  Substance Use Topics  . Smoking status: Current Some Day Smoker -- 0.25 packs/day for 15 years  Types: Cigarettes  . Smokeless tobacco: Never Used     Comment: HAS CUT BACK  . Alcohol Use: No     Comment: on occasion   OB History   Grav Para Term Preterm Abortions TAB SAB Ect Mult Living   4 2 2  0 2  2   2       Review of Systems  Musculoskeletal: Positive for myalgias.  Skin: Positive for wound.  Neurological: Positive for numbness (subjective). Negative for weakness.  All other systems reviewed and are negative.   Allergies  Review of patient's allergies indicates no known allergies.  Home Medications   Prior to Admission medications   Medication Sig Start Date End Date Taking? Authorizing Provider  albuterol (PROAIR HFA) 108 (90 BASE) MCG/ACT  inhaler Inhale 2 puffs into the lungs every 6 (six) hours as needed for wheezing or shortness of breath.    Historical Provider, MD  clonazePAM (KLONOPIN) 1 MG tablet Take 1 mg by mouth at bedtime as needed. For anxiety    Historical Provider, MD  EPINEPHrine (EPIPEN) 0.3 mg/0.3 mL SOAJ injection Inject 0.3 mg into the muscle once as needed (for allergies).    Historical Provider, MD  famotidine (PEPCID) 20 MG tablet One after bfast and one after supper 09/05/14   Tanda Rockers, MD  traMADol (ULTRAM) 50 MG tablet Take 1 tablet (50 mg total) by mouth every 6 (six) hours as needed. 09/14/14   Antonietta Breach, PA-C   BP 126/88  Pulse 115  Temp(Src) 98.2 F (36.8 C) (Oral)  Resp 18  SpO2 97%  LMP 09/11/2014  Physical Exam  Nursing note and vitals reviewed. Constitutional: She is oriented to person, place, and time. She appears well-developed and well-nourished. No distress.  Nontoxic/nonseptic appearing  HENT:  Head: Normocephalic and atraumatic.  Eyes: Conjunctivae and EOM are normal. No scleral icterus.  Neck: Normal range of motion.  Cardiovascular: Normal rate, regular rhythm and intact distal pulses.   Distal radial pulse 2+ in RUE. Capillary refill brisk in all digits of R hand.  Pulmonary/Chest: Effort normal. No respiratory distress.  Musculoskeletal: Normal range of motion.  0.5cm uperficial laceration to lateral aspect of 2nd digit on palmar aspect between PIP and DIP joint; bleeding controlled. 1cm, also fairly superficial, laceration to palmar crease of DIP joint of R thumb. Bleeding controlled. Normal ROM of all digits of R hand.  Neurological: She is alert and oriented to person, place, and time. She exhibits normal muscle tone. Coordination normal.  Sensation to light touch intact in all digits. Patient able to wiggle all fingers.  Skin: Skin is warm and dry. No rash noted. She is not diaphoretic. No erythema. No pallor.  Psychiatric: She has a normal mood and affect. Her  behavior is normal.    ED Course  Procedures (including critical care time) Labs Review Labs Reviewed - No data to display  Imaging Review Dg Finger Index Right  09/14/2014   CLINICAL DATA:  Laceration of the index finger and thumb with a wine glass.  EXAM: RIGHT INDEX FINGER 2+V  COMPARISON:  None.  FINDINGS: There is no evidence of fracture or dislocation. There is no evidence of arthropathy or other focal bone abnormality. Soft tissues are unremarkable.  IMPRESSION: Negative.   Electronically Signed   By: Shon Hale M.D.   On: 09/14/2014 22:28   Dg Finger Thumb Right  09/14/2014   CLINICAL DATA:  Laceration of the index finger and thumb with a wine glass.  EXAM:  RIGHT THUMB 2+V  COMPARISON:  None.  FINDINGS: There is no evidence of fracture or dislocation. There is no evidence of arthropathy or other focal bone abnormality. Soft tissues are unremarkable  IMPRESSION: Negative.   Electronically Signed   By: Shon Hale M.D.   On: 09/14/2014 22:28     EKG Interpretation None     LACERATION REPAIR Performed by: Antonietta Breach Authorized by: Antonietta Breach Consent: Verbal consent obtained. Risks and benefits: risks, benefits and alternatives were discussed Consent given by: patient Patient identity confirmed: provided demographic data Prepped and Draped in normal sterile fashion Wound explored  Laceration Location: R thumb  Laceration Length: 1cm  No Foreign Bodies seen or palpated  Anesthesia: none  Local anesthetic: none  Anesthetic total: n/a  Irrigation method: syringe Amount of cleaning: standard  Skin closure: Dermabond  Number of sutures: none  Technique: simple  Patient tolerance: Patient tolerated the procedure well with no immediate complications.  MDM   Final diagnoses:  Finger laceration, initial encounter    Patient presents to the ED for further evaluation of laceration sustained when a glass cut her while doing dishes. Injury sustained 1 hour PTA.  Patient neurovascularly intact. No active bleeding. No glass FB in wound per Xray and none visualized on exam. Tdap booster UTD. Laceration repair was well tolerated. Pt has no comorbidities to effect normal wound healing. Discussed home care with pt and answered questions. Pt to follow up for wound check with her PCP as needed. Splint given for comfort. Patient given Tramadol for pain control as needed. Return precautions discussed and provided. Patient agreeable to plan with no unaddressed concerns.   Filed Vitals:   09/14/14 1905  BP: 126/88  Pulse: 115  Temp: 98.2 F (36.8 C)  TempSrc: Oral  Resp: 18  SpO2: 97%        Antonietta Breach, PA-C 09/14/14 2300

## 2014-09-14 NOTE — ED Notes (Signed)
Pt reports cutting hand from breaking a glass while washing dished. Bleeding controlled with pressure dressing. NAD.

## 2014-09-14 NOTE — ED Provider Notes (Signed)
Medical screening examination/treatment/procedure(s) were performed by non-physician practitioner and as supervising physician I was immediately available for consultation/collaboration.   EKG Interpretation None        Rudd, DO 09/14/14 2309

## 2014-10-03 ENCOUNTER — Telehealth: Payer: Self-pay | Admitting: Internal Medicine

## 2014-10-03 NOTE — Telephone Encounter (Signed)
Left message to call back  

## 2014-10-03 NOTE — Telephone Encounter (Signed)
Patient st she is late seeing Caryl Comes because she has been going to her pulmonary doctor instead. She is concerned and wants to see Caryl Comes for a check up because the pulmonologist stopped her heart meds (tenormin and coreg). Also, patient st she wants to go back to school. To do this, she needs a letter from [place] stating that she was under his treatment during 2012 and 2013. She says the deadline is 10/9 so she can pick it up in the office when available.  Spoke with Dr. Caryl Comes and made a letter for Ms. Gilmartin for April and May of 2012, because those are the only dates she was seen in that time period. Appointment made with Caryl Comes 11/16/2014. Confirmed appointment with patient. Informed her letter can be picked up at the office.

## 2014-10-03 NOTE — Telephone Encounter (Signed)
New message      Talk to the nurse regarding a note she needs to return to college and when does Dr Caryl Comes want to see pt again?

## 2014-10-30 ENCOUNTER — Encounter (HOSPITAL_COMMUNITY): Payer: Self-pay | Admitting: Emergency Medicine

## 2014-11-10 ENCOUNTER — Emergency Department (HOSPITAL_COMMUNITY): Payer: Medicaid Other

## 2014-11-10 ENCOUNTER — Emergency Department (HOSPITAL_COMMUNITY)
Admission: EM | Admit: 2014-11-10 | Discharge: 2014-11-10 | Disposition: A | Discharge status: Home / Self Care | Payer: Medicaid Other | Attending: Emergency Medicine | Admitting: Emergency Medicine

## 2014-11-10 ENCOUNTER — Encounter (HOSPITAL_COMMUNITY): Payer: Self-pay | Admitting: *Deleted

## 2014-11-10 DIAGNOSIS — Z8739 Personal history of other diseases of the musculoskeletal system and connective tissue: Secondary | ICD-10-CM | POA: Insufficient documentation

## 2014-11-10 DIAGNOSIS — J45901 Unspecified asthma with (acute) exacerbation: Secondary | ICD-10-CM | POA: Insufficient documentation

## 2014-11-10 DIAGNOSIS — R059 Cough, unspecified: Secondary | ICD-10-CM

## 2014-11-10 DIAGNOSIS — R011 Cardiac murmur, unspecified: Secondary | ICD-10-CM | POA: Diagnosis not present

## 2014-11-10 DIAGNOSIS — F419 Anxiety disorder, unspecified: Secondary | ICD-10-CM | POA: Diagnosis not present

## 2014-11-10 DIAGNOSIS — Z79899 Other long term (current) drug therapy: Secondary | ICD-10-CM | POA: Diagnosis not present

## 2014-11-10 DIAGNOSIS — R05 Cough: Secondary | ICD-10-CM

## 2014-11-10 DIAGNOSIS — J069 Acute upper respiratory infection, unspecified: Secondary | ICD-10-CM | POA: Diagnosis not present

## 2014-11-10 DIAGNOSIS — Z8619 Personal history of other infectious and parasitic diseases: Secondary | ICD-10-CM | POA: Diagnosis not present

## 2014-11-10 DIAGNOSIS — K219 Gastro-esophageal reflux disease without esophagitis: Secondary | ICD-10-CM | POA: Insufficient documentation

## 2014-11-10 DIAGNOSIS — F329 Major depressive disorder, single episode, unspecified: Secondary | ICD-10-CM | POA: Insufficient documentation

## 2014-11-10 DIAGNOSIS — Z9889 Other specified postprocedural states: Secondary | ICD-10-CM | POA: Diagnosis not present

## 2014-11-10 DIAGNOSIS — I10 Essential (primary) hypertension: Secondary | ICD-10-CM | POA: Diagnosis not present

## 2014-11-10 DIAGNOSIS — Z8701 Personal history of pneumonia (recurrent): Secondary | ICD-10-CM | POA: Insufficient documentation

## 2014-11-10 DIAGNOSIS — J209 Acute bronchitis, unspecified: Secondary | ICD-10-CM

## 2014-11-10 DIAGNOSIS — Z720 Tobacco use: Secondary | ICD-10-CM | POA: Insufficient documentation

## 2014-11-10 MED ORDER — DEXTROMETHORPHAN POLISTIREX 30 MG/5ML PO LQCR
60.0000 mg | Freq: Once | ORAL | Status: AC
  Administered 2014-11-10: 60 mg via ORAL
  Filled 2014-11-10 (×2): qty 10

## 2014-11-10 MED ORDER — ALBUTEROL SULFATE HFA 108 (90 BASE) MCG/ACT IN AERS: 2.0000 | INHALATION_SPRAY | Freq: Four times a day (QID) | RESPIRATORY_TRACT | Status: DC | PRN

## 2014-11-10 MED ORDER — AEROCHAMBER Z-STAT PLUS/MEDIUM MISC
1.0000 | Freq: Once | Status: AC
  Administered 2014-11-10: 1
  Filled 2014-11-10: qty 1

## 2014-11-10 MED ORDER — ALBUTEROL SULFATE (2.5 MG/3ML) 0.083% IN NEBU
5.0000 mg | INHALATION_SOLUTION | Freq: Once | RESPIRATORY_TRACT | Status: AC
  Administered 2014-11-10: 5 mg via RESPIRATORY_TRACT
  Filled 2014-11-10: qty 6

## 2014-11-10 MED ORDER — DEXTROMETHORPHAN POLISTIREX 30 MG/5ML PO LQCR: 60.0000 mg | Freq: Two times a day (BID) | ORAL | Status: DC | PRN

## 2014-11-10 MED ORDER — AZITHROMYCIN 250 MG PO TABS: ORAL_TABLET | ORAL | Status: DC

## 2014-11-10 MED ORDER — IPRATROPIUM BROMIDE 0.02 % IN SOLN
0.5000 mg | Freq: Once | RESPIRATORY_TRACT | Status: AC
  Administered 2014-11-10: 0.5 mg via RESPIRATORY_TRACT
  Filled 2014-11-10: qty 2.5

## 2014-11-10 NOTE — ED Provider Notes (Signed)
CSN: [id number]     Arrival date & time 11/10/14  4098 History   First MD Initiated Contact with Patient 11/10/14 1023     Chief Complaint  Patient presents with  . Cough     (Consider location/radiation/quality/duration/timing/severity/associated sxs/prior Treatment) HPI  Patient reports 6 days ago she started with cold symptoms after being exposed to other sick family members. She also states she got a flu shot 2-3 weeks ago which she states "always makes me get sick". He states it started out with clear rhinorrhea and sneezing and now she has green rhinorrhea. She has a cough and a sore throat that's also itchy. She states her left ear is draining clear fluid. She states her highest temp was 100.14 days ago. She states she feels dizzy on standing. She had chills yesterday. She states she has been wheezing but she has not been using her inhaler. She had posttussive vomiting 2 days ago. She states she feels short of breath and it's worse when she lays down.  PCP Dr Ayesha Rumpf  Past Medical History  Diagnosis Date  . Achalasia     esphog stretched several times  . Abnormal Pap smear   . Depression   . HSV-2 infection   . PVC's (premature ventricular contractions)   . Anxiety   . Blood transfusion 01/2006    [place]  . GERD (gastroesophageal reflux disease)     no meds - diet controlled  . Asthma     rarely uses inhaler  . Arthritis   . History of abnormal Pap smear   . History of chlamydia 1990's  . Fibromyalgia   . Pneumonia   . Hiatal hernia   . Cardiomyopathy   . Status post dilation of esophageal narrowing   . HTN (hypertension)   . Heart murmur    Past Surgical History  Procedure Laterality Date  . Tonsillectomy    . Adenoidectomy  12/2002  . Esophagogastroduodenoscopy      with esophageal dilation x 3  . Dilation and curettage of uterus  1999  . Breast surgery  07/2001    tumor removed right breast  . Dilation and curettage of uterus  01/2006    retained products  .  Bladder suspension  06/11/2012    Procedure: TRANSVAGINAL TAPE (TVT) PROCEDURE;  Surgeon: Delice Lesch, MD;  Location: [facility];  Service: Gynecology;  Laterality: N/A;  Tension Free Vaginal Tape/cystoscopy  . Laparoscopic tubal ligation  06/11/2012    Procedure: LAPAROSCOPIC TUBAL LIGATION;  Surgeon: Delice Lesch, MD;  Location: [facility];  Service: Gynecology;  Laterality: Bilateral;  Laparoscopic Bilateral Fulgeration  . Cystoscopy  06/11/2012    Procedure: CYSTOSCOPY;  Surgeon: Delice Lesch, MD;  Location: [facility];  Service: Gynecology;  Laterality: N/A;  . Foreign body removal  06/11/2012    Procedure: REMOVAL FOREIGN BODY EXTREMITY;  Surgeon: Delice Lesch, MD;  Location: [facility];  Service: Gynecology;  Laterality: Right;  Removal of Implanon    Family History  Problem Relation Age of Onset  . Heart disease Father   . Hypertension Father   . Stroke Mother   . Hypertension Mother   . Heart attack Mother   . Breast cancer Mother   . Neuropathy Mother   . Arthritis Mother   . Neuropathy Sister   . Arthritis Sister   . Asthma Mother   . Asthma Sister    History  Substance Use Topics  . Smoking status: Current Some Day Smoker --  0.25 packs/day for 15 years    Types: Cigarettes  . Smokeless tobacco: Never Used     Comment: HAS CUT BACK  . 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     Allergies  Review of patient's allergies indicates no known allergies.  Home Medications   Prior to Admission medications   Medication Sig Start Date End Date Taking? Authorizing Provider  clonazePAM (KLONOPIN) 1 MG tablet Take 1 mg by mouth at bedtime as needed. For anxiety   Yes Historical Provider, MD  famotidine (PEPCID) 20 MG tablet One after bfast and one after  supper 09/05/14  Yes Tanda Rockers, MD  albuterol (PROAIR HFA) 108 (90 BASE) MCG/ACT inhaler Inhale 2 puffs into the lungs every 6 (six) hours as needed for wheezing or shortness of breath.    Historical Provider, MD  EPINEPHrine (EPIPEN) 0.3 mg/0.3 mL SOAJ injection Inject 0.3 mg into the muscle once as needed (for allergies).    Historical Provider, MD  oxyCODONE-acetaminophen (PERCOCET/ROXICET) 5-325 MG per tablet Take 1 tablet by mouth every 6 (six) hours as needed for moderate pain or severe pain. Patient not taking: Reported on 11/10/2014 09/14/14   Antonietta Breach, PA-C   BP 113/81 mmHg  Pulse 88  Temp(Src) 98.2 F (36.8 C) (Oral)  Resp 17  SpO2 98%  LMP 11/10/2014  Vital signs normal   Physical Exam  Constitutional: She is oriented to person, place, and time. She appears well-developed and well-nourished.  Non-toxic appearance. She does not appear ill. No distress.  HENT:  Head: Normocephalic and atraumatic.  Right Ear: Hearing, tympanic membrane, external ear and ear canal normal.  Left Ear: Tympanic membrane, external ear and ear canal normal.  Nose: Nose normal. No mucosal edema or rhinorrhea.  Mouth/Throat: Oropharynx is clear and moist and mucous membranes are normal. No dental abscesses or uvula swelling.  No fluid seen in the canal on either side  Eyes: Conjunctivae and EOM are normal. Pupils are equal, round, and reactive to light.  Neck: Normal range of motion and full passive range of motion without pain. Neck supple.  Cardiovascular: Normal rate, regular rhythm and normal heart sounds.  Exam reveals no gallop and no friction rub.   No murmur heard. Pulmonary/Chest: Effort normal. No respiratory distress. She has wheezes. She has no rhonchi. She has no rales. She exhibits no tenderness and no crepitus.  Mild diffuse wheezing, some coughing  Abdominal: Soft. Normal appearance and bowel sounds are normal. She exhibits no distension. There is no tenderness. There is no rebound  and no guarding.  Musculoskeletal: Normal range of motion. She exhibits no edema or tenderness.  Moves all extremities well.   Neurological: She is alert and oriented to person, place, and time. She has normal strength. No cranial nerve deficit.  Skin: Skin is warm, dry and intact. No rash noted. No erythema. No pallor.  Psychiatric: She has a normal mood and affect. Her speech is normal and behavior is normal. Her mood appears not anxious.  Nursing note and vitals reviewed.   ED Course  Procedures (including critical care time)  Medications  aerochamber Z-Stat Plus/medium 1 each (not administered)  albuterol (PROVENTIL) (2.5  MG/3ML) 0.083% nebulizer solution 5 mg (5 mg Nebulization Given 11/10/14 1250)  ipratropium (ATROVENT) nebulizer solution 0.5 mg (0.5 mg Nebulization Given 11/10/14 1250)  dextromethorphan (DELSYM) 30 MG/5ML liquid 60 mg (60 mg Oral Given 11/10/14 1309)   Patient states she cannot swallow any pills. She was given liquid Delsym for her cough. She was given a nebulizer treatment for her wheezing.  13:45 recheck after nebulizer treatment, pt is sitting on the end of her stretcher in NAD. Her lung sounds are mildly diminished but her wheezing is gone, no rhonchi. Patient given her test results. We discussed she has bronchitis with bronchospasm.  Labs Review  Imaging Review Dg Chest 2 View  11/10/2014   CLINICAL DATA:  Fever with chills, chest congestion, cough, chest pain, shortness of breath for 1 week, weakness in arms, history hypertension, cardiomyopathy  EXAM: CHEST  2 VIEW  COMPARISON:  08/08/2014  FINDINGS: Normal heart size, mediastinal contours, and pulmonary vascularity.  Lungs clear.  No pleural effusion or pneumothorax.  Bones unremarkable.  IMPRESSION: No acute abnormalities.   Electronically Signed   By: Lavonia Dana M.D.   On: 11/10/2014 11:51     EKG Interpretation None      MDM   Final diagnoses:  Bronchitis with bronchospasm  URI (upper  respiratory infection)    New Prescriptions   ALBUTEROL (PROVENTIL HFA;VENTOLIN HFA) 108 (90 BASE) MCG/ACT INHALER    Inhale 2 puffs into the lungs every 6 (six) hours as needed for wheezing or shortness of breath.   AZITHROMYCIN (ZITHROMAX Z-PAK) 250 MG TABLET    Take 2 po the first day then once a day for the next 4 days.   DEXTROMETHORPHAN (DELSYM) 30 MG/5ML LIQUID    Take 10 mLs (60 mg total) by mouth 2 (two) times daily as needed for cough.    Plan discharge  Rolland Porter, MD, Alanson Aly, MD 11/10/14 [phone]

## 2014-11-10 NOTE — Discharge Instructions (Signed)
Use the inhaler with the spacer to make it more effective. Take the delsym for your cough. Monitor your temperature, take acetaminophen 1000 mg 4 times a day for fever if needed. You can take zyrtec OTC for sneezing or runny nose. Recheck if you get a high fever, struggle to breathe or have uncontrolled vomiting.

## 2014-11-10 NOTE — ED Notes (Signed)
Patient coming from home with symptoms of coughing, chest burning from coughing, nasal congestion, itching eyes, body aches, headaches, cold chills.  Pt states she is unable to swallow pills due to having a "dysphagia".

## 2014-11-10 NOTE — ED Notes (Signed)
Patient was dressed and refused to have vital signs taken prior to discharge.

## 2014-11-10 NOTE — ED Notes (Signed)
Pt has multiple complaints. Reports having pneumonia x 2 recently. Now having productive cough again with congestion, fever, bodyaches, headache. No acute distress noted at triage.

## 2014-11-16 ENCOUNTER — Ambulatory Visit: Payer: Medicaid Other | Admitting: Internal Medicine

## 2015-01-02 ENCOUNTER — Encounter (HOSPITAL_COMMUNITY): Payer: Self-pay

## 2015-01-02 ENCOUNTER — Emergency Department (HOSPITAL_COMMUNITY): Payer: Medicaid Other

## 2015-01-02 ENCOUNTER — Emergency Department (HOSPITAL_COMMUNITY)
Admission: EM | Admit: 2015-01-02 | Discharge: 2015-01-02 | Disposition: A | Discharge status: Home / Self Care | Payer: Medicaid Other | Attending: Emergency Medicine | Admitting: Emergency Medicine

## 2015-01-02 DIAGNOSIS — J45909 Unspecified asthma, uncomplicated: Secondary | ICD-10-CM | POA: Diagnosis not present

## 2015-01-02 DIAGNOSIS — R011 Cardiac murmur, unspecified: Secondary | ICD-10-CM | POA: Diagnosis not present

## 2015-01-02 DIAGNOSIS — R112 Nausea with vomiting, unspecified: Secondary | ICD-10-CM | POA: Diagnosis not present

## 2015-01-02 DIAGNOSIS — F329 Major depressive disorder, single episode, unspecified: Secondary | ICD-10-CM | POA: Insufficient documentation

## 2015-01-02 DIAGNOSIS — Z720 Tobacco use: Secondary | ICD-10-CM | POA: Diagnosis not present

## 2015-01-02 DIAGNOSIS — F419 Anxiety disorder, unspecified: Secondary | ICD-10-CM | POA: Diagnosis not present

## 2015-01-02 DIAGNOSIS — Z8619 Personal history of other infectious and parasitic diseases: Secondary | ICD-10-CM | POA: Insufficient documentation

## 2015-01-02 DIAGNOSIS — K8020 Calculus of gallbladder without cholecystitis without obstruction: Secondary | ICD-10-CM | POA: Insufficient documentation

## 2015-01-02 DIAGNOSIS — Z79899 Other long term (current) drug therapy: Secondary | ICD-10-CM | POA: Diagnosis not present

## 2015-01-02 DIAGNOSIS — I10 Essential (primary) hypertension: Secondary | ICD-10-CM | POA: Diagnosis not present

## 2015-01-02 DIAGNOSIS — R1010 Upper abdominal pain, unspecified: Secondary | ICD-10-CM | POA: Diagnosis present

## 2015-01-02 DIAGNOSIS — Z8701 Personal history of pneumonia (recurrent): Secondary | ICD-10-CM | POA: Diagnosis not present

## 2015-01-02 LAB — CBC WITH DIFFERENTIAL/PLATELET
BASOS ABS: 0 10*3/uL (ref 0.0–0.1)
Basophils Relative: 0 % (ref 0–1)
Eosinophils Absolute: 0.1 10*3/uL (ref 0.0–0.7)
Eosinophils Relative: 1 % (ref 0–5)
HCT: 38.9 % (ref 36.0–46.0)
Hemoglobin: 13.3 g/dL (ref 12.0–15.0)
LYMPHS PCT: 36 % (ref 12–46)
Lymphs Abs: 3.1 10*3/uL (ref 0.7–4.0)
MCH: 30.6 pg (ref 26.0–34.0)
MCHC: 34.2 g/dL (ref 30.0–36.0)
MCV: 89.4 fL (ref 78.0–100.0)
MONO ABS: 0.5 10*3/uL (ref 0.1–1.0)
Monocytes Relative: 6 % (ref 3–12)
NEUTROS ABS: 5.1 10*3/uL (ref 1.7–7.7)
Neutrophils Relative %: 57 % (ref 43–77)
PLATELETS: 219 10*3/uL (ref 150–400)
RBC: 4.35 MIL/uL (ref 3.87–5.11)
RDW: 12.7 % (ref 11.5–15.5)
WBC: 8.8 10*3/uL (ref 4.0–10.5)

## 2015-01-02 LAB — COMPREHENSIVE METABOLIC PANEL
ALT: 22 U/L (ref 0–35)
ANION GAP: 7 (ref 5–15)
AST: 31 U/L (ref 0–37)
Albumin: 4.6 g/dL (ref 3.5–5.2)
Alkaline Phosphatase: 67 U/L (ref 39–117)
BILIRUBIN TOTAL: 0.5 mg/dL (ref 0.3–1.2)
BUN: 11 mg/dL (ref 6–23)
CALCIUM: 9.4 mg/dL (ref 8.4–10.5)
CHLORIDE: 106 meq/L (ref 96–112)
CO2: 24 mmol/L (ref 19–32)
Creatinine, Ser: 0.84 mg/dL (ref 0.50–1.10)
GFR, EST NON AFRICAN AMERICAN: 89 mL/min — AB (ref >90)
GLUCOSE: 97 mg/dL (ref 70–99)
Potassium: 3.9 mmol/L (ref 3.5–5.1)
Sodium: 137 mmol/L (ref 135–145)
Total Protein: 7.4 g/dL (ref 6.0–8.3)

## 2015-01-02 LAB — LIPASE, BLOOD: LIPASE: 31 U/L (ref 11–59)

## 2015-01-02 LAB — I-STAT TROPONIN, ED: TROPONIN I, POC: 0 ng/mL (ref 0.00–0.08)

## 2015-01-02 MED ORDER — OXYCODONE-ACETAMINOPHEN 5-325 MG PO TABS: 1.0000 | ORAL_TABLET | Freq: Four times a day (QID) | ORAL | Status: DC | PRN

## 2015-01-02 MED ORDER — ONDANSETRON HCL 4 MG/2ML IJ SOLN
4.0000 mg | Freq: Once | INTRAMUSCULAR | Status: AC
  Administered 2015-01-02: 4 mg via INTRAVENOUS
  Filled 2015-01-02: qty 2

## 2015-01-02 MED ORDER — FENTANYL CITRATE 0.05 MG/ML IJ SOLN
50.0000 ug | Freq: Once | INTRAMUSCULAR | Status: AC
  Administered 2015-01-02: 50 ug via INTRAVENOUS
  Filled 2015-01-02: qty 2

## 2015-01-02 MED ORDER — SENNOSIDES-DOCUSATE SODIUM 8.6-50 MG PO TABS: 2.0000 | ORAL_TABLET | Freq: Every day | ORAL | Status: DC

## 2015-01-02 MED ORDER — SODIUM CHLORIDE 0.9 % IV BOLUS (SEPSIS)
1000.0000 mL | Freq: Once | INTRAVENOUS | Status: AC
  Administered 2015-01-02: 1000 mL via INTRAVENOUS

## 2015-01-02 MED ORDER — IOHEXOL 300 MG/ML  SOLN: 25.0000 mL | Freq: Once | INTRAMUSCULAR | Status: DC | PRN

## 2015-01-02 MED ORDER — IOHEXOL 300 MG/ML  SOLN
100.0000 mL | Freq: Once | INTRAMUSCULAR | Status: AC | PRN
  Administered 2015-01-02: 100 mL via INTRAVENOUS

## 2015-01-02 NOTE — ED Notes (Signed)
Dr.Lockwood at bedside  

## 2015-01-02 NOTE — ED Notes (Addendum)
Dr.Lockwood at bedside  

## 2015-01-02 NOTE — ED Notes (Signed)
Pt here for RUQ pain for the past 2-3 weeks with some vomiting off and on. Hurts worse when eating, sleeping, breathing. Denies any diarrhea.

## 2015-01-02 NOTE — Discharge Instructions (Signed)
As discussed, it is important that you follow up with our surgeons for continued management of your condition.  If you develop any new, or concerning changes in your condition, please return to the emergency department immediately.

## 2015-01-02 NOTE — ED Notes (Signed)
Called CT, patient finished contrast.

## 2015-01-02 NOTE — ED Provider Notes (Signed)
CSN: [id number]     Arrival date & time 01/02/15  1603 History   First MD Initiated Contact with Patient 01/02/15 1846     Chief Complaint  Patient presents with  . Abdominal Pain      HPI  Patient presents with concern of ongoing upper abdominal pain, more prominently on the right, though throughout the upper abdomen. Patient has been present for some time, the worse over the past day. Pain is worse after oral intake, and there is associated nausea. Last vomiting was 2 days ago. No concurrent fever, chills, chest pain, dyspnea. Patient notes that symptoms are similar, though more pronounced than a recent episode of colitis.   Past Medical History  Diagnosis Date  . Achalasia     esphog stretched several times  . Abnormal Pap smear   . Depression   . HSV-2 infection   . PVC's (premature ventricular contractions)   . Anxiety   . Blood transfusion 01/2006    [facility]  . GERD (gastroesophageal reflux disease)     no meds - diet controlled  . Asthma     rarely uses inhaler  . Arthritis   . History of abnormal Pap smear   . History of chlamydia 1990's  . Fibromyalgia   . Pneumonia   . Hiatal hernia   . Cardiomyopathy   . Status post dilation of esophageal narrowing   . HTN (hypertension)   . Heart murmur    Past Surgical History  Procedure Laterality Date  . Tonsillectomy    . Adenoidectomy  12/2002  . Esophagogastroduodenoscopy      with esophageal dilation x 3  . Dilation and curettage of uterus  1999  . Breast surgery  07/2001    tumor removed right breast  . Dilation and curettage of uterus  01/2006    retained products  . Bladder suspension  06/11/2012    Procedure: TRANSVAGINAL TAPE (TVT) PROCEDURE;  Surgeon: Delice Lesch, MD;  Location: [facility];  Service: Gynecology;  Laterality: N/A;  Tension Free Vaginal Tape/cystoscopy  . Laparoscopic tubal ligation  06/11/2012    Procedure: LAPAROSCOPIC TUBAL LIGATION;  Surgeon: Delice Lesch, MD;  Location: [facility];  Service:  Gynecology;  Laterality: Bilateral;  Laparoscopic Bilateral Fulgeration  . Cystoscopy  06/11/2012    Procedure: CYSTOSCOPY;  Surgeon: Delice Lesch, MD;  Location: [facility];  Service: Gynecology;  Laterality: N/A;  . Foreign body removal  06/11/2012    Procedure: REMOVAL FOREIGN BODY EXTREMITY;  Surgeon: Delice Lesch, MD;  Location: [facility];  Service: Gynecology;  Laterality: Right;  Removal of Implanon    Family History  Problem Relation Age of Onset  . Heart disease Father   . Hypertension Father   . Stroke Mother   . Hypertension Mother   . Heart attack Mother   . Breast cancer Mother   . Neuropathy Mother   . Arthritis Mother   . Neuropathy Sister   . Arthritis Sister   . Asthma Mother   . Asthma Sister    History  Substance Use Topics  . Smoking status: Current Some Day Smoker -- 0.25 packs/day for 15 years    Types: Cigarettes  . Smokeless tobacco: Never Used     Comment: HAS CUT BACK  . Alcohol Use: No     Comment: on occasion   OB History    Gravida Para Term Preterm AB TAB SAB Ectopic Multiple Living   4 2 2  0 2  2  2     Review of Systems  Constitutional:       Per HPI, otherwise negative  HENT:       Per HPI, otherwise negative  Respiratory:       Per HPI, otherwise negative  Cardiovascular:       Per HPI, otherwise negative  Gastrointestinal: Positive for nausea, vomiting, abdominal pain and diarrhea.  Endocrine:       Negative aside from HPI  Genitourinary:       Neg aside from HPI   Musculoskeletal:       Per HPI, otherwise negative  Skin: Negative.   Neurological: Negative for syncope.      Allergies  Review of patient's allergies indicates no known allergies.  Home Medications   Prior to Admission medications   Medication Sig Start Date End Date Taking? Authorizing Provider  albuterol (PROVENTIL HFA;VENTOLIN HFA) 108 (90 BASE) MCG/ACT inhaler Inhale 2 puffs into the lungs every 6 (six) hours as needed for wheezing or shortness of  breath. 11/10/14  Yes Janice Norrie, MD  clonazePAM (KLONOPIN) 1 MG tablet Take 1 mg by mouth at bedtime as needed. For anxiety   Yes Historical Provider, MD  EPINEPHrine (EPIPEN) 0.3 mg/0.3 mL SOAJ injection Inject 0.3 mg into the muscle once as needed (for allergies).   Yes Historical Provider, MD  famotidine (PEPCID) 20 MG tablet Take 20 mg by mouth 2 (two) times daily.   Yes Historical Provider, MD  azithromycin (ZITHROMAX Z-PAK) 250 MG tablet Take 2 po the first day then once a day for the next 4 days. Patient not taking: Reported on 01/02/2015 11/10/14   Janice Norrie, MD  dextromethorphan (DELSYM) 30 MG/5ML liquid Take 10 mLs (60 mg total) by mouth 2 (two) times daily as needed for cough. Patient not taking: Reported on 01/02/2015 11/10/14   Janice Norrie, MD  famotidine (PEPCID) 20 MG tablet One after bfast and one after supper Patient not taking: Reported on 01/02/2015 09/05/14   Tanda Rockers, MD  oxyCODONE-acetaminophen (PERCOCET/ROXICET) 5-325 MG per tablet Take 1 tablet by mouth every 6 (six) hours as needed for moderate pain or severe pain. Patient not taking: Reported on 11/10/2014 09/14/14   Antonietta Breach, PA-C   BP 124/64 mmHg  Pulse 74  Temp(Src) 97.8 F (36.6 C) (Oral)  Resp 20  Ht 5\' 9"  (1.753 m)  Wt 192 lb (87.091 kg)  BMI 28.34 kg/m2  SpO2 98%  LMP 12/02/2014 Physical Exam  Constitutional: She is oriented to person, place, and time. She appears well-developed and well-nourished. No distress.  HENT:  Head: Normocephalic and atraumatic.  Eyes: Conjunctivae and EOM are normal.  Cardiovascular: Normal rate and regular rhythm.   Pulmonary/Chest: Effort normal and breath sounds normal. No stridor. No respiratory distress.  Abdominal: She exhibits no distension.  Mild pain with guarding throughout the upper abdomen. No peritoneal findings.   Musculoskeletal: She exhibits no edema.  Neurological: She is alert and oriented to person, place, and time. No cranial nerve deficit.  Skin:  Skin is warm and dry.  Psychiatric: She has a normal mood and affect.  Nursing note and vitals reviewed.   ED Course  Procedures (including critical care time) Labs Review Labs Reviewed  COMPREHENSIVE METABOLIC PANEL - Abnormal; Notable for the following:    GFR calc non Af Amer 89 (*)    All other components within normal limits  CBC WITH DIFFERENTIAL  LIPASE, BLOOD  I-STAT TROPOININ, ED    Imaging  Review Ct Abdomen Pelvis W Contrast  01/02/2015   CLINICAL DATA:  RIGHT upper quadrant pain for 2-3 weeks, worse when eating.  EXAM: CT ABDOMEN AND PELVIS WITH CONTRAST  TECHNIQUE: Multidetector CT imaging of the abdomen and pelvis was performed using the standard protocol following bolus administration of intravenous contrast.  CONTRAST:  11mL OMNIPAQUE IOHEXOL 300 MG/ML  SOLN  COMPARISON:  CT of the abdomen and pelvis January 22, 2014  FINDINGS: LUNG BASES: Included view of the lung bases demonstrates heterogeneous lung attenuation which can be seen with small airway disease, no pleural effusion or focal consolidation. Visualized heart and pericardium are unremarkable.  SOLID ORGANS: The liver, spleen, pancreas and adrenal glands are unremarkable. Multiple fat density gallstones without superimposed inflammation.  GASTROINTESTINAL TRACT: The stomach, small and large bowel are normal in course and caliber without inflammatory changes. Normal appendix. Mild colonic diverticulosis. Metallic density and ascending colon may reflect ingested material. Enteric contrast has not yet reached the distal small bowel. Mild amount of retained large bowel stool.  KIDNEYS/ URINARY TRACT: Kidneys are orthotopic, demonstrating symmetric enhancement. No nephrolithiasis, hydronephrosis or solid renal masses. The unopacified ureters are normal in course and caliber. Urinary bladder is partially distended and unremarkable.  PERITONEUM/RETROPERITONEUM: No intraperitoneal free fluid nor free air. Aortoiliac vessels are  normal in course and caliber. No lymphadenopathy by CT size criteria. Internal reproductive organs are unremarkable.  SOFT TISSUE/OSSEOUS STRUCTURES: Nonsuspicious.  IMPRESSION: Cholelithiasis without CT findings of acute cholecystitis.  Mild colonic diverticulosis. Mild amount of retained large bowel stool without bowel obstruction.   Electronically Signed   By: Elon Alas   On: 01/02/2015 21:59     EKG Interpretation   Date/Time:  Tuesday January 02 2015 16:18:05 EST Ventricular Rate:  89 PR Interval:  154 QRS Duration: 88 QT Interval:  378 QTC Calculation: 459 R Axis:   34 Text Interpretation:  Normal sinus rhythm Anterior infarct , age  undetermined Abnormal ECG Sinus rhythm q waves anteriorly No significant  change since last tracing Abnormal ekg Confirmed by Carmin Muskrat  MD  [phone]) on 01/02/2015 6:47:24 PM     11:36 PM Patient aware of all results.  She now states that during a prior episode of colitis she was also made aware of gallstones. Patient is in no distress currently. We discussed the need to follow-up with surgery as an outpatient, as well as primary care.  MDM   Final diagnoses:  Pain of upper abdomen  Calculus of gallbladder without cholecystitis without obstruction   Patient presents with ongoing abdominal pain, most prominent after by mouth intake. Given the patient's history of recent colitis, as well as her tenderness throughout her upper abdomen, CT scan performed. This, and labs were largely reassuring, though there was evidence for cholelithiasis, without cholecystitis. Patient's pain was well-controlled, vital signs weight stable, and she was discharged in stable condition to follow-up with primary care, surgery.    Carmin Muskrat, MD 01/02/15 [phone]

## 2015-01-03 NOTE — ED Notes (Signed)
12mcg of fentanyl wasted with Gaston Islam RN

## 2015-01-04 ENCOUNTER — Telehealth: Payer: Self-pay | Admitting: Internal Medicine

## 2015-01-04 ENCOUNTER — Other Ambulatory Visit (INDEPENDENT_AMBULATORY_CARE_PROVIDER_SITE_OTHER): Payer: Self-pay | Admitting: General Surgery

## 2015-01-04 NOTE — Telephone Encounter (Signed)
Needing cardiac clearance. She will fax me clearance request for Dr. Caryl Comes to complete at the appointment.

## 2015-01-04 NOTE — Telephone Encounter (Signed)
New Message     Patient has appt 01/11/15 and Raven [name] from [facility] needs surgical clearance approved on this day if possible. Please call back.

## 2015-01-11 ENCOUNTER — Ambulatory Visit (INDEPENDENT_AMBULATORY_CARE_PROVIDER_SITE_OTHER): Payer: Medicaid Other | Admitting: Internal Medicine

## 2015-01-11 ENCOUNTER — Encounter: Payer: Self-pay | Admitting: Internal Medicine

## 2015-01-11 VITALS — BP 120/82 | HR 81 | Ht 69.0 in | Wt 196.0 lb

## 2015-01-11 DIAGNOSIS — I493 Ventricular premature depolarization: Secondary | ICD-10-CM

## 2015-01-11 DIAGNOSIS — I429 Cardiomyopathy, unspecified: Secondary | ICD-10-CM

## 2015-01-11 DIAGNOSIS — Z79899 Other long term (current) drug therapy: Secondary | ICD-10-CM

## 2015-01-11 DIAGNOSIS — I428 Other cardiomyopathies: Secondary | ICD-10-CM

## 2015-01-11 MED ORDER — LOSARTAN POTASSIUM 25 MG PO TABS: 25.0000 mg | ORAL_TABLET | Freq: Every day | ORAL | Status: DC

## 2015-01-11 NOTE — Patient Instructions (Signed)
Your physician has recommended you make the following change in your medication:  1) START Losartan 25 mg daily  Your physician recommends that you return for lab work in: 2 weeks for BMET   Your physician has requested that you have an echocardiogram. Echocardiography is a painless test that uses sound waves to create images of your heart. It provides your doctor with information about the size and shape of your heart and how well your heart's chambers and valves are working. This procedure takes approximately one hour. There are no restrictions for this procedure.  Your physician recommends that you schedule a follow-up appointment in: 6 weeks with a PA/NP.

## 2015-01-11 NOTE — Progress Notes (Signed)
Patient Care Team: Kristine Garbe, MD as PCP - General (Family Medicine)   HPI  Raven [name] is a 34 y.o. female Seen in followup for PVCs and sinus tach assoc with palps and anxiety;   EF 45% by echo 12/14   Atenolol changed to metoprolol without significant improvement.  30 day recorder>> PVCs albethey  very infrequent   Denies exercise intolerance.  She has developed need for cholecystectomy.  She denies edema. There is some fatigue. She has intermittent sporadic chest pains and arm pains.  She has questions regarding her prior testing demonstrating an ejection fraction of 40-45% and the Myoview scan subsequently done which demonstrated no significant ischemia   Past Medical History  Diagnosis Date  . Achalasia     esphog stretched several times  . Abnormal Pap smear   . Depression   . HSV-2 infection   . PVC's (premature ventricular contractions)   . Anxiety   . Blood transfusion 01/2006    [place]  . GERD (gastroesophageal reflux disease)     no meds - diet controlled  . Asthma     rarely uses inhaler  . Arthritis   . History of abnormal Pap smear   . History of chlamydia 1990's  . Fibromyalgia   . Pneumonia   . Hiatal hernia   . Cardiomyopathy   . Status post dilation of esophageal narrowing   . HTN (hypertension)   . Heart murmur     Past Surgical History  Procedure Laterality Date  . Tonsillectomy    . Adenoidectomy  12/2002  . Esophagogastroduodenoscopy      with esophageal dilation x 3  . Dilation and curettage of uterus  1999  . Breast surgery  07/2001    tumor removed right breast  . Dilation and curettage of uterus  01/2006    retained products  . Bladder suspension  06/11/2012    Procedure: TRANSVAGINAL TAPE (TVT) PROCEDURE;  Surgeon: Delice Lesch, MD;  Location: [facility];  Service: Gynecology;  Laterality: N/A;  Tension Free Vaginal Tape/cystoscopy  . Laparoscopic tubal ligation  06/11/2012    Procedure: LAPAROSCOPIC TUBAL  LIGATION;  Surgeon: Delice Lesch, MD;  Location: [facility];  Service: Gynecology;  Laterality: Bilateral;  Laparoscopic Bilateral Fulgeration  . Cystoscopy  06/11/2012    Procedure: CYSTOSCOPY;  Surgeon: Delice Lesch, MD;  Location: [facility];  Service: Gynecology;  Laterality: N/A;  . Foreign body removal  06/11/2012    Procedure: REMOVAL FOREIGN BODY EXTREMITY;  Surgeon: Delice Lesch, MD;  Location: [facility];  Service: Gynecology;  Laterality: Right;  Removal of Implanon     Current Outpatient Prescriptions  Medication Sig Dispense Refill  . albuterol (PROVENTIL HFA;VENTOLIN HFA) 108 (90 BASE) MCG/ACT inhaler Inhale 2 puffs into the lungs every 6 (six) hours as needed for wheezing or shortness of breath. 1 Inhaler 0  . clonazePAM (KLONOPIN) 1 MG tablet Take 1 mg by mouth at bedtime as needed. For anxiety    . EPINEPHrine (EPIPEN) 0.3 mg/0.3 mL SOAJ injection Inject 0.3 mg into the muscle once as needed (for allergies).    . famotidine (PEPCID) 20 MG tablet Take 20 mg by mouth 2 (two) times daily.    Marland Kitchen METROGEL VAGINAL 0.75 % vaginal gel Place 8.83 application vaginally as needed.  0  . senna-docusate (SENOKOT-S) 8.6-50 MG per tablet Take 2 tablets by mouth daily. 20 tablet 0   No current facility-administered medications for this visit.  No Known Allergies  Review of Systems negative except from HPI and PMH  Physical Exam BP 120/82 mmHg  Pulse 81  Ht 5\' 9"  (1.753 m)  Wt 196 lb (88.905 kg)  BMI 28.93 kg/m2  LMP 12/02/2014 Well developed and nourished in no acute distress HENT normal Neck supple with JVP-flat Clear Regular rate and rhythm, no murmurs or gallops Abd-soft with active BS No Clubbing cyanosis edema Skin-warm and dry A & Oriented  Grossly normal sensory and motor function  Event recorder demonstrated PVCs associated with each of her complaints of palpitations although the eCardio report printed of sinus rhythm on many of them  ECG demonstrated sinus rhythm at  81 Intervals 17/09/39 Axis XXXII Otherwise normal  Assessment and  Plan  Nonischemic cardiomyopathy  Preoperative clearance  PVCs-infrequent  She has evidence of a nonischemic cardiomyopathy; there is no evidence of decompensation or heart failure. There are date as supporting the use of ACE inhibitor therapy for the prevention of the development of symptoms. Dr. Milagros Loll note were reviewed; he discontinued her ACE inhibitor; we will use losartan. She will need about profile in 2 weeks time and we will have her seen in 6 weeks time for up titration and perhaps initiation of a low-dose beta blocker.  She is an acceptable risk for surgery

## 2015-01-18 ENCOUNTER — Telehealth: Payer: Self-pay

## 2015-01-18 NOTE — Telephone Encounter (Signed)
PA approved for Losartan Potassium 25 mg daily for 1 year.  PA # [id number]

## 2015-01-18 NOTE — Pre-Procedure Instructions (Signed)
Raven [name]  01/18/2015   Your procedure is scheduled on:  Tuesday, January 26th  Report to [facility] Admitting at 1130 AM.  Call this number if you have problems the morning of surgery: [phone]   Remember:   Do not eat food or drink liquids after midnight.   Take these medicines the morning of surgery with A SIP OF WATER: pepcid, albuterol if needed, klonopin if needed   Do not wear jewelry, make-up or nail polish.  Do not wear lotions, powders, or perfumes,deodorant.  Do not shave 48 hours prior to surgery. Men may shave face and neck.  Do not bring valuables to the hospital.  [facility] is not responsible for any belongings or valuables.               Contacts, dentures or bridgework may not be worn into surgery.  Leave suitcase in the car. After surgery it may be brought to your room.  For patients admitted to the hospital, discharge time is determined by your  treatment team.               Patients discharged the day of surgery will not be allowed to drive home.  Please read over the following fact sheets that you were given: Pain Booklet, Coughing and Deep Breathing and Surgical Site Infection Prevention   - Preparing for Surgery  Before surgery, you can play an important role.  Because skin is not sterile, your skin needs to be as free of germs as possible.  You can reduce the number of germs on you skin by washing with CHG (chlorahexidine gluconate) soap before surgery.  CHG is an antiseptic cleaner which kills germs and bonds with the skin to continue killing germs even after washing.  Please DO NOT use if you have an allergy to CHG or antibacterial soaps.  If your skin becomes reddened/irritated stop using the CHG and inform your nurse when you arrive at Short Stay.  Do not shave (including legs and underarms) for at least 48 hours prior to the first CHG shower.  You may shave your face.  Please follow these instructions carefully:   1.   Shower with CHG Soap the night before surgery and the morning of Surgery.  2.  If you choose to wash your hair, wash your hair first as usual with your normal shampoo.  3.  After you shampoo, rinse your hair and body thoroughly to remove the shampoo.  4.  Use CHG as you would any other liquid soap.  You can apply CHG directly to the skin and wash gently with scrungie or a clean washcloth.  5.  Apply the CHG Soap to your body ONLY FROM THE NECK DOWN.  Do not use on open wounds or open sores.  Avoid contact with your eyes, ears, mouth and genitals (private parts).  Wash genitals (private parts) with your normal soap.  6.  Wash thoroughly, paying special attention to the area where your surgery will be performed.  7.  Thoroughly rinse your body with warm water from the neck down.  8.  DO NOT shower/wash with your normal soap after using and rinsing off the CHG Soap.  9.  Pat yourself dry with a clean towel.            10.  Wear clean pajamas.            11.  Place clean sheets on your bed the night of your  first shower and do not sleep with pets.  Day of Surgery  Do not apply any lotions/deoderants the morning of surgery.  Please wear clean clothes to the hospital/surgery center.

## 2015-01-19 ENCOUNTER — Telehealth: Payer: Self-pay | Admitting: Nurse Practitioner

## 2015-01-19 ENCOUNTER — Encounter (HOSPITAL_COMMUNITY)
Admission: RE | Admit: 2015-01-19 | Discharge: 2015-01-19 | Disposition: A | Discharge status: Home / Self Care | Payer: Medicaid Other | Source: Ambulatory Visit | Attending: General Surgery | Admitting: General Surgery

## 2015-01-19 ENCOUNTER — Encounter (HOSPITAL_COMMUNITY): Payer: Self-pay

## 2015-01-19 DIAGNOSIS — Z01818 Encounter for other preprocedural examination: Secondary | ICD-10-CM | POA: Diagnosis not present

## 2015-01-19 DIAGNOSIS — K8010 Calculus of gallbladder with chronic cholecystitis without obstruction: Secondary | ICD-10-CM | POA: Insufficient documentation

## 2015-01-19 HISTORY — DX: Adverse effect of other opioids, initial encounter: T40.2X5A

## 2015-01-19 HISTORY — DX: Reserved for inherently not codable concepts without codable children: IMO0001

## 2015-01-19 HISTORY — DX: Cardiac arrhythmia, unspecified: I49.9

## 2015-01-19 HISTORY — DX: Drug induced constipation: K59.03

## 2015-01-19 HISTORY — DX: Drug induced constipation: T40.2X5A

## 2015-01-19 LAB — URINE MICROSCOPIC-ADD ON

## 2015-01-19 LAB — CBC WITH DIFFERENTIAL/PLATELET
BASOS ABS: 0 10*3/uL (ref 0.0–0.1)
Basophils Relative: 0 % (ref 0–1)
EOS PCT: 1 % (ref 0–5)
Eosinophils Absolute: 0 10*3/uL (ref 0.0–0.7)
HCT: 39 % (ref 36.0–46.0)
Hemoglobin: 13.4 g/dL (ref 12.0–15.0)
Lymphocytes Relative: 28 % (ref 12–46)
Lymphs Abs: 2.3 10*3/uL (ref 0.7–4.0)
MCH: 30.8 pg (ref 26.0–34.0)
MCHC: 34.4 g/dL (ref 30.0–36.0)
MCV: 89.7 fL (ref 78.0–100.0)
Monocytes Absolute: 0.5 10*3/uL (ref 0.1–1.0)
Monocytes Relative: 6 % (ref 3–12)
NEUTROS PCT: 65 % (ref 43–77)
Neutro Abs: 5.3 10*3/uL (ref 1.7–7.7)
Platelets: 199 10*3/uL (ref 150–400)
RBC: 4.35 MIL/uL (ref 3.87–5.11)
RDW: 12.6 % (ref 11.5–15.5)
WBC: 8.2 10*3/uL (ref 4.0–10.5)

## 2015-01-19 LAB — URINALYSIS, ROUTINE W REFLEX MICROSCOPIC
Bilirubin Urine: NEGATIVE
Glucose, UA: NEGATIVE mg/dL
Ketones, ur: NEGATIVE mg/dL
Leukocytes, UA: NEGATIVE
NITRITE: NEGATIVE
PH: 7.5 (ref 5.0–8.0)
Protein, ur: NEGATIVE mg/dL
Specific Gravity, Urine: 1.014 (ref 1.005–1.030)
Urobilinogen, UA: 1 mg/dL (ref 0.0–1.0)

## 2015-01-19 LAB — COMPREHENSIVE METABOLIC PANEL
ALT: 21 U/L (ref 0–35)
AST: 31 U/L (ref 0–37)
Albumin: 4 g/dL (ref 3.5–5.2)
Alkaline Phosphatase: 62 U/L (ref 39–117)
Anion gap: 6 (ref 5–15)
BILIRUBIN TOTAL: 0.4 mg/dL (ref 0.3–1.2)
BUN: 11 mg/dL (ref 6–23)
CHLORIDE: 108 mmol/L (ref 96–112)
CO2: 28 mmol/L (ref 19–32)
Calcium: 10 mg/dL (ref 8.4–10.5)
Creatinine, Ser: 1.12 mg/dL — ABNORMAL HIGH (ref 0.50–1.10)
GFR calc Af Amer: 73 mL/min — ABNORMAL LOW (ref >90)
GFR, EST NON AFRICAN AMERICAN: 63 mL/min — AB (ref >90)
Glucose, Bld: 82 mg/dL (ref 70–99)
Potassium: 4.2 mmol/L (ref 3.5–5.1)
SODIUM: 142 mmol/L (ref 135–145)
Total Protein: 6.6 g/dL (ref 6.0–8.3)

## 2015-01-19 LAB — HCG, SERUM, QUALITATIVE: PREG SERUM: NEGATIVE

## 2015-01-19 NOTE — Pre-Procedure Instructions (Signed)
Raven [name]  01/19/2015   Your procedure is scheduled on:  Tuesday, January 26th  Report to [facility] Admitting at 1130 AM.  Call this number if you have problems the morning of surgery: [phone]   Remember:   Do not eat food or drink liquids after midnight.   Take these medicines the morning of surgery with A SIP OF WATER: pepcid, albuterol if needed and please bring Inhaler tio the hospital with you, klonopin if needed   Do not wear jewelry, make-up or nail polish.  Do not wear lotions, powders, or perfumes,deodorant.  Do not shave 48 hours prior to surgery.  Do not bring valuables to the hospital.             [facility] is not responsible for any belongings or valuables.               Contacts, dentures or bridgework may not be worn into surgery.  Leave suitcase in the car. After surgery it may be brought to your room.  For patients admitted to the hospital, discharge time is determined by your  treatment team.               Patients discharged the day of surgery will not be allowed to drive home.  Please read over the following fact sheets that you were given: Pain Booklet, Coughing and Deep Breathing and Surgical Site Infection Prevention , [place] - Preparing for Surgery.

## 2015-01-19 NOTE — Telephone Encounter (Signed)
"  Raven [name]" from Pre Admit at [facility] has called today. Patient is scheduled for gallbladder surgery on Tuesday and is there for her pre op visit. She was cleared by Dr. Caryl Comes.   He had started her on Losartan at his visit earlier this month.   She has not been able to get this medicine yet due to the processing required by Medicaid.   BP is currently fine and she is asymptomatic.   I do not see how this would interfere with her upcoming surgery and would continue as planned but a prior auth was done on 01/18/15 and was improved.   Has been instructed to pick this up if possible and start as previously advised with BMET in 2 weeks following start date.  Burtis Junes, RN, [facility] [address] [place], [place]  [address] [phone]

## 2015-01-19 NOTE — Pre-Procedure Instructions (Addendum)
Raven [name]  01/19/2015   Your procedure is scheduled on:  Tuesday, January 26th  Report to [facility] Admitting at 1130 AM.  Call this number if you have problems the morning of surgery: [phone]   Remember:   Do not eat food or drink liquids after midnight.   Take these medicines the morning of surgery with A SIP OF WATER: pepcid, albuterol if needed and please bring Inhaler tio the hospital with you, Percocet if needed.                 Stop taking Aspirin, Coumadin, Plavix, Effient and Herbal medications.  Do not take any NSAIDs ie: Ibuprofen,  Advil,Naproxen or any medication containing Aspirin.   Do not wear jewelry, make-up or nail polish.  Do not wear lotions, powders, or perfumes,deodorant.  Do not shave 48 hours prior to surgery.  Do not bring valuables to the hospital.             [facility] is not responsible for any belongings or valuables.               Contacts, dentures or bridgework may not be worn into surgery.  Leave suitcase in the car. After surgery it may be brought to your room.  For patients admitted to the hospital, discharge time is determined by your  treatment team.               Patients discharged the day of surgery will not be allowed to drive home.  Please read over the following fact sheets that you were given: Pain Booklet, Coughing and Deep Breathing and Surgical Site Infection Prevention , [place] - Preparing for Surgery.

## 2015-01-21 NOTE — H&P (Signed)
Raven [name]. Raven [name]  Location: [facility] Patient #: [id number] DOB: 08/01/1980 Single / Language: Cleophus Molt / Race: Black or African American Female       History of Present Illness  Patient words: gallbladder.  The patient is a 34 year old female who presents for evaluation of gall stones. This is a 34 year old [place] American female, reportedly referred by Carmin Muskrat, M.D. at the current emergency Department for symptomatically gallstones. Dr. Christinia Gully is her pulmonologist. Dr. Virl Axe is her cardiologist. Dr. Geralynn Rile Seen Her for Esophageal Strictures and GI Disorders. She States That She Was Seen in the Emergency Room on August 2015 and Was Told She Had Colitis but She Was Also Told Her X-Ray Showed Gallstones. She Was Seen in the [facility] Emergent Department on January 02, 2015 with Right Upper Quadrant Pain, Nausea, Occasional Vomiting. The Pain Has Been Recurrent and Frequent and Is Postprandial. No Diarrhea or Fever. CT Scan January 02, 2015 Shows Multiple Gallstones but No Inflammatory Changes. Lab work at that time showed normal liver function tests, normal lipase, normal CBC. Comorbidities include hypertension, mild cardiomyopathy, history of PVCs, depression, esophageal stricture, HSV 2 infection, anxiety, history of blood transfusion, asthma, ongoing tobacco abuse. Surgical history reveals right breast biopsy, bladder suspension, laparoscopic tubal ligation, cystoscopy. Family history shows sister had cholecystectomy Socially she has 2 children. She says she has a boyfriend and is engaged. She works in a Production designer, theatre/television/film. She is in no significant distress today. At the end of our encounter she wants to go ahead and schedule for cholecystectomy. I have discussed the indications, details, techniques, and numerous risk of surgery with her. She is aware the risk of bleeding, infection, bile leak, conversion to open laparotomy, injury to  adjacent organs with major reconstructive surgery, wound hernia, cardiac, pulmonary, and thromboembolic problems. She understands these issues well. This time all of her questions were answered. She agrees with this plan. She states that she has an appointment to see Dr. Jolyn Nap on January 14, which will be convenient and can suffice for her cardiac clearance hopefully.   Other Problems  Bladder Problems Gastroesophageal Reflux Disease Transfusion history  Past Surgical History Breast Biopsy Right. Colon Polyp Removal - Colonoscopy Hemorrhoidectomy Tonsillectomy  Diagnostic Studies History  Mammogram >3 years ago  Allergies  No Known Drug Allergies01/06/2015  Medication History Famotidine (20MG  Tablet, Oral) Active. ClonazePAM (1MG  Tablet, Oral) Active.  Social History  Alcohol use Occasional alcohol use. No drug use Tobacco use Current some day smoker.  Family History Arthritis Mother. Bleeding disorder Sister. Cerebrovascular Accident Mother. Heart Disease Mother. Heart disease in female family member before age 64 Thyroid problems Mother.  Pregnancy / Birth History  Age at menarche 42 years. Contraceptive History Contraceptive implant, Depo-provera. Gravida 4 Para 2  Review of Systems  General Present- Chills and Fatigue. Not Present- Appetite Loss, Fever, Night Sweats, Weight Gain and Weight Loss. Respiratory Present- Snoring. Not Present- Bloody sputum, Chronic Cough, Difficulty Breathing and Wheezing. Gastrointestinal Present- Abdominal Pain. Not Present- Bloating, Bloody Stool, Change in Bowel Habits, Chronic diarrhea, Constipation, Difficulty Swallowing, Excessive gas, Gets full quickly at meals, Hemorrhoids, Indigestion, Nausea, Rectal Pain and Vomiting. Female Genitourinary Present- Frequency. Not Present- Nocturia, Painful Urination, Pelvic Pain and Urgency. Musculoskeletal Present- Joint Pain. Not Present- Back Pain, Joint  Stiffness, Muscle Pain, Muscle Weakness and Swelling of Extremities. Psychiatric Present- Anxiety. Not Present- Bipolar, Change in Sleep Pattern, Depression, Fearful and Frequent crying.   Vitals  Weight: 199 lb Height:  69in Body Surface Area: 2.1 m Body Mass Index: 29.39 kg/m Temp.: 58F(Temporal)  [name]: 76 (Regular)  BP: 126/80 (Sitting, Left Arm, Standard)    Physical Exam  General Mental Status-Alert. General Appearance-Consistent with stated age. Hydration-Well hydrated. Voice-Normal.  Head and Neck Head-normocephalic, atraumatic with no lesions or palpable masses. Trachea-midline. Thyroid Gland Characteristics - normal size and consistency.  Eye Eyeball - Bilateral-Extraocular movements intact. Sclera/Conjunctiva - Bilateral-No scleral icterus.  Chest and Lung Exam Chest and lung exam reveals -quiet, even and easy respiratory effort with no use of accessory muscles and on auscultation, normal breath sounds, no adventitious sounds and normal vocal resonance. Inspection Chest Wall - Normal. Back - normal.  Cardiovascular Cardiovascular examination reveals -normal heart sounds, regular rate and rhythm with no murmurs and normal pedal pulses bilaterally.  Abdomen Inspection Inspection of the abdomen reveals - No Hernias. Palpation/Percussion Palpation and Percussion of the abdomen reveal - Soft, Non Tender, No Rebound tenderness, No Rigidity (guarding) and No hepatosplenomegaly. Auscultation Auscultation of the abdomen reveals - Bowel sounds normal. Note: Small scar at lower rim of umbilicus. Jewelry at umbilicus. Abdomen basically soft. Subjectively slightly tender in the right upper quadrant but objectively benign.   Neurologic Neurologic evaluation reveals -alert and oriented x 3 with no impairment of recent or remote memory. Mental Status-Normal.  Neuropsychiatric Note: Very talkative.  Anxious.   Musculoskeletal Normal Exam - Left-Upper Extremity Strength Normal and Lower Extremity Strength Normal. Normal Exam - Right-Upper Extremity Strength Normal and Lower Extremity Strength Normal.  Lymphatic Head & Neck  General Head & Neck Lymphatics: Bilateral - Description - Normal. Axillary  General Axillary Region: Bilateral - Description - Normal. Tenderness - Non Tender. Femoral & Inguinal  Generalized Femoral & Inguinal Lymphatics: Bilateral - Description - Normal. Tenderness - Non Tender.    Assessment & Plan  CALCULUS OF GALLBLADDER WITH CHRONIC CHOLECYSTITIS WITH OBSTRUCTION (574.11  K80.11)   Schedule for Surgery You have gallstones, and you are having frequent gallbladder attacks. This will continue until you have a cholecystectomy. You will be scheduled for laparoscopic cholecystectomy with cholangiogram, possible open cholecystectomy Follow a very low-fat diet. We will ask Dr. Jolyn Nap for cardiac clearance prior to the surgery. If the surgery goes well, you will be able to go home the same day We have discussed the techniques and risks of this surgery in detail Please read the patient information booklet that I gave you.    CARDIOMYOPATHY (425.4  I42.9)  ESOPHAGEAL STRICTURE (530.3  K22.2) Impression: Followed by Dr. Erskine Emery and [facility] gastroenterology.  DEPRESSION, CONTROLLED (311  F32.9)  TOBACCO ABUSE (305.1  Z72.0) Impression: Encouraged to stop smoking  HSV-2 (HERPES SIMPLEX VIRUS 2) INFECTION (054.9  B00.9)  CHRONIC ASTHMA, UNSPECIFIED ASTHMA SEVERITY, UNCOMPLICATED (751.02  H85.277) Impression: Followed by Dr. Christinia Gully.  HISTORY OF BILATERAL TUBAL LIGATION (V26.51  Z98.51)    Edsel Petrin. Dalbert Batman, M.D., [facility] General and Minimally invasive Surgery Breast and Colorectal Surgery Office:   [phone] Pager:   [phone]

## 2015-01-22 MED ORDER — CHLORHEXIDINE GLUCONATE 4 % EX LIQD
1.0000 "application " | Freq: Once | CUTANEOUS | Status: DC
  Filled 2015-01-22: qty 15

## 2015-01-22 MED ORDER — CEFAZOLIN SODIUM-DEXTROSE 2-3 GM-% IV SOLR
2.0000 g | INTRAVENOUS | Status: AC
  Administered 2015-01-23: 2 g via INTRAVENOUS
  Filled 2015-01-22: qty 50

## 2015-01-23 ENCOUNTER — Observation Stay (HOSPITAL_COMMUNITY)
Admission: RE | Admit: 2015-01-23 | Discharge: 2015-01-24 | Disposition: A | Discharge status: Home / Self Care | Payer: Medicaid Other | Source: Ambulatory Visit | Attending: General Surgery | Admitting: General Surgery

## 2015-01-23 ENCOUNTER — Ambulatory Visit (HOSPITAL_COMMUNITY): Payer: Medicaid Other | Admitting: Anesthesiology

## 2015-01-23 ENCOUNTER — Encounter (HOSPITAL_COMMUNITY): Payer: Self-pay | Admitting: Certified Registered Nurse Anesthetist

## 2015-01-23 ENCOUNTER — Encounter (HOSPITAL_COMMUNITY)
Admission: RE | Disposition: A | Discharge status: Home / Self Care | Payer: Self-pay | Source: Ambulatory Visit | Attending: General Surgery

## 2015-01-23 DIAGNOSIS — Z9889 Other specified postprocedural states: Secondary | ICD-10-CM

## 2015-01-23 DIAGNOSIS — K219 Gastro-esophageal reflux disease without esophagitis: Secondary | ICD-10-CM | POA: Insufficient documentation

## 2015-01-23 DIAGNOSIS — K222 Esophageal obstruction: Secondary | ICD-10-CM | POA: Diagnosis not present

## 2015-01-23 DIAGNOSIS — J45909 Unspecified asthma, uncomplicated: Secondary | ICD-10-CM | POA: Insufficient documentation

## 2015-01-23 DIAGNOSIS — Z79899 Other long term (current) drug therapy: Secondary | ICD-10-CM | POA: Insufficient documentation

## 2015-01-23 DIAGNOSIS — K8010 Calculus of gallbladder with chronic cholecystitis without obstruction: Principal | ICD-10-CM | POA: Insufficient documentation

## 2015-01-23 DIAGNOSIS — I429 Cardiomyopathy, unspecified: Secondary | ICD-10-CM | POA: Insufficient documentation

## 2015-01-23 DIAGNOSIS — I10 Essential (primary) hypertension: Secondary | ICD-10-CM | POA: Insufficient documentation

## 2015-01-23 DIAGNOSIS — F329 Major depressive disorder, single episode, unspecified: Secondary | ICD-10-CM | POA: Diagnosis not present

## 2015-01-23 DIAGNOSIS — K8000 Calculus of gallbladder with acute cholecystitis without obstruction: Secondary | ICD-10-CM

## 2015-01-23 DIAGNOSIS — B009 Herpesviral infection, unspecified: Secondary | ICD-10-CM | POA: Insufficient documentation

## 2015-01-23 DIAGNOSIS — F419 Anxiety disorder, unspecified: Secondary | ICD-10-CM | POA: Insufficient documentation

## 2015-01-23 DIAGNOSIS — F17210 Nicotine dependence, cigarettes, uncomplicated: Secondary | ICD-10-CM | POA: Insufficient documentation

## 2015-01-23 HISTORY — PX: CHOLECYSTECTOMY: SHX55

## 2015-01-23 HISTORY — DX: Calculus of gallbladder with acute cholecystitis without obstruction: K80.00

## 2015-01-23 HISTORY — DX: Other specified postprocedural states: Z98.890

## 2015-01-23 SURGERY — LAPAROSCOPIC CHOLECYSTECTOMY WITH INTRAOPERATIVE CHOLANGIOGRAM
Anesthesia: General | Site: Abdomen

## 2015-01-23 MED ORDER — LOSARTAN POTASSIUM 25 MG PO TABS
25.0000 mg | ORAL_TABLET | Freq: Every day | ORAL | Status: DC
  Filled 2015-01-23: qty 1

## 2015-01-23 MED ORDER — ALBUTEROL SULFATE (2.5 MG/3ML) 0.083% IN NEBU: 2.5000 mg | INHALATION_SOLUTION | Freq: Four times a day (QID) | RESPIRATORY_TRACT | Status: DC | PRN

## 2015-01-23 MED ORDER — PROMETHAZINE HCL 25 MG/ML IJ SOLN
INTRAMUSCULAR | Status: AC
Start: 2015-01-23 — End: 2015-01-24
  Filled 2015-01-23: qty 1

## 2015-01-23 MED ORDER — LACTATED RINGERS IV SOLN
INTRAVENOUS | Status: DC | PRN
  Administered 2015-01-23 (×2): via INTRAVENOUS

## 2015-01-23 MED ORDER — OXYCODONE HCL 5 MG PO TABS
5.0000 mg | ORAL_TABLET | ORAL | Status: DC | PRN
  Administered 2015-01-23 – 2015-01-24 (×2): 10 mg via ORAL
  Administered 2015-01-24: 5 mg via ORAL
  Filled 2015-01-23 (×3): qty 2

## 2015-01-23 MED ORDER — ACETAMINOPHEN 325 MG PO TABS: 650.0000 mg | ORAL_TABLET | ORAL | Status: DC | PRN

## 2015-01-23 MED ORDER — NEOSTIGMINE METHYLSULFATE 10 MG/10ML IV SOLN
INTRAVENOUS | Status: DC | PRN
  Administered 2015-01-23: 5 mg via INTRAVENOUS

## 2015-01-23 MED ORDER — PROMETHAZINE HCL 25 MG PO TABS: 25.0000 mg | ORAL_TABLET | ORAL | Status: DC | PRN

## 2015-01-23 MED ORDER — FENTANYL CITRATE 0.05 MG/ML IJ SOLN
INTRAMUSCULAR | Status: AC
  Filled 2015-01-23: qty 5

## 2015-01-23 MED ORDER — PROMETHAZINE HCL 25 MG/ML IJ SOLN
6.2500 mg | INTRAMUSCULAR | Status: DC | PRN
Start: 2015-01-23 — End: 2015-01-23
  Administered 2015-01-23: 12.5 mg via INTRAVENOUS

## 2015-01-23 MED ORDER — PROPOFOL 10 MG/ML IV BOLUS
INTRAVENOUS | Status: DC | PRN
  Administered 2015-01-23: 200 mg via INTRAVENOUS

## 2015-01-23 MED ORDER — ONDANSETRON HCL 4 MG/2ML IJ SOLN
INTRAMUSCULAR | Status: DC | PRN
  Administered 2015-01-23: 4 mg via INTRAVENOUS

## 2015-01-23 MED ORDER — DEXAMETHASONE SODIUM PHOSPHATE 4 MG/ML IJ SOLN
INTRAMUSCULAR | Status: DC | PRN
Start: 2015-01-23 — End: 2015-01-23
  Administered 2015-01-23: 4 mg via INTRAVENOUS

## 2015-01-23 MED ORDER — FENTANYL CITRATE 0.05 MG/ML IJ SOLN
25.0000 ug | INTRAMUSCULAR | Status: DC | PRN
  Administered 2015-01-23 – 2015-01-24 (×2): 50 ug via INTRAVENOUS
  Filled 2015-01-23 (×2): qty 2

## 2015-01-23 MED ORDER — ENOXAPARIN SODIUM 30 MG/0.3ML ~~LOC~~ SOLN
30.0000 mg | Freq: Every day | SUBCUTANEOUS | Status: DC
  Filled 2015-01-23: qty 0.3

## 2015-01-23 MED ORDER — SODIUM CHLORIDE 0.9 % IJ SOLN: 3.0000 mL | Freq: Two times a day (BID) | INTRAMUSCULAR | Status: DC

## 2015-01-23 MED ORDER — PROPOFOL 10 MG/ML IV BOLUS
INTRAVENOUS | Status: AC
  Filled 2015-01-23: qty 20

## 2015-01-23 MED ORDER — NEOSTIGMINE METHYLSULFATE 10 MG/10ML IV SOLN
INTRAVENOUS | Status: AC
  Filled 2015-01-23: qty 1

## 2015-01-23 MED ORDER — FENTANYL CITRATE 0.05 MG/ML IJ SOLN
INTRAMUSCULAR | Status: AC
  Filled 2015-01-23: qty 2

## 2015-01-23 MED ORDER — BUPIVACAINE-EPINEPHRINE 0.25% -1:200000 IJ SOLN
INTRAMUSCULAR | Status: DC | PRN
  Administered 2015-01-23: 8 mL

## 2015-01-23 MED ORDER — OXYCODONE HCL 5 MG/5ML PO SOLN
ORAL | Status: AC
  Administered 2015-01-23: 10 mg
  Filled 2015-01-23: qty 10

## 2015-01-23 MED ORDER — ONDANSETRON HCL 4 MG/2ML IJ SOLN
INTRAMUSCULAR | Status: AC
  Filled 2015-01-23: qty 2

## 2015-01-23 MED ORDER — LIDOCAINE HCL (CARDIAC) 20 MG/ML IV SOLN
INTRAVENOUS | Status: AC
Start: 2015-01-23 — End: 2015-01-23
  Filled 2015-01-23: qty 5

## 2015-01-23 MED ORDER — SODIUM CHLORIDE 0.9 % IJ SOLN: 3.0000 mL | INTRAMUSCULAR | Status: DC | PRN

## 2015-01-23 MED ORDER — BUPIVACAINE-EPINEPHRINE (PF) 0.25% -1:200000 IJ SOLN
INTRAMUSCULAR | Status: AC
  Filled 2015-01-23: qty 30

## 2015-01-23 MED ORDER — HYDROMORPHONE HCL 1 MG/ML IJ SOLN: 1.0000 mg | INTRAMUSCULAR | Status: DC | PRN

## 2015-01-23 MED ORDER — POTASSIUM CHLORIDE IN NACL 20-0.9 MEQ/L-% IV SOLN
INTRAVENOUS | Status: DC
  Administered 2015-01-23: 19:00:00 via INTRAVENOUS
  Filled 2015-01-23 (×3): qty 1000

## 2015-01-23 MED ORDER — LACTATED RINGERS IV SOLN
INTRAVENOUS | Status: DC
  Administered 2015-01-23: 12:00:00 via INTRAVENOUS

## 2015-01-23 MED ORDER — 0.9 % SODIUM CHLORIDE (POUR BTL) OPTIME
TOPICAL | Status: DC | PRN
  Administered 2015-01-23: 1000 mL

## 2015-01-23 MED ORDER — FENTANYL CITRATE 0.05 MG/ML IJ SOLN
25.0000 ug | INTRAMUSCULAR | Status: DC | PRN
  Administered 2015-01-23 (×3): 25 ug via INTRAVENOUS
  Administered 2015-01-23: 50 ug via INTRAVENOUS
  Administered 2015-01-23: 25 ug via INTRAVENOUS

## 2015-01-23 MED ORDER — FAMOTIDINE 20 MG PO TABS
20.0000 mg | ORAL_TABLET | Freq: Two times a day (BID) | ORAL | Status: DC
  Administered 2015-01-23: 20 mg via ORAL
  Filled 2015-01-23 (×3): qty 1

## 2015-01-23 MED ORDER — ROCURONIUM BROMIDE 50 MG/5ML IV SOLN
INTRAVENOUS | Status: AC
  Filled 2015-01-23: qty 1

## 2015-01-23 MED ORDER — GLYCOPYRROLATE 0.2 MG/ML IJ SOLN
INTRAMUSCULAR | Status: DC | PRN
  Administered 2015-01-23: .8 mg via INTRAVENOUS

## 2015-01-23 MED ORDER — ACETAMINOPHEN 650 MG RE SUPP: 650.0000 mg | RECTAL | Status: DC | PRN

## 2015-01-23 MED ORDER — GLYCOPYRROLATE 0.2 MG/ML IJ SOLN
INTRAMUSCULAR | Status: AC
  Filled 2015-01-23: qty 4

## 2015-01-23 MED ORDER — MIDAZOLAM HCL 2 MG/2ML IJ SOLN
INTRAMUSCULAR | Status: AC
  Filled 2015-01-23: qty 2

## 2015-01-23 MED ORDER — HYDROCODONE-ACETAMINOPHEN 5-325 MG PO TABS: 1.0000 | ORAL_TABLET | Freq: Four times a day (QID) | ORAL | Status: DC | PRN

## 2015-01-23 MED ORDER — FENTANYL CITRATE 0.05 MG/ML IJ SOLN
INTRAMUSCULAR | Status: DC | PRN
  Administered 2015-01-23 (×2): 50 ug via INTRAVENOUS
  Administered 2015-01-23: 100 ug via INTRAVENOUS
  Administered 2015-01-23: 50 ug via INTRAVENOUS

## 2015-01-23 MED ORDER — OXYCODONE-ACETAMINOPHEN 5-325 MG PO TABS
1.0000 | ORAL_TABLET | ORAL | Status: DC | PRN
  Filled 2015-01-23: qty 2

## 2015-01-23 MED ORDER — SODIUM CHLORIDE 0.9 % IV SOLN
250.0000 mL | INTRAVENOUS | Status: DC | PRN
Start: 2015-01-23 — End: 2015-01-24

## 2015-01-23 MED ORDER — MEPERIDINE HCL 25 MG/ML IJ SOLN: 6.2500 mg | INTRAMUSCULAR | Status: DC | PRN

## 2015-01-23 MED ORDER — SODIUM CHLORIDE 0.9 % IV SOLN
INTRAVENOUS | Status: DC
  Administered 2015-01-23: 150 mL/h via INTRAVENOUS

## 2015-01-23 MED ORDER — ROCURONIUM BROMIDE 100 MG/10ML IV SOLN
INTRAVENOUS | Status: DC | PRN
  Administered 2015-01-23: 40 mg via INTRAVENOUS
  Administered 2015-01-23: 10 mg via INTRAVENOUS

## 2015-01-23 MED ORDER — CLONAZEPAM 1 MG PO TABS
1.0000 mg | ORAL_TABLET | Freq: Every evening | ORAL | Status: DC | PRN
  Administered 2015-01-23: 1 mg via ORAL
  Filled 2015-01-23: qty 1

## 2015-01-23 MED ORDER — PROMETHAZINE HCL 25 MG/ML IJ SOLN: 25.0000 mg | INTRAMUSCULAR | Status: DC | PRN

## 2015-01-23 MED ORDER — LIDOCAINE HCL (CARDIAC) 20 MG/ML IV SOLN
INTRAVENOUS | Status: DC | PRN
  Administered 2015-01-23: 60 mg via INTRAVENOUS

## 2015-01-23 MED ORDER — MIDAZOLAM HCL 5 MG/5ML IJ SOLN
INTRAMUSCULAR | Status: DC | PRN
  Administered 2015-01-23: 2 mg via INTRAVENOUS

## 2015-01-23 MED ORDER — SODIUM CHLORIDE 0.9 % IR SOLN
Status: DC | PRN
  Administered 2015-01-23: 1000 mL

## 2015-01-23 SURGICAL SUPPLY — 42 items
APPLIER CLIP ROT 10 11.4 M/L (STAPLE) ×3
BLADE SURG ROTATE 9660 (MISCELLANEOUS) IMPLANT
CANISTER SUCTION 2500CC (MISCELLANEOUS) ×3 IMPLANT
CHLORAPREP W/TINT 26ML (MISCELLANEOUS) ×3 IMPLANT
CLIP APPLIE ROT 10 11.4 M/L (STAPLE) ×1 IMPLANT
COVER MAYO STAND STRL (DRAPES) ×3 IMPLANT
COVER SURGICAL LIGHT HANDLE (MISCELLANEOUS) ×3 IMPLANT
DRAPE C-ARM 42X72 X-RAY (DRAPES) ×3 IMPLANT
DRAPE LAPAROSCOPIC ABDOMINAL (DRAPES) ×3 IMPLANT
ELECT REM PT RETURN 9FT ADLT (ELECTROSURGICAL) ×3
ELECTRODE REM PT RTRN 9FT ADLT (ELECTROSURGICAL) ×1 IMPLANT
GLOVE BIO SURGEON STRL SZ7 (GLOVE) ×3 IMPLANT
GLOVE BIO SURGEON STRL SZ7.5 (GLOVE) ×3 IMPLANT
GLOVE BIOGEL PI IND STRL 6.5 (GLOVE) ×1 IMPLANT
GLOVE BIOGEL PI IND STRL 7.0 (GLOVE) ×2 IMPLANT
GLOVE BIOGEL PI INDICATOR 6.5 (GLOVE) ×2
GLOVE BIOGEL PI INDICATOR 7.0 (GLOVE) ×4
GLOVE EUDERMIC 7 POWDERFREE (GLOVE) ×3 IMPLANT
GLOVE SURG SS PI 7.0 STRL IVOR (GLOVE) ×3 IMPLANT
GOWN STRL REUS W/ TWL LRG LVL3 (GOWN DISPOSABLE) ×2 IMPLANT
GOWN STRL REUS W/ TWL XL LVL3 (GOWN DISPOSABLE) ×2 IMPLANT
GOWN STRL REUS W/TWL LRG LVL3 (GOWN DISPOSABLE) ×4
GOWN STRL REUS W/TWL XL LVL3 (GOWN DISPOSABLE) ×4
KIT BASIN OR (CUSTOM PROCEDURE TRAY) ×3 IMPLANT
KIT ROOM TURNOVER OR (KITS) ×3 IMPLANT
LIQUID BAND (GAUZE/BANDAGES/DRESSINGS) ×3 IMPLANT
NS IRRIG 1000ML POUR BTL (IV SOLUTION) ×3 IMPLANT
PAD ARMBOARD 7.5X6 YLW CONV (MISCELLANEOUS) ×3 IMPLANT
POUCH SPECIMEN RETRIEVAL 10MM (ENDOMECHANICALS) ×3 IMPLANT
SCISSORS LAP 5X35 DISP (ENDOMECHANICALS) ×3 IMPLANT
SET CHOLANGIOGRAPH 5 50 .035 (SET/KITS/TRAYS/PACK) ×3 IMPLANT
SET IRRIG TUBING LAPAROSCOPIC (IRRIGATION / IRRIGATOR) ×3 IMPLANT
SLEEVE ENDOPATH XCEL 5M (ENDOMECHANICALS) ×3 IMPLANT
SPECIMEN JAR SMALL (MISCELLANEOUS) ×3 IMPLANT
SUT MNCRL AB 4-0 PS2 18 (SUTURE) ×3 IMPLANT
TOWEL OR 17X24 6PK STRL BLUE (TOWEL DISPOSABLE) ×3 IMPLANT
TOWEL OR 17X26 10 PK STRL BLUE (TOWEL DISPOSABLE) ×3 IMPLANT
TRAY LAPAROSCOPIC (CUSTOM PROCEDURE TRAY) ×3 IMPLANT
TROCAR XCEL BLUNT TIP 100MML (ENDOMECHANICALS) ×3 IMPLANT
TROCAR XCEL NON-BLD 11X100MML (ENDOMECHANICALS) ×3 IMPLANT
TROCAR XCEL NON-BLD 5MMX100MML (ENDOMECHANICALS) ×3 IMPLANT
TUBING INSUFFLATION (TUBING) ×3 IMPLANT

## 2015-01-23 NOTE — Progress Notes (Signed)
Arrived to 6n03 from PACU, friend at bedside, sleepy but easily arousable, denies nausea, abd pain 4/10, oriented to room and surroundings

## 2015-01-23 NOTE — Anesthesia Procedure Notes (Signed)
Procedure Name: Intubation Date/Time: 01/23/2015 1:41 PM Performed by: Ollen Bowl Pre-anesthesia Checklist: Patient identified, Emergency Drugs available, Suction available, Patient being monitored and Timeout performed Patient Re-evaluated:Patient Re-evaluated prior to inductionOxygen Delivery Method: Circle system utilized and Simple face mask Preoxygenation: Pre-oxygenation with 100% oxygen Intubation Type: IV induction Ventilation: Mask ventilation without difficulty Laryngoscope Size: Mac and 4 Grade View: Grade I Tube type: Oral Tube size: 7.5 mm Number of attempts: 1 Airway Equipment and Method: Patient positioned with wedge pillow and Stylet Placement Confirmation: ETT inserted through vocal cords under direct vision,  positive ETCO2 and breath sounds checked- equal and bilateral Secured at: 22 cm Tube secured with: Tape Dental Injury: Teeth and Oropharynx as per pre-operative assessment

## 2015-01-23 NOTE — Progress Notes (Signed)
Patient wants to stay overnight due to pain management. Dr. Dalbert Batman notified. See new orders.

## 2015-01-23 NOTE — Transfer of Care (Signed)
Immediate Anesthesia Transfer of Care Note  Patient: Raven [name]  Procedure(s) Performed: Procedure(s) with comments: LAPAROSCOPIC CHOLECYSTECTOMY  (N/A) - case booked with cholangiogram but MD did not complete intraoperative cholangiogram   Patient Location: PACU  Anesthesia Type:General  Level of Consciousness: awake and alert   Airway & Oxygen Therapy: Patient Spontanous Breathing and Patient connected to nasal cannula oxygen  Post-op Assessment: Report given to PACU RN and Post -op Vital signs reviewed and stable  Post vital signs: Reviewed and stable  Complications: No apparent anesthesia complications

## 2015-01-23 NOTE — Anesthesia Preprocedure Evaluation (Addendum)
Anesthesia Evaluation  Patient identified by MRN, date of birth, ID band Patient awake    Reviewed: Allergy & Precautions, NPO status , Patient's Chart, lab work & pertinent test results, reviewed documented beta blocker date and time   Airway        Dental   Pulmonary asthma , Current Smoker,          Cardiovascular hypertension, Pt. on medications  Cardiomyopathy EF 45%   Neuro/Psych Anxiety Depression    GI/Hepatic Neg liver ROS, GERD-  Medicated,  Endo/Other  negative endocrine ROS  Renal/GU GFR 72     Musculoskeletal   Abdominal   Peds  Hematology negative hematology ROS (+) 13/39 H/H   Anesthesia Other Findings   Reproductive/Obstetrics                          Anesthesia Physical Anesthesia Plan  ASA: II  Anesthesia Plan: General   Post-op Pain Management:    Induction:   Airway Management Planned: Oral ETT  Additional Equipment:   Intra-op Plan:   Post-operative Plan: Extubation in OR  Informed Consent: I have reviewed the patients History and Physical, chart, labs and discussed the procedure including the risks, benefits and alternatives for the proposed anesthesia with the patient or authorized representative who has indicated his/her understanding and acceptance.     Plan Discussed with:   Anesthesia Plan Comments: (HCG negative)       Anesthesia Quick Evaluation

## 2015-01-23 NOTE — Op Note (Signed)
Patient Name:           Raven [name]   Date of Surgery:        01/23/2015  Pre op Diagnosis:      Chronic cholecystitis with cholelithiasis  Post op Diagnosis:    Chronic cholecystitis with cholelithiasis  Procedure:                 Laparoscopic cholecystectomy  Surgeon:                     Edsel Petrin. Dalbert Batman, M.D., FACS  Assistant:                      Ralene Ok, M.D., [facility]  Operative Indications:  This is a 34 year old [place] American female, reportedly referred by Carmin Muskrat, M.D. at the current emergency Department for symptomatically gallstones. Dr. Christinia Gully is her pulmonologist. Dr. Virl Axe is her cardiologist. Dr. Erskine Emery has Seen her for Esophageal Strictures and GI Disorders. She States That She Was Seen in the Emergency Room on August, 2015 and Was Told She Had Colitis but She Was Also Told Her X-Ray Showed Gallstones. She Was Seen in the [facility] Emergent Department on January 02, 2015 with Right Upper Quadrant Pain, Nausea, Occasional Vomiting. The Pain Has Been Recurrent and Frequent and Is Postprandial. No Diarrhea or Fever. CT Scan January 02, 2015 Shows Multiple Gallstones but No Inflammatory Changes. Lab work at that time showed normal liver function tests, normal lipase, normal CBC.    Comorbidities include hypertension, mild cardiomyopathy, history of PVCs, depression, esophageal stricture, HSV 2 infection, anxiety, history of blood transfusion, asthma, ongoing tobacco abuse. Surgical history reveals right breast biopsy, bladder suspension, laparoscopic tubal ligation, cystoscopy. Family history shows sister had cholecystectomy    She was evaluated electively in the office recently. She is brought to the operating room electively. Liver function tests were repeated and are still normal.  Operative Findings:       The gallbladder was chronically inflamed. There were extensive adhesions to the gallbladder which took some time to take down.  I was able to create a good critical view behind the cystic artery and cystic duct and so I chose not to do a cholangiogram. The liver looked healthy. The stomach and duodenum, spleen, small bowel and large bowel were grossly normal to inspection. There is no other disease process identified in the upper abdomen or lower abdomen.  Procedure in Detail:          Following the induction of general endotracheal anesthesia the patient's abdomen was prepped and draped in a sterile fashion, intravenous antibiotics were given, and a surgical timeout was performed. 0.5% Marcaine with epinephrine was used as a local infiltration anesthetic.     A transverse incision was made at the lower rim of the umbilicus, through a previous scar. Dissection was carried down to the fascia which was incised in the midline. I slowly dissected down to enter the peritoneal cavity under direct vision. An 11 mm Hassan trocar was inserted and secured the Purstring suture of 0 Vicryl. Pneumoperitoneum was created and video camera was inserted. An 11 mm trocar was placed in subxiphoid region and two 5 mm trochars in the right upper quadrant. The gallbladder fundus was easily identified and elevated. Slowly took down numerous adhesions to the body and infundibulum of the gallbladder until we had good visualization. I incised the peritoneum over the neck of the gallbladder and dissected  out a single cystic artery anteriorly and a single cystic duct with a large window behind them. I secured the cystic duct and the cystic artery with multiple metal clips and divided them. There appeared to be a small posterior branch of the cystic artery which I controlled with  metal clips and divided. The gallbladder was then dissected from its bed with electrocautery placed in a specimen bag and removed. A little bit of bile was spilled but no stones were spilled. The operative field and subphrenic space were copiously irrigated with saline. Irrigation fluid  became completely clear there was no bleeding or bile leak. The trocars were removed and the pneumoperitoneum was released. The fascia at the umbilicus was closed with 0 Vicryl sutures and skin incisions closed with subcuticular 4-0 Monocryl and Dermabond. Patient tolerated the procedure well was taken to PACU in stable condition. EBL 20 mL. Counts correct. Complications none.     Edsel Petrin. Dalbert Batman, M.D., FACS General and Minimally Invasive Surgery Breast and Colorectal Surgery  01/23/2015 2:40 PM

## 2015-01-23 NOTE — Interval H&P Note (Signed)
History and Physical Interval Note:  01/23/2015 11:29 AM  Raven [name]  has presented today for surgery, with the diagnosis of Gallstones  The various methods of treatment have been discussed with the patient and family. After consideration of risks, benefits and other options for treatment, the patient has consented to  Procedure(s): LAPAROSCOPIC CHOLECYSTECTOMY WITH INTRAOPERATIVE CHOLANGIOGRAM POSSIBLE OPEN (N/A) as a surgical intervention .  The patient's history has been reviewed, patient examined, no change in status, stable for surgery.  I have reviewed the patient's chart and labs.  Questions were answered to the patient's satisfaction.     [name]

## 2015-01-24 DIAGNOSIS — K8010 Calculus of gallbladder with chronic cholecystitis without obstruction: Secondary | ICD-10-CM | POA: Diagnosis not present

## 2015-01-24 MED ORDER — OXYCODONE-ACETAMINOPHEN 5-325 MG PO TABS: 1.0000 | ORAL_TABLET | ORAL | Status: DC | PRN

## 2015-01-24 NOTE — Anesthesia Postprocedure Evaluation (Signed)
  Anesthesia Post-op Note  Patient: Raven [name]  Procedure(s) Performed: Procedure(s) with comments: LAPAROSCOPIC CHOLECYSTECTOMY  (N/A) - case booked with cholangiogram but MD did not complete intraoperative cholangiogram   Patient Location: PACU  Anesthesia Type:General  Level of Consciousness: awake and alert   Airway and Oxygen Therapy: Patient Spontanous Breathing  Post-op Pain: moderate  Post-op Assessment: Post-op Vital signs reviewed, Patient's Cardiovascular Status Stable and Respiratory Function Stable  Post-op Vital Signs: Reviewed  Filed Vitals:   01/24/15 0635  BP: 157/74  Pulse: 68  Temp: 36.8 C  Resp: 16    Complications: No apparent anesthesia complications

## 2015-01-24 NOTE — Progress Notes (Signed)
Verbally understood DC instructions, no questions asked,  friend at bedside

## 2015-01-24 NOTE — Discharge Summary (Signed)
Patient ID: Raven [name] [id number] 34 y.o. 08/01/1980  Admit date: 01/23/2015  Discharge date and time: 01/24/2014  Admitting Physician: [name]  Discharge Physician: [name]  Admission Diagnoses: Gallstones   Discharge Diagnoses: Chronic cholecystitis with cholelithiasis Cardiomyopathy   history of esophageal stricture Depression, controlled Ongoing tobacco abuse HSV-2 infection Chronic asthma, unspecified History of bilateral tubal ligation  Operations: Procedure(s): LAPAROSCOPIC CHOLECYSTECTOMY   Admission Condition: good  Discharged Condition: good  Indication for Admission: This is a 34 year old [place] American female, referred by Carmin Muskrat, M.D. at the  emergency Department for symptomatically gallstones. Dr. Christinia Gully is her pulmonologist. Dr. Virl Axe is her cardiologist. Dr. Erskine Emery has seen her for Esophageal Strictures and GI Disorders. She States That She Was Seen in the Emergency Room on August, 2015 and Was Told She Had Colitis but She Was Also Told Her X-Ray Showed Gallstones. She Was Seen in the [facility] Emergency Department on January 02, 2015 with Right Upper Quadrant Pain, Nausea, Occasional Vomiting. The Pain Has Been Recurrent and Frequent and Is Postprandial. No Diarrhea or Fever. CT Scan January 02, 2015 Shows Multiple Gallstones but No Inflammatory Changes. Lab work at that time showed normal liver function tests, normal lipase, normal CBC.   She has numerous comorbidities listed above.Marland Kitchen   She was evaluated electively in the office recently. She is brought to the operating room electively. Liver function tests were repeated and are still normal.  Hospital Course: On the day of admission the patient was taken to the operating room and underwent laparoscopic cholecystectomy. The surgery was uneventful. There were extensive adhesions to the gallbladder. No other abnormalities were noted in the abdomen or  pelvis. Postoperatively she was stable but said that she was having too much pain to go home so we elected to admit her overnight for observation. She remained stable with normal vital signs. Was able to ambulate to the bathroom and void. No nausea or vomiting. On postop day 1 she was awake and alert. Afebrile. Heart rate 76. Respirations unlabored. BP 122/70. SPO2 98% on room air. Said her incisions were sore and had a little bit of right shoulder pain. Examination revealed her abdomen was soft. Nondistended. Bowel sounds were active. Wounds looked good. A benign postop exam.    She was advised that she was stable for discharge and she wanted to go home. She was given a prescription for Percocet for pain. She was instructed in diet and activities. She was told to continue all of her usual medications. She was asked to return to see me in the office in 3 weeks.  Consults: None  Significant Diagnostic Studies: None  Treatments: surgery: Laparoscopic cholecystectomy  Disposition: Home  Patient Instructions:    Medication List    STOP taking these medications        senna-docusate 8.6-50 MG per tablet  Commonly known as:  Senokot-S      TAKE these medications        albuterol 108 (90 BASE) MCG/ACT inhaler  Commonly known as:  PROVENTIL HFA;VENTOLIN HFA  Inhale 2 puffs into the lungs every 6 (six) hours as needed for wheezing or shortness of breath.     clonazePAM 1 MG tablet  Commonly known as:  KLONOPIN  Take 1 mg by mouth at bedtime as needed. For anxiety     EPIPEN 0.3 mg/0.3 mL Soaj injection  Generic drug:  EPINEPHrine  Inject 0.3 mg into the muscle once as needed (for allergies).  famotidine 20 MG tablet  Commonly known as:  PEPCID  Take 20 mg by mouth 2 (two) times daily.     HYDROcodone-acetaminophen 5-325 MG per tablet  Commonly known as:  NORCO  Take 1-2 tablets by mouth every 6 (six) hours as needed for moderate pain or severe pain.     losartan 25 MG tablet   Commonly known as:  COZAAR  Take 1 tablet (25 mg total) by mouth daily.     METROGEL VAGINAL 0.75 % vaginal gel  Generic drug:  metroNIDAZOLE  Place 0.14 application vaginally as needed.     oxyCODONE-acetaminophen 5-325 MG per tablet  Commonly known as:  PERCOCET/ROXICET  Take 1-2 tablets by mouth every 4 (four) hours as needed for moderate pain.        Activity: activity as tolerated Diet: low fat, low cholesterol diet Wound Care: none needed  Follow-up:  With Dr. Dalbert Batman in 3 weeks.  Signed: Edsel Petrin. Dalbert Batman, M.D., FACS General and minimally invasive surgery Breast and Colorectal Surgery  01/24/2015, 5:30 AM

## 2015-01-24 NOTE — Discharge Instructions (Signed)
[facility] ______[facility] °LAPAROSCOPIC SURGERY: POST OP INSTRUCTIONS °Always review your discharge instruction sheet given to you by the facility where your surgery was performed. °IF YOU HAVE DISABILITY OR FAMILY LEAVE FORMS, YOU MUST BRING THEM TO THE OFFICE FOR PROCESSING.   °DO NOT GIVE THEM TO YOUR DOCTOR. ° °1. A prescription for pain medication may be given to you upon discharge.  Take your pain medication as prescribed, if needed.  If narcotic pain medicine is not needed, then you may take acetaminophen (Tylenol) or ibuprofen (Advil) as needed. °2. Take your usually prescribed medications unless otherwise directed. °3. If you need a refill on your pain medication, please contact your pharmacy.  They will contact our office to request authorization. Prescriptions will not be filled after 5pm or on week-ends. °4. You should follow a light diet the first few days after arrival home, such as soup and crackers, etc.  Be sure to include lots of fluids daily. °5. Most patients will experience some swelling and bruising in the area of the incisions.  Ice packs will help.  Swelling and bruising can take several days to resolve.  °6. It is common to experience some constipation if taking pain medication after surgery.  Increasing fluid intake and taking a stool softener (such as Colace) will usually help or prevent this problem from occurring.  A mild laxative (Milk of Magnesia or Miralax) should be taken according to package instructions if there are no bowel movements after 48 hours. °7. Unless discharge instructions indicate otherwise, you may remove your bandages 24-48 hours after surgery, and you may shower at that time.  You may have steri-strips (small skin tapes) in place directly over the incision.  These strips should be left on the skin for 7-10 days.  If your surgeon used skin glue on the incision, you may shower in 24 hours.  The glue will flake off over the next 2-3 weeks.  Any sutures or  staples will be removed at the office during your follow-up visit. °8. ACTIVITIES:  You may resume regular (light) daily activities beginning the next day--such as daily self-care, walking, climbing stairs--gradually increasing activities as tolerated.  You may have sexual intercourse when it is comfortable.  Refrain from any heavy lifting or straining until approved by your doctor. °a. You may drive when you are no longer taking prescription pain medication, you can comfortably wear a seatbelt, and you can safely maneuver your car and apply brakes. °b. RETURN TO WORK:  __________________________________________________________ °9. You should see your doctor in the office for a follow-up appointment approximately 2-3 weeks after your surgery.  Make sure that you call for this appointment within a day or two after you arrive home to insure a convenient appointment time. °10. OTHER INSTRUCTIONS: __________________________________________________________________________________________________________________________ __________________________________________________________________________________________________________________________ °WHEN TO CALL YOUR DOCTOR: °1. Fever over 101.0 °2. Inability to urinate °3. Continued bleeding from incision. °4. Increased pain, redness, or drainage from the incision. °5. Increasing abdominal pain ° °The clinic staff is available to answer your questions during regular business hours.  Please don’t hesitate to call and ask to speak to one of the nurses for clinical concerns.  If you have a medical emergency, go to the nearest emergency room or call 911.  A surgeon from [facility] is always on call at the hospital. °[address], [address], [place], [place]  [address] ? [address] [address], [place],    [address] °[phone] ? [phone] ? FAX [phone] °Web site:   [email or web address] General Anesthesia, Adult, Care After  Refer to this sheet  in the next few weeks. These instructions provide you with information on caring for yourself after your procedure. Your health care provider may also give you more specific instructions. Your treatment has been planned according to current medical practices, but problems sometimes occur. Call your health care provider if you have any problems or questions after your procedure.  WHAT TO EXPECT AFTER THE PROCEDURE  After the procedure, it is typical to experience:  Sleepiness.  Nausea and vomiting. HOME CARE INSTRUCTIONS  For the first 24 hours after general anesthesia:  Have a responsible person with you.  Do not drive a car. If you are alone, do not take public transportation.  Do not drink alcohol.  Do not take medicine that has not been prescribed by your health care provider.  Do not sign important papers or make important decisions.  You may resume a normal diet and activities as directed by your health care provider.  Change bandages (dressings) as directed.  If you have questions or problems that seem related to general anesthesia, call the hospital and ask for the anesthetist or anesthesiologist on call. SEEK MEDICAL CARE IF:  You have nausea and vomiting that continue the day after anesthesia.  You develop a rash. SEEK IMMEDIATE MEDICAL CARE IF:  You have difficulty breathing.  You have chest pain.  You have any allergic problems. Document Released: 03/23/2001 Document Revised: 08/17/2013 Document Reviewed: 06/30/2013  [facility] Patient Information 2014 [place], [place].   (see above)

## 2015-01-25 ENCOUNTER — Other Ambulatory Visit (HOSPITAL_COMMUNITY): Payer: Medicaid Other

## 2015-01-25 ENCOUNTER — Encounter (HOSPITAL_COMMUNITY): Payer: Self-pay | Admitting: General Surgery

## 2015-01-25 ENCOUNTER — Other Ambulatory Visit: Payer: Medicaid Other

## 2015-01-28 ENCOUNTER — Encounter (HOSPITAL_COMMUNITY): Payer: Self-pay | Admitting: Emergency Medicine

## 2015-01-28 ENCOUNTER — Emergency Department (HOSPITAL_COMMUNITY): Payer: Medicaid Other

## 2015-01-28 ENCOUNTER — Emergency Department (HOSPITAL_COMMUNITY)
Admission: EM | Admit: 2015-01-28 | Discharge: 2015-01-28 | Disposition: A | Discharge status: Home / Self Care | Payer: Medicaid Other | Attending: Emergency Medicine | Admitting: Emergency Medicine

## 2015-01-28 DIAGNOSIS — F419 Anxiety disorder, unspecified: Secondary | ICD-10-CM | POA: Insufficient documentation

## 2015-01-28 DIAGNOSIS — F329 Major depressive disorder, single episode, unspecified: Secondary | ICD-10-CM | POA: Diagnosis not present

## 2015-01-28 DIAGNOSIS — Z3202 Encounter for pregnancy test, result negative: Secondary | ICD-10-CM | POA: Diagnosis not present

## 2015-01-28 DIAGNOSIS — Z720 Tobacco use: Secondary | ICD-10-CM | POA: Diagnosis not present

## 2015-01-28 DIAGNOSIS — J9811 Atelectasis: Secondary | ICD-10-CM | POA: Diagnosis not present

## 2015-01-28 DIAGNOSIS — Z79899 Other long term (current) drug therapy: Secondary | ICD-10-CM | POA: Diagnosis not present

## 2015-01-28 DIAGNOSIS — I10 Essential (primary) hypertension: Secondary | ICD-10-CM | POA: Diagnosis not present

## 2015-01-28 DIAGNOSIS — M1990 Unspecified osteoarthritis, unspecified site: Secondary | ICD-10-CM | POA: Diagnosis not present

## 2015-01-28 DIAGNOSIS — J45909 Unspecified asthma, uncomplicated: Secondary | ICD-10-CM | POA: Diagnosis not present

## 2015-01-28 DIAGNOSIS — Z8701 Personal history of pneumonia (recurrent): Secondary | ICD-10-CM | POA: Diagnosis not present

## 2015-01-28 DIAGNOSIS — R109 Unspecified abdominal pain: Secondary | ICD-10-CM | POA: Diagnosis present

## 2015-01-28 DIAGNOSIS — K219 Gastro-esophageal reflux disease without esophagitis: Secondary | ICD-10-CM | POA: Diagnosis not present

## 2015-01-28 DIAGNOSIS — R011 Cardiac murmur, unspecified: Secondary | ICD-10-CM | POA: Diagnosis not present

## 2015-01-28 DIAGNOSIS — Z8619 Personal history of other infectious and parasitic diseases: Secondary | ICD-10-CM | POA: Diagnosis not present

## 2015-01-28 LAB — CBC WITH DIFFERENTIAL/PLATELET
Basophils Absolute: 0 10*3/uL (ref 0.0–0.1)
Basophils Relative: 0 % (ref 0–1)
EOS ABS: 0.1 10*3/uL (ref 0.0–0.7)
Eosinophils Relative: 1 % (ref 0–5)
HCT: 43 % (ref 36.0–46.0)
Hemoglobin: 15 g/dL (ref 12.0–15.0)
Lymphocytes Relative: 32 % (ref 12–46)
Lymphs Abs: 3.1 10*3/uL (ref 0.7–4.0)
MCH: 31.8 pg (ref 26.0–34.0)
MCHC: 34.9 g/dL (ref 30.0–36.0)
MCV: 91.3 fL (ref 78.0–100.0)
Monocytes Absolute: 0.5 10*3/uL (ref 0.1–1.0)
Monocytes Relative: 5 % (ref 3–12)
NEUTROS PCT: 62 % (ref 43–77)
Neutro Abs: 6 10*3/uL (ref 1.7–7.7)
PLATELETS: 194 10*3/uL (ref 150–400)
RBC: 4.71 MIL/uL (ref 3.87–5.11)
RDW: 12.6 % (ref 11.5–15.5)
WBC: 9.8 10*3/uL (ref 4.0–10.5)

## 2015-01-28 LAB — COMPREHENSIVE METABOLIC PANEL
ALT: 44 U/L — AB (ref 0–35)
ANION GAP: 7 (ref 5–15)
AST: 32 U/L (ref 0–37)
Albumin: 4.4 g/dL (ref 3.5–5.2)
Alkaline Phosphatase: 66 U/L (ref 39–117)
BILIRUBIN TOTAL: 0.5 mg/dL (ref 0.3–1.2)
BUN: 10 mg/dL (ref 6–23)
CALCIUM: 9.3 mg/dL (ref 8.4–10.5)
CHLORIDE: 106 mmol/L (ref 96–112)
CO2: 25 mmol/L (ref 19–32)
CREATININE: 0.85 mg/dL (ref 0.50–1.10)
GFR calc non Af Amer: 88 mL/min — ABNORMAL LOW (ref >90)
GLUCOSE: 110 mg/dL — AB (ref 70–99)
POTASSIUM: 4.5 mmol/L (ref 3.5–5.1)
Sodium: 138 mmol/L (ref 135–145)
Total Protein: 7.6 g/dL (ref 6.0–8.3)

## 2015-01-28 LAB — URINALYSIS, ROUTINE W REFLEX MICROSCOPIC
BILIRUBIN URINE: NEGATIVE
GLUCOSE, UA: NEGATIVE mg/dL
HGB URINE DIPSTICK: NEGATIVE
Ketones, ur: NEGATIVE mg/dL
NITRITE: NEGATIVE
Protein, ur: NEGATIVE mg/dL
SPECIFIC GRAVITY, URINE: 1.009 (ref 1.005–1.030)
Urobilinogen, UA: 0.2 mg/dL (ref 0.0–1.0)
pH: 7.5 (ref 5.0–8.0)

## 2015-01-28 LAB — POC URINE PREG, ED: Preg Test, Ur: NEGATIVE

## 2015-01-28 LAB — D-DIMER, QUANTITATIVE (NOT AT ARMC): D-Dimer, Quant: 1.58 ug/mL-FEU — ABNORMAL HIGH (ref 0.00–0.48)

## 2015-01-28 LAB — URINE MICROSCOPIC-ADD ON

## 2015-01-28 MED ORDER — HYDROMORPHONE HCL 4 MG PO TABS: ORAL_TABLET | ORAL | Status: DC

## 2015-01-28 MED ORDER — IOHEXOL 350 MG/ML SOLN
100.0000 mL | Freq: Once | INTRAVENOUS | Status: AC | PRN
  Administered 2015-01-28: 100 mL via INTRAVENOUS

## 2015-01-28 MED ORDER — HYDROMORPHONE HCL 1 MG/ML IJ SOLN
1.0000 mg | Freq: Once | INTRAMUSCULAR | Status: AC
  Administered 2015-01-28: 1 mg via INTRAVENOUS
  Filled 2015-01-28: qty 1

## 2015-01-28 MED ORDER — ONDANSETRON HCL 4 MG/2ML IJ SOLN
4.0000 mg | Freq: Once | INTRAMUSCULAR | Status: AC
  Administered 2015-01-28: 4 mg via INTRAVENOUS
  Filled 2015-01-28: qty 2

## 2015-01-28 NOTE — Discharge Instructions (Signed)
Follow up as planned with your md

## 2015-01-28 NOTE — ED Notes (Signed)
Patient transported to CT 

## 2015-01-28 NOTE — ED Provider Notes (Signed)
CSN: [id number]     Arrival date & time 01/28/15  9381 History   First MD Initiated Contact with Patient 01/28/15 0901     Chief Complaint  Patient presents with  . incontinent of urine   . right flank pain      (Consider location/radiation/quality/duration/timing/severity/associated sxs/prior Treatment) Patient is a 34 y.o. female presenting with chest pain. The history is provided by the patient (the pt complains of right chest pain with inspiration.  she had her gall bladder removed tuesday).  Chest Pain Pain location:  R chest Pain quality: aching   Pain quality comment:  Pain with breathinf Pain radiates to:  Does not radiate Pain radiates to the back: no   Pain severity:  Moderate Onset quality:  Gradual Timing:  Constant Progression:  Waxing and waning Chronicity:  Recurrent Context: breathing   Associated symptoms: no abdominal pain, no back pain, no cough, no fatigue and no headache     Past Medical History  Diagnosis Date  . Achalasia     esphog stretched several times  . Abnormal Pap smear   . Depression   . HSV-2 infection   . PVC's (premature ventricular contractions)   . Anxiety   . Blood transfusion 01/2006    [facility]  . GERD (gastroesophageal reflux disease)     no meds - diet controlled  . Asthma     rarely uses inhaler  . Arthritis   . History of abnormal Pap smear   . History of chlamydia 1990's  . Fibromyalgia   . Hiatal hernia   . Cardiomyopathy   . Status post dilation of esophageal narrowing   . HTN (hypertension)   . Heart murmur   . Dysrhythmia   . Shortness of breath dyspnea   . Pneumonia 05/2013  . Constipation due to opioid therapy    Past Surgical History  Procedure Laterality Date  . Tonsillectomy    . Adenoidectomy  12/2002  . Esophagogastroduodenoscopy      with esophageal dilation x 3  . Dilation and curettage of uterus  1999  . Breast surgery  07/2001    tumor removed right breast  . Dilation and curettage of uterus  01/2006   retained products  . Bladder suspension  06/11/2012    Procedure: TRANSVAGINAL TAPE (TVT) PROCEDURE;  Surgeon: Delice Lesch, MD;  Location: [facility];  Service: Gynecology;  Laterality: N/A;  Tension Free Vaginal Tape/cystoscopy  . Laparoscopic tubal ligation  06/11/2012    Procedure: LAPAROSCOPIC TUBAL LIGATION;  Surgeon: Delice Lesch, MD;  Location: [facility];  Service: Gynecology;  Laterality: Bilateral;  Laparoscopic Bilateral Fulgeration  . Cystoscopy  06/11/2012    Procedure: CYSTOSCOPY;  Surgeon: Delice Lesch, MD;  Location: [facility];  Service: Gynecology;  Laterality: N/A;  . Foreign body removal  06/11/2012    Procedure: REMOVAL FOREIGN BODY EXTREMITY;  Surgeon: Delice Lesch, MD;  Location: [facility];  Service: Gynecology;  Laterality: Right;  Removal of Implanon   . Tubal ligation    . Cholecystectomy N/A 01/23/2015    Procedure: LAPAROSCOPIC CHOLECYSTECTOMY ;  Surgeon: Fanny Skates, MD;  Location: [facility];  Service: General;  Laterality: N/A;  case booked with cholangiogram but MD did not complete intraoperative cholangiogram    Family History  Problem Relation Age of Onset  . Heart disease Father   . Hypertension Father   . Stroke Mother   . Hypertension Mother   . Heart attack Mother   . Breast cancer  Mother   . Neuropathy Mother   . Arthritis Mother   . Neuropathy Sister   . Arthritis Sister   . Asthma Mother   . Asthma Sister    History  Substance Use Topics  . Smoking status: Current Some Day Smoker -- 0.40 packs/day for 15 years    Types: Cigarettes  . Smokeless tobacco: Never Used     Comment: HAS CUT BACK  . Alcohol Use: No     Comment: on occasion   OB History    Gravida Para Term Preterm AB TAB SAB Ectopic Multiple Living   4 2 2  0 2  2   2      Review of Systems  Constitutional: Negative for appetite change and fatigue.  HENT: Negative for congestion, ear discharge and sinus pressure.   Eyes: Negative for discharge.  Respiratory: Negative for cough.    Cardiovascular: Positive for chest pain.  Gastrointestinal: Negative for abdominal pain and diarrhea.  Genitourinary: Negative for frequency and hematuria.  Musculoskeletal: Negative for back pain.  Skin: Negative for rash.  Neurological: Negative for seizures and headaches.  Psychiatric/Behavioral: Negative for hallucinations.      Allergies  Dust mite extract; Other; and Pollen extract  Home Medications   Prior to Admission medications   Medication Sig Start Date End Date Taking? Authorizing Provider  albuterol (PROVENTIL HFA;VENTOLIN HFA) 108 (90 BASE) MCG/ACT inhaler Inhale 2 puffs into the lungs every 6 (six) hours as needed for wheezing or shortness of breath. 11/10/14  Yes Janice Norrie, MD  clonazePAM (KLONOPIN) 1 MG tablet Take 1 mg by mouth at bedtime as needed. For anxiety   Yes Historical Provider, MD  EPINEPHrine (EPIPEN) 0.3 mg/0.3 mL SOAJ injection Inject 0.3 mg into the muscle once as needed (for allergies).   Yes Historical Provider, MD  HYDROcodone-acetaminophen (NORCO) 5-325 MG per tablet Take 1-2 tablets by mouth every 6 (six) hours as needed for moderate pain or severe pain. 01/23/15  Yes Fanny Skates, MD  METROGEL VAGINAL 0.75 % vaginal gel Place 5.09 application vaginally at bedtime as needed (for infections).  10/17/14  Yes Historical Provider, MD  omeprazole (PRILOSEC) 40 MG capsule Take 40 mg by mouth 2 (two) times daily.   Yes Historical Provider, MD  oxyCODONE-acetaminophen (PERCOCET/ROXICET) 5-325 MG per tablet Take 1-2 tablets by mouth every 4 (four) hours as needed for moderate pain. 01/24/15  Yes Fanny Skates, MD  HYDROmorphone (DILAUDID) 4 MG tablet Take one every 4-6 hours for pain 01/28/15   Maudry Diego, MD  losartan (COZAAR) 25 MG tablet Take 1 tablet (25 mg total) by mouth daily. Patient not taking: Reported on 01/28/2015 01/11/15   Deboraha Sprang, MD   BP 117/90 mmHg  Pulse 110  Temp(Src) 97.3 F (36.3 C) (Oral)  Resp 18  SpO2 100%  LMP  01/02/2015 Physical Exam  Constitutional: She is oriented to person, place, and time. She appears well-developed.  HENT:  Head: Normocephalic.  Eyes: Conjunctivae and EOM are normal. No scleral icterus.  Neck: Neck supple. No thyromegaly present.  Cardiovascular: Normal rate and regular rhythm.  Exam reveals no gallop and no friction rub.   No murmur heard. Pulmonary/Chest: No stridor. She has no wheezes. She has no rales. She exhibits no tenderness.  Abdominal: She exhibits no distension. There is tenderness. There is no rebound.  Mild soreness to palpation to abd from surgery  Musculoskeletal: Normal range of motion. She exhibits no edema.  Lymphadenopathy:    She has no  cervical adenopathy.  Neurological: She is oriented to person, place, and time. She exhibits normal muscle tone. Coordination normal.  Skin: No rash noted. No erythema.  Psychiatric: She has a normal mood and affect. Her behavior is normal.    ED Course  Procedures (including critical care time) Labs Review Labs Reviewed  URINALYSIS, ROUTINE W REFLEX MICROSCOPIC - Abnormal; Notable for the following:    Leukocytes, UA TRACE (*)    All other components within normal limits  COMPREHENSIVE METABOLIC PANEL - Abnormal; Notable for the following:    Glucose, Bld 110 (*)    ALT 44 (*)    GFR calc non Af Amer 88 (*)    All other components within normal limits  D-DIMER, QUANTITATIVE - Abnormal; Notable for the following:    D-Dimer, Quant 1.58 (*)    All other components within normal limits  CBC WITH DIFFERENTIAL/PLATELET  URINE MICROSCOPIC-ADD ON  POC URINE PREG, ED    Imaging Review Dg Chest 2 View  01/28/2015   CLINICAL DATA:  Acute right-sided chest pain.  EXAM: CHEST  2 VIEW  COMPARISON:  November 10, 2014.  FINDINGS: The heart size and mediastinal contours are within normal limits. Both lungs are clear. No pneumothorax or pleural effusion is noted. The visualized skeletal structures are unremarkable.   IMPRESSION: No acute cardiopulmonary abnormality seen.   Electronically Signed   By: Sabino Dick M.D.   On: 01/28/2015 10:12   Ct Angio Chest Pe W/cm &/or Wo Cm  01/28/2015   CLINICAL DATA:  Right flank pain. Pain with inspiration. Recent cholecystectomy. Symptoms since 01/24/2015.  EXAM: CT ANGIOGRAPHY CHEST WITH CONTRAST  TECHNIQUE: Multidetector CT imaging of the chest was performed using the standard protocol during bolus administration of intravenous contrast. Multiplanar CT image reconstructions and MIPs were obtained to evaluate the vascular anatomy.  CONTRAST:  100 mL OMNIPAQUE IOHEXOL 350 MG/ML SOLN  COMPARISON:  PA and lateral chest 01/28/2015.  FINDINGS: No pulmonary embolus is identified. There is mild cardiomegaly. No pleural or pericardial effusion. No axillary, hilar or mediastinal lymphadenopathy. The lungs demonstrate mild, scattered ground-glass attenuation. Minimal dependent atelectasis is also seen. Imaged upper abdomen is unremarkable. No bony abnormality is seen.  Review of the MIP images confirms the above findings.  IMPRESSION: Negative for pulmonary embolus.  Mild, scattered ground-glass attenuation is likely due to atelectasis.   Electronically Signed   By: Inge Rise M.D.   On: 01/28/2015 11:42     EKG Interpretation None      MDM   Final diagnoses:  Atelectasis    Post op pain with atelectasis.  tx with dilaudid and follow up with md    Maudry Diego, MD 01/28/15 1221

## 2015-01-28 NOTE — ED Notes (Signed)
Patient transported to X-ray 

## 2015-01-28 NOTE — ED Notes (Signed)
Per pt, states she had her gallbladder removed and was d/ced on Wed-since she has been having right flank pain, pain with inspiration, and incontinent of urine

## 2015-01-29 ENCOUNTER — Telehealth: Payer: Self-pay | Admitting: *Deleted

## 2015-01-29 NOTE — Telephone Encounter (Signed)
Faxed cardiac clearance to [facility] for [facility].

## 2015-01-31 ENCOUNTER — Other Ambulatory Visit (INDEPENDENT_AMBULATORY_CARE_PROVIDER_SITE_OTHER): Payer: Medicaid Other | Admitting: *Deleted

## 2015-01-31 ENCOUNTER — Ambulatory Visit (HOSPITAL_COMMUNITY): Payer: Medicaid Other | Attending: Internal Medicine

## 2015-01-31 DIAGNOSIS — Z79899 Other long term (current) drug therapy: Secondary | ICD-10-CM

## 2015-01-31 DIAGNOSIS — I428 Other cardiomyopathies: Secondary | ICD-10-CM

## 2015-01-31 DIAGNOSIS — I493 Ventricular premature depolarization: Secondary | ICD-10-CM | POA: Diagnosis not present

## 2015-01-31 DIAGNOSIS — I429 Cardiomyopathy, unspecified: Secondary | ICD-10-CM | POA: Insufficient documentation

## 2015-01-31 LAB — BASIC METABOLIC PANEL
BUN: 14 mg/dL (ref 6–23)
CALCIUM: 9.4 mg/dL (ref 8.4–10.5)
CO2: 28 meq/L (ref 19–32)
Chloride: 107 mEq/L (ref 96–112)
Creatinine, Ser: 0.87 mg/dL (ref 0.40–1.20)
GFR: 95.01 mL/min (ref >60.00)
GLUCOSE: 103 mg/dL — AB (ref 70–99)
Potassium: 4.1 mEq/L (ref 3.5–5.1)
SODIUM: 137 meq/L (ref 135–145)

## 2015-01-31 NOTE — Addendum Note (Signed)
Addended by: Eulis Foster on: 01/31/2015 09:57 AM   Modules accepted: Orders

## 2015-01-31 NOTE — Progress Notes (Signed)
2D Echo completed. 01/31/2015

## 2015-02-05 ENCOUNTER — Encounter: Payer: Self-pay | Admitting: Internal Medicine

## 2015-02-05 NOTE — Telephone Encounter (Signed)
New Msg       Pt calling to get results of Echo, please return call.

## 2015-02-07 NOTE — Telephone Encounter (Signed)
This encounter was created in error - please disregard.

## 2015-02-22 ENCOUNTER — Ambulatory Visit: Payer: Medicaid Other | Admitting: Physician Assistant

## 2015-03-30 ENCOUNTER — Encounter (HOSPITAL_COMMUNITY): Payer: Self-pay | Admitting: Emergency Medicine

## 2015-03-30 ENCOUNTER — Emergency Department (HOSPITAL_COMMUNITY)
Admission: EM | Admit: 2015-03-30 | Discharge: 2015-03-30 | Disposition: A | Discharge status: Home / Self Care | Payer: Medicaid Other | Attending: Emergency Medicine | Admitting: Emergency Medicine

## 2015-03-30 ENCOUNTER — Emergency Department (HOSPITAL_COMMUNITY): Payer: Medicaid Other

## 2015-03-30 DIAGNOSIS — Z8619 Personal history of other infectious and parasitic diseases: Secondary | ICD-10-CM | POA: Insufficient documentation

## 2015-03-30 DIAGNOSIS — M25571 Pain in right ankle and joints of right foot: Secondary | ICD-10-CM | POA: Diagnosis not present

## 2015-03-30 DIAGNOSIS — F419 Anxiety disorder, unspecified: Secondary | ICD-10-CM | POA: Diagnosis not present

## 2015-03-30 DIAGNOSIS — J45901 Unspecified asthma with (acute) exacerbation: Secondary | ICD-10-CM | POA: Insufficient documentation

## 2015-03-30 DIAGNOSIS — K219 Gastro-esophageal reflux disease without esophagitis: Secondary | ICD-10-CM | POA: Diagnosis not present

## 2015-03-30 DIAGNOSIS — Z8701 Personal history of pneumonia (recurrent): Secondary | ICD-10-CM | POA: Insufficient documentation

## 2015-03-30 DIAGNOSIS — R079 Chest pain, unspecified: Secondary | ICD-10-CM | POA: Diagnosis present

## 2015-03-30 DIAGNOSIS — R0789 Other chest pain: Secondary | ICD-10-CM | POA: Diagnosis not present

## 2015-03-30 DIAGNOSIS — R0602 Shortness of breath: Secondary | ICD-10-CM | POA: Diagnosis not present

## 2015-03-30 DIAGNOSIS — I10 Essential (primary) hypertension: Secondary | ICD-10-CM | POA: Insufficient documentation

## 2015-03-30 DIAGNOSIS — M2550 Pain in unspecified joint: Secondary | ICD-10-CM

## 2015-03-30 DIAGNOSIS — M79621 Pain in right upper arm: Secondary | ICD-10-CM | POA: Insufficient documentation

## 2015-03-30 DIAGNOSIS — Z87828 Personal history of other (healed) physical injury and trauma: Secondary | ICD-10-CM | POA: Diagnosis not present

## 2015-03-30 DIAGNOSIS — M25561 Pain in right knee: Secondary | ICD-10-CM | POA: Diagnosis not present

## 2015-03-30 DIAGNOSIS — M791 Myalgia, unspecified site: Secondary | ICD-10-CM

## 2015-03-30 DIAGNOSIS — Z720 Tobacco use: Secondary | ICD-10-CM | POA: Diagnosis not present

## 2015-03-30 DIAGNOSIS — M1990 Unspecified osteoarthritis, unspecified site: Secondary | ICD-10-CM | POA: Insufficient documentation

## 2015-03-30 DIAGNOSIS — R011 Cardiac murmur, unspecified: Secondary | ICD-10-CM | POA: Diagnosis not present

## 2015-03-30 DIAGNOSIS — Z79899 Other long term (current) drug therapy: Secondary | ICD-10-CM | POA: Diagnosis not present

## 2015-03-30 LAB — CBC
HEMATOCRIT: 35.3 % — AB (ref 36.0–46.0)
Hemoglobin: 12 g/dL (ref 12.0–15.0)
MCH: 30.7 pg (ref 26.0–34.0)
MCHC: 34 g/dL (ref 30.0–36.0)
MCV: 90.3 fL (ref 78.0–100.0)
PLATELETS: 184 10*3/uL (ref 150–400)
RBC: 3.91 MIL/uL (ref 3.87–5.11)
RDW: 12.9 % (ref 11.5–15.5)
WBC: 8.3 10*3/uL (ref 4.0–10.5)

## 2015-03-30 LAB — BASIC METABOLIC PANEL
ANION GAP: 10 (ref 5–15)
BUN: 11 mg/dL (ref 6–23)
CO2: 24 mmol/L (ref 19–32)
Calcium: 9.2 mg/dL (ref 8.4–10.5)
Chloride: 108 mmol/L (ref 96–112)
Creatinine, Ser: 0.86 mg/dL (ref 0.50–1.10)
GFR calc Af Amer: >90 mL/min (ref >90)
GFR calc non Af Amer: 87 mL/min — ABNORMAL LOW (ref >90)
GLUCOSE: 93 mg/dL (ref 70–99)
POTASSIUM: 3.8 mmol/L (ref 3.5–5.1)
SODIUM: 142 mmol/L (ref 135–145)

## 2015-03-30 LAB — I-STAT TROPONIN, ED: Troponin i, poc: 0 ng/mL (ref 0.00–0.08)

## 2015-03-30 MED ORDER — GI COCKTAIL ~~LOC~~
30.0000 mL | Freq: Once | ORAL | Status: AC
  Administered 2015-03-30: 30 mL via ORAL
  Filled 2015-03-30: qty 30

## 2015-03-30 MED ORDER — KETOROLAC TROMETHAMINE 60 MG/2ML IM SOLN
60.0000 mg | Freq: Once | INTRAMUSCULAR | Status: AC
  Administered 2015-03-30: 60 mg via INTRAMUSCULAR
  Filled 2015-03-30: qty 2

## 2015-03-30 NOTE — Discharge Instructions (Signed)
1. Medications: take Lyrica and omeprazole as directed along with your usual home medications 2. Treatment: rest, drink plenty of fluids, gentle stretching, alternate ice and heat 3. Follow Up: Please followup with your primary doctor in 1 day for discussion of your diagnoses and further evaluation after today's visit; if you do not have a primary care doctor use the resource guide provided to find one; Please return to the ER for worsening chest pain, pain that radiates to your jaw or arm or is associated with nausea and vomiting.  Please also follow-up with Dr Caryl Comes as needed and use the referrals give to see neurology and the pain management clinic.    Arthralgia Arthralgia is joint pain. A joint is a place where two bones meet. Joint pain can happen for many reasons. The joint can be bruised, stiff, infected, or weak from aging. Pain usually goes away after resting and taking medicine for soreness.  HOME CARE  Rest the joint as told by your doctor.  Keep the sore joint raised (elevated) for the first 24 hours.  Put ice on the joint area.  Put ice in a plastic bag.  Place a towel between your skin and the bag.  Leave the ice on for 15-20 minutes, 03-04 times a day.  Wear your splint, casting, elastic bandage, or sling as told by your doctor.  Only take medicine as told by your doctor. Do not take aspirin.  Use crutches as told by your doctor. Do not put weight on the joint until told to by your doctor. GET HELP RIGHT AWAY IF:   You have bruising, puffiness (swelling), or more pain.  Your fingers or toes turn blue or start to lose feeling (numb).  Your medicine does not lessen the pain.  Your pain becomes severe.  You have a temperature by mouth above 102 F (38.9 C), not controlled by medicine.  You cannot move or use the joint. MAKE SURE YOU:   Understand these instructions.  Will watch your condition.  Will get help right away if you are not doing well or get  worse. Document Released: 12/03/2009 Document Revised: 03/08/2012 Document Reviewed: 12/03/2009 [facility] Patient Information 2015 [place], [place]. This information is not intended to replace advice given to you by your health care provider. Make sure you discuss any questions you have with your health care provider.

## 2015-03-30 NOTE — ED Provider Notes (Signed)
CSN: [id number]     Arrival date & time 03/30/15  1631 History   First MD Initiated Contact with Patient 03/30/15 2021     Chief Complaint  Patient presents with  . body pain   . Chest Pain  . Shortness of Breath     (Consider location/radiation/quality/duration/timing/severity/associated sxs/prior Treatment) Patient is a 34 y.o. female presenting with chest pain and shortness of breath. The history is provided by the patient and medical records. No language interpreter was used.  Chest Pain Associated symptoms: shortness of breath   Associated symptoms: no abdominal pain, no back pain, no cough, no diaphoresis, no fatigue, no fever, no headache, no nausea and not vomiting   Shortness of Breath Associated symptoms: chest pain   Associated symptoms: no abdominal pain, no cough, no diaphoresis, no fever, no headaches, no rash, no vomiting and no wheezing      Raven [name] is a 34 y.o. female  with a hx of fibromyalgia, depression, anxiety, chronic pain, cardiomyopathy, HTN, opioid induced constipation presents to the Emergency Department complaining of gradual, persistent, progressively worsening pain in the right side of her body beginning in her toes and radiating throughout her whole side and into her right arm onset this morning around 5am.  She reports the pain is under her skin and her skin hurts to touch,  Pt reports these pains have been intermittent since 2009, but today's pain was worse than usual. She reports the side of her body varies.  She reports she took a Goody powder today without relief.  Pt reports she was just prescribed Lyrica by her doctor, but she has only had one dose.  She reports she has been taking Vicoprofen, but is out.  She reports she asked for an increased dose but was given the Lyrica instead.  She reports taking it for the first time 3 days ago.  She has not taken it as prescribed because it did not make her feel better.  She was also to be referred to a pain  clinic, but this had not happened yet.    Pt reports she also had chest pain with associated SOB similar to the last time she had an upper respiratory infection.  Pt reports the pain began around 10pm last night and has improved, but not resolved.  She reports the pain was initially a cramping pain, but now just feels likes tightness. When questioned about SOB she reports that it is just the tightness in her chest. She denies rhinorrhea, ST, otalgia, cough, fever, abd pain, N/V/D, weakness.  She is worried that she had pneumonia.  She denies sick contacts.  She has a hx of cardiomyopathy monitored by Dr. Caryl Comes.  She denies pain or SOB on exertion.  Pt reports compliance with her cardiac medications.  She reports her chest pain is always worse after eating, but does did not take any reflux medications.  She reports she is supposed to be taking omeprazole every day, but has not taken it in > 1 week.  She also reports the pain began after eating a large meal at a "buffet place."    Pt also reports she started her menstrual cycle today which made all her other symptoms worse.  Pt reports she often had panic attacks when she has pain.  Pt reports she drove here tonight.  Nothing makes her symptoms better or worse.    Past Medical History  Diagnosis Date  . Achalasia     esphog stretched several  times  . Abnormal Pap smear   . Depression   . HSV-2 infection   . PVC's (premature ventricular contractions)   . Anxiety   . Blood transfusion 01/2006    [place]  . GERD (gastroesophageal reflux disease)     no meds - diet controlled  . Asthma     rarely uses inhaler  . Arthritis   . History of abnormal Pap smear   . History of chlamydia 1990's  . Fibromyalgia   . Hiatal hernia   . Cardiomyopathy   . Status post dilation of esophageal narrowing   . HTN (hypertension)   . Heart murmur   . Dysrhythmia   . Shortness of breath dyspnea   . Pneumonia 05/2013  . Constipation due to opioid therapy    Past  Surgical History  Procedure Laterality Date  . Tonsillectomy    . Adenoidectomy  12/2002  . Esophagogastroduodenoscopy      with esophageal dilation x 3  . Dilation and curettage of uterus  1999  . Breast surgery  07/2001    tumor removed right breast  . Dilation and curettage of uterus  01/2006    retained products  . Bladder suspension  06/11/2012    Procedure: TRANSVAGINAL TAPE (TVT) PROCEDURE;  Surgeon: Delice Lesch, MD;  Location: [facility];  Service: Gynecology;  Laterality: N/A;  Tension Free Vaginal Tape/cystoscopy  . Laparoscopic tubal ligation  06/11/2012    Procedure: LAPAROSCOPIC TUBAL LIGATION;  Surgeon: Delice Lesch, MD;  Location: [facility];  Service: Gynecology;  Laterality: Bilateral;  Laparoscopic Bilateral Fulgeration  . Cystoscopy  06/11/2012    Procedure: CYSTOSCOPY;  Surgeon: Delice Lesch, MD;  Location: [facility];  Service: Gynecology;  Laterality: N/A;  . Foreign body removal  06/11/2012    Procedure: REMOVAL FOREIGN BODY EXTREMITY;  Surgeon: Delice Lesch, MD;  Location: [facility];  Service: Gynecology;  Laterality: Right;  Removal of Implanon   . Tubal ligation    . Cholecystectomy N/A 01/23/2015    Procedure: LAPAROSCOPIC CHOLECYSTECTOMY ;  Surgeon: Fanny Skates, MD;  Location: [facility];  Service: General;  Laterality: N/A;  case booked with cholangiogram but MD did not complete intraoperative cholangiogram    Family History  Problem Relation Age of Onset  . Heart disease Father   . Hypertension Father   . Stroke Mother   . Hypertension Mother   . Heart attack Mother   . Breast cancer Mother   . Neuropathy Mother   . Arthritis Mother   . Neuropathy Sister   . Arthritis Sister   . Asthma Mother   . Asthma Sister    History  Substance Use Topics  . Smoking status: Current Some Day Smoker -- 0.40 packs/day for 15 years    Types: Cigarettes  . Smokeless tobacco: Never Used     Comment: HAS CUT BACK  . Alcohol Use: No     Comment: on occasion   OB  History    Gravida Para Term Preterm AB TAB SAB Ectopic Multiple Living   4 2 2  0 2  2   2      Review of Systems  Constitutional: Negative for fever, diaphoresis, appetite change, fatigue and unexpected weight change.  HENT: Negative for mouth sores.   Eyes: Negative for visual disturbance.  Respiratory: Positive for chest tightness and shortness of breath. Negative for cough and wheezing.   Cardiovascular: Positive for chest pain.  Gastrointestinal: Negative for nausea, vomiting, abdominal pain, diarrhea and  constipation.  Endocrine: Negative for polydipsia, polyphagia and polyuria.  Genitourinary: Negative for dysuria, urgency, frequency and hematuria.  Musculoskeletal: Positive for myalgias and arthralgias. Negative for back pain and neck stiffness.  Skin: Negative for rash.  Allergic/Immunologic: Negative for immunocompromised state.  Neurological: Negative for syncope, light-headedness and headaches.  Hematological: Does not bruise/bleed easily.  Psychiatric/Behavioral: Negative for sleep disturbance. The patient is not nervous/anxious.       Allergies  Dust mite extract; Other; and Pollen extract  Home Medications   Prior to Admission medications   Medication Sig Start Date End Date Taking? Authorizing Provider  albuterol (PROVENTIL HFA;VENTOLIN HFA) 108 (90 BASE) MCG/ACT inhaler Inhale 2 puffs into the lungs every 6 (six) hours as needed for wheezing or shortness of breath. 11/10/14  Yes Rolland Porter, MD  clonazePAM (KLONOPIN) 1 MG tablet Take 1 mg by mouth at bedtime as needed. For anxiety   Yes Historical Provider, MD  EPINEPHrine (EPIPEN) 0.3 mg/0.3 mL SOAJ injection Inject 0.3 mg into the muscle once as needed (for allergies).   Yes Historical Provider, MD  HYDROcodone-acetaminophen (NORCO) 5-325 MG per tablet Take 1-2 tablets by mouth every 6 (six) hours as needed for moderate pain or severe pain. 01/23/15  Yes Fanny Skates, MD  losartan (COZAAR) 25 MG tablet Take 1  tablet (25 mg total) by mouth daily. 01/11/15  Yes Deboraha Sprang, MD  METROGEL VAGINAL 0.75 % vaginal gel Place 4.33 application vaginally at bedtime as needed (for infections).  10/17/14  Yes Historical Provider, MD  omeprazole (PRILOSEC) 40 MG capsule Take 40 mg by mouth 2 (two) times daily.   Yes Historical Provider, MD  HYDROmorphone (DILAUDID) 4 MG tablet Take one every 4-6 hours for pain 01/28/15   Milton Ferguson, MD  oxyCODONE-acetaminophen (PERCOCET/ROXICET) 5-325 MG per tablet Take 1-2 tablets by mouth every 4 (four) hours as needed for moderate pain. 01/24/15   Fanny Skates, MD   BP 147/76 mmHg  Pulse 67  Temp(Src) 98.1 F (36.7 C) (Oral)  Resp 17  SpO2 97%  LMP 03/30/2015 Physical Exam  Constitutional: She appears well-developed and well-nourished. No distress.  Awake, alert, nontoxic appearance  HENT:  Head: Normocephalic and atraumatic.  Mouth/Throat: Oropharynx is clear and moist. No oropharyngeal exudate.  Eyes: Conjunctivae are normal. No scleral icterus.  Neck: Normal range of motion. Neck supple.  Cardiovascular: Normal rate, regular rhythm, normal heart sounds and intact distal pulses.   No murmur heard. Pulmonary/Chest: Effort normal and breath sounds normal. No respiratory distress. She has no wheezes.  Equal chest expansion  Abdominal: Soft. Bowel sounds are normal. She exhibits no distension and no mass. There is no tenderness. There is no rebound and no guarding.  Musculoskeletal: Normal range of motion. She exhibits no edema.  TTP of the entire right side of her body; most specifically her right ankle, knee and the back of her right upper arm No edema of any extremity  Lymphadenopathy:    She has no cervical adenopathy.  Neurological: She is alert. She exhibits normal muscle tone. Coordination normal.  Speech is clear and goal oriented Moves extremities without ataxia  Skin: Skin is warm and dry. No rash noted. She is not diaphoretic. No erythema.   Psychiatric: She has a normal mood and affect.  Nursing note and vitals reviewed.   ED Course  Procedures (including critical care time) Labs Review Labs Reviewed  CBC - Abnormal; Notable for the following:    HCT 35.3 (*)    All other  components within normal limits  BASIC METABOLIC PANEL - Abnormal; Notable for the following:    GFR calc non Af Amer 87 (*)    All other components within normal limits  PREGNANCY, URINE  I-STAT TROPOININ, ED    Imaging Review Dg Chest 2 View  03/30/2015   CLINICAL DATA:  Initial evaluation for chest pain syncope shortness of breath for 4 days history of asthma and cardiomyopathy  EXAM: CHEST  2 VIEW  COMPARISON:  01/28/2015  FINDINGS: Mild cardiac enlargement similar to prior study. Vascular pattern normal. No infiltrate or effusion.  IMPRESSION: No acute findings   Electronically Signed   By: Skipper Cliche M.D.   On: 03/30/2015 17:09     EKG Interpretation   Date/Time:  Friday March 30 2015 16:44:18 EDT Ventricular Rate:  53 PR Interval:  155 QRS Duration: 100 QT Interval:  442 QTC Calculation: 415 R Axis:   23 Text Interpretation:  Sinus rhythm Low voltage, extremity and precordial  leads Baseline wander in lead(s) II III aVF V2 V3 V4 V6 Since last tracing  rate slower and voltage deceased Confirmed by Eulis Foster  MD, ELLIOTT ([id number])  on 03/30/2015 4:51:22 PM      MDM   Final diagnoses:  Gastroesophageal reflux disease, esophagitis presence not specified  Myalgia  Arthralgia  Atypical chest pain    Raven [name] presents with numerous complaints today.  He primary reason for presenting to the ED is a painful flare of her fibromyalgia on the right side of her body.  She requests answers to the reason for her pain.  Pain is atraumatic.  No infection  Labs reassuring and ECG without ischemic changes. CP for > 6 hours without elevation in his troponin.  Pt denies recent travel, swelling of either leg, estrogen usage, recent surgery or  fractures.  She reports she stopped smoking "cold [place]" on 03/14/15.   Record review shows that pt was last evaluated by Dr. Caryl Comes in Jan 2016 with nonischemic cardiomyopathy and no exercise intolerance at that time. She had an EF 45% by echo in 12/14.   10:00 PM Chest pain is not likely of cardiac or pulmonary etiology d/t presentation, PERC negative, VSS, no tracheal deviation, no JVD or new murmur, RRR, breath sounds equal bilaterally, EKG without acute abnormalities, negative troponin, and negative CXR. Pt has been advised to return to the ED if CP becomes exertional, associated with diaphoresis or nausea, radiates to left jaw/arm, worsens or becomes concerning in any way. Pt appears reliable for follow up and is agreeable to discharge. She will be given the requested referral to neurology and pain management. Discussed at length that the ED will be unable to treat her pain with narcotics.  She is in agreement with this plan.    Case has been discussed with Dr. Ralene Bathe who agrees with the above plan to discharge.   BP 147/76 mmHg  Pulse 67  Temp(Src) 98.1 F (36.7 C) (Oral)  Resp 17  SpO2 97%  LMP 03/30/2015    Raven Soho Zanasia Hickson, PA-C 03/30/15 [id number]  Quintella Reichert, MD 03/30/15 [phone]

## 2015-03-30 NOTE — ED Notes (Addendum)
Pt from wokr c/o pain from her toes to head, she also c/o chest pain and shortness of breath since last night. Pt reports being treated for fibromyalgia and reports she has only taken one dose of lyrica. She reports "the veins were popping out of my feet because it hurt so bad". She reports seeing her PCP recently and asking to increase her pain medication however, her PCP told her to take the lyrica prescribed.

## 2015-04-27 ENCOUNTER — Emergency Department (HOSPITAL_COMMUNITY): Payer: Medicaid Other

## 2015-04-27 ENCOUNTER — Emergency Department (HOSPITAL_COMMUNITY)
Admission: EM | Admit: 2015-04-27 | Discharge: 2015-04-27 | Disposition: A | Discharge status: Home / Self Care | Payer: Medicaid Other | Attending: Emergency Medicine | Admitting: Emergency Medicine

## 2015-04-27 ENCOUNTER — Encounter (HOSPITAL_COMMUNITY): Payer: Self-pay | Admitting: *Deleted

## 2015-04-27 DIAGNOSIS — K219 Gastro-esophageal reflux disease without esophagitis: Secondary | ICD-10-CM | POA: Diagnosis not present

## 2015-04-27 DIAGNOSIS — I10 Essential (primary) hypertension: Secondary | ICD-10-CM | POA: Diagnosis not present

## 2015-04-27 DIAGNOSIS — F419 Anxiety disorder, unspecified: Secondary | ICD-10-CM | POA: Diagnosis not present

## 2015-04-27 DIAGNOSIS — R011 Cardiac murmur, unspecified: Secondary | ICD-10-CM | POA: Insufficient documentation

## 2015-04-27 DIAGNOSIS — Z720 Tobacco use: Secondary | ICD-10-CM | POA: Insufficient documentation

## 2015-04-27 DIAGNOSIS — F329 Major depressive disorder, single episode, unspecified: Secondary | ICD-10-CM | POA: Insufficient documentation

## 2015-04-27 DIAGNOSIS — M79602 Pain in left arm: Secondary | ICD-10-CM | POA: Diagnosis present

## 2015-04-27 DIAGNOSIS — M1990 Unspecified osteoarthritis, unspecified site: Secondary | ICD-10-CM | POA: Insufficient documentation

## 2015-04-27 DIAGNOSIS — R531 Weakness: Secondary | ICD-10-CM | POA: Insufficient documentation

## 2015-04-27 DIAGNOSIS — R29898 Other symptoms and signs involving the musculoskeletal system: Secondary | ICD-10-CM

## 2015-04-27 DIAGNOSIS — Z8701 Personal history of pneumonia (recurrent): Secondary | ICD-10-CM | POA: Insufficient documentation

## 2015-04-27 DIAGNOSIS — Z8619 Personal history of other infectious and parasitic diseases: Secondary | ICD-10-CM | POA: Insufficient documentation

## 2015-04-27 DIAGNOSIS — R200 Anesthesia of skin: Secondary | ICD-10-CM | POA: Diagnosis not present

## 2015-04-27 DIAGNOSIS — J45909 Unspecified asthma, uncomplicated: Secondary | ICD-10-CM | POA: Diagnosis not present

## 2015-04-27 DIAGNOSIS — Z79899 Other long term (current) drug therapy: Secondary | ICD-10-CM | POA: Diagnosis not present

## 2015-04-27 LAB — CBC
HEMATOCRIT: 35.9 % — AB (ref 36.0–46.0)
Hemoglobin: 12.3 g/dL (ref 12.0–15.0)
MCH: 30.1 pg (ref 26.0–34.0)
MCHC: 34.3 g/dL (ref 30.0–36.0)
MCV: 87.8 fL (ref 78.0–100.0)
PLATELETS: 198 10*3/uL (ref 150–400)
RBC: 4.09 MIL/uL (ref 3.87–5.11)
RDW: 12.9 % (ref 11.5–15.5)
WBC: 6.9 10*3/uL (ref 4.0–10.5)

## 2015-04-27 LAB — BASIC METABOLIC PANEL
Anion gap: 7 (ref 5–15)
BUN: 14 mg/dL (ref 6–23)
CHLORIDE: 105 mmol/L (ref 96–112)
CO2: 26 mmol/L (ref 19–32)
Calcium: 9.3 mg/dL (ref 8.4–10.5)
Creatinine, Ser: 0.97 mg/dL (ref 0.50–1.10)
GFR calc non Af Amer: 75 mL/min — ABNORMAL LOW (ref >90)
GFR, EST AFRICAN AMERICAN: 87 mL/min — AB (ref >90)
GLUCOSE: 105 mg/dL — AB (ref 70–99)
POTASSIUM: 4.4 mmol/L (ref 3.5–5.1)
Sodium: 138 mmol/L (ref 135–145)

## 2015-04-27 LAB — TROPONIN I: Troponin I: <0.03 ng/mL (ref <0.031)

## 2015-04-27 NOTE — ED Notes (Signed)
Pt states L arm pain, numbness and cold sensations to L arm pain since Wed.  Pt also states diaphoresis, but has hx of anxiety.  Grips equal, even though pt feels weak.  Denies headache.  Neuro intact.

## 2015-04-27 NOTE — ED Notes (Signed)
Dr. Rancour at bedside. 

## 2015-04-27 NOTE — Discharge Instructions (Signed)
Paresthesia There is no evidence of heart attack or stroke. Followup with your doctor and the neurologist. Return to the ED if you develop new or worsening symptoms. Paresthesia is an abnormal burning or prickling sensation. This sensation is generally felt in the hands, arms, legs, or feet. However, it may occur in any part of the body. It is usually not painful. The feeling may be described as:  Tingling or numbness.  "Pins and needles."  Skin crawling.  Buzzing.  Limbs "falling asleep."  Itching. Most people experience temporary (transient) paresthesia at some time in their lives. CAUSES  Paresthesia may occur when you breathe too quickly (hyperventilation). It can also occur without any apparent cause. Commonly, paresthesia occurs when pressure is placed on a nerve. The feeling quickly goes away once the pressure is removed. For some people, however, paresthesia is a long-lasting (chronic) condition caused by an underlying disorder. The underlying disorder may be:  A traumatic, direct injury to nerves. Examples include a:  Broken (fractured) neck.  Fractured skull.  A disorder affecting the brain and spinal cord (central nervous system). Examples include:  Transverse myelitis.  Encephalitis.  Transient ischemic attack.  Multiple sclerosis.  Stroke.  Tumor or blood vessel problems, such as an arteriovenous malformation pressing against the brain or spinal cord.  A condition that damages the peripheral nerves (peripheral neuropathy). Peripheral nerves are not part of the brain and spinal cord. These conditions include:  Diabetes.  Peripheral vascular disease.  Nerve entrapment syndromes, such as carpal tunnel syndrome.  Shingles.  Hypothyroidism.  Vitamin B12 deficiencies.  Alcoholism.  Heavy metal poisoning (lead, arsenic).  Rheumatoid arthritis.  Systemic lupus erythematosus. DIAGNOSIS  Your caregiver will attempt to find the underlying cause of your  paresthesia. Your caregiver may:  Take your medical history.  Perform a physical exam.  Order various lab tests.  Order imaging tests. TREATMENT  Treatment for paresthesia depends on the underlying cause. HOME CARE INSTRUCTIONS  Avoid drinking alcohol.  You may consider massage or acupuncture to help relieve your symptoms.  Keep all follow-up appointments as directed by your caregiver. SEEK IMMEDIATE MEDICAL CARE IF:   You feel weak.  You have trouble walking or moving.  You have problems with speech or vision.  You feel confused.  You cannot control your bladder or bowel movements.  You feel numbness after an injury.  You faint.  Your burning or prickling feeling gets worse when walking.  You have pain, cramps, or dizziness.  You develop a rash. MAKE SURE YOU:  Understand these instructions.  Will watch your condition.  Will get help right away if you are not doing well or get worse. Document Released: 12/05/2002 Document Revised: 03/08/2012 Document Reviewed: 09/05/2011 [facility] Patient Information 2015 [place], [place]. This information is not intended to replace advice given to you by your health care provider. Make sure you discuss any questions you have with your health care provider.

## 2015-04-27 NOTE — ED Notes (Signed)
Patient transported to CT 

## 2015-04-27 NOTE — ED Notes (Signed)
Reported to dr. Wyvonnia Dusky that patient requests pain medication and anxiety medication.

## 2015-04-27 NOTE — ED Provider Notes (Signed)
CSN: [id number]     Arrival date & time 04/27/15  1331 History   First MD Initiated Contact with Patient 04/27/15 1655     Chief Complaint  Patient presents with  . Arm Pain  . Numbness     (Consider location/radiation/quality/duration/timing/severity/associated sxs/prior Treatment) HPI Comments: Patient presents with episode of left arm pain, numbness, weakness intermittently for the past 3 days. She reports on Wednesday she had several hours of left arm feeling heavy and dead" that lasted several hours. This is associated with a cool sensation and numbness in her fingers. She had some diaphoresis but thinks it was over anxiety. No chest pain or back pain. This morning she said numbness and weakness in her left arm and 10 AM his been constant. She endorses a cool sensation and tingling and numbness as well. She denies any headache, neck pain, chest pain, shortness of breath or fever. Patient with similar visit 1 month ago with pain on the right side of her body that was attributed to her front myalgia. She states this feels different and she normally does not have the tingling and numbness.  Patient is a 34 y.o. female presenting with arm pain. The history is provided by the patient.  Arm Pain Pertinent negatives include no chest pain, no abdominal pain and no shortness of breath.    Past Medical History  Diagnosis Date  . Achalasia     esphog stretched several times  . Abnormal Pap smear   . Depression   . HSV-2 infection   . PVC's (premature ventricular contractions)   . Anxiety   . Blood transfusion 01/2006    [place]  . GERD (gastroesophageal reflux disease)     no meds - diet controlled  . Asthma     rarely uses inhaler  . Arthritis   . History of abnormal Pap smear   . History of chlamydia 1990's  . Fibromyalgia   . Hiatal hernia   . Cardiomyopathy   . Status post dilation of esophageal narrowing   . HTN (hypertension)   . Heart murmur   . Dysrhythmia   . Shortness of  breath dyspnea   . Pneumonia 05/2013  . Constipation due to opioid therapy    Past Surgical History  Procedure Laterality Date  . Tonsillectomy    . Adenoidectomy  12/2002  . Esophagogastroduodenoscopy      with esophageal dilation x 3  . Dilation and curettage of uterus  1999  . Breast surgery  07/2001    tumor removed right breast  . Dilation and curettage of uterus  01/2006    retained products  . Bladder suspension  06/11/2012    Procedure: TRANSVAGINAL TAPE (TVT) PROCEDURE;  Surgeon: Delice Lesch, MD;  Location: [facility];  Service: Gynecology;  Laterality: N/A;  Tension Free Vaginal Tape/cystoscopy  . Laparoscopic tubal ligation  06/11/2012    Procedure: LAPAROSCOPIC TUBAL LIGATION;  Surgeon: Delice Lesch, MD;  Location: [facility];  Service: Gynecology;  Laterality: Bilateral;  Laparoscopic Bilateral Fulgeration  . Cystoscopy  06/11/2012    Procedure: CYSTOSCOPY;  Surgeon: Delice Lesch, MD;  Location: [facility];  Service: Gynecology;  Laterality: N/A;  . Foreign body removal  06/11/2012    Procedure: REMOVAL FOREIGN BODY EXTREMITY;  Surgeon: Delice Lesch, MD;  Location: [facility];  Service: Gynecology;  Laterality: Right;  Removal of Implanon   . Tubal ligation    . Cholecystectomy N/A 01/23/2015    Procedure: LAPAROSCOPIC CHOLECYSTECTOMY ;  Surgeon: Fanny Skates, MD;  Location: [facility];  Service: General;  Laterality: N/A;  case booked with cholangiogram but MD did not complete intraoperative cholangiogram    Family History  Problem Relation Age of Onset  . Heart disease Father   . Hypertension Father   . Stroke Mother   . Hypertension Mother   . Heart attack Mother   . Breast cancer Mother   . Neuropathy Mother   . Arthritis Mother   . Neuropathy Sister   . Arthritis Sister   . Asthma Mother   . Asthma Sister    History  Substance Use Topics  . Smoking status: Current Some Day Smoker -- 0.40 packs/day for 15 years    Types: Cigarettes  . Smokeless tobacco: Never Used      Comment: HAS CUT BACK  . Alcohol Use: No     Comment: on occasion   OB History    Gravida Para Term Preterm AB TAB SAB Ectopic Multiple Living   4 2 2  0 2  2   2      Review of Systems  Constitutional: Negative for fever, activity change and appetite change.  HENT: Negative for congestion and rhinorrhea.   Respiratory: Negative for cough, chest tightness and shortness of breath.   Cardiovascular: Negative for chest pain.  Gastrointestinal: Negative for nausea, vomiting and abdominal pain.  Genitourinary: Negative for dysuria, vaginal bleeding and vaginal discharge.  Musculoskeletal: Negative for myalgias and arthralgias.  Skin: Negative for rash.  Neurological: Positive for weakness and numbness.    A complete 10 system review of systems was obtained and all systems are negative except as noted in the HPI and PMH.    Allergies  Dust mite extract; Other; and Pollen extract  Home Medications   Prior to Admission medications   Medication Sig Start Date End Date Taking? Authorizing Provider  albuterol (PROVENTIL HFA;VENTOLIN HFA) 108 (90 BASE) MCG/ACT inhaler Inhale 2 puffs into the lungs every 6 (six) hours as needed for wheezing or shortness of breath. 11/10/14  Yes Rolland Porter, MD  clonazePAM (KLONOPIN) 1 MG tablet Take 1 mg by mouth at bedtime as needed. For anxiety   Yes Historical Provider, MD  HYDROcodone-acetaminophen (NORCO) 5-325 MG per tablet Take 1-2 tablets by mouth every 6 (six) hours as needed for moderate pain or severe pain. 01/23/15  Yes Fanny Skates, MD  losartan (COZAAR) 25 MG tablet Take 1 tablet (25 mg total) by mouth daily. 01/11/15  Yes Deboraha Sprang, MD  omeprazole (PRILOSEC) 40 MG capsule Take 40 mg by mouth 2 (two) times daily.   Yes Historical Provider, MD  EPINEPHrine (EPIPEN) 0.3 mg/0.3 mL SOAJ injection Inject 0.3 mg into the muscle once as needed (for allergies).    Historical Provider, MD  HYDROmorphone (DILAUDID) 4 MG tablet Take one every 4-6  hours for pain 01/28/15   Milton Ferguson, MD  oxyCODONE-acetaminophen (PERCOCET/ROXICET) 5-325 MG per tablet Take 1-2 tablets by mouth every 4 (four) hours as needed for moderate pain. 01/24/15   Fanny Skates, MD   BP 126/76 mmHg  Pulse 74  Temp(Src) 98.1 F (36.7 C) (Oral)  Resp 24  Ht 5\' 9"  (1.753 m)  Wt 180 lb (81.647 kg)  BMI 26.57 kg/m2  SpO2 99%  LMP 04/23/2015 Physical Exam  Constitutional: She is oriented to person, place, and time. She appears well-developed and well-nourished. No distress.  HENT:  Head: Normocephalic and atraumatic.  Mouth/Throat: Oropharynx is clear and moist. No oropharyngeal exudate.  Eyes:  Conjunctivae and EOM are normal. Pupils are equal, round, and reactive to light.  Neck: Normal range of motion. Neck supple.  No meningismus.  Cardiovascular: Normal rate, regular rhythm, normal heart sounds and intact distal pulses.   No murmur heard. Pulmonary/Chest: Effort normal and breath sounds normal. No respiratory distress.  Abdominal: Soft. There is no tenderness. There is no rebound and no guarding.  Musculoskeletal: Normal range of motion. She exhibits no edema or tenderness.  Neurological: She is alert and oriented to person, place, and time. No cranial nerve deficit. She exhibits normal muscle tone. Coordination normal.  No ataxia on finger to nose bilaterally. No pronator drift. 5/5 strength throughout. CN 2-12 intact. Negative Romberg. Equal grip strength. Sensation intact. Gait is normal.  Intact radial pulses bilaterally, intact cardinal hand movements. No appreciable weakness on examination of patient's left arm. Sensation is intact bilaterally.  Skin: Skin is warm.  Psychiatric: She has a normal mood and affect. Her behavior is normal.  Nursing note and vitals reviewed.   ED Course  Procedures (including critical care time) Labs Review Labs Reviewed  CBC - Abnormal; Notable for the following:    HCT 35.9 (*)    All other components within  normal limits  BASIC METABOLIC PANEL - Abnormal; Notable for the following:    Glucose, Bld 105 (*)    GFR calc non Af Amer 75 (*)    GFR calc Af Amer 87 (*)    All other components within normal limits  TROPONIN I  TROPONIN I    Imaging Review Dg Chest 2 View  04/27/2015   CLINICAL DATA:  Three-day history of left upper extremity pain and numbness  EXAM: CHEST  2 VIEW  COMPARISON:  March 30, 2015  FINDINGS: Lungs are clear. Heart size and pulmonary vascularity are normal. No adenopathy. No pneumothorax. No bone lesions.  IMPRESSION: No abnormality noted.   Electronically Signed   By: Lowella Grip III M.D.   On: 04/27/2015 19:33   Dg Cervical Spine Complete  04/27/2015   CLINICAL DATA:  Three-day history of left upper extremity pain and numbness  EXAM: CERVICAL SPINE  4+ VIEWS  COMPARISON:  None.  FINDINGS: Frontal, lateral, open-mouth odontoid, and bilateral oblique views were obtained. There is no fracture or spondylolisthesis. Prevertebral soft tissues and predental space regions are normal. Disc spaces appear intact. There is no appreciable facet arthropathy on the oblique views. There is relative lack of lordosis.  IMPRESSION: Relative lack of lordosis, a finding most likely indicative of muscle spasm. If there is clinical concern for ligamentous injury, lateral flexion-extension views could be helpful to further evaluate. No fracture or spondylolisthesis. No appreciable arthropathic change.   Electronically Signed   By: Lowella Grip III M.D.   On: 04/27/2015 19:35   Ct Head Wo Contrast  04/27/2015   CLINICAL DATA:  Left arm pain and numbness. Dizziness and blurry vision since Wednesday.  EXAM: CT HEAD WITHOUT CONTRAST  TECHNIQUE: Contiguous axial images were obtained from the base of the skull through the vertex without intravenous contrast.  COMPARISON:  11/30/2010  FINDINGS: There is no intra or extra-axial fluid collection or mass lesion. The basilar cisterns and ventricles have a  normal appearance. There is no CT evidence for acute infarction or hemorrhage. Bone windows are unremarkable.  IMPRESSION: Negative exam.   Electronically Signed   By: Nolon Nations M.D.   On: 04/27/2015 18:02   Mr Brain Wo Contrast  04/27/2015   CLINICAL DATA:  Left arm pain and numbness, 2 days duration.  EXAM: MRI HEAD WITHOUT CONTRAST  TECHNIQUE: Multiplanar, multiecho pulse sequences of the brain and surrounding structures were obtained without intravenous contrast.  COMPARISON:  Head CT same day.  MRI 07/09/2012.  FINDINGS: No change since the previous study. Again, the brainstem itself has a normal appearance. 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 The cerebral hemispheres do not show any malformation. No old or acute infarction, mass lesion, hemorrhage, hydrocephalus or extra-axial collection. As seen previously, the right cerebellar tonsil extends the foramen magnum by 5 mm. No pituitary mass. No inflammatory sinus disease.  IMPRESSION: No acute finding. 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 No supratentorial brain abnormality.   Electronically Signed   By: Nelson Chimes M.D.   On: 04/27/2015 21:38     EKG Interpretation   Date/Time:  Friday April 27 2015 13:59:10 EDT Ventricular Rate:  77 PR Interval:  172 QRS Duration: 90 QT Interval:  396 QTC Calculation: 448 R Axis:   2 Text Interpretation:  Normal sinus rhythm Possible Anterior infarct , age  undetermined Abnormal ECG No significant change was found Confirmed by  Wyvonnia Dusky  MD, Navaeh Kehres [phone]) on 04/27/2015 5:22:46 PM      MDM   Final diagnoses:  Left arm weakness    Intermittent left arm numbnes weakness, cold sensation for the past 3 days lasting several hours at a time. No appreciable weakness or sensory deficit on exam. No chest pain or shortness of breath. No headache or neck pain.  CT head negative. Cervical spine x-ray shows no areas of significant  stenosis  Patient has a normal neurological exam at this time. She reports feeling a cold sensation or weakness distal in her left arm. Imaging is normal. Discussed with Dr. Leonel Ramsay of neurology who does recommend MRI brain to rule out stroke.  He is not think patient needs admission for TIA as her symptoms have been ongoing for 3 days. MRI is negative for acute finding. Patient has no weakness or sensory deficit on exam.  She'll be treated symptomatically and be given referral to neurology. Return precautions discussed.  Ezequiel Essex, MD 04/28/15 [phone]

## 2015-05-02 ENCOUNTER — Emergency Department (HOSPITAL_COMMUNITY)
Admission: EM | Admit: 2015-05-02 | Discharge: 2015-05-02 | Disposition: A | Discharge status: Home / Self Care | Payer: Medicaid Other | Attending: Emergency Medicine | Admitting: Emergency Medicine

## 2015-05-02 ENCOUNTER — Encounter (HOSPITAL_COMMUNITY): Payer: Self-pay | Admitting: Emergency Medicine

## 2015-05-02 DIAGNOSIS — Z79899 Other long term (current) drug therapy: Secondary | ICD-10-CM | POA: Diagnosis not present

## 2015-05-02 DIAGNOSIS — Z720 Tobacco use: Secondary | ICD-10-CM | POA: Insufficient documentation

## 2015-05-02 DIAGNOSIS — M1990 Unspecified osteoarthritis, unspecified site: Secondary | ICD-10-CM | POA: Diagnosis not present

## 2015-05-02 DIAGNOSIS — R110 Nausea: Secondary | ICD-10-CM | POA: Diagnosis not present

## 2015-05-02 DIAGNOSIS — M797 Fibromyalgia: Secondary | ICD-10-CM | POA: Diagnosis not present

## 2015-05-02 DIAGNOSIS — Z9049 Acquired absence of other specified parts of digestive tract: Secondary | ICD-10-CM | POA: Insufficient documentation

## 2015-05-02 DIAGNOSIS — Z9851 Tubal ligation status: Secondary | ICD-10-CM | POA: Diagnosis not present

## 2015-05-02 DIAGNOSIS — R1011 Right upper quadrant pain: Secondary | ICD-10-CM | POA: Insufficient documentation

## 2015-05-02 DIAGNOSIS — F419 Anxiety disorder, unspecified: Secondary | ICD-10-CM | POA: Diagnosis not present

## 2015-05-02 DIAGNOSIS — J45909 Unspecified asthma, uncomplicated: Secondary | ICD-10-CM | POA: Insufficient documentation

## 2015-05-02 DIAGNOSIS — Z8619 Personal history of other infectious and parasitic diseases: Secondary | ICD-10-CM | POA: Diagnosis not present

## 2015-05-02 DIAGNOSIS — K219 Gastro-esophageal reflux disease without esophagitis: Secondary | ICD-10-CM | POA: Insufficient documentation

## 2015-05-02 DIAGNOSIS — I10 Essential (primary) hypertension: Secondary | ICD-10-CM | POA: Diagnosis not present

## 2015-05-02 DIAGNOSIS — R011 Cardiac murmur, unspecified: Secondary | ICD-10-CM | POA: Insufficient documentation

## 2015-05-02 DIAGNOSIS — Z8701 Personal history of pneumonia (recurrent): Secondary | ICD-10-CM | POA: Diagnosis not present

## 2015-05-02 DIAGNOSIS — F329 Major depressive disorder, single episode, unspecified: Secondary | ICD-10-CM | POA: Insufficient documentation

## 2015-05-02 LAB — URINALYSIS, ROUTINE W REFLEX MICROSCOPIC
Bilirubin Urine: NEGATIVE
Glucose, UA: NEGATIVE mg/dL
HGB URINE DIPSTICK: NEGATIVE
Ketones, ur: NEGATIVE mg/dL
LEUKOCYTES UA: NEGATIVE
Nitrite: NEGATIVE
Protein, ur: NEGATIVE mg/dL
SPECIFIC GRAVITY, URINE: 1.013 (ref 1.005–1.030)
UROBILINOGEN UA: 0.2 mg/dL (ref 0.0–1.0)
pH: 6 (ref 5.0–8.0)

## 2015-05-02 LAB — CBC WITH DIFFERENTIAL/PLATELET
Basophils Absolute: 0 10*3/uL (ref 0.0–0.1)
Basophils Relative: 0 % (ref 0–1)
Eosinophils Absolute: 0.1 10*3/uL (ref 0.0–0.7)
Eosinophils Relative: 2 % (ref 0–5)
HCT: 36.7 % (ref 36.0–46.0)
Hemoglobin: 12.1 g/dL (ref 12.0–15.0)
Lymphocytes Relative: 44 % (ref 12–46)
Lymphs Abs: 3.5 10*3/uL (ref 0.7–4.0)
MCH: 29.4 pg (ref 26.0–34.0)
MCHC: 33 g/dL (ref 30.0–36.0)
MCV: 89.3 fL (ref 78.0–100.0)
Monocytes Absolute: 0.5 10*3/uL (ref 0.1–1.0)
Monocytes Relative: 7 % (ref 3–12)
Neutro Abs: 3.7 10*3/uL (ref 1.7–7.7)
Neutrophils Relative %: 47 % (ref 43–77)
Platelets: 199 10*3/uL (ref 150–400)
RBC: 4.11 MIL/uL (ref 3.87–5.11)
RDW: 12.7 % (ref 11.5–15.5)
WBC: 7.9 10*3/uL (ref 4.0–10.5)

## 2015-05-02 LAB — COMPREHENSIVE METABOLIC PANEL
ALBUMIN: 4.2 g/dL (ref 3.5–5.0)
ALK PHOS: 78 U/L (ref 38–126)
ALT: 19 U/L (ref 14–54)
AST: 25 U/L (ref 15–41)
Anion gap: 8 (ref 5–15)
BUN: 15 mg/dL (ref 6–20)
CALCIUM: 9 mg/dL (ref 8.9–10.3)
CHLORIDE: 106 mmol/L (ref 101–111)
CO2: 26 mmol/L (ref 22–32)
CREATININE: 0.9 mg/dL (ref 0.44–1.00)
Glucose, Bld: 100 mg/dL — ABNORMAL HIGH (ref 70–99)
Potassium: 3.9 mmol/L (ref 3.5–5.1)
SODIUM: 140 mmol/L (ref 135–145)
Total Bilirubin: 0.7 mg/dL (ref 0.3–1.2)
Total Protein: 7.2 g/dL (ref 6.5–8.1)

## 2015-05-02 LAB — LIPASE, BLOOD: Lipase: 28 U/L (ref 22–51)

## 2015-05-02 MED ORDER — DIPHENHYDRAMINE HCL 50 MG/ML IJ SOLN
25.0000 mg | Freq: Once | INTRAMUSCULAR | Status: AC
  Administered 2015-05-02: 25 mg via INTRAVENOUS
  Filled 2015-05-02: qty 1

## 2015-05-02 MED ORDER — NAPROXEN 500 MG PO TABS: 500.0000 mg | ORAL_TABLET | Freq: Two times a day (BID) | ORAL | Status: DC

## 2015-05-02 MED ORDER — SODIUM CHLORIDE 0.9 % IV BOLUS (SEPSIS)
1000.0000 mL | INTRAVENOUS | Status: AC
  Administered 2015-05-02: 1000 mL via INTRAVENOUS

## 2015-05-02 MED ORDER — ONDANSETRON HCL 4 MG PO TABS: 4.0000 mg | ORAL_TABLET | Freq: Four times a day (QID) | ORAL | Status: DC

## 2015-05-02 MED ORDER — PROCHLORPERAZINE EDISYLATE 5 MG/ML IJ SOLN
10.0000 mg | Freq: Once | INTRAMUSCULAR | Status: AC
  Administered 2015-05-02: 10 mg via INTRAVENOUS
  Filled 2015-05-02: qty 2

## 2015-05-02 MED ORDER — KETOROLAC TROMETHAMINE 30 MG/ML IJ SOLN
30.0000 mg | Freq: Once | INTRAMUSCULAR | Status: AC
  Administered 2015-05-02: 30 mg via INTRAVENOUS
  Filled 2015-05-02: qty 1

## 2015-05-02 NOTE — ED Notes (Signed)
Pt reports lower left abdominal pain for a couple of months. Pt reports she had her gallbladder removed in Jan. Pt rate pain 9/10 and states she is nausuated with pain.

## 2015-05-02 NOTE — ED Provider Notes (Signed)
PROGRESS NOTE                                                                                                                 This is a sign-out from NP Tysinger at shift change: Raven [name] is a 34 y.o. female presenting with exacerbation of right upper quadrant pain which she's had since January status post cholecystectomy, pain has gotten worse over the last several days. No signs of systemic infection, patient is tolerating by mouth but she is nauseated. Plan is to follow-up basic blood work and asked patient to follow with her surgeon Dr. Dalbert Batman. Please refer to previous note for full HPI, ROS, PMH and PE.   No leukocytosis, electrolyte abnormality or abnormality in the urine. Patient seen and evaluated the bedside, she is resting comfortably, abdominal exam is benign with very mild tenderness to palpation of the right upper and right mid quadrant. No rebound, or guarding. Lung sounds are clear to auscultation and heart is a regular rate and rhythm without rubs or gallops. Advised this patient to follow closely with her surgeon, had an extensive discussion of return precautions and patient verbalizes understanding.  Filed Vitals:   05/02/15 0401  BP: 126/80  Pulse: 84  Temp: 97.4 F (36.3 C)  TempSrc: Oral  Resp: 15  SpO2: 99%    Medications  sodium chloride 0.9 % bolus 1,000 mL (1,000 mLs Intravenous New Bag/Given 05/02/15 0551)  ketorolac (TORADOL) 30 MG/ML injection 30 mg (30 mg Intravenous Given 05/02/15 0550)  prochlorperazine (COMPAZINE) injection 10 mg (10 mg Intravenous Given 05/02/15 0550)  diphenhydrAMINE (BENADRYL) injection 25 mg (25 mg Intravenous Given 05/02/15 0551)     New Prescriptions   NAPROXEN (NAPROSYN) 500 MG TABLET    Take 1 tablet (500 mg total) by mouth 2 (two) times daily.   ONDANSETRON (ZOFRAN) 4 MG TABLET    Take 1 tablet (4 mg total) by mouth every 6 (six) hours.          Monico Blitz, PA-C 05/02/15 [place], PA-C 05/02/15  [phone]  Julianne Rice, MD 05/10/15 [phone]

## 2015-05-02 NOTE — Discharge Instructions (Signed)
Please follow the directions provided.  Be sure to follow-up with your surgeon for further evaluation.  You may take the naproxen twice a day to help with pain, you may take the zofran as needed for nausea.  Don't hesitate to return for any new, worsening or concerning symptoms.     SEEK IMMEDIATE MEDICAL CARE IF:  Your pain does not go away within 2 hours.  You keep throwing up (vomiting).  Your pain is felt only in portions of the abdomen, such as the right side or the left lower portion of the abdomen.  You pass bloody or black tarry stools.

## 2015-05-02 NOTE — ED Provider Notes (Signed)
CSN: [id number]     Arrival date & time 05/02/15  0355 History   First MD Initiated Contact with Patient 05/02/15 0430     Chief Complaint  Patient presents with  . Abdominal Pain   (Consider location/radiation/quality/duration/timing/severity/associated sxs/prior Treatment) HPI  Raven [name] is a 34 yo female presenting with RUQ pain.  She states this pain has been ongoing since shortly after having her gall bladder removed in January but she has noticed the pain has gotten worse in the last 2 days.  She also reports nausea in the last 2 days also.  She notices the pain is worse after eating.  She denies any vomiting or diarrhea.  She also denies fevers, chills or vaginal symptoms.    Past Medical History  Diagnosis Date  . Achalasia     esphog stretched several times  . Abnormal Pap smear   . Depression   . HSV-2 infection   . PVC's (premature ventricular contractions)   . Anxiety   . Blood transfusion 01/2006    [place]  . GERD (gastroesophageal reflux disease)     no meds - diet controlled  . Asthma     rarely uses inhaler  . Arthritis   . History of abnormal Pap smear   . History of chlamydia 1990's  . Fibromyalgia   . Hiatal hernia   . Cardiomyopathy   . Status post dilation of esophageal narrowing   . HTN (hypertension)   . Heart murmur   . Dysrhythmia   . Shortness of breath dyspnea   . Pneumonia 05/2013  . Constipation due to opioid therapy    Past Surgical History  Procedure Laterality Date  . Tonsillectomy    . Adenoidectomy  12/2002  . Esophagogastroduodenoscopy      with esophageal dilation x 3  . Dilation and curettage of uterus  1999  . Breast surgery  07/2001    tumor removed right breast  . Dilation and curettage of uterus  01/2006    retained products  . Bladder suspension  06/11/2012    Procedure: TRANSVAGINAL TAPE (TVT) PROCEDURE;  Surgeon: Delice Lesch, MD;  Location: [facility];  Service: Gynecology;  Laterality: N/A;  Tension Free Vaginal  Tape/cystoscopy  . Laparoscopic tubal ligation  06/11/2012    Procedure: LAPAROSCOPIC TUBAL LIGATION;  Surgeon: Delice Lesch, MD;  Location: [facility];  Service: Gynecology;  Laterality: Bilateral;  Laparoscopic Bilateral Fulgeration  . Cystoscopy  06/11/2012    Procedure: CYSTOSCOPY;  Surgeon: Delice Lesch, MD;  Location: [facility];  Service: Gynecology;  Laterality: N/A;  . Foreign body removal  06/11/2012    Procedure: REMOVAL FOREIGN BODY EXTREMITY;  Surgeon: Delice Lesch, MD;  Location: [facility];  Service: Gynecology;  Laterality: Right;  Removal of Implanon   . Tubal ligation    . Cholecystectomy N/A 01/23/2015    Procedure: LAPAROSCOPIC CHOLECYSTECTOMY ;  Surgeon: Fanny Skates, MD;  Location: [facility];  Service: General;  Laterality: N/A;  case booked with cholangiogram but MD did not complete intraoperative cholangiogram    Family History  Problem Relation Age of Onset  . Heart disease Father   . Hypertension Father   . Stroke Mother   . Hypertension Mother   . Heart attack Mother   . Breast cancer Mother   . Neuropathy Mother   . Arthritis Mother   . Neuropathy Sister   . Arthritis Sister   . Asthma Mother   . Asthma Sister  History  Substance Use Topics  . Smoking status: Current Some Day Smoker -- 0.40 packs/day for 15 years    Types: Cigarettes  . Smokeless tobacco: Never Used     Comment: HAS CUT BACK  . Alcohol Use: No     Comment: on occasion   OB History    Gravida Para Term Preterm AB TAB SAB Ectopic Multiple Living   4 2 2  0 2  2   2      Review of Systems  Constitutional: Negative for fever and chills.  HENT: Negative for sore throat.   Eyes: Negative for visual disturbance.  Respiratory: Negative for cough and shortness of breath.   Cardiovascular: Negative for chest pain and leg swelling.  Gastrointestinal: Positive for nausea and abdominal pain. Negative for vomiting and diarrhea.  Genitourinary: Negative for dysuria.  Musculoskeletal: Negative  for myalgias.  Skin: Negative for rash.  Neurological: Negative for weakness, numbness and headaches.      Allergies  Dust mite extract; Other; and Pollen extract  Home Medications   Prior to Admission medications   Medication Sig Start Date End Date Taking? Authorizing Provider  albuterol (PROVENTIL HFA;VENTOLIN HFA) 108 (90 BASE) MCG/ACT inhaler Inhale 2 puffs into the lungs every 6 (six) hours as needed for wheezing or shortness of breath. 11/10/14   Rolland Porter, MD  clonazePAM (KLONOPIN) 1 MG tablet Take 1 mg by mouth at bedtime as needed. For anxiety    Historical Provider, MD  EPINEPHrine (EPIPEN) 0.3 mg/0.3 mL SOAJ injection Inject 0.3 mg into the muscle once as needed (for allergies).    Historical Provider, MD  HYDROcodone-acetaminophen (NORCO) 5-325 MG per tablet Take 1-2 tablets by mouth every 6 (six) hours as needed for moderate pain or severe pain. 01/23/15   Fanny Skates, MD  HYDROmorphone (DILAUDID) 4 MG tablet Take one every 4-6 hours for pain 01/28/15   Milton Ferguson, MD  losartan (COZAAR) 25 MG tablet Take 1 tablet (25 mg total) by mouth daily. 01/11/15   Deboraha Sprang, MD  omeprazole (PRILOSEC) 40 MG capsule Take 40 mg by mouth 2 (two) times daily.    Historical Provider, MD  oxyCODONE-acetaminophen (PERCOCET/ROXICET) 5-325 MG per tablet Take 1-2 tablets by mouth every 4 (four) hours as needed for moderate pain. 01/24/15   Fanny Skates, MD   BP 126/80 mmHg  Pulse 84  Temp(Src) 97.4 F (36.3 C) (Oral)  Resp 15  SpO2 99%  LMP 04/23/2015 Physical Exam  Constitutional: She appears well-developed and well-nourished. No distress.  HENT:  Head: Normocephalic and atraumatic.  Mouth/Throat: Oropharynx is clear and moist. No oropharyngeal exudate.  Eyes: Conjunctivae are normal.  Neck: Neck supple. No thyromegaly present.  Cardiovascular: Normal rate, regular rhythm and intact distal pulses.   Pulmonary/Chest: Effort normal and breath sounds normal. No respiratory  distress. She has no wheezes. She has no rales. She exhibits no tenderness.  Abdominal: Soft. She exhibits no distension and no mass. There is no hepatosplenomegaly. There is tenderness in the right upper quadrant. There is no rigidity, no rebound, no guarding, no CVA tenderness, no tenderness at McBurney's point and negative Murphy's sign.  Musculoskeletal: She exhibits no tenderness.  Lymphadenopathy:    She has no cervical adenopathy.  Neurological: She is alert.  Skin: Skin is warm and dry. No rash noted. She is not diaphoretic.  Psychiatric: She has a normal mood and affect.  Nursing note and vitals reviewed.   ED Course  Procedures (including critical care time) Labs Review  Labs Reviewed  CBC WITH DIFFERENTIAL/PLATELET  COMPREHENSIVE METABOLIC PANEL  URINALYSIS, ROUTINE W REFLEX MICROSCOPIC  LIPASE, BLOOD  POC URINE PREG, ED    Imaging Review No results found.   EKG Interpretation None      MDM   Final diagnoses:  RUQ pain   34 yo with persistent RUQ pain for several months since cholecystectomy. CBC, CMP, Lipase, UA, NS bolus and meds for pain and nausea.  Discussed with pt's will evaluate for emergent process and manage symptoms.  If no emergent process noted, pt will be discharged to follow-up with surgeon.   6:00AM: At end of shift, hand-off report given to [facility], PA-C. Plan includes reviewing labs for significant abnormalities and disposition appropriately.   Filed Vitals:   05/02/15 0401  BP: 126/80  Pulse: 84  Temp: 97.4 F (36.3 C)  TempSrc: Oral  Resp: 15  SpO2: 99%   Meds given in ED:  Medications  sodium chloride 0.9 % bolus 1,000 mL (0 mLs Intravenous Stopped 05/02/15 0816)  ketorolac (TORADOL) 30 MG/ML injection 30 mg (30 mg Intravenous Given 05/02/15 0550)  prochlorperazine (COMPAZINE) injection 10 mg (10 mg Intravenous Given 05/02/15 0550)  diphenhydrAMINE (BENADRYL) injection 25 mg (25 mg Intravenous Given 05/02/15 0551)    New  Prescriptions   No medications on file       Britt Bottom, NP 05/02/15 1758  Julianne Rice, MD 05/10/15 2312

## 2015-05-03 ENCOUNTER — Other Ambulatory Visit (INDEPENDENT_AMBULATORY_CARE_PROVIDER_SITE_OTHER): Payer: Self-pay | Admitting: General Surgery

## 2015-05-03 ENCOUNTER — Other Ambulatory Visit: Payer: Self-pay | Admitting: *Deleted

## 2015-05-03 DIAGNOSIS — R1011 Right upper quadrant pain: Secondary | ICD-10-CM

## 2015-05-03 NOTE — Addendum Note (Signed)
Addended by: [name] on: 05/03/2015 12:11 PM   Modules accepted: Orders

## 2015-05-04 ENCOUNTER — Other Ambulatory Visit: Payer: Self-pay | Admitting: *Deleted

## 2015-05-04 DIAGNOSIS — R1011 Right upper quadrant pain: Secondary | ICD-10-CM

## 2015-05-10 ENCOUNTER — Ambulatory Visit
Admission: RE | Admit: 2015-05-10 | Discharge: 2015-05-10 | Disposition: A | Discharge status: Home / Self Care | Payer: Medicaid Other | Source: Ambulatory Visit | Attending: General Surgery | Admitting: General Surgery

## 2015-05-10 MED ORDER — IOPAMIDOL (ISOVUE-300) INJECTION 61%
100.0000 mL | Freq: Once | INTRAVENOUS | Status: AC | PRN
  Administered 2015-05-10: 100 mL via INTRAVENOUS

## 2015-05-11 ENCOUNTER — Emergency Department (HOSPITAL_COMMUNITY)
Admission: EM | Admit: 2015-05-11 | Discharge: 2015-05-11 | Disposition: A | Discharge status: Home / Self Care | Payer: Medicaid Other | Source: Home / Self Care | Attending: Family Medicine | Admitting: Family Medicine

## 2015-05-11 ENCOUNTER — Encounter (HOSPITAL_COMMUNITY): Payer: Self-pay | Admitting: Emergency Medicine

## 2015-05-11 DIAGNOSIS — N644 Mastodynia: Secondary | ICD-10-CM

## 2015-05-11 LAB — POCT PREGNANCY, URINE: Preg Test, Ur: NEGATIVE

## 2015-05-11 MED ORDER — NAPROXEN 375 MG PO TABS: 375.0000 mg | ORAL_TABLET | Freq: Two times a day (BID) | ORAL | Status: DC

## 2015-05-11 NOTE — Discharge Instructions (Signed)
Thank you for coming in today. Follow up with your doctor if not better.    Breast Tenderness Breast tenderness is a common problem for women of all ages. Breast tenderness may cause mild discomfort to severe pain. It has a variety of causes. Your health care provider will find out the likely cause of your breast tenderness by examining your breasts, asking you about symptoms, and ordering some tests. Breast tenderness usually does not mean you have breast cancer. HOME CARE INSTRUCTIONS  Breast tenderness often can be handled at home. You can try:  Getting fitted for a new bra that provides more support, especially during exercise.  Wearing a more supportive bra or sports bra while sleeping when your breasts are very tender.  If you have a breast injury, apply ice to the area:  Put ice in a plastic bag.  Place a towel between your skin and the bag.  Leave the ice on for 20 minutes, 2-3 times a day.  If your breasts are too full of milk as a result of breastfeeding, try:  Expressing milk either by hand or with a breast pump.  Applying a warm compress to the breasts for relief.  Taking over-the-counter pain relievers, if approved by your health care provider.  Taking other medicines that your health care provider prescribes. These may include antibiotic medicines or birth control pills. Over the long term, your breast tenderness might be eased if you:  Cut down on caffeine.  Reduce the amount of fat in your diet. Keep a log of the days and times when your breasts are most tender. This will help you and your health care provider find the cause of the tenderness and how to relieve it. Also, learn how to do breast exams at home. This will help you notice if you have an unusual growth or lump that could cause tenderness. SEEK MEDICAL CARE IF:   Any part of your breast is hard, red, and hot to the touch. This could be a sign of infection.  Fluid is coming out of your nipples (and you  are not breastfeeding). Especially watch for blood or pus.  You have a fever as well as breast tenderness.  You have a new or painful lump in your breast that remains after your menstrual period ends.  You have tried to take care of the pain at home, but it has not gone away.  Your breast pain is getting worse, or the pain is making it hard to do the things you usually do during your day. Document Released: 11/27/2008 Document Revised: 08/17/2013 Document Reviewed: 07/14/2013 [facility] Patient Information 2015 [place], [place]. This information is not intended to replace advice given to you by your health care provider. Make sure you discuss any questions you have with your health care provider.

## 2015-05-11 NOTE — ED Provider Notes (Signed)
Raven [name] is a 34 y.o. female who presents to Urgent Care today for breast pain. Patient has bilateral breast pain present for the last 4 days. Pain is started without injury. She notes that her breasts feel swollen and full. She denies any masses. She has not tried any treatment yet. No fevers or chills nausea vomiting or diarrhea. She has a I lateral tubal ligation. Last menstrual period Was April 30.   Past Medical History  Diagnosis Date  . Achalasia     esphog stretched several times  . Abnormal Pap smear   . Depression   . HSV-2 infection   . PVC's (premature ventricular contractions)   . Anxiety   . Blood transfusion 01/2006    [place]  . GERD (gastroesophageal reflux disease)     no meds - diet controlled  . Asthma     rarely uses inhaler  . Arthritis   . History of abnormal Pap smear   . History of chlamydia 1990's  . Fibromyalgia   . Hiatal hernia   . Cardiomyopathy   . Status post dilation of esophageal narrowing   . HTN (hypertension)   . Heart murmur   . Dysrhythmia   . Shortness of breath dyspnea   . Pneumonia 05/2013  . Constipation due to opioid therapy    Past Surgical History  Procedure Laterality Date  . Tonsillectomy    . Adenoidectomy  12/2002  . Esophagogastroduodenoscopy      with esophageal dilation x 3  . Dilation and curettage of uterus  1999  . Breast surgery  07/2001    tumor removed right breast  . Dilation and curettage of uterus  01/2006    retained products  . Bladder suspension  06/11/2012    Procedure: TRANSVAGINAL TAPE (TVT) PROCEDURE;  Surgeon: Delice Lesch, MD;  Location: [facility];  Service: Gynecology;  Laterality: N/A;  Tension Free Vaginal Tape/cystoscopy  . Laparoscopic tubal ligation  06/11/2012    Procedure: LAPAROSCOPIC TUBAL LIGATION;  Surgeon: Delice Lesch, MD;  Location: [facility];  Service: Gynecology;  Laterality: Bilateral;  Laparoscopic Bilateral Fulgeration  . Cystoscopy  06/11/2012    Procedure: CYSTOSCOPY;  Surgeon:  Delice Lesch, MD;  Location: [facility];  Service: Gynecology;  Laterality: N/A;  . Foreign body removal  06/11/2012    Procedure: REMOVAL FOREIGN BODY EXTREMITY;  Surgeon: Delice Lesch, MD;  Location: [facility];  Service: Gynecology;  Laterality: Right;  Removal of Implanon   . Tubal ligation    . Cholecystectomy N/A 01/23/2015    Procedure: LAPAROSCOPIC CHOLECYSTECTOMY ;  Surgeon: Fanny Skates, MD;  Location: [facility];  Service: General;  Laterality: N/A;  case booked with cholangiogram but MD did not complete intraoperative cholangiogram    History  Substance Use Topics  . Smoking status: Current Some Day Smoker -- 0.40 packs/day for 15 years    Types: Cigarettes  . Smokeless tobacco: Never Used     Comment: HAS CUT BACK  . Alcohol Use: No     Comment: on occasion   ROS as above Medications: No current facility-administered medications for this encounter.   Current Outpatient Prescriptions  Medication Sig Dispense Refill  . albuterol (PROVENTIL HFA;VENTOLIN HFA) 108 (90 BASE) MCG/ACT inhaler Inhale 2 puffs into the lungs every 6 (six) hours as needed for wheezing or shortness of breath. 1 Inhaler 0  . Aripiprazole 1 MG/ML mL Take 10 mg by mouth daily.    . clonazePAM (KLONOPIN) 1 MG tablet  Take 1 mg by mouth at bedtime as needed. For anxiety    . EPINEPHrine (EPIPEN) 0.3 mg/0.3 mL SOAJ injection Inject 0.3 mg into the muscle once as needed (for allergies).    Marland Kitchen HYDROcodone-acetaminophen (NORCO) 5-325 MG per tablet Take 1-2 tablets by mouth every 6 (six) hours as needed for moderate pain or severe pain. (Patient not taking: Reported on 05/02/2015) 30 tablet 0  . HYDROcodone-ibuprofen (VICOPROFEN) 7.5-200 MG per tablet Take 1 tablet by mouth every 8 (eight) hours as needed for moderate pain.    Marland Kitchen HYDROmorphone (DILAUDID) 4 MG tablet Take one every 4-6 hours for pain (Patient not taking: Reported on 05/02/2015) 20 tablet 0  . losartan (COZAAR) 25 MG tablet Take 1 tablet (25 mg total) by mouth  daily. 90 tablet 3  . naproxen (NAPROSYN) 375 MG tablet Take 1 tablet (375 mg total) by mouth 2 (two) times daily. 20 tablet 0  . omeprazole (PRILOSEC) 40 MG capsule Take 40 mg by mouth 2 (two) times daily.    . ondansetron (ZOFRAN) 4 MG tablet Take 1 tablet (4 mg total) by mouth every 6 (six) hours. 12 tablet 0  . oxyCODONE-acetaminophen (PERCOCET/ROXICET) 5-325 MG per tablet Take 1-2 tablets by mouth every 4 (four) hours as needed for moderate pain. (Patient not taking: Reported on 05/02/2015) 30 tablet 0   Allergies  Allergen Reactions  . Dust Mite Extract Shortness Of Breath  . Other Shortness Of Breath    Feathers, trees "almost everything outside"  . Pollen Extract Shortness Of Breath     Exam:  BP 142/86 mmHg  Pulse 72  Temp(Src) 98.1 F (36.7 C) (Oral)  Resp 16  SpO2 98%  LMP 04/23/2015 Gen: Well NAD HEENT: EOMI,  MMM Lungs: Normal work of breathing. CTABL Heart: RRR no MRG Abd: NABS, Soft. Nondistended, Nontender Exts: Brisk capillary refill, warm and well perfused.  Breast: Bilateral breasts are normal appearing without skin changes. No discharge present. Small tender masses are present in the breast tissue bilaterally. They're mobile and consistent with fibrocystic changes.   Results for orders placed or performed during the hospital encounter of 05/11/15 (from the past 24 hour(s))  Pregnancy, urine POC     Status: None   Collection Time: 05/11/15  8:38 AM  Result Value Ref Range   Preg Test, Ur NEGATIVE NEGATIVE     Assessment and Plan: 34 y.o. female with breast pain. Likely normal hormonal fluctuation. She has some fibrocystic changes on exam. Treat with naproxen and follow-up with PCP. If symptoms persist would recommend diagnostic mammogram.  Discussed warning signs or symptoms. Please see discharge instructions. Patient expresses understanding.     Gregor Hams, MD 05/11/15 [phone]

## 2015-05-11 NOTE — ED Notes (Signed)
Patient c/o bilateral breast pain. Patient reports her breast feel engorged, heavy and painful. Also feels warm to the touch. Denies any discharge. Patient reports she had a benign tumor removed in 2001. Patient is in NAD.

## 2015-10-16 ENCOUNTER — Encounter: Payer: Self-pay | Admitting: Gastroenterology

## 2016-02-01 ENCOUNTER — Inpatient Hospital Stay (HOSPITAL_COMMUNITY)
Admission: AD | Admit: 2016-02-01 | Discharge: 2016-02-02 | Disposition: A | Discharge status: Home / Self Care | Payer: Medicaid Other | Source: Ambulatory Visit | Attending: Obstetrics & Gynecology | Admitting: Obstetrics & Gynecology

## 2016-02-01 DIAGNOSIS — F17210 Nicotine dependence, cigarettes, uncomplicated: Secondary | ICD-10-CM | POA: Insufficient documentation

## 2016-02-01 DIAGNOSIS — N898 Other specified noninflammatory disorders of vagina: Secondary | ICD-10-CM | POA: Diagnosis present

## 2016-02-01 DIAGNOSIS — N939 Abnormal uterine and vaginal bleeding, unspecified: Secondary | ICD-10-CM | POA: Insufficient documentation

## 2016-02-01 LAB — POCT PREGNANCY, URINE: Preg Test, Ur: NEGATIVE

## 2016-02-01 NOTE — MAU Note (Addendum)
This afternoon had abd cramping which has continued. Some vag itching. Pink vag d/c tonight when i wipe. Had BTL in 2013 and took upt and was negative. Usually have regular periods

## 2016-02-02 ENCOUNTER — Encounter (HOSPITAL_COMMUNITY): Payer: Self-pay | Admitting: *Deleted

## 2016-02-02 DIAGNOSIS — N939 Abnormal uterine and vaginal bleeding, unspecified: Secondary | ICD-10-CM | POA: Diagnosis not present

## 2016-02-02 LAB — WET PREP, GENITAL
Clue Cells Wet Prep HPF POC: NONE SEEN
Sperm: NONE SEEN
Trich, Wet Prep: NONE SEEN
WBC, Wet Prep HPF POC: NONE SEEN
Yeast Wet Prep HPF POC: NONE SEEN

## 2016-02-02 LAB — URINE MICROSCOPIC-ADD ON

## 2016-02-02 LAB — URINALYSIS, ROUTINE W REFLEX MICROSCOPIC
Bilirubin Urine: NEGATIVE
Glucose, UA: NEGATIVE mg/dL
Ketones, ur: NEGATIVE mg/dL
Leukocytes, UA: NEGATIVE
Nitrite: NEGATIVE
Protein, ur: NEGATIVE mg/dL
Specific Gravity, Urine: 1.01 (ref 1.005–1.030)
pH: 5.5 (ref 5.0–8.0)

## 2016-02-02 NOTE — Progress Notes (Signed)
Lori Clemmons CNM notified of pt's admission and status. Aware of negative upt.

## 2016-02-02 NOTE — Discharge Instructions (Signed)

## 2016-02-02 NOTE — MAU Provider Note (Signed)
History     CSN: [id number]  Arrival date and time: 02/01/16 2306   First Provider Initiated Contact with Patient 02/02/16 0029      Chief Complaint  Patient presents with  . Vaginal Discharge  . Abdominal Pain   HPI  Raven [name] 35 y.o. [id number] presents with c/o of vaginal discharge with slight pink tinge earlier today and abdominal cramping for 3 days  Past Medical History  Diagnosis Date  . Achalasia     esphog stretched several times  . Abnormal Pap smear   . Depression   . HSV-2 infection   . PVC's (premature ventricular contractions)   . Anxiety   . Blood transfusion 01/2006    [place]  . GERD (gastroesophageal reflux disease)     no meds - diet controlled  . Asthma     rarely uses inhaler  . Arthritis   . History of abnormal Pap smear   . History of chlamydia 1990's  . Fibromyalgia   . Hiatal hernia   . Cardiomyopathy ([place])   . Status post dilation of esophageal narrowing   . HTN (hypertension)   . Heart murmur   . Dysrhythmia   . Shortness of breath dyspnea   . Pneumonia 05/2013  . Constipation due to opioid therapy     Past Surgical History  Procedure Laterality Date  . Tonsillectomy    . Adenoidectomy  12/2002  . Esophagogastroduodenoscopy      with esophageal dilation x 3  . Dilation and curettage of uterus  1999  . Breast surgery  07/2001    tumor removed right breast  . Dilation and curettage of uterus  01/2006    retained products  . Bladder suspension  06/11/2012    Procedure: TRANSVAGINAL TAPE (TVT) PROCEDURE;  Surgeon: Delice Lesch, MD;  Location: [facility];  Service: Gynecology;  Laterality: N/A;  Tension Free Vaginal Tape/cystoscopy  . Laparoscopic tubal ligation  06/11/2012    Procedure: LAPAROSCOPIC TUBAL LIGATION;  Surgeon: Delice Lesch, MD;  Location: [facility];  Service: Gynecology;  Laterality: Bilateral;  Laparoscopic Bilateral Fulgeration  . Cystoscopy  06/11/2012    Procedure: CYSTOSCOPY;  Surgeon: Delice Lesch, MD;  Location:  [facility];  Service: Gynecology;  Laterality: N/A;  . Foreign body removal  06/11/2012    Procedure: REMOVAL FOREIGN BODY EXTREMITY;  Surgeon: Delice Lesch, MD;  Location: [facility];  Service: Gynecology;  Laterality: Right;  Removal of Implanon   . Tubal ligation    . Cholecystectomy N/A 01/23/2015    Procedure: LAPAROSCOPIC CHOLECYSTECTOMY ;  Surgeon: Fanny Skates, MD;  Location: [facility];  Service: General;  Laterality: N/A;  case booked with cholangiogram but MD did not complete intraoperative cholangiogram     Family History  Problem Relation Age of Onset  . Heart disease Father   . Hypertension Father   . Stroke Mother   . Hypertension Mother   . Heart attack Mother   . Breast cancer Mother   . Neuropathy Mother   . Arthritis Mother   . Neuropathy Sister   . Arthritis Sister   . Asthma Mother   . Asthma Sister     Social History  Substance Use Topics  . Smoking status: Current Some Day Smoker -- 0.40 packs/day for 15 years    Types: Cigarettes  . Smokeless tobacco: Never Used     Comment: HAS CUT BACK  . Alcohol Use: No     Comment: on occasion  Allergies:  Allergies  Allergen Reactions  . Dust Mite Extract Shortness Of Breath  . Other Shortness Of Breath    Feathers, trees "almost everything outside"  . Pollen Extract Shortness Of Breath    Prescriptions prior to admission  Medication Sig Dispense Refill Last Dose  . Aripiprazole 1 MG/ML mL Take 10 mg by mouth daily.   Past Week at Unknown time  . clonazePAM (KLONOPIN) 1 MG tablet Take 1 mg by mouth at bedtime as needed. For anxiety   Past Week at Unknown time  . HYDROcodone-acetaminophen (NORCO) 5-325 MG per tablet Take 1-2 tablets by mouth every 6 (six) hours as needed for moderate pain or severe pain. 30 tablet 0 Past Week at Unknown time  . losartan (COZAAR) 25 MG tablet Take 1 tablet (25 mg total) by mouth daily. 90 tablet 3 Past Week at Unknown time  . albuterol (PROVENTIL HFA;VENTOLIN HFA) 108 (90 BASE)  MCG/ACT inhaler Inhale 2 puffs into the lungs every 6 (six) hours as needed for wheezing or shortness of breath. 1 Inhaler 0 Unknown at Unknown time  . EPINEPHrine (EPIPEN) 0.3 mg/0.3 mL SOAJ injection Inject 0.3 mg into the muscle once as needed (for allergies).   unknown at unknown  . HYDROcodone-ibuprofen (VICOPROFEN) 7.5-200 MG per tablet Take 1 tablet by mouth every 8 (eight) hours as needed for moderate pain.   Past Month at Unknown time  . HYDROmorphone (DILAUDID) 4 MG tablet Take one every 4-6 hours for pain (Patient not taking: Reported on 05/02/2015) 20 tablet 0   . naproxen (NAPROSYN) 375 MG tablet Take 1 tablet (375 mg total) by mouth 2 (two) times daily. 20 tablet 0   . omeprazole (PRILOSEC) 40 MG capsule Take 40 mg by mouth 2 (two) times daily.   Past Week at Unknown time  . ondansetron (ZOFRAN) 4 MG tablet Take 1 tablet (4 mg total) by mouth every 6 (six) hours. 12 tablet 0   . oxyCODONE-acetaminophen (PERCOCET/ROXICET) 5-325 MG per tablet Take 1-2 tablets by mouth every 4 (four) hours as needed for moderate pain. (Patient not taking: Reported on 05/02/2015) 30 tablet 0 01/27/2015 at 2030    Review of Systems  Constitutional: Negative for fever.  Gastrointestinal: Positive for abdominal pain.  Genitourinary:       Vaginal discharge  All other systems reviewed and are negative.  Physical Exam   Blood pressure 132/80, pulse 88, temperature 98.1 F (36.7 C), resp. rate 20, height 5\' 9"  (1.753 m), weight 100.608 kg (221 lb 12.8 oz), last menstrual period 01/08/2016.  Physical Exam  Nursing note and vitals reviewed. Constitutional: She appears well-developed and well-nourished. No distress.  Cardiovascular: Normal rate.   Respiratory: Effort normal. No respiratory distress.  GI: Soft. She exhibits no distension and no mass. There is no tenderness. There is no rebound and no guarding.  Genitourinary: Vaginal discharge found.  Pink vaginal discharge  Musculoskeletal: Normal range of  motion.  Skin: Skin is warm and dry.  Psychiatric: She has a normal mood and affect. Her behavior is normal. Judgment and thought content normal.   Results for orders placed or performed during the hospital encounter of 02/01/16 (from the past 24 hour(s))  Urinalysis, Routine w reflex microscopic (not at [facility])     Status: Abnormal   Collection Time: 02/01/16 11:22 PM  Result Value Ref Range   Color, Urine YELLOW YELLOW   APPearance CLEAR CLEAR   Specific Gravity, Urine 1.010 1.005 - 1.030   pH 5.5 5.0 -  8.0   Glucose, UA NEGATIVE NEGATIVE mg/dL   Hgb urine dipstick TRACE (A) NEGATIVE   Bilirubin Urine NEGATIVE NEGATIVE   Ketones, ur NEGATIVE NEGATIVE mg/dL   Protein, ur NEGATIVE NEGATIVE mg/dL   Nitrite NEGATIVE NEGATIVE   Leukocytes, UA NEGATIVE NEGATIVE  Urine microscopic-add on     Status: Abnormal   Collection Time: 02/01/16 11:22 PM  Result Value Ref Range   Squamous Epithelial / LPF 0-5 (A) NONE SEEN   WBC, UA 0-5 0 - 5 WBC/hpf   RBC / HPF 0-5 0 - 5 RBC/hpf   Bacteria, UA FEW (A) NONE SEEN  Pregnancy, urine POC     Status: None   Collection Time: 02/01/16 11:54 PM  Result Value Ref Range   Preg Test, Ur NEGATIVE NEGATIVE  Wet prep, genital     Status: None   Collection Time: 02/02/16 12:30 AM  Result Value Ref Range   Yeast Wet Prep HPF POC NONE SEEN NONE SEEN   Trich, Wet Prep NONE SEEN NONE SEEN   Clue Cells Wet Prep HPF POC NONE SEEN NONE SEEN   WBC, Wet Prep HPF POC NONE SEEN NONE SEEN   Sperm NONE SEEN    MAU Course  Procedures  MDM Will treat appropriately based on wet prep. Wet Prep is negative. Will do UA and send of for culture/gc ch cx's  Assessment and Plan  Vaginal discharge Abnormal Uterine Bleeding  Discharge  Shanon Seawright Grissett 02/02/2016, 12:38 AM

## 2016-02-02 NOTE — Progress Notes (Signed)
Lori Clemmons CNM in earlier to discuss d/c plan. Written and verbal d/c instructions given and understanding voiced. 

## 2016-06-06 IMAGING — CT CT ABD-PELV W/ CM
2 of 4 series · 11 of 46 positions shown, 12 images · IV contrast (Iodine)
Comparison: CT of the abdomen and pelvis January 22, 2014

CLINICAL DATA: RIGHT upper quadrant pain for 2-3 weeks, worse when
eating.

EXAM:
CT ABDOMEN AND PELVIS WITH CONTRAST
TECHNIQUE: Multidetector CT imaging of the abdomen and pelvis was performed
using the standard protocol following bolus administration of
intravenous contrast.
CONTRAST:  100mL OMNIPAQUE IOHEXOL 300 MG/ML  SOLN

[Series 201: routine, idose (2) · axial · 0.68mm/px · z∈[-649,-239]mm · 8 of 98 slices shown, 9 images]
[im 8/98  soft-tissue]
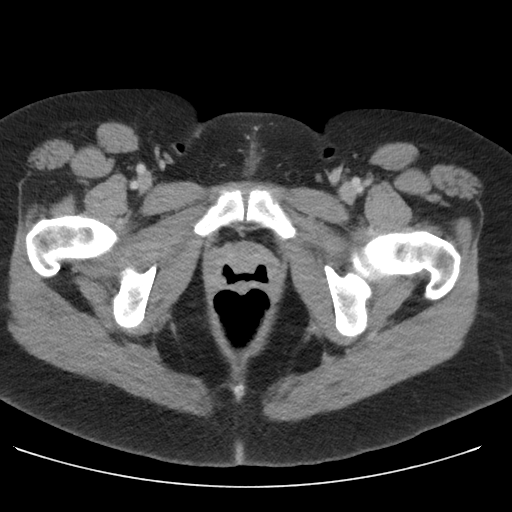
[im 8/98  bone]
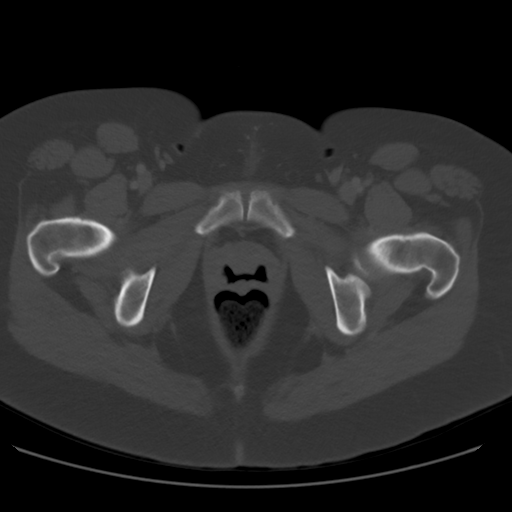
[im 19/98  soft-tissue]
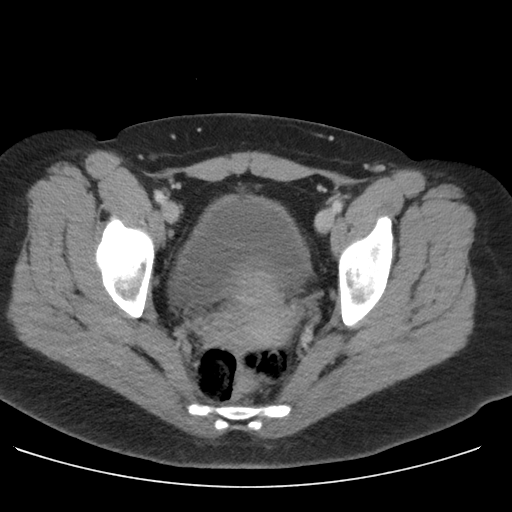
[im 30/98  soft-tissue]
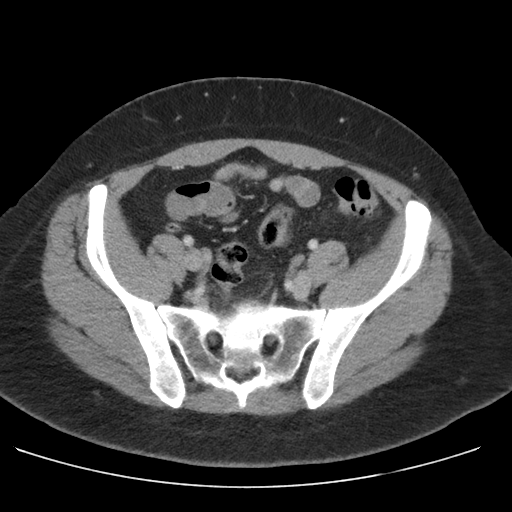
[im 42/98  soft-tissue]
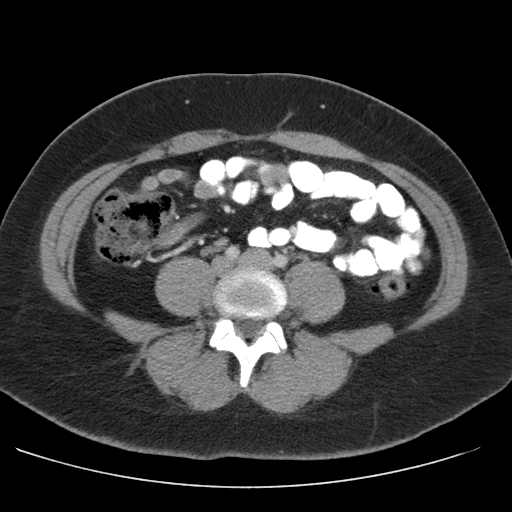
[im 56/98  soft-tissue]
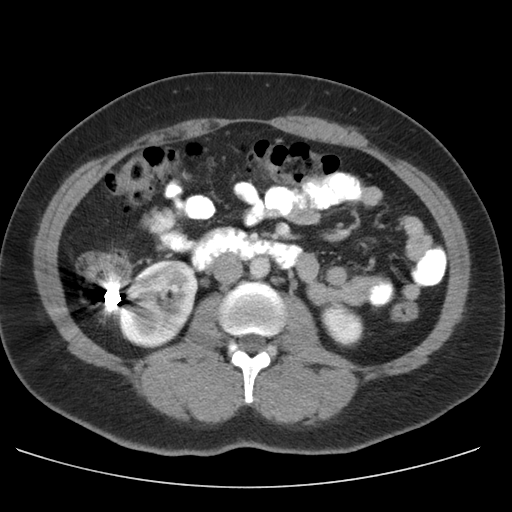
[im 68/98  soft-tissue]
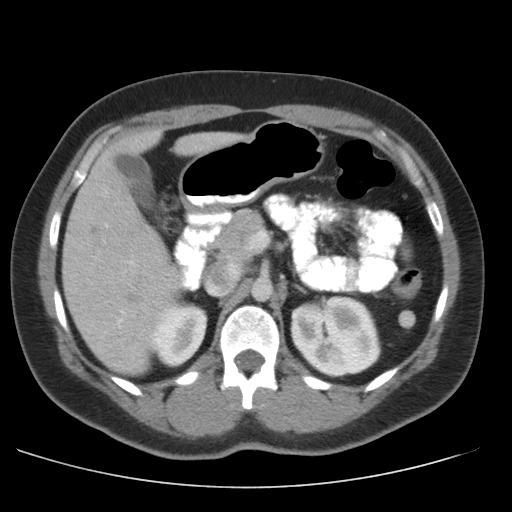
[im 79/98  soft-tissue]
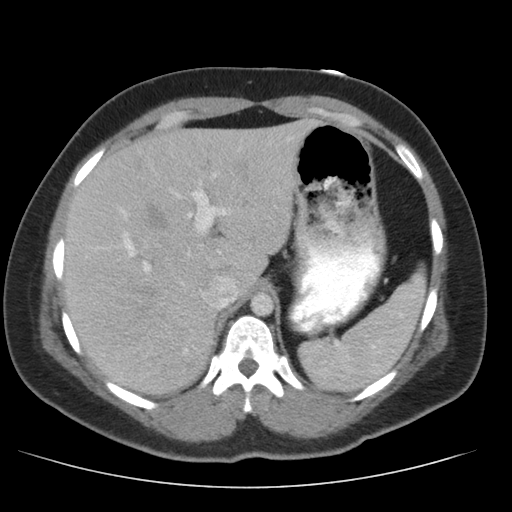
[im 90/98  soft-tissue]
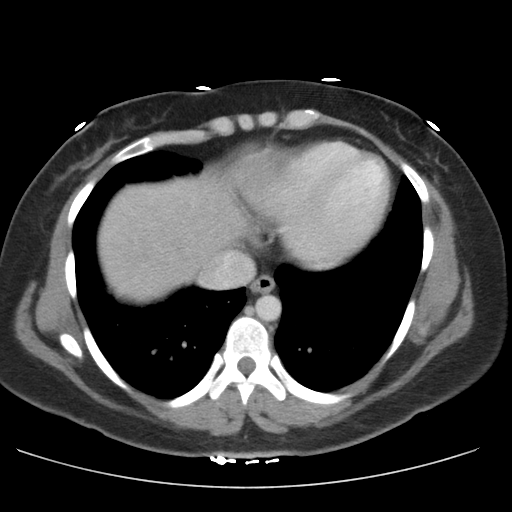

[Series 202: coronals, idose (2) · coronal · 0.45mm/px · 3 of 92 slices shown]
[im 31/92  soft-tissue]
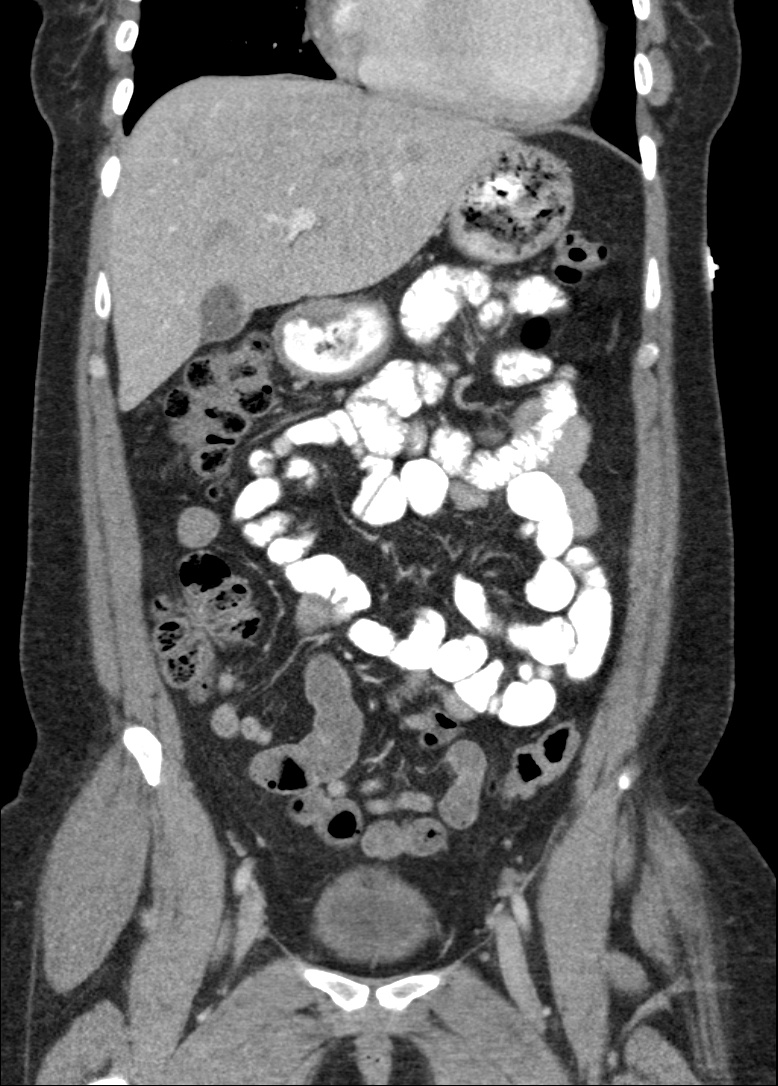
[im 41/92  soft-tissue]
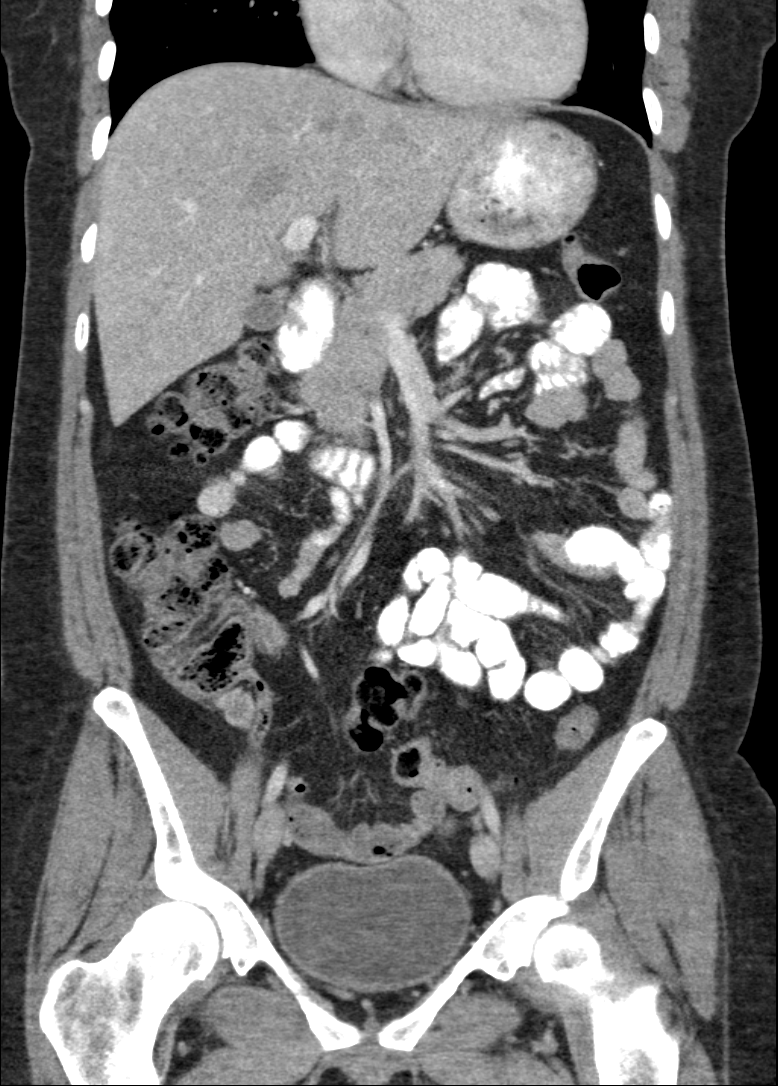
[im 51/92  soft-tissue]
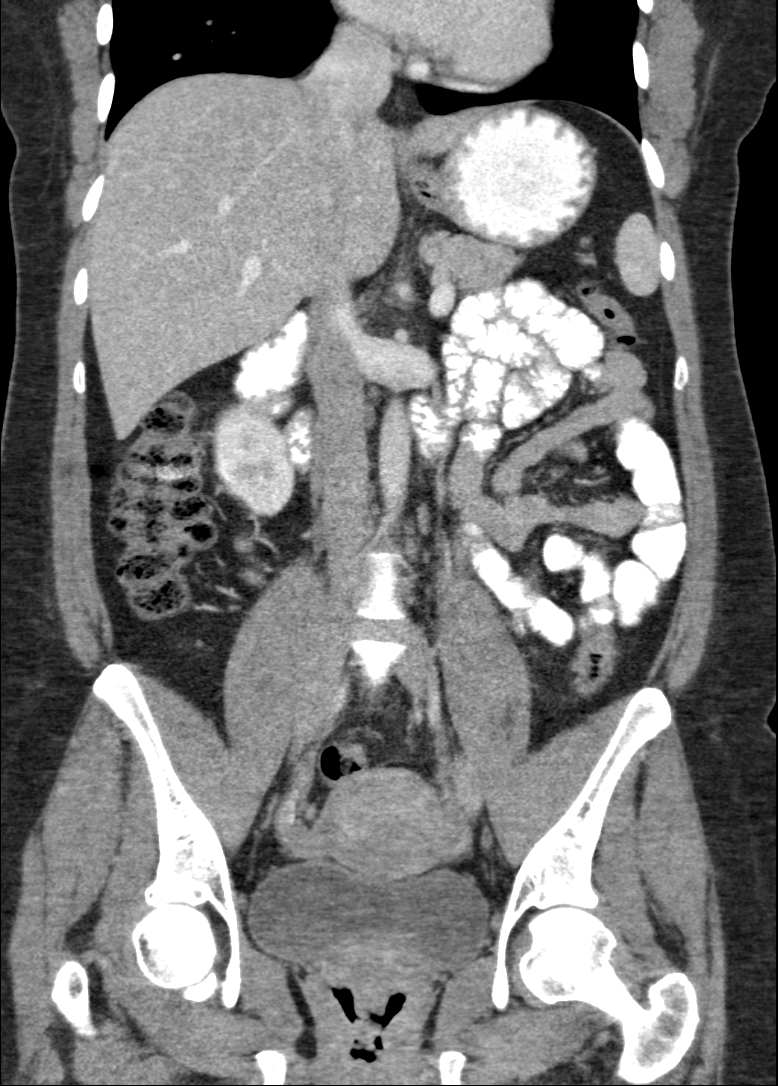

[11 of 46 positions shown; findings below may reference images not displayed]

FINDINGS: LUNG BASES: Included view of the lung bases demonstrates
heterogeneous lung attenuation which can be seen with small airway
disease, no pleural effusion or focal consolidation. Visualized
heart and pericardium are unremarkable.

SOLID ORGANS: The liver, spleen, pancreas and adrenal glands are
unremarkable. Multiple fat density gallstones without superimposed
inflammation.

GASTROINTESTINAL TRACT: The stomach, small and large bowel are
normal in course and caliber without inflammatory changes. Normal
appendix. Mild colonic diverticulosis. Metallic density and
ascending colon may reflect ingested material. Enteric contrast has
not yet reached the distal small bowel. Mild amount of retained
large bowel stool.

KIDNEYS/ URINARY TRACT: Kidneys are orthotopic, demonstrating
symmetric enhancement. No nephrolithiasis, hydronephrosis or solid
renal masses. The unopacified ureters are normal in course and
caliber. Urinary bladder is partially distended and unremarkable.

PERITONEUM/RETROPERITONEUM: No intraperitoneal free fluid nor free
air. Aortoiliac vessels are normal in course and caliber. No
lymphadenopathy by CT size criteria. Internal reproductive organs
are unremarkable.

SOFT TISSUE/OSSEOUS STRUCTURES: Nonsuspicious.
IMPRESSION: Cholelithiasis without CT findings of acute cholecystitis.

Mild colonic diverticulosis. Mild amount of retained large bowel
stool without bowel obstruction.

  By: Zamira Rath

## 2016-06-28 ENCOUNTER — Emergency Department (HOSPITAL_COMMUNITY)
Admission: EM | Admit: 2016-06-28 | Discharge: 2016-06-28 | Disposition: A | Discharge status: Home / Self Care | Payer: Medicaid Other | Attending: Emergency Medicine | Admitting: Emergency Medicine

## 2016-06-28 ENCOUNTER — Encounter (HOSPITAL_COMMUNITY): Payer: Self-pay

## 2016-06-28 DIAGNOSIS — F17210 Nicotine dependence, cigarettes, uncomplicated: Secondary | ICD-10-CM | POA: Insufficient documentation

## 2016-06-28 DIAGNOSIS — R1011 Right upper quadrant pain: Secondary | ICD-10-CM | POA: Diagnosis present

## 2016-06-28 DIAGNOSIS — I10 Essential (primary) hypertension: Secondary | ICD-10-CM | POA: Insufficient documentation

## 2016-06-28 DIAGNOSIS — M1990 Unspecified osteoarthritis, unspecified site: Secondary | ICD-10-CM | POA: Insufficient documentation

## 2016-06-28 DIAGNOSIS — R109 Unspecified abdominal pain: Secondary | ICD-10-CM

## 2016-06-28 DIAGNOSIS — F329 Major depressive disorder, single episode, unspecified: Secondary | ICD-10-CM | POA: Diagnosis not present

## 2016-06-28 DIAGNOSIS — Z7951 Long term (current) use of inhaled steroids: Secondary | ICD-10-CM | POA: Insufficient documentation

## 2016-06-28 DIAGNOSIS — G4489 Other headache syndrome: Secondary | ICD-10-CM | POA: Insufficient documentation

## 2016-06-28 DIAGNOSIS — J45909 Unspecified asthma, uncomplicated: Secondary | ICD-10-CM | POA: Insufficient documentation

## 2016-06-28 LAB — URINALYSIS, ROUTINE W REFLEX MICROSCOPIC
Bilirubin Urine: NEGATIVE
Glucose, UA: NEGATIVE mg/dL
Hgb urine dipstick: NEGATIVE
KETONES UR: NEGATIVE mg/dL
Leukocytes, UA: NEGATIVE
NITRITE: NEGATIVE
PH: 5.5 (ref 5.0–8.0)
PROTEIN: NEGATIVE mg/dL
Specific Gravity, Urine: 1.014 (ref 1.005–1.030)

## 2016-06-28 LAB — COMPREHENSIVE METABOLIC PANEL
ALT: 24 U/L (ref 14–54)
AST: 28 U/L (ref 15–41)
Albumin: 4.5 g/dL (ref 3.5–5.0)
Alkaline Phosphatase: 90 U/L (ref 38–126)
Anion gap: 8 (ref 5–15)
BILIRUBIN TOTAL: 0.7 mg/dL (ref 0.3–1.2)
BUN: 18 mg/dL (ref 6–20)
CO2: 26 mmol/L (ref 22–32)
Calcium: 9.8 mg/dL (ref 8.9–10.3)
Chloride: 105 mmol/L (ref 101–111)
Creatinine, Ser: 0.89 mg/dL (ref 0.44–1.00)
GFR calc Af Amer: >60 mL/min (ref >60)
GFR calc non Af Amer: >60 mL/min (ref >60)
GLUCOSE: 114 mg/dL — AB (ref 65–99)
Potassium: 4.3 mmol/L (ref 3.5–5.1)
Sodium: 139 mmol/L (ref 135–145)
TOTAL PROTEIN: 7.7 g/dL (ref 6.5–8.1)

## 2016-06-28 LAB — CBC WITH DIFFERENTIAL/PLATELET
BASOS PCT: 0 %
Basophils Absolute: 0 10*3/uL (ref 0.0–0.1)
EOS PCT: 1 %
Eosinophils Absolute: 0.1 10*3/uL (ref 0.0–0.7)
HEMATOCRIT: 36.9 % (ref 36.0–46.0)
Hemoglobin: 12.4 g/dL (ref 12.0–15.0)
Lymphocytes Relative: 34 %
Lymphs Abs: 3.1 10*3/uL (ref 0.7–4.0)
MCH: 29.4 pg (ref 26.0–34.0)
MCHC: 33.6 g/dL (ref 30.0–36.0)
MCV: 87.4 fL (ref 78.0–100.0)
MONO ABS: 0.8 10*3/uL (ref 0.1–1.0)
Monocytes Relative: 9 %
NEUTROS ABS: 5 10*3/uL (ref 1.7–7.7)
Neutrophils Relative %: 56 %
Platelets: 213 10*3/uL (ref 150–400)
RBC: 4.22 MIL/uL (ref 3.87–5.11)
RDW: 13 % (ref 11.5–15.5)
WBC: 9 10*3/uL (ref 4.0–10.5)

## 2016-06-28 LAB — LIPASE, BLOOD: LIPASE: 26 U/L (ref 11–51)

## 2016-06-28 LAB — POC URINE PREG, ED: PREG TEST UR: NEGATIVE

## 2016-06-28 MED ORDER — PROCHLORPERAZINE EDISYLATE 5 MG/ML IJ SOLN
10.0000 mg | Freq: Once | INTRAMUSCULAR | Status: AC
  Administered 2016-06-28: 10 mg via INTRAMUSCULAR
  Filled 2016-06-28: qty 2

## 2016-06-28 MED ORDER — DIPHENHYDRAMINE HCL 50 MG/ML IJ SOLN
25.0000 mg | Freq: Once | INTRAMUSCULAR | Status: AC
  Administered 2016-06-28: 25 mg via INTRAMUSCULAR
  Filled 2016-06-28: qty 1

## 2016-06-28 NOTE — Discharge Instructions (Signed)
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
## 2016-06-28 NOTE — ED Notes (Signed)
Upon my entering her room for d/c; I found that she had left.

## 2016-07-30 ENCOUNTER — Inpatient Hospital Stay (HOSPITAL_COMMUNITY)
Admission: AD | Admit: 2016-07-30 | Discharge: 2016-07-30 | Disposition: A | Discharge status: Home / Self Care | Payer: Medicaid Other | Source: Ambulatory Visit | Attending: Obstetrics and Gynecology | Admitting: Obstetrics and Gynecology

## 2016-07-30 ENCOUNTER — Encounter (HOSPITAL_COMMUNITY): Payer: Self-pay | Admitting: *Deleted

## 2016-07-30 DIAGNOSIS — Z9049 Acquired absence of other specified parts of digestive tract: Secondary | ICD-10-CM | POA: Insufficient documentation

## 2016-07-30 DIAGNOSIS — Z87891 Personal history of nicotine dependence: Secondary | ICD-10-CM | POA: Diagnosis not present

## 2016-07-30 DIAGNOSIS — N9089 Other specified noninflammatory disorders of vulva and perineum: Secondary | ICD-10-CM | POA: Diagnosis not present

## 2016-07-30 DIAGNOSIS — Z9851 Tubal ligation status: Secondary | ICD-10-CM | POA: Insufficient documentation

## 2016-07-30 DIAGNOSIS — Z79899 Other long term (current) drug therapy: Secondary | ICD-10-CM | POA: Diagnosis not present

## 2016-07-30 DIAGNOSIS — Z9889 Other specified postprocedural states: Secondary | ICD-10-CM | POA: Diagnosis not present

## 2016-07-30 DIAGNOSIS — N898 Other specified noninflammatory disorders of vagina: Secondary | ICD-10-CM | POA: Diagnosis not present

## 2016-07-30 HISTORY — DX: Unspecified abnormal cytological findings in specimens from vagina: R87.629

## 2016-07-30 LAB — URINE MICROSCOPIC-ADD ON

## 2016-07-30 LAB — WET PREP, GENITAL
CLUE CELLS WET PREP: NONE SEEN
Sperm: NONE SEEN
Trich, Wet Prep: NONE SEEN
Yeast Wet Prep HPF POC: NONE SEEN

## 2016-07-30 LAB — URINALYSIS, ROUTINE W REFLEX MICROSCOPIC
BILIRUBIN URINE: NEGATIVE
Glucose, UA: NEGATIVE mg/dL
HGB URINE DIPSTICK: NEGATIVE
Ketones, ur: NEGATIVE mg/dL
Nitrite: NEGATIVE
PROTEIN: NEGATIVE mg/dL
Specific Gravity, Urine: 1.01 (ref 1.005–1.030)
pH: 6 (ref 5.0–8.0)

## 2016-07-30 LAB — POCT PREGNANCY, URINE: PREG TEST UR: NEGATIVE

## 2016-07-30 NOTE — MAU Note (Signed)
Pt c/o feeling fever and burning with urination for 2-3 days now. Has an increase in frequency. Has itchy vaginal discharge. Denies odor. Has some anal itching also-denies hemmorhoids.

## 2016-07-30 NOTE — MAU Provider Note (Signed)
History     CSN: [id number]  Arrival date and time: 07/30/16 2017   First Provider Initiated Contact with Patient 07/30/16 2120      Chief Complaint  Patient presents with  . Vaginal Discharge   Vaginal Discharge  The patient's primary symptoms include genital itching, a genital odor and vaginal discharge. This is a new problem. Episode onset: 3 days ago. The problem occurs constantly. The problem has been unchanged. The patient is experiencing no pain. She is not pregnant. Associated symptoms include dysuria and nausea. Pertinent negatives include no abdominal pain, chills, constipation, diarrhea, fever, frequency, hematuria, urgency or vomiting. The vaginal discharge was watery. There has been no bleeding. Nothing aggravates the symptoms. Treatments tried: boric acid suppositories  The treatment provided no relief ("feels like it is worse". ). She uses tubal ligation for contraception.     Past Medical History:  Diagnosis Date  . Abnormal Pap smear   . Achalasia    esphog stretched several times  . Anxiety   . Arthritis   . Asthma    rarely uses inhaler  . Blood transfusion 01/2006   [place]  . Cardiomyopathy ([place])   . Constipation due to opioid therapy   . Depression   . Dysrhythmia   . Fibromyalgia   . GERD (gastroesophageal reflux disease)    no meds - diet controlled  . Heart murmur   . Hiatal hernia   . History of abnormal Pap smear   . History of chlamydia 1990's  . HSV-2 infection   . HTN (hypertension)   . Pneumonia 05/2013  . PVC's (premature ventricular contractions)   . Shortness of breath dyspnea   . Status post dilation of esophageal narrowing   . Vaginal Pap smear, abnormal     Past Surgical History:  Procedure Laterality Date  . ADENOIDECTOMY  12/2002  . BLADDER SUSPENSION  06/11/2012   Procedure: TRANSVAGINAL TAPE (TVT) PROCEDURE;  Surgeon: Delice Lesch, MD;  Location: [facility];  Service: Gynecology;  Laterality: N/A;  Tension Free Vaginal  Tape/cystoscopy  . BREAST SURGERY  07/2001   tumor removed right breast  . CHOLECYSTECTOMY N/A 01/23/2015   Procedure: LAPAROSCOPIC CHOLECYSTECTOMY ;  Surgeon: Fanny Skates, MD;  Location: [facility];  Service: General;  Laterality: N/A;  case booked with cholangiogram but MD did not complete intraoperative cholangiogram   . CYSTOSCOPY  06/11/2012   Procedure: CYSTOSCOPY;  Surgeon: Delice Lesch, MD;  Location: [facility];  Service: Gynecology;  Laterality: N/A;  . DILATION AND CURETTAGE OF UTERUS  1999  . DILATION AND CURETTAGE OF UTERUS  01/2006   retained products  . ESOPHAGOGASTRODUODENOSCOPY     with esophageal dilation x 3  . FOREIGN BODY REMOVAL  06/11/2012   Procedure: REMOVAL FOREIGN BODY EXTREMITY;  Surgeon: Delice Lesch, MD;  Location: [facility];  Service: Gynecology;  Laterality: Right;  Removal of Implanon   . LAPAROSCOPIC TUBAL LIGATION  06/11/2012   Procedure: LAPAROSCOPIC TUBAL LIGATION;  Surgeon: Delice Lesch, MD;  Location: [facility];  Service: Gynecology;  Laterality: Bilateral;  Laparoscopic Bilateral Fulgeration  . TONSILLECTOMY    . TUBAL LIGATION      Family History  Problem Relation Age of Onset  . Heart disease Father   . Hypertension Father   . Stroke Mother   . Hypertension Mother   . Heart attack Mother   . Breast cancer Mother   . Neuropathy Mother   . Arthritis Mother   . Asthma Mother   .  Neuropathy Sister   . Arthritis Sister   . Asthma Sister     Social History  Substance Use Topics  . Smoking status: Former Smoker    Packs/day: 0.40    Years: 15.00    Types: Cigarettes    Quit date: 04/14/2015  . Smokeless tobacco: Never Used     Comment: HAS CUT BACK  . Alcohol use No     Comment: on occasion    Allergies:  Allergies  Allergen Reactions  . Dust Mite Extract Shortness Of Breath  . Other Shortness Of Breath    Feathers, trees "almost everything outside"  . Pollen Extract Shortness Of Breath    Prescriptions Prior to Admission   Medication Sig Dispense Refill Last Dose  . carisoprodol (SOMA) 350 MG tablet Take 1 tablet by mouth 3 (three) times daily as needed.  0 Past Month at Unknown time  . clonazePAM (KLONOPIN) 1 MG tablet Take 1 mg by mouth at bedtime as needed. For anxiety   07/29/2016 at Unknown time  . losartan (COZAAR) 25 MG tablet Take 1 tablet (25 mg total) by mouth daily. 90 tablet 3 Past Week at Unknown time  . LYRICA 75 MG capsule Take 75 mg by mouth 2 (two) times daily.  0 Past Week at Unknown time  . oxyCODONE-acetaminophen (PERCOCET) 10-325 MG tablet Take 1-2 tablets by mouth every 4 (four) hours as needed for pain.   0 07/29/2016 at Unknown time  . albuterol (PROVENTIL HFA;VENTOLIN HFA) 108 (90 BASE) MCG/ACT inhaler Inhale 2 puffs into the lungs every 6 (six) hours as needed for wheezing or shortness of breath. 1 Inhaler 0 Rescue    Review of Systems  Constitutional: Negative for chills and fever.  Gastrointestinal: Positive for nausea. Negative for abdominal pain, constipation, diarrhea and vomiting.  Genitourinary: Positive for dysuria and vaginal discharge. Negative for frequency, hematuria and urgency.   Physical Exam   Blood pressure 131/85, pulse 89, temperature 98.4 F (36.9 C), temperature source Oral, resp. rate 18, height 5\' 9"  (1.753 m), weight 224 lb (101.6 kg), last menstrual period 06/25/2016, SpO2 100 %.  Physical Exam  Nursing note and vitals reviewed. Constitutional: She is oriented to person, place, and time. She appears well-developed and well-nourished. No distress.  HENT:  Head: Normocephalic.  Cardiovascular: Normal rate.   Respiratory: Effort normal.  GI: Soft. There is no tenderness. There is no rebound.  Genitourinary:  Genitourinary Comments:  External: no lesion Vagina: small amount of white discharge Cervix: pink, smooth, no CMT Uterus: NSSC Adnexa: NT   Neurological: She is alert and oriented to person, place, and time.  Skin: Skin is warm and dry.   Psychiatric: She has a normal mood and affect.   Results for orders placed or performed during the hospital encounter of 07/30/16 (from the past 24 hour(s))  Urinalysis, Routine w reflex microscopic (not at [facility])     Status: Abnormal   Collection Time: 07/30/16  8:47 PM  Result Value Ref Range   Color, Urine YELLOW YELLOW   APPearance CLEAR CLEAR   Specific Gravity, Urine 1.010 1.005 - 1.030   pH 6.0 5.0 - 8.0   Glucose, UA NEGATIVE NEGATIVE mg/dL   Hgb urine dipstick NEGATIVE NEGATIVE   Bilirubin Urine NEGATIVE NEGATIVE   Ketones, ur NEGATIVE NEGATIVE mg/dL   Protein, ur NEGATIVE NEGATIVE mg/dL   Nitrite NEGATIVE NEGATIVE   Leukocytes, UA TRACE (A) NEGATIVE  Urine microscopic-add on     Status: Abnormal   Collection Time:  07/30/16  8:47 PM  Result Value Ref Range   Squamous Epithelial / LPF 0-5 (A) NONE SEEN   WBC, UA 0-5 0 - 5 WBC/hpf   RBC / HPF 0-5 0 - 5 RBC/hpf   Bacteria, UA FEW (A) NONE SEEN  Pregnancy, urine POC     Status: None   Collection Time: 07/30/16  8:59 PM  Result Value Ref Range   Preg Test, Ur NEGATIVE NEGATIVE  Wet prep, genital     Status: Abnormal   Collection Time: 07/30/16  9:02 PM  Result Value Ref Range   Yeast Wet Prep HPF POC NONE SEEN NONE SEEN   Trich, Wet Prep NONE SEEN NONE SEEN   Clue Cells Wet Prep HPF POC NONE SEEN NONE SEEN   WBC, Wet Prep HPF POC MODERATE (A) NONE SEEN   Sperm NONE SEEN     MAU Course  Procedures  MDM   Assessment and Plan   1. Vulvar irritation    DC home Comfort measures reviewed  RX: none UC pending, someone will call if abx needed.  Return to MAU as needed   Follow-up Information    [facility] .   Contact information: [place] [address] [phone]            Mathis Bud 07/30/2016, 9:22 PM

## 2016-07-30 NOTE — MAU Note (Signed)
Pt states she has had the same partner for the last 4 years but recently broke up.  Slept with exboyfriend on Sat.  3-4 days later stated to have itching and burning in vaginal and rectal area.  Pt also states she has burning with urination and urine has strong odor.

## 2016-07-31 LAB — HIV ANTIBODY (ROUTINE TESTING W REFLEX): HIV SCREEN 4TH GENERATION: NONREACTIVE

## 2016-07-31 LAB — GC/CHLAMYDIA PROBE AMP (~~LOC~~) NOT AT ARMC
CHLAMYDIA, DNA PROBE: NEGATIVE
NEISSERIA GONORRHEA: NEGATIVE

## 2016-07-31 LAB — RPR: RPR Ser Ql: NONREACTIVE

## 2016-08-02 LAB — URINE CULTURE

## 2016-08-03 ENCOUNTER — Telehealth: Payer: Self-pay | Admitting: Certified Nurse Midwife

## 2016-08-03 DIAGNOSIS — B3749 Other urogenital candidiasis: Secondary | ICD-10-CM

## 2016-08-03 MED ORDER — SULFAMETHOXAZOLE-TRIMETHOPRIM 800-160 MG PO TABS: 1.0000 | ORAL_TABLET | Freq: Two times a day (BID) | ORAL | 0 refills | Status: DC

## 2016-08-03 NOTE — Telephone Encounter (Signed)
Discussed urine culture results (positive for ecoli) with pt. Pt continues to have dysuria. Recommend Bactrim DS x3 days-Rx called int to pharmacy on file. Increase water intake. Improve perineal hygiene. Return for worsening sx. Pt verbalizes understanding.

## 2016-10-26 ENCOUNTER — Emergency Department (HOSPITAL_COMMUNITY)
Admission: EM | Admit: 2016-10-26 | Discharge: 2016-10-27 | Disposition: A | Discharge status: Home / Self Care | Payer: Medicaid Other | Attending: Emergency Medicine | Admitting: Emergency Medicine

## 2016-10-26 ENCOUNTER — Encounter (HOSPITAL_COMMUNITY): Payer: Self-pay | Admitting: Emergency Medicine

## 2016-10-26 DIAGNOSIS — Z87891 Personal history of nicotine dependence: Secondary | ICD-10-CM | POA: Insufficient documentation

## 2016-10-26 DIAGNOSIS — Z79899 Other long term (current) drug therapy: Secondary | ICD-10-CM | POA: Insufficient documentation

## 2016-10-26 DIAGNOSIS — R1011 Right upper quadrant pain: Secondary | ICD-10-CM

## 2016-10-26 DIAGNOSIS — J45909 Unspecified asthma, uncomplicated: Secondary | ICD-10-CM | POA: Diagnosis not present

## 2016-10-26 DIAGNOSIS — I10 Essential (primary) hypertension: Secondary | ICD-10-CM | POA: Insufficient documentation

## 2016-10-26 DIAGNOSIS — K5900 Constipation, unspecified: Secondary | ICD-10-CM | POA: Diagnosis not present

## 2016-10-26 LAB — I-STAT BETA HCG BLOOD, ED (MC, WL, AP ONLY): I-stat hCG, quantitative: <5 m[IU]/mL (ref <5)

## 2016-10-26 NOTE — ED Triage Notes (Signed)
Pt states that she has had RUQ abdominal swelling/pain x 1 month. Hx of cholecystectomy. Alert and oriented.

## 2016-10-27 ENCOUNTER — Emergency Department (HOSPITAL_COMMUNITY): Payer: Medicaid Other

## 2016-10-27 LAB — COMPREHENSIVE METABOLIC PANEL
ALT: 23 U/L (ref 14–54)
AST: 26 U/L (ref 15–41)
Albumin: 4.5 g/dL (ref 3.5–5.0)
Alkaline Phosphatase: 67 U/L (ref 38–126)
Anion gap: 9 (ref 5–15)
BILIRUBIN TOTAL: 0.5 mg/dL (ref 0.3–1.2)
BUN: 18 mg/dL (ref 6–20)
CO2: 24 mmol/L (ref 22–32)
CREATININE: 1.19 mg/dL — AB (ref 0.44–1.00)
Calcium: 9.3 mg/dL (ref 8.9–10.3)
Chloride: 104 mmol/L (ref 101–111)
GFR calc Af Amer: >60 mL/min (ref >60)
GFR, EST NON AFRICAN AMERICAN: 58 mL/min — AB (ref >60)
GLUCOSE: 103 mg/dL — AB (ref 65–99)
Potassium: 4.1 mmol/L (ref 3.5–5.1)
Sodium: 137 mmol/L (ref 135–145)
TOTAL PROTEIN: 7.6 g/dL (ref 6.5–8.1)

## 2016-10-27 LAB — URINE MICROSCOPIC-ADD ON

## 2016-10-27 LAB — CBC
HCT: 39.8 % (ref 36.0–46.0)
Hemoglobin: 13.4 g/dL (ref 12.0–15.0)
MCH: 29.8 pg (ref 26.0–34.0)
MCHC: 33.7 g/dL (ref 30.0–36.0)
MCV: 88.4 fL (ref 78.0–100.0)
PLATELETS: 219 10*3/uL (ref 150–400)
RBC: 4.5 MIL/uL (ref 3.87–5.11)
RDW: 12.9 % (ref 11.5–15.5)
WBC: 11 10*3/uL — AB (ref 4.0–10.5)

## 2016-10-27 LAB — URINALYSIS, ROUTINE W REFLEX MICROSCOPIC
BILIRUBIN URINE: NEGATIVE
GLUCOSE, UA: NEGATIVE mg/dL
KETONES UR: NEGATIVE mg/dL
NITRITE: NEGATIVE
PROTEIN: NEGATIVE mg/dL
Specific Gravity, Urine: 1.016 (ref 1.005–1.030)
pH: 6 (ref 5.0–8.0)

## 2016-10-27 LAB — LIPASE, BLOOD: Lipase: 29 U/L (ref 11–51)

## 2016-10-27 MED ORDER — PANTOPRAZOLE SODIUM 20 MG PO TBEC: 20.0000 mg | DELAYED_RELEASE_TABLET | Freq: Every day | ORAL | 3 refills | Status: DC

## 2016-10-27 MED ORDER — DICYCLOMINE HCL 20 MG PO TABS: 20.0000 mg | ORAL_TABLET | Freq: Two times a day (BID) | ORAL | 0 refills | Status: DC

## 2016-10-27 MED ORDER — SUCRALFATE 1 GM/10ML PO SUSP: 1.0000 g | Freq: Three times a day (TID) | ORAL | 0 refills | Status: DC

## 2016-10-27 MED ORDER — POLYETHYLENE GLYCOL 3350 17 G PO PACK: 17.0000 g | PACK | Freq: Every day | ORAL | 0 refills | Status: DC

## 2016-10-27 NOTE — ED Provider Notes (Signed)
[facility] DEPT Provider Note   CSN: [id number] Arrival date & time: 10/26/16  2314  By signing my name below, I, Higinio Plan, attest that this documentation has been prepared under the direction and in the presence of Orpah Greek, MD . Electronically Signed: Higinio Plan, Scribe. 10/27/2016. 12:09 AM.  History   Chief Complaint Chief Complaint  Patient presents with  . Abdominal Pain   The history is provided by the patient. No language interpreter was used.   HPI Comments: Raven [name] is a 35 y.o. female with PMHx of HTN,who presents to the Emergency Department complaining of gradually worsening, intermittent, RUQ abdominal pain that began ~1 month ago. Pt reports her pain begins as sudden onset, abdominal cramping with associated abdominal swelling; she notes there is nothing in particular that causes her pain. She states associated headache, dizziness, chills and loose stools within the past few days. She reports she has taken ibuprofen with no relief. Pt states hx of cholecystectomy and notes her current pain feels similar. She also reports hx of ulcers and GERD. Pt notes she is followed by [facility] but has not seem them "in a while." She denies nausea, vomiting and diarrhea.   Past Medical History:  Diagnosis Date  . Abnormal Pap smear   . Achalasia    esphog stretched several times  . Anxiety   . Arthritis   . Asthma    rarely uses inhaler  . Blood transfusion 01/2006   [place]  . Cardiomyopathy ([place])   . Constipation due to opioid therapy   . Depression   . Dysrhythmia   . Fibromyalgia   . GERD (gastroesophageal reflux disease)    no meds - diet controlled  . Heart murmur   . Hiatal hernia   . History of abnormal Pap smear   . History of chlamydia 1990's  . HSV-2 infection   . HTN (hypertension)   . Pneumonia 05/2013  . PVC's (premature ventricular contractions)   . Shortness of breath dyspnea   . Status post dilation of esophageal narrowing   .  Vaginal Pap smear, abnormal     Patient Active Problem List   Diagnosis Date Noted  . Gallstones and inflammation of gallbladder without obstruction 01/23/2015  . S/P laparoscopic surgery 01/23/2015  . Upper airway cough syndrome 09/05/2014  . Smoker 08/09/2014  . Dyspnea 08/08/2014  . Abdominal pain, left lower quadrant 03/27/2014  . Cardiomyopathy, idiopathic ([place]) 01/09/2014  . History of chlamydia 01/09/2013  . 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  . History of abnormal Pap smear 01/09/2013  . Achalasia   . Abnormal Pap smear   . Depression   . HSV-2 infection   . Heart palpitations   . Heart murmur   . Anxiety   . GERD (gastroesophageal reflux disease)   . 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 PVC's (premature ventricular contractions) 05/19/2011  . Palpitation 04/11/2011  . ESOPHAGEAL STRICTURE 08/10/2009  . DYSPHAGIA 08/10/2009  . Esophageal reflux 11/30/2008  . Unspecified constipation 11/30/2008  . DEPRESSION 10/09/2008  . ASTHMA 10/09/2008    Past Surgical History:  Procedure Laterality Date  . ADENOIDECTOMY  12/2002  . BLADDER SUSPENSION  06/11/2012   Procedure: TRANSVAGINAL TAPE (TVT) PROCEDURE;  Surgeon: Delice Lesch, MD;  Location: [facility];  Service: Gynecology;  Laterality: N/A;  Tension Free Vaginal Tape/cystoscopy  . BREAST SURGERY  07/2001   tumor removed right breast  . CHOLECYSTECTOMY N/A 01/23/2015   Procedure: LAPAROSCOPIC CHOLECYSTECTOMY ;  Surgeon: Fanny Skates, MD;  Location: [facility];  Service: General;  Laterality: N/A;  case booked with cholangiogram but MD did not complete intraoperative cholangiogram   . CYSTOSCOPY  06/11/2012   Procedure: CYSTOSCOPY;  Surgeon: Delice Lesch, MD;  Location: [facility];  Service: Gynecology;  Laterality: N/A;  . DILATION AND CURETTAGE OF UTERUS  1999  . DILATION AND CURETTAGE OF UTERUS  01/2006   retained products  . ESOPHAGOGASTRODUODENOSCOPY     with esophageal dilation x 3  . FOREIGN BODY REMOVAL   06/11/2012   Procedure: REMOVAL FOREIGN BODY EXTREMITY;  Surgeon: Delice Lesch, MD;  Location: [facility];  Service: Gynecology;  Laterality: Right;  Removal of Implanon   . LAPAROSCOPIC TUBAL LIGATION  06/11/2012   Procedure: LAPAROSCOPIC TUBAL LIGATION;  Surgeon: Delice Lesch, MD;  Location: [facility];  Service: Gynecology;  Laterality: Bilateral;  Laparoscopic Bilateral Fulgeration  . TONSILLECTOMY    . TUBAL LIGATION      OB History    Gravida Para Term Preterm AB Living   4 2 2  0 2 2   SAB TAB Ectopic Multiple Live Births   2             Home Medications    Prior to Admission medications   Medication Sig Start Date End Date Taking? Authorizing Provider  albuterol (PROVENTIL HFA;VENTOLIN HFA) 108 (90 BASE) MCG/ACT inhaler Inhale 2 puffs into the lungs every 6 (six) hours as needed for wheezing or shortness of breath. 11/10/14   Rolland Porter, MD  carisoprodol (SOMA) 350 MG tablet Take 1 tablet by mouth 3 (three) times daily as needed. 05/09/16   Historical Provider, MD  clonazePAM (KLONOPIN) 1 MG tablet Take 1 mg by mouth at bedtime as needed. For anxiety    Historical Provider, MD  dicyclomine (BENTYL) 20 MG tablet Take 1 tablet (20 mg total) by mouth 2 (two) times daily. 10/27/16   Orpah Greek, MD  losartan (COZAAR) 25 MG tablet Take 1 tablet (25 mg total) by mouth daily. 01/11/15   Deboraha Sprang, MD  LYRICA 75 MG capsule Take 75 mg by mouth 2 (two) times daily. 06/26/16   Historical Provider, MD  oxyCODONE-acetaminophen (PERCOCET) 10-325 MG tablet Take 1-2 tablets by mouth every 4 (four) hours as needed for pain.  06/09/16   Historical Provider, MD  pantoprazole (PROTONIX) 20 MG tablet Take 1 tablet (20 mg total) by mouth daily. 10/27/16   Orpah Greek, MD  polyethylene glycol (MIRALAX / GLYCOLAX) packet Take 17 g by mouth daily. 10/27/16   Orpah Greek, MD  sucralfate (CARAFATE) 1 GM/10ML suspension Take 10 mLs (1 g total) by mouth 4 (four) times daily -  with  meals and at bedtime. 10/27/16   Orpah Greek, MD  sulfamethoxazole-trimethoprim (BACTRIM DS,SEPTRA DS) 800-160 MG tablet Take 1 tablet by mouth 2 (two) times daily. 08/03/16   Julianne Handler, CNM    Family History Family History  Problem Relation Age of Onset  . Heart disease Father   . Hypertension Father   . Stroke Mother   . Hypertension Mother   . Heart attack Mother   . Breast cancer Mother   . Neuropathy Mother   . Arthritis Mother   . Asthma Mother   . Neuropathy Sister   . Arthritis Sister   . Asthma Sister     Social History Social History  Substance Use Topics  . Smoking status: Former Smoker    Packs/day: 0.40  Years: 15.00    Types: Cigarettes    Quit date: 04/14/2015  . Smokeless tobacco: Never Used     Comment: HAS CUT BACK  . Alcohol use No     Comment: on occasion     Allergies   Dust mite extract; Other; and Pollen extract   Review of Systems Review of Systems  Constitutional: Positive for chills.  Gastrointestinal: Positive for abdominal pain. Negative for nausea and vomiting.  Neurological: Positive for dizziness and headaches.   Physical Exam Updated Vital Signs BP 131/75 (BP Location: Right Arm)   Pulse 89   Temp 97.7 F (36.5 C) (Oral)   Resp 18   LMP 10/06/2016   SpO2 100%   Physical Exam  Constitutional: She is oriented to person, place, and time. She appears well-developed and well-nourished. No distress.  HENT:  Head: Normocephalic and atraumatic.  Right Ear: Hearing normal.  Left Ear: Hearing normal.  Nose: Nose normal.  Mouth/Throat: Oropharynx is clear and moist and mucous membranes are normal.  Eyes: Conjunctivae and EOM are normal. Pupils are equal, round, and reactive to light.  Neck: Normal range of motion. Neck supple.  Cardiovascular: Regular rhythm, S1 normal and S2 normal.  Exam reveals no gallop and no friction rub.   No murmur heard. Pulmonary/Chest: Effort normal and breath sounds normal. No  respiratory distress. She exhibits no tenderness.  Abdominal: Soft. Normal appearance and bowel sounds are normal. There is no hepatosplenomegaly. There is tenderness. There is no rebound, no guarding, no tenderness at McBurney's point and negative Murphy's sign. No hernia.  Mild RUQ tenderness without guarding or rebound  Musculoskeletal: Normal range of motion.  Neurological: She is alert and oriented to person, place, and time. She has normal strength. No cranial nerve deficit or sensory deficit. Coordination normal. GCS eye subscore is 4. GCS verbal subscore is 5. GCS motor subscore is 6.  Skin: Skin is warm, dry and intact. No rash noted. No cyanosis.  Psychiatric: She has a normal mood and affect. Her speech is normal and behavior is normal. Thought content normal.  Nursing note and vitals reviewed.  ED Treatments / Results  Labs (all labs ordered are listed, but only abnormal results are displayed) Labs Reviewed  COMPREHENSIVE METABOLIC PANEL - Abnormal; Notable for the following:       Result Value   Glucose, Bld 103 (*)    Creatinine, Ser 1.19 (*)    GFR calc non Af Amer 58 (*)    All other components within normal limits  CBC - Abnormal; Notable for the following:    WBC 11.0 (*)    All other components within normal limits  URINALYSIS, ROUTINE W REFLEX MICROSCOPIC (NOT AT [facility]) - Abnormal; Notable for the following:    APPearance CLOUDY (*)    Hgb urine dipstick TRACE (*)    Leukocytes, UA SMALL (*)    All other components within normal limits  URINE MICROSCOPIC-ADD ON - Abnormal; Notable for the following:    Squamous Epithelial / LPF 0-5 (*)    Bacteria, UA RARE (*)    All other components within normal limits  LIPASE, BLOOD  I-STAT BETA HCG BLOOD, ED ([facility], [facility], [facility] ONLY)    EKG  EKG Interpretation None       Radiology Dg Abd Acute W/chest  Result Date: 10/27/2016 CLINICAL DATA:  Chronic right upper quadrant abdominal swelling and pain. Initial encounter. EXAM:  DG ABDOMEN ACUTE W/ 1V CHEST COMPARISON:  CT of the abdomen and  pelvis performed 05/10/2015, and chest radiograph performed 04/27/2015 FINDINGS: The lungs are well-aerated and clear. There is no evidence of focal opacification, pleural effusion or pneumothorax. The cardiomediastinal silhouette is within normal limits. The visualized bowel gas pattern is unremarkable. Scattered stool and air are seen within the colon; there is no evidence of small bowel dilatation to suggest obstruction. No free intra-abdominal air is identified on the provided upright view. No acute osseous abnormalities are seen; the sacroiliac joints are unremarkable in appearance. Clips are noted within the right upper quadrant, reflecting prior cholecystectomy. IMPRESSION: 1. Unremarkable bowel gas pattern; no free intra-abdominal air seen. Small to moderate amount of stool noted in the colon. 2. No acute cardiopulmonary process seen. Electronically Signed   By: Garald Balding M.D.   On: 10/27/2016 00:34    Procedures Procedures (including critical care time)  Medications Ordered in ED Medications - No data to display  DIAGNOSTIC STUDIES:  Oxygen Saturation is 100% on RA, normal by my interpretation.    COORDINATION OF CARE:  12:06 AM Discussed treatment plan with pt at bedside and pt agreed to plan.  Initial Impression / Assessment and Plan / ED Course  I have reviewed the triage vital signs and the nursing notes.  Pertinent labs & imaging results that were available during my care of the patient were reviewed by me and considered in my medical decision making (see chart for details).  Clinical Course   Patient presents to the emergency department for evaluation of abdominal pain. Patient experiencing some chronic upper abdominal pain for more than a month. She does have a history of GERD. She had cholecystectomy last year, no concern for gallbladder etiology. Examination revealed mild epigastric and right upper quadrant  tenderness, no acute surgical process. Lab work was unremarkable. X-ray shows stool burden, no other abnormality. Likely some element of constipation as well as possible reflux, peptic ulcer disease. Patient will be treated as such, follow-up with gastroenterology.  I personally performed the services described in this documentation, which was scribed in my presence. The recorded information has been reviewed and is accurate.   Final Clinical Impressions(s) / ED Diagnoses   Final diagnoses:  Constipation, unspecified constipation type  Right upper quadrant abdominal pain    New Prescriptions New Prescriptions   DICYCLOMINE (BENTYL) 20 MG TABLET    Take 1 tablet (20 mg total) by mouth 2 (two) times daily.   PANTOPRAZOLE (PROTONIX) 20 MG TABLET    Take 1 tablet (20 mg total) by mouth daily.   POLYETHYLENE GLYCOL (MIRALAX / GLYCOLAX) PACKET    Take 17 g by mouth daily.   SUCRALFATE (CARAFATE) 1 GM/10ML SUSPENSION    Take 10 mLs (1 g total) by mouth 4 (four) times daily -  with meals and at bedtime.     Orpah Greek, MD 10/27/16 [phone]

## 2017-02-09 ENCOUNTER — Encounter (HOSPITAL_COMMUNITY): Payer: Self-pay | Admitting: *Deleted

## 2017-02-09 ENCOUNTER — Inpatient Hospital Stay (HOSPITAL_COMMUNITY)
Admission: AD | Admit: 2017-02-09 | Discharge: 2017-02-09 | Disposition: A | Discharge status: Home / Self Care | Payer: Medicaid Other | Source: Ambulatory Visit | Attending: Obstetrics and Gynecology | Admitting: Obstetrics and Gynecology

## 2017-02-09 DIAGNOSIS — B9689 Other specified bacterial agents as the cause of diseases classified elsewhere: Secondary | ICD-10-CM | POA: Insufficient documentation

## 2017-02-09 DIAGNOSIS — F17210 Nicotine dependence, cigarettes, uncomplicated: Secondary | ICD-10-CM | POA: Diagnosis not present

## 2017-02-09 DIAGNOSIS — L293 Anogenital pruritus, unspecified: Secondary | ICD-10-CM | POA: Diagnosis present

## 2017-02-09 DIAGNOSIS — N760 Acute vaginitis: Secondary | ICD-10-CM | POA: Insufficient documentation

## 2017-02-09 LAB — URINALYSIS, ROUTINE W REFLEX MICROSCOPIC
Bilirubin Urine: NEGATIVE
GLUCOSE, UA: NEGATIVE mg/dL
HGB URINE DIPSTICK: NEGATIVE
Ketones, ur: NEGATIVE mg/dL
NITRITE: NEGATIVE
Protein, ur: NEGATIVE mg/dL
SPECIFIC GRAVITY, URINE: 1.009 (ref 1.005–1.030)
pH: 8 (ref 5.0–8.0)

## 2017-02-09 LAB — WET PREP, GENITAL
Sperm: NONE SEEN
TRICH WET PREP: NONE SEEN
YEAST WET PREP: NONE SEEN

## 2017-02-09 LAB — POCT PREGNANCY, URINE: PREG TEST UR: NEGATIVE

## 2017-02-09 MED ORDER — AZITHROMYCIN 250 MG PO TABS
1000.0000 mg | ORAL_TABLET | Freq: Once | ORAL | Status: AC
  Administered 2017-02-09: 1000 mg via ORAL
  Filled 2017-02-09: qty 4

## 2017-02-09 MED ORDER — METRONIDAZOLE 500 MG PO TABS: 500.0000 mg | ORAL_TABLET | Freq: Three times a day (TID) | ORAL | 0 refills | Status: AC

## 2017-02-09 NOTE — MAU Provider Note (Signed)
History   Patient Raven [name] is a 36 year old [id number] non-pregnant female here with complaints of foul-smelling urine, watery discharge, vaginal itching and burning when she urinates. She had unprotected intercourse one week ago with a new partner and she is very concerned about STIs. She said she thought she had bacterial vaginosis and started taking her flagyl but it hasn't worked, and usually flagyl has worked for these sympoms.   CSN: [id number]  Arrival date and time: 02/09/17 0845   None     Chief Complaint  Patient presents with  . vaginal discomfort  . Dysuria   Dysuria   This is a new problem. The current episode started in the past 7 days. The problem occurs every urination. The problem has been unchanged. The quality of the pain is described as burning. The pain is moderate. There has been no fever. She is sexually active. Associated symptoms include a discharge. Pertinent negatives include no chills, flank pain, frequency, hematuria, hesitancy, nausea, possible pregnancy, sweats, urgency or vomiting. Treatments tried: metro gel.  Vaginal Discharge  The patient's primary symptoms include genital itching, pelvic pain and vaginal discharge. The patient's pertinent negatives include no genital lesions, genital odor, genital rash, missed menses or vaginal bleeding. This is a new problem. The current episode started in the past 7 days. The problem occurs 2 to 4 times per day. Associated symptoms include dysuria. Pertinent negatives include no chills, flank pain, frequency, hematuria, nausea, urgency or vomiting. The vaginal discharge was watery. There has been no bleeding.   She had unprotected intercourse one week ago with a new partner and she is very concerned about STIs. She said she thought she had bacterial vaginosis and started taking her metrogel but it hasn't worked, and usually Environmental education officer works for these sympoms. Her symptoms remind her of when she had chlamydia several years  ago, and she says she was once diagnosed with PID although she has not had any PID work-ups lately.    OB History    Gravida Para Term Preterm AB Living   4 2 2  0 2 2   SAB TAB Ectopic Multiple Live Births   2              Past Medical History:  Diagnosis Date  . Abnormal Pap smear   . Achalasia    esphog stretched several times  . Anxiety   . Arthritis   . Asthma    rarely uses inhaler  . Blood transfusion 01/2006   [place]  . Cardiomyopathy ([place])   . Constipation due to opioid therapy   . Depression   . Dysrhythmia   . Fibromyalgia   . GERD (gastroesophageal reflux disease)    no meds - diet controlled  . Heart murmur   . Hiatal hernia   . History of abnormal Pap smear   . History of chlamydia 1990's  . HSV-2 infection   . HTN (hypertension)   . Pneumonia 05/2013  . PVC's (premature ventricular contractions)   . Shortness of breath dyspnea   . Status post dilation of esophageal narrowing   . Vaginal Pap smear, abnormal     Past Surgical History:  Procedure Laterality Date  . ADENOIDECTOMY  12/2002  . BLADDER SUSPENSION  06/11/2012   Procedure: TRANSVAGINAL TAPE (TVT) PROCEDURE;  Surgeon: Delice Lesch, MD;  Location: [facility];  Service: Gynecology;  Laterality: N/A;  Tension Free Vaginal Tape/cystoscopy  . BREAST SURGERY  07/2001   tumor removed right breast  .  CHOLECYSTECTOMY N/A 01/23/2015   Procedure: LAPAROSCOPIC CHOLECYSTECTOMY ;  Surgeon: Fanny Skates, MD;  Location: [facility];  Service: General;  Laterality: N/A;  case booked with cholangiogram but MD did not complete intraoperative cholangiogram   . CYSTOSCOPY  06/11/2012   Procedure: CYSTOSCOPY;  Surgeon: Delice Lesch, MD;  Location: [facility];  Service: Gynecology;  Laterality: N/A;  . DILATION AND CURETTAGE OF UTERUS  1999  . DILATION AND CURETTAGE OF UTERUS  01/2006   retained products  . ESOPHAGOGASTRODUODENOSCOPY     with esophageal dilation x 3  . FOREIGN BODY REMOVAL  06/11/2012   Procedure: REMOVAL  FOREIGN BODY EXTREMITY;  Surgeon: Delice Lesch, MD;  Location: [facility];  Service: Gynecology;  Laterality: Right;  Removal of Implanon   . LAPAROSCOPIC TUBAL LIGATION  06/11/2012   Procedure: LAPAROSCOPIC TUBAL LIGATION;  Surgeon: Delice Lesch, MD;  Location: [facility];  Service: Gynecology;  Laterality: Bilateral;  Laparoscopic Bilateral Fulgeration  . TONSILLECTOMY    . TUBAL LIGATION      Family History  Problem Relation Age of Onset  . Heart disease Father   . Hypertension Father   . Stroke Mother   . Hypertension Mother   . Heart attack Mother   . Breast cancer Mother   . Neuropathy Mother   . Arthritis Mother   . Asthma Mother   . Neuropathy Sister   . Arthritis Sister   . Asthma Sister     Social History  Substance Use Topics  . Smoking status: Current Some Day Smoker    Packs/day: 0.25    Years: 15.00    Types: Cigarettes    Last attempt to quit: 04/14/2015  . Smokeless tobacco: Never Used     Comment: HAS CUT BACK  . Alcohol use No     Comment: on occasion    Allergies:  Allergies  Allergen Reactions  . Dust Mite Extract Shortness Of Breath  . Other Shortness Of Breath    Feathers, trees "almost everything outside"  . Pollen Extract Shortness Of Breath    Prescriptions Prior to Admission  Medication Sig Dispense Refill Last Dose  . oxyCODONE-acetaminophen (PERCOCET) 10-325 MG tablet Take 1-2 tablets by mouth every 4 (four) hours as needed for pain.   0 02/08/2017 at Unknown time  . albuterol (PROVENTIL HFA;VENTOLIN HFA) 108 (90 BASE) MCG/ACT inhaler Inhale 2 puffs into the lungs every 6 (six) hours as needed for wheezing or shortness of breath. 1 Inhaler 0 emergency  . carisoprodol (SOMA) 350 MG tablet Take 1 tablet by mouth 3 (three) times daily as needed.  0 02/07/2017  . clonazePAM (KLONOPIN) 1 MG tablet Take 1 mg by mouth at bedtime as needed. For anxiety   02/07/2017  . dicyclomine (BENTYL) 20 MG tablet Take 1 tablet (20 mg total) by mouth 2 (two) times  daily. (Patient not taking: Reported on 02/09/2017) 20 tablet 0 Not Taking at Unknown time  . LYRICA 75 MG capsule Take 75 mg by mouth 2 (two) times daily.  0 02/07/2017  . pantoprazole (PROTONIX) 20 MG tablet Take 1 tablet (20 mg total) by mouth daily. (Patient not taking: Reported on 02/09/2017) 30 tablet 3 Not Taking at Unknown time  . polyethylene glycol (MIRALAX / GLYCOLAX) packet Take 17 g by mouth daily. (Patient not taking: Reported on 02/09/2017) 14 each 0 Not Taking at Unknown time  . sucralfate (CARAFATE) 1 GM/10ML suspension Take 10 mLs (1 g total) by mouth 4 (four) times daily -  with meals and at bedtime. (Patient not taking: Reported on 02/09/2017) 420 mL 0 Not Taking at Unknown time    Review of Systems  Constitutional: Negative for chills.  HENT: Negative.   Eyes: Negative.   Respiratory: Negative.   Cardiovascular: Negative.   Gastrointestinal: Negative for nausea and vomiting.  Endocrine: Negative.   Genitourinary: Positive for dysuria, pelvic pain and vaginal discharge. Negative for flank pain, frequency, hematuria, hesitancy, missed menses and urgency.  Musculoskeletal: Negative.   Allergic/Immunologic: Negative.   Neurological: Negative.   Hematological: Negative.   Psychiatric/Behavioral: Negative.    Physical Exam   Blood pressure 150/96, pulse 86, temperature 98 F (36.7 C), temperature source Oral, resp. rate 20, weight 215 lb 4 oz (97.6 kg), last menstrual period 01/26/2017, SpO2 98 %.  Physical Exam  Constitutional: She is oriented to person, place, and time. She appears well-developed and well-nourished.  HENT:  Head: Normocephalic.  Neck: Normal range of motion.  Respiratory: Effort normal. No respiratory distress. She has no wheezes. She has no rales. She exhibits no tenderness.  GI: Soft. Bowel sounds are normal. She exhibits no distension and no mass. There is no tenderness. There is no rebound and no guarding.  Genitourinary:  Genitourinary Comments:  NEFG, vaginal walls pink with no lesions. Thick white discharge (non-adhereant in the vaginal vault, no odor). No CMT, no adnexal tenderness.   Musculoskeletal: Normal range of motion.  Neurological: She is alert and oriented to person, place, and time.  Skin: Skin is warm and dry.  Psychiatric: She has a normal mood and affect.    MAU Course  Procedures  MDM -wet prep: positive for clue - GC CT pending -UC Ct pending   Assessment and Plan  Bacterial vaginosis  1. Patient to be discharged home 2. Given 1 gram of zithomax in MAU for possible chlamydia. She knows we will call her if she needs to be treated for chlamydia as well.  3. RX for flagyl with refills sent to pharmacy.  4. Encouraged patient to use condoms and follow up with her doctor.   Mervyn Skeeters K[place]ra CNM 02/09/2017, 10:00 AM

## 2017-02-09 NOTE — Discharge Instructions (Signed)
Chlamydia, Female Chlamydia is an infection. It is spread from one person to another person during sexual contact. This infection can be in the cervix, urine tube (urethra), throat, or bottom (rectum). This infection needs treatment. HOME CARE   Take your medicines (antibiotics) as told. Finish them even if you start to feel better.  Only take medicine as told by your doctor.  Tell your sex partner(s) that you have chlamydia. They must also be treated.  Do not have sex until your doctor says it is okay.  Rest.  Eat healthy. Drink enough fluids to keep your pee (urine) clear or pale yellow.  Keep all doctor visits as told. GET HELP IF:  You have pain when you pee.  You have belly pain.  You have vaginal discharge.  You have pain during sex.  You have bleeding between periods and after sex.  You have a fever. GET HELP RIGHT AWAY IF:   You feel sick to your stomach (nauseous) or you throw up (vomit).  You sweat much more than normal (diaphoresis).  You have trouble swallowing. This information is not intended to replace advice given to you by your health care provider. Make sure you discuss any questions you have with your health care provider. Document Released: 09/23/2008 Document Revised: 04/07/2016 Document Reviewed: 08/22/2013 Elsevier Interactive Patient Education  2017 [employer].

## 2017-02-09 NOTE — MAU Note (Signed)
Vaginal burning and itching.  Burning with urination since Sat. States her urine smells really bad.  Pt took AZO x 2 days and then started Flagyl  Sat evening.

## 2017-02-10 LAB — GC/CHLAMYDIA PROBE AMP (~~LOC~~) NOT AT ARMC
Chlamydia: NEGATIVE
Neisseria Gonorrhea: NEGATIVE

## 2017-02-11 LAB — CULTURE, OB URINE: Culture: >=100000 — AB

## 2017-02-12 ENCOUNTER — Other Ambulatory Visit: Payer: Self-pay | Admitting: Advanced Practice Midwife

## 2017-02-12 DIAGNOSIS — N390 Urinary tract infection, site not specified: Secondary | ICD-10-CM

## 2017-02-12 DIAGNOSIS — N3000 Acute cystitis without hematuria: Secondary | ICD-10-CM

## 2017-02-12 HISTORY — DX: Urinary tract infection, site not specified: N39.0

## 2017-02-12 MED ORDER — SULFAMETHOXAZOLE-TRIMETHOPRIM 800-160 MG PO TABS: 1.0000 | ORAL_TABLET | Freq: Two times a day (BID) | ORAL | 1 refills | Status: DC

## 2017-02-12 NOTE — Progress Notes (Signed)
E Coli UTI Rx Septra DS x 7 d  No answer, busy signal

## 2017-02-13 ENCOUNTER — Other Ambulatory Visit: Payer: Self-pay | Admitting: Medical

## 2017-02-13 ENCOUNTER — Encounter: Payer: Self-pay | Admitting: Medical

## 2017-02-13 ENCOUNTER — Telehealth: Payer: Self-pay | Admitting: Medical

## 2017-02-13 DIAGNOSIS — N760 Acute vaginitis: Principal | ICD-10-CM

## 2017-02-13 DIAGNOSIS — B9689 Other specified bacterial agents as the cause of diseases classified elsewhere: Secondary | ICD-10-CM

## 2017-02-13 MED ORDER — METRONIDAZOLE 0.75 % VA GEL: 1.0000 | Freq: Two times a day (BID) | VAGINAL | 0 refills | Status: DC

## 2017-02-13 NOTE — Telephone Encounter (Signed)
Patient returned call to MAU today. Advised of need for Rx for UTI. Patient voiced understanding. She also states that she is having difficulty tolerating Flagyl and requests Rx for Metrogel. Rx sent. Patient advised to continue AZO tabs for symptomatic relief and increase PO hydration.   Luvenia Redden, PA-C 02/13/2017 10:26 AM

## 2017-03-30 ENCOUNTER — Other Ambulatory Visit: Payer: Self-pay | Admitting: Medical

## 2017-03-30 DIAGNOSIS — N760 Acute vaginitis: Principal | ICD-10-CM

## 2017-03-30 DIAGNOSIS — B9689 Other specified bacterial agents as the cause of diseases classified elsewhere: Secondary | ICD-10-CM

## 2017-04-17 ENCOUNTER — Emergency Department (HOSPITAL_COMMUNITY)
Admission: EM | Admit: 2017-04-17 | Discharge: 2017-04-17 | Disposition: A | Discharge status: Home / Self Care | Payer: Medicaid Other | Attending: Emergency Medicine | Admitting: Emergency Medicine

## 2017-04-17 ENCOUNTER — Emergency Department (HOSPITAL_COMMUNITY): Payer: Medicaid Other

## 2017-04-17 ENCOUNTER — Encounter (HOSPITAL_COMMUNITY): Payer: Self-pay | Admitting: *Deleted

## 2017-04-17 DIAGNOSIS — R51 Headache: Secondary | ICD-10-CM | POA: Insufficient documentation

## 2017-04-17 DIAGNOSIS — J45909 Unspecified asthma, uncomplicated: Secondary | ICD-10-CM | POA: Insufficient documentation

## 2017-04-17 DIAGNOSIS — Z79899 Other long term (current) drug therapy: Secondary | ICD-10-CM | POA: Diagnosis not present

## 2017-04-17 DIAGNOSIS — F17210 Nicotine dependence, cigarettes, uncomplicated: Secondary | ICD-10-CM | POA: Insufficient documentation

## 2017-04-17 DIAGNOSIS — R519 Headache, unspecified: Secondary | ICD-10-CM

## 2017-04-17 DIAGNOSIS — I10 Essential (primary) hypertension: Secondary | ICD-10-CM | POA: Insufficient documentation

## 2017-04-17 DIAGNOSIS — R42 Dizziness and giddiness: Secondary | ICD-10-CM

## 2017-04-17 LAB — BASIC METABOLIC PANEL
Anion gap: 13 (ref 5–15)
BUN: 13 mg/dL (ref 6–20)
CALCIUM: 9.6 mg/dL (ref 8.9–10.3)
CO2: 21 mmol/L — ABNORMAL LOW (ref 22–32)
CREATININE: 0.86 mg/dL (ref 0.44–1.00)
Chloride: 106 mmol/L (ref 101–111)
GFR calc Af Amer: >60 mL/min (ref >60)
GFR calc non Af Amer: >60 mL/min (ref >60)
Glucose, Bld: 100 mg/dL — ABNORMAL HIGH (ref 65–99)
Potassium: 4.1 mmol/L (ref 3.5–5.1)
Sodium: 140 mmol/L (ref 135–145)

## 2017-04-17 LAB — I-STAT BETA HCG BLOOD, ED (MC, WL, AP ONLY): I-stat hCG, quantitative: <5 m[IU]/mL (ref <5)

## 2017-04-17 LAB — CBC WITH DIFFERENTIAL/PLATELET
BASOS ABS: 0 10*3/uL (ref 0.0–0.1)
BASOS PCT: 0 %
EOS PCT: 1 %
Eosinophils Absolute: 0.1 10*3/uL (ref 0.0–0.7)
HCT: 40.2 % (ref 36.0–46.0)
Hemoglobin: 13.9 g/dL (ref 12.0–15.0)
Lymphocytes Relative: 30 %
Lymphs Abs: 2.1 10*3/uL (ref 0.7–4.0)
MCH: 30.6 pg (ref 26.0–34.0)
MCHC: 34.6 g/dL (ref 30.0–36.0)
MCV: 88.5 fL (ref 78.0–100.0)
MONO ABS: 0.4 10*3/uL (ref 0.1–1.0)
Monocytes Relative: 6 %
Neutro Abs: 4.3 10*3/uL (ref 1.7–7.7)
Neutrophils Relative %: 63 %
PLATELETS: 183 10*3/uL (ref 150–400)
RBC: 4.54 MIL/uL (ref 3.87–5.11)
RDW: 13.6 % (ref 11.5–15.5)
WBC: 6.9 10*3/uL (ref 4.0–10.5)

## 2017-04-17 NOTE — ED Notes (Signed)
MD Delo at the bedside.  

## 2017-04-17 NOTE — Discharge Instructions (Signed)
Tylenol 1000 mg rotated with ibuprofen 600 mg every 4 hours as needed for headache.  Follow-up with [facility] neurology if you're not improving in the next several days. The contact information has been provided in this discharge summary for you to call and make these arrangements.

## 2017-04-17 NOTE — ED Triage Notes (Signed)
Pt c/o posterior headaches for months worsening over the last 3 weeks with dizziness, pt denies blurred vision, denies head injury, ambulatory, no slurred speech, no facial droop, A&O x4

## 2017-04-17 NOTE — ED Provider Notes (Signed)
[facility] DEPT Provider Note   CSN: [id number] Arrival date & time: 04/17/17  0814     History   Chief Complaint Chief Complaint  Patient presents with  . Headache  . Dizziness    HPI Raven [name] is a 36 y.o. female.  Patient is a 36 year old female with past medical history of anxiety, fibromyalgia, cardiomyopathy, hypertension. She presents for evaluation of headaches and dizziness that she reports of been occurring intermittently for the past several months and are increasing in frequency. She reports "sharp pains" to the right side of her head that occur several times daily. These are described as sharp and occur repeatedly until she is able to rest, remain still, and finally get them to stop. She also describes intermittent dizziness that occurs independent of these headaches. She describes this as a spinning sensation and also has a lightheadedness/presyncopal feeling. She denies any new medications, injury or trauma, fevers or chills, or other associated symptoms.       Past Medical History:  Diagnosis Date  . Abnormal Pap smear   . Achalasia    esphog stretched several times  . Anxiety   . Arthritis   . Asthma    rarely uses inhaler  . Blood transfusion 01/2006   [place]  . Cardiomyopathy ([place])   . Constipation due to opioid therapy   . Depression   . Dysrhythmia   . Fibromyalgia   . GERD (gastroesophageal reflux disease)    no meds - diet controlled  . Heart murmur   . Hiatal hernia   . History of abnormal Pap smear   . History of chlamydia 1990's  . HSV-2 infection   . HTN (hypertension)   . Pneumonia 05/2013  . PVC's (premature ventricular contractions)   . Shortness of breath dyspnea   . Status post dilation of esophageal narrowing   . Vaginal Pap smear, abnormal     Patient Active Problem List   Diagnosis Date Noted  . UTI (urinary tract infection) 02/12/2017  . Gallstones and inflammation of gallbladder without obstruction 01/23/2015  . S/P  laparoscopic surgery 01/23/2015  . Upper airway cough syndrome 09/05/2014  . Smoker 08/09/2014  . Dyspnea 08/08/2014  . Abdominal pain, left lower quadrant 03/27/2014  . Cardiomyopathy, idiopathic ([place]) 01/09/2014  . History of chlamydia 01/09/2013  . 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  . History of abnormal Pap smear 01/09/2013  . Achalasia   . Abnormal Pap smear   . Depression   . HSV-2 infection   . Heart palpitations   . Heart murmur   . Anxiety   . GERD (gastroesophageal reflux disease)   . 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 PVC's (premature ventricular contractions) 05/19/2011  . Palpitation 04/11/2011  . ESOPHAGEAL STRICTURE 08/10/2009  . DYSPHAGIA 08/10/2009  . Esophageal reflux 11/30/2008  . Unspecified constipation 11/30/2008  . DEPRESSION 10/09/2008  . ASTHMA 10/09/2008    Past Surgical History:  Procedure Laterality Date  . ADENOIDECTOMY  12/2002  . BLADDER SUSPENSION  06/11/2012   Procedure: TRANSVAGINAL TAPE (TVT) PROCEDURE;  Surgeon: Delice Lesch, MD;  Location: [facility];  Service: Gynecology;  Laterality: N/A;  Tension Free Vaginal Tape/cystoscopy  . BREAST SURGERY  07/2001   tumor removed right breast  . CHOLECYSTECTOMY N/A 01/23/2015   Procedure: LAPAROSCOPIC CHOLECYSTECTOMY ;  Surgeon: Fanny Skates, MD;  Location: [facility];  Service: General;  Laterality: N/A;  case booked with cholangiogram but MD did not complete intraoperative cholangiogram   . CYSTOSCOPY  06/11/2012   Procedure: CYSTOSCOPY;  Surgeon: Delice Lesch, MD;  Location: [facility];  Service: Gynecology;  Laterality: N/A;  . DILATION AND CURETTAGE OF UTERUS  1999  . DILATION AND CURETTAGE OF UTERUS  01/2006   retained products  . ESOPHAGOGASTRODUODENOSCOPY     with esophageal dilation x 3  . FOREIGN BODY REMOVAL  06/11/2012   Procedure: REMOVAL FOREIGN BODY EXTREMITY;  Surgeon: Delice Lesch, MD;  Location: [facility];  Service: Gynecology;  Laterality: Right;  Removal of Implanon   .  LAPAROSCOPIC TUBAL LIGATION  06/11/2012   Procedure: LAPAROSCOPIC TUBAL LIGATION;  Surgeon: Delice Lesch, MD;  Location: [facility];  Service: Gynecology;  Laterality: Bilateral;  Laparoscopic Bilateral Fulgeration  . TONSILLECTOMY    . TUBAL LIGATION      OB History    Gravida Para Term Preterm AB Living   4 2 2  0 2 2   SAB TAB Ectopic Multiple Live Births   2               Home Medications    Prior to Admission medications   Medication Sig Start Date End Date Taking? Authorizing Provider  albuterol (PROVENTIL HFA;VENTOLIN HFA) 108 (90 BASE) MCG/ACT inhaler Inhale 2 puffs into the lungs every 6 (six) hours as needed for wheezing or shortness of breath. 11/10/14   Rolland Porter, MD  carisoprodol (SOMA) 350 MG tablet Take 1 tablet by mouth 3 (three) times daily as needed. 05/09/16   Historical Provider, MD  clonazePAM (KLONOPIN) 1 MG tablet Take 1 mg by mouth at bedtime as needed. For anxiety    Historical Provider, MD  dicyclomine (BENTYL) 20 MG tablet Take 1 tablet (20 mg total) by mouth 2 (two) times daily. Patient not taking: Reported on 02/09/2017 10/27/16   Orpah Greek, MD  LYRICA 75 MG capsule Take 75 mg by mouth 2 (two) times daily. 06/26/16   Historical Provider, MD  metroNIDAZOLE (METROGEL VAGINAL) 0.75 % vaginal gel Place 1 Applicatorful vaginally 2 (two) times daily. 02/13/17   Luvenia Redden, PA-C  oxyCODONE-acetaminophen (PERCOCET) 10-325 MG tablet Take 1-2 tablets by mouth every 4 (four) hours as needed for pain.  06/09/16   Historical Provider, MD  pantoprazole (PROTONIX) 20 MG tablet Take 1 tablet (20 mg total) by mouth daily. Patient not taking: Reported on 02/09/2017 10/27/16   Orpah Greek, MD  polyethylene glycol (MIRALAX / Floria Raveling) packet Take 17 g by mouth daily. Patient not taking: Reported on 02/09/2017 10/27/16   Orpah Greek, MD  sucralfate (CARAFATE) 1 GM/10ML suspension Take 10 mLs (1 g total) by mouth 4 (four) times daily -  with meals and  at bedtime. Patient not taking: Reported on 02/09/2017 10/27/16   Orpah Greek, MD  sulfamethoxazole-trimethoprim (BACTRIM DS,SEPTRA DS) 800-160 MG tablet Take 1 tablet by mouth 2 (two) times daily. 02/12/17   Seabron Spates, CNM    Family History Family History  Problem Relation Age of Onset  . Heart disease Father   . Hypertension Father   . Stroke Mother   . Hypertension Mother   . Heart attack Mother   . Breast cancer Mother   . Neuropathy Mother   . Arthritis Mother   . Asthma Mother   . Neuropathy Sister   . Arthritis Sister   . Asthma Sister     Social History Social History  Substance Use Topics  . Smoking status: Current Some Day Smoker    Packs/day: 0.25  Years: 15.00    Types: Cigarettes    Last attempt to quit: 04/14/2015  . Smokeless tobacco: Never Used     Comment: HAS CUT BACK  . Alcohol use No     Comment: on occasion     Allergies   Dust mite extract; Other; and Pollen extract   Review of Systems Review of Systems  All other systems reviewed and are negative.    Physical Exam Updated Vital Signs BP 131/75 (BP Location: Right Arm)   Pulse 80   Resp (!) 21   SpO2 100%   Physical Exam  Constitutional: She is oriented to person, place, and time. She appears well-developed and well-nourished. No distress.  HENT:  Head: Normocephalic and atraumatic.  Mouth/Throat: Oropharynx is clear and moist.  Eyes: EOM are normal. Pupils are equal, round, and reactive to light.  Neck: Normal range of motion. Neck supple.  Cardiovascular: Normal rate and regular rhythm.  Exam reveals no gallop and no friction rub.   No murmur heard. Pulmonary/Chest: Effort normal and breath sounds normal. No respiratory distress. She has no wheezes.  Abdominal: Soft. Bowel sounds are normal. She exhibits no distension. There is no tenderness.  Musculoskeletal: Normal range of motion.  Neurological: She is alert and oriented to person, place, and time. No cranial  nerve deficit. She exhibits normal muscle tone. Coordination normal.  Skin: Skin is warm and dry. She is not diaphoretic.  Nursing note and vitals reviewed.    ED Treatments / Results  Labs (all labs ordered are listed, but only abnormal results are displayed) Labs Reviewed  BASIC METABOLIC PANEL  CBC WITH DIFFERENTIAL/PLATELET  I-STAT BETA HCG BLOOD, ED ([facility], [facility], [facility] ONLY)    EKG  EKG Interpretation None       Radiology No results found.  Procedures Procedures (including critical care time)  Medications Ordered in ED Medications - No data to display   Initial Impression / Assessment and Plan / ED Course  I have reviewed the triage vital signs and the nursing notes.  Pertinent labs & imaging results that were available during my care of the patient were reviewed by me and considered in my medical decision making (see chart for details).  Workup reveals no cause of her dizziness or headaches. Her head CT is negative and laboratory studies are normal. I'm uncertain as to the etiology of her headaches, however nothing today appears emergent. If her headaches persist, she will be given the follow-up information for neurology to call and arrange an appointment.  Final Clinical Impressions(s) / ED Diagnoses   Final diagnoses:  None    New Prescriptions New Prescriptions   No medications on file     Veryl Speak, MD 04/17/17 1110

## 2017-04-21 ENCOUNTER — Ambulatory Visit (INDEPENDENT_AMBULATORY_CARE_PROVIDER_SITE_OTHER): Payer: Medicaid Other | Admitting: Neurology

## 2017-04-21 ENCOUNTER — Encounter: Payer: Self-pay | Admitting: Neurology

## 2017-04-21 VITALS — BP 121/80 | HR 94 | Ht 69.0 in | Wt 206.0 lb

## 2017-04-21 DIAGNOSIS — R202 Paresthesia of skin: Secondary | ICD-10-CM | POA: Diagnosis not present

## 2017-04-21 DIAGNOSIS — G479 Sleep disorder, unspecified: Secondary | ICD-10-CM

## 2017-04-21 DIAGNOSIS — M5481 Occipital neuralgia: Secondary | ICD-10-CM | POA: Diagnosis not present

## 2017-04-21 DIAGNOSIS — M797 Fibromyalgia: Secondary | ICD-10-CM | POA: Insufficient documentation

## 2017-04-21 HISTORY — DX: Occipital neuralgia: M54.81

## 2017-04-21 MED ORDER — PREGABALIN 100 MG PO CAPS: 100.0000 mg | ORAL_CAPSULE | Freq: Two times a day (BID) | ORAL | 3 refills | Status: DC

## 2017-04-21 NOTE — Progress Notes (Signed)
Reason for visit: Headache  Referring physician: Dr. Delman Cheadle Raven [name] is a 36 y.o. female  History of present illness:  Raven [name] is a 36 year old left-handed black female with a history of fibromyalgia. The patient has a three-year history of discomfort that has been persistent and migratory throughout the body. The patient will have sharp pains in the left wrist and some numbness sensations that go up the arm. The patient will have sharp pains in the legs and burning sensations and numbness in the feet and arms. The patient does have some spasms that occur in the midsection around the upper abdomen that come and go. The patient denies any neck stiffness. She has also had episodes of sharp jabbing pains in the right occipital area going to the top of the head that have increased in frequency over the last 3 years. The patient is having several events a day at this point. She went to the emergency room on 04/17/2017 for evaluation. CT scan of the brain done was unremarkable, the patient has known hypoplasia of the left cerebellum, MRI evaluation of the brain was done in April 2016 that was unremarkable with exception of the cerebellar hyperplasia. The patient indicates a chronic issue with gait instability, she will tend to veer to the right with walking. She reports some decrease in memory, excessive daytime drowsiness, difficulty staying awake when she is inactive. The patient snores at night, she will wake up choking at night. The patient reports bilateral tinnitus. She has dizziness off and on. The patient has generalized fatigue throughout the day. She does have some urinary urgency and incontinence, she has had resuspension of the bladder in the past. She denies in blackout episodes. The patient is sent to this office for further evaluation. She is on Lyrica 75 mg twice daily for the fibromyalgia without complete control her symptoms.  Past Medical History:  Diagnosis Date  . Abnormal  Pap smear   . Achalasia    esphog stretched several times  . Anxiety   . Arthritis   . Asthma    rarely uses inhaler  . Blood transfusion 01/2006   [place]  . Cardiomyopathy ([place])   . Constipation due to opioid therapy   . Depression   . Dysrhythmia   . Fibromyalgia   . GERD (gastroesophageal reflux disease)    no meds - diet controlled  . Heart murmur   . Hiatal hernia   . History of abnormal Pap smear   . History of chlamydia 1990's  . HSV-2 infection   . HTN (hypertension)   . Pneumonia 05/2013  . PVC's (premature ventricular contractions)   . Shortness of breath dyspnea   . Status post dilation of esophageal narrowing   . Vaginal Pap smear, abnormal     Past Surgical History:  Procedure Laterality Date  . ADENOIDECTOMY  12/2002  . BLADDER SUSPENSION  06/11/2012   Procedure: TRANSVAGINAL TAPE (TVT) PROCEDURE;  Surgeon: Delice Lesch, MD;  Location: [facility];  Service: Gynecology;  Laterality: N/A;  Tension Free Vaginal Tape/cystoscopy  . BREAST SURGERY  07/2001   tumor removed right breast  . CHOLECYSTECTOMY N/A 01/23/2015   Procedure: LAPAROSCOPIC CHOLECYSTECTOMY ;  Surgeon: Fanny Skates, MD;  Location: [facility];  Service: General;  Laterality: N/A;  case booked with cholangiogram but MD did not complete intraoperative cholangiogram   . CYSTOSCOPY  06/11/2012   Procedure: CYSTOSCOPY;  Surgeon: Delice Lesch, MD;  Location: [facility];  Service: Gynecology;  Laterality: N/A;  . DILATION AND CURETTAGE OF UTERUS  1999  . DILATION AND CURETTAGE OF UTERUS  01/2006   retained products  . ESOPHAGOGASTRODUODENOSCOPY     with esophageal dilation x 3  . FOREIGN BODY REMOVAL  06/11/2012   Procedure: REMOVAL FOREIGN BODY EXTREMITY;  Surgeon: Delice Lesch, MD;  Location: [facility];  Service: Gynecology;  Laterality: Right;  Removal of Implanon   . LAPAROSCOPIC TUBAL LIGATION  06/11/2012   Procedure: LAPAROSCOPIC TUBAL LIGATION;  Surgeon: Delice Lesch, MD;  Location: [facility];  Service:  Gynecology;  Laterality: Bilateral;  Laparoscopic Bilateral Fulgeration  . TONSILLECTOMY    . TUBAL LIGATION      Family History  Problem Relation Age of Onset  . Heart disease Father   . Hypertension Father   . Stroke Mother   . Hypertension Mother   . Heart attack Mother   . Breast cancer Mother   . Neuropathy Mother   . Arthritis Mother   . Asthma Mother   . Neuropathy Sister   . Arthritis Sister   . Asthma Sister     Social history:  reports that she has been smoking Cigarettes.  She has a 3.75 pack-year smoking history. She has never used smokeless tobacco. She reports that she does not drink alcohol or use drugs.  Medications:  Prior to Admission medications   Medication Sig Start Date End Date Taking? Authorizing Provider  carisoprodol (SOMA) 350 MG tablet Take 1 tablet by mouth 3 (three) times daily as needed. 05/09/16  Yes Historical Provider, MD  clonazePAM (KLONOPIN) 2 MG tablet Take 2 mg by mouth every 8 (eight) hours. 04/02/17  Yes Historical Provider, MD  LYRICA 75 MG capsule Take 75 mg by mouth 2 (two) times daily. 06/26/16  Yes Historical Provider, MD  metroNIDAZOLE (METROGEL VAGINAL) 0.75 % vaginal gel Place 1 Applicatorful vaginally 2 (two) times daily. 02/13/17  Yes Luvenia Redden, PA-C  oxyCODONE-acetaminophen (PERCOCET) 10-325 MG tablet Take 1-2 tablets by mouth every 8 (eight) hours as needed for pain.  06/09/16  Yes Historical Provider, MD      Allergies  Allergen Reactions  . Dust Mite Extract Shortness Of Breath  . Other Shortness Of Breath    Feathers, trees "almost everything outside"  . Pollen Extract Shortness Of Breath    ROS:  Out of a complete 14 system review of symptoms, the patient complains only of the following symptoms, and all other reviewed systems are negative.  Fatigue Ringing in the ears, spinning sensations Skin rash, itching Blurred vision Easy bruising Joint pain, joint swelling, muscle cramps, aching muscles Allergies, skin  sensitivity Memory loss, confusion, headache, numbness, weakness, difficulty swallowing, dizziness Anxiety Insomnia, snoring, restless legs  Blood pressure 121/80, pulse 94, height 5\' 9"  (1.753 m), weight 206 lb (93.4 kg), last menstrual period 03/27/2017.  Physical Exam  General: The patient is alert and cooperative at the time of the examination.  Eyes: Pupils are equal, round, and reactive to light. Discs are flat bilaterally.  Neck: The neck is supple, no carotid bruits are noted.  Respiratory: The respiratory examination is clear.  Cardiovascular: The cardiovascular examination reveals a regular rate and rhythm, no obvious murmurs or rubs are noted.  Neuromuscular: Range of movement of the cervical spine and lumbar spine are full.  Skin: Extremities are without significant edema.  Neurologic Exam  Mental status: The patient is alert and oriented x 3 at the time of the examination. The patient has apparent normal  recent and remote memory, with an apparently normal attention span and concentration ability.  Cranial nerves: Facial symmetry is present. There is good sensation of the face to pinprick and soft touch bilaterally. The strength of the facial muscles and the muscles to head turning and shoulder shrug are normal bilaterally. Speech is well enunciated, no aphasia or dysarthria is noted. Extraocular movements are full. Visual fields are full. The tongue is midline, and the patient has symmetric elevation of the soft palate. No obvious hearing deficits are noted.  Motor: The motor testing reveals 5 over 5 strength of all 4 extremities. Good symmetric motor tone is noted throughout.  Sensory: Sensory testing is intact to pinprick, soft touch, vibration sensation, and position sense on all 4 extremities, with exception of decreased pinprick sensation on the left arm and left leg, decreased vibration sensation on the left foot. No evidence of extinction is noted.  Coordination:  Cerebellar testing reveals good finger-nose-finger and heel-to-shin bilaterally.  Gait and station: Gait is normal. Tandem gait is normal. Romberg is negative. No drift is seen.  Reflexes: Deep tendon reflexes are symmetric and normal bilaterally. Toes are downgoing bilaterally.   CT head 04/17/17:  IMPRESSION: 1. No acute finding. 2. Known left cerebellar dysmorphism.  * CT scan images were reviewed online. I agree with the written report.    Assessment/Plan:  1. History of fibromyalgia  2. Right occipital neuralgia  3. Excessive daytime drowsiness  4. Anxiety disorder  The patient has a multitude of somatic complaints. She reports sharp jabbing pains in the right occipital area that likely represent occipital neuralgia. The Lyrica dose will be increased taking 100 mg twice daily. Carbamazepine may be used in the future. The patient will be referred for a sleep evaluation given the history of excessive daytime drowsiness, snoring, and waking up choking at night. The patient will undergo nerve conduction studies of both legs, left arm and EMG of the left arm given her pain complaints and paresthesias on all 4 extremities. The patient will be sent for further blood work today. She will follow-up in 3 months, she will come back for the EMG evaluation.  Jill Alexanders MD 04/21/2017 9:38 AM  [facility] [address] [place], [place] [address]  Phone [phone] Fax [phone]

## 2017-04-21 NOTE — Patient Instructions (Signed)
   We will get blood work today and go up on the lyrica dose to 100 mg twice a day. I will get a referral to a sleep doctor for the excessive daytime drowsiness.  We will get EMG and NCV evaluation to look at the nerve function of the arms and legs.

## 2017-04-22 ENCOUNTER — Ambulatory Visit (INDEPENDENT_AMBULATORY_CARE_PROVIDER_SITE_OTHER): Payer: Medicaid Other | Admitting: Neurology

## 2017-04-22 ENCOUNTER — Ambulatory Visit (INDEPENDENT_AMBULATORY_CARE_PROVIDER_SITE_OTHER): Payer: Self-pay | Admitting: Neurology

## 2017-04-22 ENCOUNTER — Encounter: Payer: Self-pay | Admitting: Neurology

## 2017-04-22 ENCOUNTER — Institutional Professional Consult (permissible substitution): Payer: Medicaid Other | Admitting: Neurology

## 2017-04-22 DIAGNOSIS — M797 Fibromyalgia: Secondary | ICD-10-CM | POA: Diagnosis not present

## 2017-04-22 DIAGNOSIS — R202 Paresthesia of skin: Secondary | ICD-10-CM

## 2017-04-22 LAB — VITAMIN B12: VITAMIN B 12: 267 pg/mL (ref 232–1245)

## 2017-04-22 LAB — RPR: RPR: NONREACTIVE

## 2017-04-22 LAB — HIV ANTIBODY (ROUTINE TESTING W REFLEX): HIV Screen 4th Generation wRfx: NONREACTIVE

## 2017-04-22 LAB — SEDIMENTATION RATE: Sed Rate: 3 mm/hr (ref 0–32)

## 2017-04-22 LAB — B. BURGDORFI ANTIBODIES: Lyme IgG/IgM Ab: <0.91 {ISR} (ref 0.00–0.90)

## 2017-04-22 LAB — ANGIOTENSIN CONVERTING ENZYME: ANGIO CONVERT ENZYME: 28 U/L (ref 14–82)

## 2017-04-22 LAB — ANA W/REFLEX: Anti Nuclear Antibody(ANA): NEGATIVE

## 2017-04-22 LAB — RHEUMATOID FACTOR: Rheumatoid fact SerPl-aCnc: <10 IU/mL (ref 0.0–13.9)

## 2017-04-22 NOTE — Progress Notes (Signed)
Please refer to EMG and nerve conduction study procedure note. 

## 2017-04-22 NOTE — Procedures (Signed)
     HISTORY:  Raven [name] is a 36 year old patient with a history of fibromyalgia with sharp jabbing pains in the left hand and wrist and dysesthesias on all 4 extremities. The patient is being evaluated for a possible neuropathy or a radiculopathy.  NERVE CONDUCTION STUDIES:  Nerve conduction studies were performed on the left upper extremity. The distal motor latencies and motor amplitudes for the median and ulnar nerves were within normal limits. The F wave latencies and nerve conduction velocities for these nerves were also normal. The sensory latencies for the median and ulnar nerves were normal.  Nerve conduction studies were performed on both lower extremities. The distal motor latencies and motor amplitudes for the peroneal and posterior tibial nerves were within normal limits. The nerve conduction velocities for these nerves were also normal. The H reflex latencies were normal. The sensory latencies for the peroneal nerves were within normal limits.   EMG STUDIES:  EMG study was performed on the left upper extremity:  The first dorsal interosseous muscle reveals 2 to 4 K units with full recruitment. No fibrillations or positive waves were noted. The abductor pollicis brevis muscle reveals 2 to 4 K units with full recruitment. No fibrillations or positive waves were noted. The extensor indicis proprius muscle reveals 1 to 3 K units with full recruitment. No fibrillations or positive waves were noted. The pronator teres muscle reveals 2 to 3 K units with full recruitment. No fibrillations or positive waves were noted. The biceps muscle reveals 1 to 2 K units with full recruitment. No fibrillations or positive waves were noted. The triceps muscle reveals 2 to 4 K units with full recruitment. No fibrillations or positive waves were noted. The anterior deltoid muscle reveals 2 to 3 K units with full recruitment. No fibrillations or positive waves were noted. The cervical paraspinal  muscles were tested at 2 levels. No abnormalities of insertional activity were seen at either level tested. There was fair relaxation.   IMPRESSION:  Nerve conduction studies done on the left upper extremity and both lower extremities were unremarkable, without evidence of a peripheral neuropathy. EMG evaluation of the left upper extremity was normal, no evidence of a cervical radiculopathy is seen.  Jill Alexanders MD 04/22/2017 11:25 AM  [facility] [address] [place], [place] [address]  Phone [phone] Fax [phone]

## 2017-04-23 ENCOUNTER — Telehealth: Payer: Self-pay | Admitting: Neurology

## 2017-04-23 NOTE — Telephone Encounter (Signed)
Dr Jannifer Franklin- please advise. I have not gotten a result note for her yet. I am happy to call her back about results

## 2017-04-23 NOTE — Telephone Encounter (Signed)
This patient is active on my chart, the blood work results were released to her earlier this morning.

## 2017-04-23 NOTE — Telephone Encounter (Signed)
Called and spoke with patient about lab results per CW, MD note. She was concerned about rheumatoid factor showing value of 10. I clarified this with her and stated it shows less than 10 which is inside the ref range. Labs normal. She verbalized understanding and appreciation for call.

## 2017-04-23 NOTE — Telephone Encounter (Signed)
Patient called office in reference to requesting lab results.  Please call

## 2017-04-27 ENCOUNTER — Telehealth: Payer: Self-pay

## 2017-04-27 ENCOUNTER — Institutional Professional Consult (permissible substitution): Payer: Medicaid Other | Admitting: Neurology

## 2017-04-27 NOTE — Telephone Encounter (Signed)
Patient did not show to new sleep consult appt.  

## 2017-04-28 ENCOUNTER — Encounter: Payer: Self-pay | Admitting: Neurology

## 2017-05-21 ENCOUNTER — Institutional Professional Consult (permissible substitution): Payer: Medicaid Other | Admitting: Neurology

## 2017-06-01 ENCOUNTER — Other Ambulatory Visit: Payer: Self-pay | Admitting: Medical

## 2017-06-01 DIAGNOSIS — B9689 Other specified bacterial agents as the cause of diseases classified elsewhere: Secondary | ICD-10-CM

## 2017-06-01 DIAGNOSIS — N760 Acute vaginitis: Principal | ICD-10-CM

## 2017-07-06 ENCOUNTER — Emergency Department (HOSPITAL_COMMUNITY)
Admission: EM | Admit: 2017-07-06 | Discharge: 2017-07-06 | Disposition: A | Discharge status: Home / Self Care | Payer: Medicaid Other | Attending: Physician Assistant | Admitting: Physician Assistant

## 2017-07-06 ENCOUNTER — Encounter (HOSPITAL_COMMUNITY): Payer: Self-pay | Admitting: Emergency Medicine

## 2017-07-06 DIAGNOSIS — F17210 Nicotine dependence, cigarettes, uncomplicated: Secondary | ICD-10-CM | POA: Diagnosis not present

## 2017-07-06 DIAGNOSIS — R102 Pelvic and perineal pain: Secondary | ICD-10-CM | POA: Insufficient documentation

## 2017-07-06 DIAGNOSIS — R300 Dysuria: Secondary | ICD-10-CM | POA: Insufficient documentation

## 2017-07-06 DIAGNOSIS — Z79899 Other long term (current) drug therapy: Secondary | ICD-10-CM | POA: Diagnosis not present

## 2017-07-06 DIAGNOSIS — J45909 Unspecified asthma, uncomplicated: Secondary | ICD-10-CM | POA: Insufficient documentation

## 2017-07-06 DIAGNOSIS — N390 Urinary tract infection, site not specified: Secondary | ICD-10-CM

## 2017-07-06 DIAGNOSIS — R1084 Generalized abdominal pain: Secondary | ICD-10-CM | POA: Diagnosis present

## 2017-07-06 LAB — URINALYSIS, ROUTINE W REFLEX MICROSCOPIC
Bilirubin Urine: NEGATIVE
Glucose, UA: NEGATIVE mg/dL
Ketones, ur: NEGATIVE mg/dL
Nitrite: POSITIVE — AB
Protein, ur: 30 mg/dL — AB
SPECIFIC GRAVITY, URINE: 1.012 (ref 1.005–1.030)
pH: 5 (ref 5.0–8.0)

## 2017-07-06 LAB — POC URINE PREG, ED: PREG TEST UR: NEGATIVE

## 2017-07-06 MED ORDER — CEPHALEXIN 500 MG PO CAPS: 500.0000 mg | ORAL_CAPSULE | Freq: Four times a day (QID) | ORAL | 0 refills | Status: DC

## 2017-07-06 MED ORDER — CEPHALEXIN 250 MG/5ML PO SUSR
500.0000 mg | Freq: Once | ORAL | Status: AC
  Administered 2017-07-06: 500 mg via ORAL
  Filled 2017-07-06: qty 10

## 2017-07-06 MED ORDER — ONDANSETRON 4 MG PO TBDP: 4.0000 mg | ORAL_TABLET | Freq: Three times a day (TID) | ORAL | 0 refills | Status: DC | PRN

## 2017-07-06 MED ORDER — ONDANSETRON 4 MG PO TBDP
4.0000 mg | ORAL_TABLET | Freq: Once | ORAL | Status: AC
  Administered 2017-07-06: 4 mg via ORAL
  Filled 2017-07-06: qty 1

## 2017-07-06 MED ORDER — CEPHALEXIN 250 MG PO CAPS
500.0000 mg | ORAL_CAPSULE | Freq: Once | ORAL | Status: DC
  Filled 2017-07-06: qty 2

## 2017-07-06 MED ORDER — CEPHALEXIN 250 MG/5ML PO SUSR: 500.0000 mg | Freq: Four times a day (QID) | ORAL | 0 refills | Status: AC

## 2017-07-06 MED ORDER — PHENAZOPYRIDINE HCL 200 MG PO TABS: 200.0000 mg | ORAL_TABLET | Freq: Three times a day (TID) | ORAL | 0 refills | Status: DC

## 2017-07-06 NOTE — Discharge Instructions (Signed)
Your urinary tract infection that was not responding to your antibiotic. We've switched her antibiotic. However if you do not get better in 24-48 hours please return to the emergency department. If you are unable to take her antibiotics because you are too ill to take them, return immediately.

## 2017-07-06 NOTE — ED Provider Notes (Signed)
[facility] DEPT Provider Note   CSN: [id number] Arrival date & time: 07/06/17  0944  By signing my name below, I, Ny'Kea Lewis, attest that this documentation has been prepared under the direction and in the presence of Mackuen, Courteney Lyn, *. Electronically Signed: Lise Auer, ED Scribe. 07/06/17. 1:59 PM.  History   Chief Complaint Chief Complaint  Patient presents with  . Flank Pain  . Dysuria  . Vaginal Itching  . Rash   The history is provided by the patient. No language interpreter was used.   HPI Comments: Raven [name] is a 36 y.o. female with a history cardiomyopathy, fibromyalgia, GERD, and HTN, who presents to the Emergency Department complaining of moderate, gradually worsening, sharp bilateral flank pain for one week. Pt notes associated dysuria, nausea, fever, chills, and vaginal pain. Her pain is exacerbated with ambulation and aspiration. She reports she has a history of recurrent UTI and had been prescribed Bactirm, but her symptoms are worsening.   Past Medical History:  Diagnosis Date  . Abnormal Pap smear   . Achalasia    esphog stretched several times  . Anxiety   . Arthritis   . Asthma    rarely uses inhaler  . Blood transfusion 01/2006   [place]  . Cardiomyopathy ([place])   . Constipation due to opioid therapy   . Depression   . Dysrhythmia   . Fibromyalgia   . GERD (gastroesophageal reflux disease)    no meds - diet controlled  . Heart murmur   . Hiatal hernia   . History of abnormal Pap smear   . History of chlamydia 1990's  . HSV-2 infection   . HTN (hypertension)   . Occipital neuralgia of right side 04/21/2017  . Pneumonia 05/2013  . PVC's (premature ventricular contractions)   . Shortness of breath dyspnea   . Status post dilation of esophageal narrowing   . Vaginal Pap smear, abnormal     Patient Active Problem List   Diagnosis Date Noted  . Occipital neuralgia of right side 04/21/2017  . Fibromyalgia 04/21/2017  . UTI (urinary  tract infection) 02/12/2017  . Gallstones and inflammation of gallbladder without obstruction 01/23/2015  . S/P laparoscopic surgery 01/23/2015  . Upper airway cough syndrome 09/05/2014  . Smoker 08/09/2014  . Dyspnea 08/08/2014  . Abdominal pain, left lower quadrant 03/27/2014  . Cardiomyopathy, idiopathic ([place]) 01/09/2014  . History of chlamydia 01/09/2013  . 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  . History of abnormal Pap smear 01/09/2013  . Achalasia   . Abnormal Pap smear   . Depression   . HSV-2 infection   . Heart palpitations   . Heart murmur   . Anxiety   . GERD (gastroesophageal reflux disease)   . 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 PVC's (premature ventricular contractions) 05/19/2011  . Palpitation 04/11/2011  . ESOPHAGEAL STRICTURE 08/10/2009  . DYSPHAGIA 08/10/2009  . Esophageal reflux 11/30/2008  . Unspecified constipation 11/30/2008  . DEPRESSION 10/09/2008  . ASTHMA 10/09/2008    Past Surgical History:  Procedure Laterality Date  . ADENOIDECTOMY  12/2002  . BLADDER SUSPENSION  06/11/2012   Procedure: TRANSVAGINAL TAPE (TVT) PROCEDURE;  Surgeon: Delice Lesch, MD;  Location: [facility];  Service: Gynecology;  Laterality: N/A;  Tension Free Vaginal Tape/cystoscopy  . BREAST SURGERY  07/2001   tumor removed right breast  . CHOLECYSTECTOMY N/A 01/23/2015   Procedure: LAPAROSCOPIC CHOLECYSTECTOMY ;  Surgeon: Fanny Skates, MD;  Location: [facility];  Service: General;  Laterality: N/A;  case booked with cholangiogram but MD did not complete intraoperative cholangiogram   . CYSTOSCOPY  06/11/2012   Procedure: CYSTOSCOPY;  Surgeon: Delice Lesch, MD;  Location: [facility];  Service: Gynecology;  Laterality: N/A;  . DILATION AND CURETTAGE OF UTERUS  1999  . DILATION AND CURETTAGE OF UTERUS  01/2006   retained products  . ESOPHAGOGASTRODUODENOSCOPY     with esophageal dilation x 3  . FOREIGN BODY REMOVAL  06/11/2012   Procedure: REMOVAL FOREIGN BODY EXTREMITY;  Surgeon:  Delice Lesch, MD;  Location: [facility];  Service: Gynecology;  Laterality: Right;  Removal of Implanon   . LAPAROSCOPIC TUBAL LIGATION  06/11/2012   Procedure: LAPAROSCOPIC TUBAL LIGATION;  Surgeon: Delice Lesch, MD;  Location: [facility];  Service: Gynecology;  Laterality: Bilateral;  Laparoscopic Bilateral Fulgeration  . TONSILLECTOMY    . TUBAL LIGATION      OB History    Gravida Para Term Preterm AB Living   5 2 2  0 2 2   SAB TAB Ectopic Multiple Live Births   2              Home Medications    Prior to Admission medications   Medication Sig Start Date End Date Taking? Authorizing Provider  carisoprodol (SOMA) 350 MG tablet Take 1 tablet by mouth 3 (three) times daily as needed. 05/09/16   [provider]  clonazePAM (KLONOPIN) 2 MG tablet Take 2 mg by mouth every 8 (eight) hours. 04/02/17   [provider]  metroNIDAZOLE (METROGEL VAGINAL) 0.75 % vaginal gel Place 1 Applicatorful vaginally 2 (two) times daily. 02/13/17   Luvenia Redden, PA-C  oxyCODONE-acetaminophen (PERCOCET) 10-325 MG tablet Take 1-2 tablets by mouth every 8 (eight) hours as needed for pain.  06/09/16   [provider]  pregabalin (LYRICA) 100 MG capsule Take 1 capsule (100 mg total) by mouth 2 (two) times daily. 04/21/17   Kathrynn Ducking, MD   Family History Family History  Problem Relation Age of Onset  . Heart disease Father   . Hypertension Father   . Stroke Mother   . Hypertension Mother   . Heart attack Mother   . Breast cancer Mother   . Neuropathy Mother   . Arthritis Mother   . Asthma Mother   . Neuropathy Sister   . Arthritis Sister   . Asthma Sister    Social History Social History  Substance Use Topics  . Smoking status: Current Some Day Smoker    Packs/day: 0.25    Years: 15.00    Types: Cigarettes    Last attempt to quit: 04/14/2015  . Smokeless tobacco: Never Used     Comment: HAS CUT BACK  . Alcohol use No     Comment: on occasion   Allergies   Dust  mite extract; Other; and Pollen extract  Review of Systems Review of Systems  Constitutional: Positive for chills and fever.  Gastrointestinal: Positive for nausea.  Genitourinary: Positive for dysuria, flank pain and vaginal pain.  All other systems reviewed and are negative.   Physical Exam Updated Vital Signs BP 111/70 (BP Location: Right Arm)   Pulse 100   Temp 98.5 F (36.9 C) (Oral)   Resp 17   Ht 5\' 9"  (1.753 m)   Wt 200 lb (90.7 kg)   LMP 06/23/2017   SpO2 99%   BMI 29.53 kg/m   Physical Exam  Constitutional: She is oriented to person, place, and time.  She appears well-developed and well-nourished.  HENT:  Head: Normocephalic.  Eyes: EOM are normal.  Neck: Normal range of motion.  Pulmonary/Chest: Effort normal and breath sounds normal.  Lungs CTA bilaterally.   Abdominal: Soft. She exhibits no distension.  Musculoskeletal: Normal range of motion.  Neurological: She is alert and oriented to person, place, and time.  Psychiatric: She has a normal mood and affect.  Nursing note and vitals reviewed.  ED Treatments / Results  DIAGNOSTIC STUDIES: Oxygen Saturation is 99% on RA, normal by my interpretation.   COORDINATION OF CARE: 1:53 PM-Discussed next steps with pt. Pt verbalized understanding and is agreeable with the plan.   Labs (all labs ordered are listed, but only abnormal results are displayed) Labs Reviewed  URINALYSIS, ROUTINE W REFLEX MICROSCOPIC - Abnormal; Notable for the following:       Result Value   APPearance CLOUDY (*)    Hgb urine dipstick MODERATE (*)    Protein, ur 30 (*)    Nitrite POSITIVE (*)    Leukocytes, UA LARGE (*)    Bacteria, UA MANY (*)    Squamous Epithelial / LPF 0-5 (*)    Non Squamous Epithelial 0-5 (*)    All other components within normal limits  POC URINE PREG, ED    EKG  EKG Interpretation None       Radiology No results found.  Procedures Procedures (including critical care time)  Medications  Ordered in ED Medications - No data to display   Initial Impression / Assessment and Plan / ED Course  I have reviewed the triage vital signs and the nursing notes.  Pertinent labs & imaging results that were available during my care of the patient were reviewed by me and considered in my medical decision making (see chart for details).     I personally performed the services described in this documentation, which was scribed in my presence. The recorded information has been reviewed and is accurate.   Patient is a 36 year old female presenting with UTI. Patient had multiple UTIs in the past. Patient was taking Bactrim but is not working. She came to the emergency department she continued to have symptoms. Her urine is positive for nitrates. Patient has no unilateral pain therefore do not suspect a stone. Patient has mild dysphasia is unable to swallow pills.  Patient had no vomiting. She only has mild tachycardia. I believe that it would be worth a trial of at home antibiotics. Return precautions given to patient.    Final Clinical Impressions(s) / ED Diagnoses   Final diagnoses:  None    New Prescriptions New Prescriptions   No medications on file     Macarthur Critchley, MD 07/06/17 1408

## 2017-07-06 NOTE — ED Triage Notes (Signed)
Pt.l stated, I've had burning when I pee, also have some vaginal discharge.  This has been going on for a week. I've taken some Bactrim and I have a rash. Earlier notice pain all around my back area.

## 2017-07-22 ENCOUNTER — Telehealth: Payer: Self-pay | Admitting: Neurology

## 2017-07-22 ENCOUNTER — Ambulatory Visit: Payer: Medicaid Other | Admitting: Neurology

## 2017-07-22 NOTE — Telephone Encounter (Signed)
This patient did not show for a revisit on today. This is the second no-show.

## 2017-07-23 ENCOUNTER — Encounter: Payer: Self-pay | Admitting: Neurology

## 2017-09-03 ENCOUNTER — Encounter (HOSPITAL_COMMUNITY): Payer: Self-pay | Admitting: *Deleted

## 2017-09-03 ENCOUNTER — Emergency Department (HOSPITAL_COMMUNITY)
Admission: EM | Admit: 2017-09-03 | Discharge: 2017-09-03 | Disposition: A | Discharge status: Home / Self Care | Payer: Medicaid Other | Attending: Emergency Medicine | Admitting: Emergency Medicine

## 2017-09-03 DIAGNOSIS — Y9389 Activity, other specified: Secondary | ICD-10-CM | POA: Diagnosis not present

## 2017-09-03 DIAGNOSIS — I429 Cardiomyopathy, unspecified: Secondary | ICD-10-CM | POA: Diagnosis not present

## 2017-09-03 DIAGNOSIS — I10 Essential (primary) hypertension: Secondary | ICD-10-CM | POA: Insufficient documentation

## 2017-09-03 DIAGNOSIS — Y999 Unspecified external cause status: Secondary | ICD-10-CM | POA: Insufficient documentation

## 2017-09-03 DIAGNOSIS — F17210 Nicotine dependence, cigarettes, uncomplicated: Secondary | ICD-10-CM | POA: Insufficient documentation

## 2017-09-03 DIAGNOSIS — T18128A Food in esophagus causing other injury, initial encounter: Secondary | ICD-10-CM | POA: Diagnosis not present

## 2017-09-03 DIAGNOSIS — X58XXXA Exposure to other specified factors, initial encounter: Secondary | ICD-10-CM | POA: Insufficient documentation

## 2017-09-03 DIAGNOSIS — Y929 Unspecified place or not applicable: Secondary | ICD-10-CM | POA: Diagnosis not present

## 2017-09-03 DIAGNOSIS — Z79899 Other long term (current) drug therapy: Secondary | ICD-10-CM | POA: Diagnosis not present

## 2017-09-03 NOTE — ED Triage Notes (Signed)
Pt states that she was eating last night when a piece of food lodged in her throat. Pt states that she  Tried to eat again today and it became worse. Pt reports sob and chest pain. Pt states that she has had multiple surgeries to stretch her esophagus.

## 2017-09-03 NOTE — ED Notes (Signed)
Pt. left the ER without notifying staff. Unable to locate pt.

## 2017-09-03 NOTE — ED Provider Notes (Signed)
[facility] DEPT Provider Note   CSN: [id number] Arrival date & time: 09/03/17  1809     History   Chief Complaint Chief Complaint  Patient presents with  . food lodged in throat    HPI Raven [name] is a 36 y.o. female.  HPI  36 year old female with a history of anxiety, acid reflux, achalasia status post several esophageal dilatations presents to the emergency department with sensation of food lost in her throat. Reports that this occurred last night. Tried to eat again this morning and felt like the food would go down, at which point the patient became anxious and started breathing rapidly which resulted in lightheadedness, and bilateral upper extremity tingling. This occurred couple hours prior to arrival. Since then her symptoms have resolved. She has not had any trouble breathing. Not currently complaining of any chest pain or shortness of breath. She is able to swallow her secretions and fluids. No other alleviating or aggravating factors. Denies any other physical complaints.  Past Medical History:  Diagnosis Date  . Abnormal Pap smear   . Achalasia    esphog stretched several times  . Anxiety   . Arthritis   . Asthma    rarely uses inhaler  . Blood transfusion 01/2006   [place]  . Cardiomyopathy ([place])   . Constipation due to opioid therapy   . Depression   . Dysrhythmia   . Fibromyalgia   . GERD (gastroesophageal reflux disease)    no meds - diet controlled  . Heart murmur   . Hiatal hernia   . History of abnormal Pap smear   . History of chlamydia 1990's  . HSV-2 infection   . HTN (hypertension)   . Occipital neuralgia of right side 04/21/2017  . Pneumonia 05/2013  . PVC's (premature ventricular contractions)   . Shortness of breath dyspnea   . Status post dilation of esophageal narrowing   . Vaginal Pap smear, abnormal     Patient Active Problem List   Diagnosis Date Noted  . Occipital neuralgia of right side 04/21/2017  . Fibromyalgia 04/21/2017  .  UTI (urinary tract infection) 02/12/2017  . Gallstones and inflammation of gallbladder without obstruction 01/23/2015  . S/P laparoscopic surgery 01/23/2015  . Upper airway cough syndrome 09/05/2014  . Smoker 08/09/2014  . Dyspnea 08/08/2014  . Abdominal pain, left lower quadrant 03/27/2014  . Cardiomyopathy, idiopathic ([place]) 01/09/2014  . History of chlamydia 01/09/2013  . 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  . History of abnormal Pap smear 01/09/2013  . Achalasia   . Abnormal Pap smear   . Depression   . HSV-2 infection   . Heart palpitations   . Heart murmur   . Anxiety   . GERD (gastroesophageal reflux disease)   . 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 PVC's (premature ventricular contractions) 05/19/2011  . Palpitation 04/11/2011  . ESOPHAGEAL STRICTURE 08/10/2009  . DYSPHAGIA 08/10/2009  . Esophageal reflux 11/30/2008  . Unspecified constipation 11/30/2008  . DEPRESSION 10/09/2008  . ASTHMA 10/09/2008    Past Surgical History:  Procedure Laterality Date  . ADENOIDECTOMY  12/2002  . BLADDER SUSPENSION  06/11/2012   Procedure: TRANSVAGINAL TAPE (TVT) PROCEDURE;  Surgeon: Delice Lesch, MD;  Location: [facility];  Service: Gynecology;  Laterality: N/A;  Tension Free Vaginal Tape/cystoscopy  . BREAST SURGERY  07/2001   tumor removed right breast  . CHOLECYSTECTOMY N/A 01/23/2015   Procedure: LAPAROSCOPIC CHOLECYSTECTOMY ;  Surgeon: Fanny Skates, MD;  Location: [facility];  Service:  General;  Laterality: N/A;  case booked with cholangiogram but MD did not complete intraoperative cholangiogram   . CYSTOSCOPY  06/11/2012   Procedure: CYSTOSCOPY;  Surgeon: Delice Lesch, MD;  Location: [facility];  Service: Gynecology;  Laterality: N/A;  . DILATION AND CURETTAGE OF UTERUS  1999  . DILATION AND CURETTAGE OF UTERUS  01/2006   retained products  . ESOPHAGOGASTRODUODENOSCOPY     with esophageal dilation x 3  . FOREIGN BODY REMOVAL  06/11/2012   Procedure: REMOVAL FOREIGN BODY  EXTREMITY;  Surgeon: Delice Lesch, MD;  Location: [facility];  Service: Gynecology;  Laterality: Right;  Removal of Implanon   . LAPAROSCOPIC TUBAL LIGATION  06/11/2012   Procedure: LAPAROSCOPIC TUBAL LIGATION;  Surgeon: Delice Lesch, MD;  Location: [facility];  Service: Gynecology;  Laterality: Bilateral;  Laparoscopic Bilateral Fulgeration  . TONSILLECTOMY    . TUBAL LIGATION      OB History    Gravida Para Term Preterm AB Living   5 2 2  0 2 2   SAB TAB Ectopic Multiple Live Births   2               Home Medications    Prior to Admission medications   Medication Sig Start Date End Date Taking? Authorizing Provider  carisoprodol (SOMA) 350 MG tablet Take 1 tablet by mouth 3 (three) times daily as needed. 05/09/16   [provider]  cephALEXin (KEFLEX) 500 MG capsule Take 1 capsule (500 mg total) by mouth 4 (four) times daily. 07/06/17   Mackuen, Courteney Lyn, MD  clonazePAM (KLONOPIN) 2 MG tablet Take 2 mg by mouth every 8 (eight) hours. 04/02/17   [provider]  metroNIDAZOLE (METROGEL VAGINAL) 0.75 % vaginal gel Place 1 Applicatorful vaginally 2 (two) times daily. 02/13/17   Luvenia Redden, PA-C  ondansetron (ZOFRAN ODT) 4 MG disintegrating tablet Take 1 tablet (4 mg total) by mouth every 8 (eight) hours as needed for nausea or vomiting. 07/06/17   Mackuen, Courteney Lyn, MD  ondansetron (ZOFRAN ODT) 4 MG disintegrating tablet Take 1 tablet (4 mg total) by mouth every 8 (eight) hours as needed for nausea or vomiting. 07/06/17   Mackuen, Courteney Lyn, MD  oxyCODONE-acetaminophen (PERCOCET) 10-325 MG tablet Take 1-2 tablets by mouth every 8 (eight) hours as needed for pain.  06/09/16   [provider]  phenazopyridine (PYRIDIUM) 200 MG tablet Take 1 tablet (200 mg total) by mouth 3 (three) times daily. 07/06/17   Mackuen, Courteney Lyn, MD  pregabalin (LYRICA) 100 MG capsule Take 1 capsule (100 mg total) by mouth 2 (two) times daily. 04/21/17   Kathrynn Ducking, MD     Family History Family History  Problem Relation Age of Onset  . Heart disease Father   . Hypertension Father   . Stroke Mother   . Hypertension Mother   . Heart attack Mother   . Breast cancer Mother   . Neuropathy Mother   . Arthritis Mother   . Asthma Mother   . Neuropathy Sister   . Arthritis Sister   . Asthma Sister     Social History Social History  Substance Use Topics  . Smoking status: Current Some Day Smoker    Packs/day: 0.25    Years: 15.00    Types: Cigarettes    Last attempt to quit: 04/14/2015  . Smokeless tobacco: Never Used     Comment: HAS CUT BACK  . Alcohol use No     Comment:  on occasion     Allergies   Dust mite extract; Other; and Pollen extract   Review of Systems Review of Systems All other systems are reviewed and are negative for acute change except as noted in the HPI   Physical Exam Updated Vital Signs BP 122/76 (BP Location: Left Arm)   Pulse 89   Temp 98 F (36.7 C) (Oral)   Resp 18   LMP 06/23/2017   SpO2 100%   Physical Exam  Constitutional: She is oriented to person, place, and time. She appears well-developed and well-nourished. No distress.  HENT:  Head: Normocephalic and atraumatic.  Nose: Nose normal.  Mouth/Throat: Mucous membranes are normal. No uvula swelling. No oropharyngeal exudate, posterior oropharyngeal edema or posterior oropharyngeal erythema. Tonsils are 1+ on the right. Tonsils are 1+ on the left. No tonsillar exudate.  Posterior oropharynx cobblestoning  Eyes: Pupils are equal, round, and reactive to light. Conjunctivae and EOM are normal. Right eye exhibits no discharge. Left eye exhibits no discharge. No scleral icterus.  Neck: Normal range of motion. Neck supple.  Cardiovascular: Normal rate and regular rhythm.  Exam reveals no gallop and no friction rub.   No murmur heard. Pulmonary/Chest: Effort normal and breath sounds normal. No stridor. No respiratory distress. She has no decreased breath  sounds. She has no wheezes. She has no rhonchi. She has no rales.  Abdominal: Soft. She exhibits no distension. There is no tenderness.  Musculoskeletal: She exhibits no edema or tenderness.  Neurological: She is alert and oriented to person, place, and time.  Skin: Skin is warm and dry. No rash noted. She is not diaphoretic. No erythema.  Psychiatric: She has a normal mood and affect.  Vitals reviewed.    ED Treatments / Results  Labs (all labs ordered are listed, but only abnormal results are displayed) Labs Reviewed - No data to display  EKG  EKG Interpretation None       Radiology No results found.  Procedures Procedures (including critical care time)  Medications Ordered in ED Medications - No data to display   Initial Impression / Assessment and Plan / ED Course  I have reviewed the triage vital signs and the nursing notes.  Pertinent labs & imaging results that were available during my care of the patient were reviewed by me and considered in my medical decision making (see chart for details).     No airway compromise, stridor, difficulty swallowing at this time. Symptoms have resolved. No indication for imaging at this time. Recommended treatment for acid reflux and seasonal allergies. Also recommended patient follow-up with GI and ENT.  The patient is safe for discharge with strict return precautions.   Final Clinical Impressions(s) / ED Diagnoses   Final diagnoses:  Food impaction of esophagus, initial encounter   Disposition: Discharge  Condition: Good  I have discussed the results, Dx and Tx plan with the patient who expressed understanding and agree(s) with the plan. Discharge instructions discussed at great length. The patient was given strict return precautions who verbalized understanding of the instructions. No further questions at time of discharge.    New Prescriptions   No medications on file    Follow Up: Lin Landsman, [place] [address] [phone]  Schedule an appointment as soon as possible for a visit    Ear nose and throat doctor  Schedule an appointment as soon as possible for a visit        Cardama, Grayce Sessions,  MD 09/03/17 1958

## 2017-10-20 ENCOUNTER — Ambulatory Visit (INDEPENDENT_AMBULATORY_CARE_PROVIDER_SITE_OTHER): Payer: Medicaid Other | Admitting: Internal Medicine

## 2017-10-20 ENCOUNTER — Encounter: Payer: Self-pay | Admitting: Internal Medicine

## 2017-10-20 ENCOUNTER — Ambulatory Visit (INDEPENDENT_AMBULATORY_CARE_PROVIDER_SITE_OTHER)
Admission: RE | Admit: 2017-10-20 | Discharge: 2017-10-20 | Disposition: A | Discharge status: Home / Self Care | Payer: Medicaid Other | Source: Ambulatory Visit | Attending: Internal Medicine | Admitting: Internal Medicine

## 2017-10-20 VITALS — BP 124/74 | HR 90 | Ht 69.0 in | Wt 202.0 lb

## 2017-10-20 DIAGNOSIS — R058 Other specified cough: Secondary | ICD-10-CM

## 2017-10-20 DIAGNOSIS — R05 Cough: Secondary | ICD-10-CM

## 2017-10-20 DIAGNOSIS — F17210 Nicotine dependence, cigarettes, uncomplicated: Secondary | ICD-10-CM | POA: Diagnosis not present

## 2017-10-20 NOTE — Progress Notes (Signed)
LMTCB

## 2017-10-20 NOTE — Progress Notes (Signed)
Subjective:    Patient ID: Raven [name], female    DOB: 08/01/1980   MRN: [id number]    Brief patient profile:  11  yobf  Variably smoking  never really healthy as child attributed to palpitations / allergies then started smoking age 36 by age 36 started having breathing problems worse p tonsilectomy in 2002  referred to pulmonary clinic by ER 08/08/2014    08/08/2014 1st [facility] office visit/ Jaelon Gatley  Chief Complaint  Patient presents with  . Pulmonary Consult    Self referral. Pt states that she has been having problems with aspirating food since tonsillectomy in 2002. She has been dxed with aspiration PNA x 2 already in 2015.  She c/o cough and SOB. Cough is esp worse after eating and at night. She states that she wakes up gasping for air.   not happy x 4-5 y daily difficulty with swallowing and when lie down immediately gasping for air  On acei chronically but denies taking it - says meds listed never correspond to what she actually was taking Has used saba, helps very little if at all  No better on qvar so stopped it  rec Pantoprazole 20 mg Take 30-60 min before first meal of the day and pepcid 20 mg at bedtime  GERD diet        09/05/2014 f/u ov/Kaliope Quinonez re: no meds on hand  Chief Complaint  Patient presents with  . Follow-up    Pt states she has not been coughing as much as when last here. No new co's today. She is using albuterol approx 3 times per wk.    Stopped pantoprazole due to abd cramping, taking pepcid at hs with less coughing then Not limited by breathing from desired activities   Mostly concerned with hoarsness/ already eval by [facility] and not willing to return - convinced she has lump in throat.  rec Take the pepcid 20 mg after bfast and after supper Please see patient coordinator before you leave today  to schedule ENT eval by [facility] Depomedrol 120 mg today  The key is to stop smoking completely before smoking completely stops you!       10/20/2017  Extended f/u ov/Carmino Ocain re: re -establish uacs / no meds on hand as req  Chief Complaint  Patient presents with  . Follow-up    Pt states she has had PNA x 2 since she was here last in 2015. Pt states that she is still having trouble swallowing and is having hoarseness.  She states also having alot of SOB mainly when eating and drinking. She is also coughing, esp after she eats.   cough is worst at hs every noct x 3 months  With freq vomiting (she thinks she's coughing up food she's aspirated on near nigthtly basis  Sob mainly when coughing/ also overt HB symptoms and not sleeping with bed blocks as rec Has not f/u at [facility], never saw Dr Joya Gaskins there.   No obvious patterns in day to day or daytime variability or assoc excess/ purulent sputum or mucus plugs or hemoptysis or cp or chest tightness, subjective wheeze or overt sinus   symptoms. No unusual exp hx or h/o childhood pna/ asthma or knowledge of premature birth.    Also denies any obvious fluctuation of symptoms with weather or environmental changes or other aggravating or alleviating factors except as outlined above   Current Allergies, Complete Past Medical History, Past Surgical History, Family History, and Social History were reviewed  in [employer] record.  ROS  The following are not active complaints unless bolded Hoarseness, sore throat, dysphagia, dental problems, itching, sneezing,  nasal congestion or discharge of excess mucus or purulent secretions, ear ache,   fever, chills, sweats, unintended wt loss or wt gain, classically pleuritic or exertional cp,  orthopnea pnd or leg swelling, presyncope, palpitations, abdominal pain, anorexia, nausea, vomiting, diarrhea  or change in bowel habits or change in bladder habits, change in stools or change in urine, dysuria, hematuria,  rash, arthralgias, visual complaints, headache, numbness, weakness or ataxia or problems with walking or coordination,   change in mood/affect or memory.        Current Meds  Medication Sig  . clonazePAM (KLONOPIN) 2 MG tablet Take 2 mg by mouth every 8 (eight) hours.  . metroNIDAZOLE (METROGEL VAGINAL) 0.75 % vaginal gel Place 1 Applicatorful vaginally 2 (two) times daily.  Marland Kitchen oxyCODONE-acetaminophen (PERCOCET) 10-325 MG tablet Take 1-2 tablets by mouth every 8 (eight) hours as needed for pain.   . pregabalin (LYRICA) 100 MG capsule Take 1 capsule (100 mg total) by mouth 2 (two) times daily.                       Objective:   Physical Exam   10/20/2017      202 09/05/2014          196     08/08/14 197 lb (89.359 kg)  05/10/14 195 lb (88.451 kg)  03/27/14 195 lb 4 oz (88.565 kg)      Hoarse amb bf nad with classic pseudowheeze     HEENT: nl dentition, turbinates, and orophanx. Nl external ear canals without cough reflex   NECK :  without JVD/Nodes/TM/ nl carotid upstrokes bilaterally   LUNGS: no acc muscle use, clear to A and P bilaterally without cough on insp or exp maneuvers   CV:  RRR  no s3 or murmur or increase in P2, no edema   ABD:  soft and nontender with nl excursion in the supine position. No bruits or organomegaly, bowel sounds nl  MS:  warm without deformities, calf tenderness, cyanosis or clubbing  SKIN: warm and dry without lesions    NEURO:  alert, approp, no deficits    CXR PA and Lateral:   10/20/2017 :    I personally reviewed images and agree with radiology impression as follows:      No active cardiopulmonary disease.      I personally reviewed images and agree with radiology impression as follows:  CT sinuses : 04/17/17 nl on ct head same date       Assessment & Plan:

## 2017-10-20 NOTE — Patient Instructions (Addendum)
Please see patient coordinator before you leave today  to schedule evaluation by Dr Carol Ada but take all active medications with you.  The key is to stop smoking completely before smoking completely stops you!    Please remember to go to the  x-ray department downstairs in the basement  for your tests - we will call you with the results when they are available.   GERD (REFLUX)  is an extremely common cause of respiratory symptoms just like yours , many times with no obvious heartburn at all.    It can be treated with medication, but also with lifestyle changes including elevation of the head of your bed (ideally with 6 inch  bed blocks),  Smoking cessation, avoidance of late meals, excessive alcohol, and avoid fatty foods, chocolate, peppermint, colas, red wine, and acidic juices such as orange juice.  NO MINT OR MENTHOL PRODUCTS SO NO COUGH DROPS  USE SUGARLESS CANDY INSTEAD (Jolley ranchers or Stover's or Life Savers) or even ice chips will also do - the key is to swallow to prevent all throat clearing. NO OIL BASED VITAMINS - use powdered substitutes.    Pulmonary follow up is as needed      .

## 2017-10-21 ENCOUNTER — Telehealth: Payer: Self-pay | Admitting: Internal Medicine

## 2017-10-21 NOTE — Telephone Encounter (Signed)
Notes recorded by Tanda Rockers, MD on 10/20/2017 at 5:13 PM EDT Call pt: Reviewed cxr and no acute change so no change in recommendations made at  Advised pt of results. Pt understood and nothing further is needed.

## 2017-10-23 ENCOUNTER — Encounter: Payer: Self-pay | Admitting: Internal Medicine

## 2017-10-23 NOTE — Assessment & Plan Note (Signed)
-  Spirometry wnl  08/08/14  - referred to [facility] 09/05/2014 > inconclusive w/u by ENT / GI s f/u ov by either since 2016 > referred  Back to Dr Joya Gaskins 10/20/2017   Not clear from Care Everywhere what the plan was to address her problem between GI and ENT and she it totally confused as to plan of care as well so rec she return there to see Dr Joya Gaskins and go over there w/u and develop a definitive plan.  Explained she does not appear to have a pulmonary problem and unlikely to be "coughing up food on a nightly basis" s more evidence of asp injury to the lung than is apparent.  Pulmonary f/u is prn   I had an extended discussion with the patient reviewing all relevant studies completed to date (including extensive review of studies at [facility] but unable to find the f/u ov's or discussion of results with pt)  and  lasting 25 minutes of a 40  minute office visit to re-establissh     re  severe non-specific but potentially very serious refractory respiratory symptoms of uncertain and potentially multiple  etiologies.   Each maintenance medication was reviewed in detail including most importantly the difference between maintenance and prns and under what circumstances the prns are to be triggered using an action plan format that is not reflected in the computer generated alphabetically organized AVS.    Please see AVS for specific instructions unique to this office visit that I personally wrote and verbalized to the the pt in detail and then reviewed with pt  by my nurse highlighting any changes in therapy/plan of care  recommended at today's visit.

## 2017-10-23 NOTE — Assessment & Plan Note (Signed)

## 2017-10-23 NOTE — Progress Notes (Signed)
Spoke with pt and notified of results per Dr. Wert. Pt verbalized understanding and denied any questions. 

## 2017-12-17 ENCOUNTER — Ambulatory Visit (INDEPENDENT_AMBULATORY_CARE_PROVIDER_SITE_OTHER): Payer: Medicaid Other | Admitting: Orthopaedic Surgery

## 2017-12-17 ENCOUNTER — Encounter (INDEPENDENT_AMBULATORY_CARE_PROVIDER_SITE_OTHER): Payer: Self-pay | Admitting: Orthopaedic Surgery

## 2017-12-17 DIAGNOSIS — M79641 Pain in right hand: Secondary | ICD-10-CM | POA: Diagnosis not present

## 2017-12-17 DIAGNOSIS — M25561 Pain in right knee: Secondary | ICD-10-CM | POA: Diagnosis not present

## 2017-12-17 DIAGNOSIS — M79642 Pain in left hand: Secondary | ICD-10-CM | POA: Diagnosis not present

## 2017-12-17 DIAGNOSIS — G8929 Other chronic pain: Secondary | ICD-10-CM

## 2017-12-17 DIAGNOSIS — M25511 Pain in right shoulder: Secondary | ICD-10-CM | POA: Diagnosis not present

## 2017-12-17 DIAGNOSIS — M25512 Pain in left shoulder: Secondary | ICD-10-CM

## 2017-12-17 DIAGNOSIS — M25562 Pain in left knee: Secondary | ICD-10-CM

## 2017-12-17 DIAGNOSIS — M25552 Pain in left hip: Secondary | ICD-10-CM | POA: Diagnosis not present

## 2017-12-17 DIAGNOSIS — M25551 Pain in right hip: Secondary | ICD-10-CM | POA: Diagnosis not present

## 2017-12-17 NOTE — Progress Notes (Signed)
Office Visit Note   Patient: Raven [name]           Date of Birth: 03-May-1981           MRN: [id number] Visit Date: 12/17/2017              Requested by: Lin Landsman, [address] [place], [place] [address] PCP: Lin Landsman, MD   Assessment & Plan: Visit Diagnoses:  1. Bilateral hip pain   2. Chronic pain of both shoulders   3. Chronic pain of both knees   4. Pain in both hands     Plan: Overall impression is multiple joint arthralgia with nonspecific findings.  Arthritis panel obtained.  We had a thorough discussion regarding each of her joint complaints.  I think this is more along the lines of chronic pain syndrome or fibromyalgia.  Questions encouraged and answered.  Follow-up as needed.  Follow-Up Instructions: Return if symptoms worsen or fail to improve.   Orders:  Orders Placed This Encounter  Procedures  . Uric acid  . Antinuclear Antib (ANA)  . Sed Rate (ESR)  . Rheumatoid Factor  . Ambulatory referral to Rheumatology   No orders of the defined types were placed in this encounter.     Procedures: No procedures performed   Clinical Data: No additional findings.   Subjective: Chief Complaint  Patient presents with  . Right Hip - Pain  . Left Hip - Pain  . Right Hand - Pain  . Left Hand - Pain  . Right Ankle - Pain  . Left Ankle - Pain  . Right Knee - Pain  . Left Knee - Pain  . Right Shoulder - Pain  . Left Shoulder - Pain    Patient is a 36 year old female who comes in complaining of symmetric large joint pain for years.  She has a family history of rheumatoid arthritis.  She has pain throughout the day and night.  She has worsening symptoms with prolonged standing and activity.  She is endorsing muscle spasms.  Denies any injuries.    Review of Systems  Constitutional: Negative.   HENT: Negative.   Eyes: Negative.   Respiratory: Negative.   Cardiovascular: Negative.   Endocrine: Negative.   Musculoskeletal: Negative.     Neurological: Negative.   Hematological: Negative.   Psychiatric/Behavioral: Negative.   All other systems reviewed and are negative.    Objective: Vital Signs: LMP 06/23/2017   Physical Exam  Constitutional: She is oriented to person, place, and time. She appears well-developed and well-nourished.  HENT:  Head: Normocephalic and atraumatic.  Eyes: EOM are normal.  Neck: Neck supple.  Pulmonary/Chest: Effort normal.  Abdominal: Soft.  Neurological: She is alert and oriented to person, place, and time.  Skin: Skin is warm. Capillary refill takes less than 2 seconds.  Psychiatric: She has a normal mood and affect. Her behavior is normal. Judgment and thought content normal.  Nursing note and vitals reviewed.   Ortho Exam Exam of her large joints do not show any skin changes or swelling or redness or effusion.  She has generalized tenderness with palpation throughout.  I do not appreciate any mechanical findings. Specialty Comments:  No specialty comments available.  Imaging: No results found.   PMFS History: Patient Active Problem List   Diagnosis Date Noted  . Occipital neuralgia of right side 04/21/2017  . Fibromyalgia 04/21/2017  . UTI (urinary tract infection) 02/12/2017  . Gallstones and inflammation of gallbladder without obstruction  01/23/2015  . S/P laparoscopic surgery 01/23/2015  . Upper airway cough syndrome 09/05/2014  . Cigarette smoker 08/09/2014  . Dyspnea 08/08/2014  . Abdominal pain, left lower quadrant 03/27/2014  . Cardiomyopathy, idiopathic ([place]) 01/09/2014  . History of chlamydia 01/09/2013  . 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  . History of abnormal Pap smear 01/09/2013  . Achalasia   . Abnormal Pap smear   . Depression   . HSV-2 infection   . Heart palpitations   . Heart murmur   . Anxiety   . GERD (gastroesophageal reflux disease)   . 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 PVC's (premature ventricular contractions) 05/19/2011  .  Palpitation 04/11/2011  . ESOPHAGEAL STRICTURE 08/10/2009  . DYSPHAGIA 08/10/2009  . Esophageal reflux 11/30/2008  . Unspecified constipation 11/30/2008  . DEPRESSION 10/09/2008  . ASTHMA 10/09/2008   Past Medical History:  Diagnosis Date  . Abnormal Pap smear   . Achalasia    esphog stretched several times  . Anxiety   . Arthritis   . Asthma    rarely uses inhaler  . Blood transfusion 01/2006   [place]  . Cardiomyopathy ([place])   . Constipation due to opioid therapy   . Depression   . Dysrhythmia   . Fibromyalgia   . GERD (gastroesophageal reflux disease)    no meds - diet controlled  . Heart murmur   . Hiatal hernia   . History of abnormal Pap smear   . History of chlamydia 1990's  . HSV-2 infection   . HTN (hypertension)   . Occipital neuralgia of right side 04/21/2017  . Pneumonia 05/2013  . PVC's (premature ventricular contractions)   . Shortness of breath dyspnea   . Status post dilation of esophageal narrowing   . Vaginal Pap smear, abnormal     Family History  Problem Relation Age of Onset  . Heart disease Father   . Hypertension Father   . Stroke Mother   . Hypertension Mother   . Heart attack Mother   . Breast cancer Mother   . Neuropathy Mother   . Arthritis Mother   . Asthma Mother   . Neuropathy Sister   . Arthritis Sister   . Asthma Sister     Past Surgical History:  Procedure Laterality Date  . ADENOIDECTOMY  12/2002  . BLADDER SUSPENSION  06/11/2012   Procedure: TRANSVAGINAL TAPE (TVT) PROCEDURE;  Surgeon: Delice Lesch, MD;  Location: [facility];  Service: Gynecology;  Laterality: N/A;  Tension Free Vaginal Tape/cystoscopy  . BREAST SURGERY  07/2001   tumor removed right breast  . CHOLECYSTECTOMY N/A 01/23/2015   Procedure: LAPAROSCOPIC CHOLECYSTECTOMY ;  Surgeon: Fanny Skates, MD;  Location: [facility];  Service: General;  Laterality: N/A;  case booked with cholangiogram but MD did not complete intraoperative cholangiogram   . CYSTOSCOPY  06/11/2012    Procedure: CYSTOSCOPY;  Surgeon: Delice Lesch, MD;  Location: [facility];  Service: Gynecology;  Laterality: N/A;  . DILATION AND CURETTAGE OF UTERUS  1999  . DILATION AND CURETTAGE OF UTERUS  01/2006   retained products  . ESOPHAGOGASTRODUODENOSCOPY     with esophageal dilation x 3  . FOREIGN BODY REMOVAL  06/11/2012   Procedure: REMOVAL FOREIGN BODY EXTREMITY;  Surgeon: Delice Lesch, MD;  Location: [facility];  Service: Gynecology;  Laterality: Right;  Removal of Implanon   . LAPAROSCOPIC TUBAL LIGATION  06/11/2012   Procedure: LAPAROSCOPIC TUBAL LIGATION;  Surgeon: Delice Lesch, MD;  Location: [facility];  Service: Gynecology;  Laterality: Bilateral;  Laparoscopic Bilateral Fulgeration  . TONSILLECTOMY    . TUBAL LIGATION     Social History   Occupational History  . Not on file  Tobacco Use  . Smoking status: Current Some Day Smoker    Packs/day: 0.25    Years: 15.00    Pack years: 3.75    Types: Cigarettes  . Smokeless tobacco: Never Used  . Tobacco comment: HAS CUT BACK  Substance and Sexual Activity  . Alcohol use: No    Comment: on occasion  . Drug use: No  . Sexual activity: Yes    Birth control/protection: Surgical

## 2017-12-18 ENCOUNTER — Telehealth (INDEPENDENT_AMBULATORY_CARE_PROVIDER_SITE_OTHER): Payer: Self-pay | Admitting: Orthopaedic Surgery

## 2017-12-18 LAB — RHEUMATOID FACTOR

## 2017-12-18 LAB — URIC ACID: URIC ACID, SERUM: 6.9 mg/dL (ref 2.5–7.0)

## 2017-12-18 LAB — ANA: Anti Nuclear Antibody(ANA): NEGATIVE

## 2017-12-18 LAB — SEDIMENTATION RATE: SED RATE: 6 mm/h (ref 0–20)

## 2017-12-18 NOTE — Telephone Encounter (Signed)
Patient called stating that she has some questions about her lab results.  CB#[phone].  Thank you.

## 2017-12-18 NOTE — Progress Notes (Signed)
All normal.  Should follow up with PCP

## 2017-12-18 NOTE — Telephone Encounter (Signed)
IC discussed with patient.

## 2018-01-18 ENCOUNTER — Telehealth (INDEPENDENT_AMBULATORY_CARE_PROVIDER_SITE_OTHER): Payer: Self-pay | Admitting: Orthopaedic Surgery

## 2018-01-18 ENCOUNTER — Ambulatory Visit: Payer: Medicaid Other | Admitting: Neurology

## 2018-01-18 ENCOUNTER — Encounter: Payer: Self-pay | Admitting: Neurology

## 2018-01-18 VITALS — BP 126/88 | HR 87 | Ht 69.0 in | Wt 203.0 lb

## 2018-01-18 DIAGNOSIS — F513 Sleepwalking [somnambulism]: Secondary | ICD-10-CM | POA: Diagnosis not present

## 2018-01-18 DIAGNOSIS — I429 Cardiomyopathy, unspecified: Secondary | ICD-10-CM | POA: Diagnosis not present

## 2018-01-18 DIAGNOSIS — G4719 Other hypersomnia: Secondary | ICD-10-CM | POA: Diagnosis not present

## 2018-01-18 DIAGNOSIS — Z79899 Other long term (current) drug therapy: Secondary | ICD-10-CM

## 2018-01-18 DIAGNOSIS — F5109 Other insomnia not due to a substance or known physiological condition: Secondary | ICD-10-CM | POA: Diagnosis not present

## 2018-01-18 DIAGNOSIS — Z79891 Long term (current) use of opiate analgesic: Secondary | ICD-10-CM | POA: Insufficient documentation

## 2018-01-18 DIAGNOSIS — G4726 Circadian rhythm sleep disorder, shift work type: Secondary | ICD-10-CM | POA: Diagnosis not present

## 2018-01-18 DIAGNOSIS — R6889 Other general symptoms and signs: Secondary | ICD-10-CM

## 2018-01-18 DIAGNOSIS — R0683 Snoring: Secondary | ICD-10-CM

## 2018-01-18 DIAGNOSIS — G4759 Other parasomnia: Secondary | ICD-10-CM | POA: Insufficient documentation

## 2018-01-18 DIAGNOSIS — G4752 REM sleep behavior disorder: Secondary | ICD-10-CM

## 2018-01-18 DIAGNOSIS — F519 Sleep disorder not due to a substance or known physiological condition, unspecified: Secondary | ICD-10-CM

## 2018-01-18 HISTORY — DX: Other hypersomnia: G47.19

## 2018-01-18 HISTORY — DX: Long term (current) use of opiate analgesic: Z79.891

## 2018-01-18 HISTORY — DX: Circadian rhythm sleep disorder, shift work type: G47.26

## 2018-01-18 HISTORY — DX: Other parasomnia: G47.59

## 2018-01-18 HISTORY — DX: Sleepwalking (somnambulism): F51.3

## 2018-01-18 HISTORY — DX: Other long term (current) drug therapy: Z79.899

## 2018-01-18 HISTORY — DX: Other general symptoms and signs: R68.89

## 2018-01-18 NOTE — Telephone Encounter (Signed)
Can we know the reason for denying to see patient? Thanks.

## 2018-01-18 NOTE — Telephone Encounter (Signed)
ok 

## 2018-01-18 NOTE — Progress Notes (Signed)
SLEEP MEDICINE CLINIC   Provider:  Larey Seat, M D  Primary Care Physician:  Lin Landsman, MD   Referring Provider: Dr Jannifer Franklin   Chief Complaint  Patient presents with  . New Patient (Initial Visit)    pt with daughter, rm 18. pt states that she has been struggling wth snoring and gasping for air and stops breathing. she has been having nightmares and sleep walking. pt complains of daytime sleepiness and feelling drained.     HPI:  Raven [name] is a 37 y.o.afro - [place]  female , left handed,  And seen here in a referral  from Dr. Jannifer Franklin  for a sleep medicine evaluation.  Chief complaint according to patient : Raven [name] reports that her sleep has deteriorated and has affected her relationships.  She has been told that she snores loudly, but also that she sleep walks and sleep talks, and apparently jerks and has sudden abrupt movements in her sleep which wake her partner.  Also feels that during daytime she has sudden attacks of sleepiness with sudden profound fatigue, and that she has not been sleep deprived. She sleeps a lot. Her fiancee moved to the couch.  She has sleep choking, gagging and has seen a pulmonary specialist who could not find a pulmonary problem , she has been diagnosed with a swallowing disorder, dysphonia, sees ENT and GI - had multiple esophageal  " stretching" procedures .  She carries a diagnosis of achalasia. She sees a cardiologist for premature ventricular contractions, hypertension, and carries a diagnosis of cardiomyopathy which has not been further qualified in her epic chart.  He has seen Dr. Virl Axe for LVH with EF of 40% ( per patient's report).  The patient saw Dr. Jannifer Franklin in April 2018 after a 3-year history of discomfort which has affected multiple body parts.  She reported sharp pains in the left wrist and numbness sensations that rise up her left arm, sharp pain in legs and burning sensation in the feet and arms.  She also had sharp jabbing  pains in the right occipital area which have increased in frequency since 2015.  Sometimes she has several events a day.  Dr. 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  Had been placed by primary care on Lyrica 75 mg twice a day for fibromyalgia and Dr. Jannifer Franklin increase the dose 200 mg twice a day.  Medicaid has not covered her Lyrica recently and she has returned to opiate pain medication with constipation, depression, mood changes.  She is also prescribed Klonopin 2 mg every 8 hours for anxiety, she is currently also on Soma up to 3 times a day as needed, (also was not covered by Medicaid I learned).  She also has a note of night sweats.  Whole body pain, while body spasms. Has anxiety.  She is on Percocet right now. Referred to rheumatology by orthopedist.  Sleep habits are as follows: The patient works from 7 AM to 7 PM and goes to bed between 8 and 9:00.  She is a very restless sleeper and has started taking hydroxyzine to help her sleep.  She reports being afraid of the dark, she has the TV on in the bedroom.  She has a lot of vivid dreams- and when going to the be athrroom she goes back to sleep and continues the same dream- nightmarish, being chased, being surrounded by fire or rodents. She will have one bathroom break around 2 in the morning  and 1 at 5 AM.  She rarely has more bathroom breaks.  She has a bedroom with her fianc.  He tends to sweat a lot at night as well. She wakes up spontaneously. She takes naps in daytime when not a work.    Sleep medical history and family sleep history:  Daughter is sleep walking( current age 81 ).patient has had  " a nervous breakdown " in Nov 2018  Patient had night terrors in childhood and screaming in her sleep now- as adult .  She underwent tonsillectomy in 2000. Older daughter snores.    Social history: lives with her fiancee of 5 years,  2 daughters,  87 and 1 .  Father deceased.   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   smoker- 1ppd, ETOH ; seldomly , caffeine: soda once a day, hot tea 2 cups a day- no energy drinks. Chocolate: none.   Review of Systems: Out of a complete 14 system review, the patient complains of only the following symptoms, and all other reviewed systems are negative.  snoring, gasping, sleep walking, sleep yelling, talking, kicking.   Epworth score 16 , Fatigue severity score 54  , depression score N/ A please review psychiatric record.    Social History   Socioeconomic History  . Marital status: Single    Spouse name: Not on file  . Number of children: 2  . Years of education: College  . Highest education level: Not on file  Social Needs  . Financial resource strain: Not on file  . Food insecurity - worry: Not on file  . Food insecurity - inability: Not on file  . Transportation needs - medical: Not on file  . Transportation needs - non-medical: Not on file  Occupational History  . Not on file  Tobacco Use  . Smoking status: Current Some Day Smoker    Packs/day: 0.25    Years: 15.00    Pack years: 3.75    Types: Cigarettes  . Smokeless tobacco: Never Used  . Tobacco comment: HAS CUT BACK  Substance and Sexual Activity  . Alcohol use: No    Comment: on occasion  . Drug use: No  . Sexual activity: Yes    Birth control/protection: Surgical  Other Topics Concern  . Not on file  Social History Narrative   Lives with fiance and children   Caffeine use:  Hot ginger tea- daily   Left-handed    Family History  Problem Relation Age of Onset  . Heart disease Father   . Hypertension Father   . Stroke Mother   . Hypertension Mother   . Heart attack Mother   . Breast cancer Mother   . Neuropathy Mother   . Arthritis Mother   . Asthma Mother   . Neuropathy Sister   . Arthritis Sister   . Asthma Sister     Past Medical History:  Diagnosis Date  . Abnormal Pap smear   .  Achalasia    esphog stretched several times  . Anxiety   . Arthritis   . Asthma    rarely uses inhaler  . Blood transfusion 01/2006   [place]  . Cardiomyopathy ([place])   . Constipation due to opioid therapy   . Depression   . Dysrhythmia   . Fibromyalgia   . GERD (gastroesophageal reflux disease)    no meds - diet controlled  .  Heart murmur   . Hiatal hernia   . History of abnormal Pap smear   . History of chlamydia 1990's  . HSV-2 infection   . HTN (hypertension)   . Occipital neuralgia of right side 04/21/2017  . Pneumonia 05/2013  . PVC's (premature ventricular contractions)   . Shortness of breath dyspnea   . Status post dilation of esophageal narrowing   . Vaginal Pap smear, abnormal     Past Surgical History:  Procedure Laterality Date  . ADENOIDECTOMY  12/2002  . BLADDER SUSPENSION  06/11/2012   Procedure: TRANSVAGINAL TAPE (TVT) PROCEDURE;  Surgeon: Delice Lesch, MD;  Location: [facility];  Service: Gynecology;  Laterality: N/A;  Tension Free Vaginal Tape/cystoscopy  . BREAST SURGERY  07/2001   tumor removed right breast  . CHOLECYSTECTOMY N/A 01/23/2015   Procedure: LAPAROSCOPIC CHOLECYSTECTOMY ;  Surgeon: Fanny Skates, MD;  Location: [facility];  Service: General;  Laterality: N/A;  case booked with cholangiogram but MD did not complete intraoperative cholangiogram   . CYSTOSCOPY  06/11/2012   Procedure: CYSTOSCOPY;  Surgeon: Delice Lesch, MD;  Location: [facility];  Service: Gynecology;  Laterality: N/A;  . DILATION AND CURETTAGE OF UTERUS  1999  . DILATION AND CURETTAGE OF UTERUS  01/2006   retained products  . ESOPHAGOGASTRODUODENOSCOPY     with esophageal dilation x 3  . FOREIGN BODY REMOVAL  06/11/2012   Procedure: REMOVAL FOREIGN BODY EXTREMITY;  Surgeon: Delice Lesch, MD;  Location: [facility];  Service: Gynecology;  Laterality: Right;  Removal of Implanon   . LAPAROSCOPIC TUBAL LIGATION  06/11/2012   Procedure: LAPAROSCOPIC TUBAL LIGATION;  Surgeon: Delice Lesch, MD;   Location: [facility];  Service: Gynecology;  Laterality: Bilateral;  Laparoscopic Bilateral Fulgeration  . TONSILLECTOMY    . TUBAL LIGATION      Current Outpatient Medications  Medication Sig Dispense Refill  . clonazePAM (KLONOPIN) 2 MG tablet Take 2 mg by mouth every 8 (eight) hours.  0  . FLUoxetine (PROZAC) 20 MG/5ML solution TK 2.5 ML PO QAM UTD  3  . hydrOXYzine (VISTARIL) 25 MG capsule TK ONE C PO HS PRA  2  . LATUDA 20 MG TABS tablet TK 1 T PO QPM WITH A MEAL.  3  . metroNIDAZOLE (METROGEL VAGINAL) 0.75 % vaginal gel Place 1 Applicatorful vaginally 2 (two) times daily. 70 g 0  . oxyCODONE-acetaminophen (PERCOCET) 10-325 MG tablet Take 1-2 tablets by mouth every 8 (eight) hours as needed for pain.   0  . pregabalin (LYRICA) 100 MG capsule Take 1 capsule (100 mg total) by mouth 2 (two) times daily. 60 capsule 3   No current facility-administered medications for this visit.     Allergies as of 01/18/2018 - Review Complete 01/18/2018  Allergen Reaction Noted  . Dust mite extract Shortness Of Breath 01/28/2015  . Other Shortness Of Breath 01/19/2015  . Pollen extract Shortness Of Breath 01/28/2015    Vitals: BP 126/88   Pulse 87   Ht 5\' 9"  (1.753 m)   Wt 203 lb (92.1 kg)   LMP 06/23/2017   BMI 29.98 kg/m  Last Weight:  Wt Readings from Last 1 Encounters:  01/18/18 203 lb (92.1 kg)   ZOX:WRUE mass index is 29.98 kg/m.     Last Height:   Ht Readings from Last 1 Encounters:  01/18/18 5\' 9"  (1.753 m)    Physical exam:  General: The patient is awake, alert and appears not in acute distress. The patient  is well groomed. Head: Normocephalic, atraumatic. Neck is supple. Mallampati 5-mucous lining is not reddened, there is no evidence of swelling from acid reflux but the uvula is not visibly elevated.  neck circumference:14. 75 . Nasal airflow patent,  Retrognathia is not seen. She has partial teeth prosthesis.  Cardiovascular:  Regular rate and rhythm, without  murmurs or  carotid bruit, and without distended neck veins. Respiratory: Lungs are clear to auscultation. Skin:  Without evidence of edema, or rash Trunk: below 30 . The patient's posture is erect  Neurologic exam : The patient is awake and alert, oriented to place and time. She is logorrhoeic.    Memory subjective  described as impaired . Attention span & concentration ability appear limited .Speech is fluent,  with dysphonia .    Cranial nerves:  taste and smell - intact . Pupils are equal and briskly reactive to light. Funduscopic exam without  evidence of pallor or edema. Extraocular movements  in vertical and horizontal planes intact and without nystagmus. Visual fields by finger perimetry are intact. Hearing to finger rub intact.  Facial sensation intact to fine touch. Facial motor strength is symmetric and tongue and uvula move midline. Shoulder shrug was symmetrical.   Motor exam: Normal tone, muscle bulk and symmetric strength in all extremities. Grip strength intact. Sensory:  Fine touch, pinprick and vibration were tested in all extremities. Proprioception tested in the upper extremities was normal. Coordination: Rapid alternating movements /Finger-to-nose maneuver  normal without evidence of ataxia, dysmetria or tremor. Gait and station: Patient walks without assistive device. Deep tendon reflexes: in the  upper and lower extremities are symmetric and intact.    Assessment:  After physical and neurologic examination, review of laboratory studies,  Personal review of imaging studies, reports of other /same  Imaging studies, results of polysomnography and / or neurophysiology testing and pre-existing records as far as provided in visit., my assessment is   1) Mrs. Gilkey has never undergone a sleep test before but she has a multitude of comorbidities, some ill-defined and undiagnosed to this point.  Her fianc and her family have noted that she snores, that she gasps for air sometimes feeling  choking.  This may be explained by her high-grade Mallampati a very narrow upper airway.  I will definitely have to test this patient for sleep apnea  2) positional also presents with parasomnia activity, sleepwalking, sleep talking, yelling and she reports vivid dreams fragmenting her sleep.  Once she wakes from 1 of her night terrors or nightmares and goes back to sleep the same dream continues.  This may be induced by medication, but she also takes medication that classically would treat parasomnia activity such as benzodiazepines, pregabalin, but had to replace some of her nonnarcotic pain medications with narcotics due to Medicaid not covering her drugs.  3) there is also an extensive history of anxiety, phobias, being afraid of the dark and in November 2018 she reported being hospitalized after a nervous breakdown. She is followed by mental health, I do not have access to her psychiatric records. She described chronic insomnia, only improved since taking hydroxizine.    The patient was advised of the nature of the diagnosed disorder , the treatment options and the  risks for general health and wellness arising from not treating the condition.   I spent more than 50 minutes of face to face time with the patient. I compared my notes to those of Dr. Jannifer Franklin from last year. I reviewed her medication -  Greater than 50% of time was spent in counseling and coordination of care. We have discussed the diagnosis and differential and I answered the patient's questions.    Plan:  Treatment plan and additional workup :   I will evaluate the patient for organic sleep disorder, this does include the workup for parasomnias, apnea, and periodic limb movement disorder.  Insomnia is usually followed by her behavioral health or cognitive behavior therapist.. I will not address the " fibromyalgia "related concerns of discomfort, pain or dysesthesias. I will not fill medication for pain or insomnia control.    Parasomnia montage PSG with video and audio. If apnea is found will address from there.    Larey Seat, MD 01/08/7355, 7:01 AM  Certified in Neurology by ABPN Certified in [place] by [facility] [address] [place], [place] [address]

## 2018-01-18 NOTE — Telephone Encounter (Signed)
Patient called asking about her referral that was denied for Dr. Estanislado Pandy, wondering what her next steps should be. CB # [phone]

## 2018-01-18 NOTE — Telephone Encounter (Signed)
See notes below, please advise

## 2018-01-18 NOTE — Telephone Encounter (Signed)
Per Dr. Estanislado Pandy all rheumatological labs drawn are normal.

## 2018-01-19 NOTE — Telephone Encounter (Signed)
I changed referral to send to Dr Amil Amen instead.

## 2018-02-09 ENCOUNTER — Ambulatory Visit (INDEPENDENT_AMBULATORY_CARE_PROVIDER_SITE_OTHER): Payer: Medicaid Other | Admitting: Neurology

## 2018-02-09 DIAGNOSIS — F513 Sleepwalking [somnambulism]: Secondary | ICD-10-CM

## 2018-02-09 DIAGNOSIS — G4759 Other parasomnia: Secondary | ICD-10-CM

## 2018-02-09 DIAGNOSIS — G4719 Other hypersomnia: Secondary | ICD-10-CM

## 2018-02-09 DIAGNOSIS — I429 Cardiomyopathy, unspecified: Secondary | ICD-10-CM

## 2018-02-09 DIAGNOSIS — Z79899 Other long term (current) drug therapy: Secondary | ICD-10-CM

## 2018-02-09 DIAGNOSIS — G4733 Obstructive sleep apnea (adult) (pediatric): Secondary | ICD-10-CM

## 2018-02-09 DIAGNOSIS — G4752 REM sleep behavior disorder: Secondary | ICD-10-CM

## 2018-02-09 DIAGNOSIS — F5109 Other insomnia not due to a substance or known physiological condition: Secondary | ICD-10-CM

## 2018-02-09 DIAGNOSIS — R0683 Snoring: Secondary | ICD-10-CM

## 2018-02-09 DIAGNOSIS — G4726 Circadian rhythm sleep disorder, shift work type: Secondary | ICD-10-CM

## 2018-02-09 DIAGNOSIS — F519 Sleep disorder not due to a substance or known physiological condition, unspecified: Secondary | ICD-10-CM

## 2018-02-09 DIAGNOSIS — R6889 Other general symptoms and signs: Secondary | ICD-10-CM

## 2018-02-09 DIAGNOSIS — Z79891 Long term (current) use of opiate analgesic: Secondary | ICD-10-CM

## 2018-02-15 NOTE — Procedures (Signed)
Normal Sleep EEG- normal polysomnography- no apnea, no PLMs and no oxygen desaturation but loud snoring.   No intervention needed- the patiet can see ENT for snoring or pursue a dental device ( which is not covered by insurance if apnea is not present) .  Weight loss and avoiding to sleep supine can help. No follow up in sleep clinic needed. CD

## 2018-02-16 ENCOUNTER — Telehealth: Payer: Self-pay | Admitting: Neurology

## 2018-02-16 NOTE — Telephone Encounter (Signed)
Called patient to discuss sleep study results. No answer at this time. LVM for the patient to call back.   

## 2018-02-17 ENCOUNTER — Other Ambulatory Visit: Payer: Self-pay | Admitting: Neurology

## 2018-02-17 ENCOUNTER — Telehealth (INDEPENDENT_AMBULATORY_CARE_PROVIDER_SITE_OTHER): Payer: Self-pay | Admitting: Orthopaedic Surgery

## 2018-02-17 DIAGNOSIS — R0683 Snoring: Secondary | ICD-10-CM

## 2018-02-17 NOTE — Telephone Encounter (Signed)
Do you know anything about this? 

## 2018-02-17 NOTE — Telephone Encounter (Signed)
Looks like on 01/19/18 Abigail Butts sent referral to: Estanislado Pandy declined due to all rheum labs normal.  Changed referral to Dr Amil Amen, [facility], to see if they will see patient.   I have not heard anything about an appointment yet!

## 2018-02-17 NOTE — Telephone Encounter (Signed)
Called patient and discussed the sleep study results, the patient is just confused by this test. She states that she moves at home a lot and she doesn't understand why this test didn't show that. Pt is requesting a follow up. I have placed a follow up apt to discuss further. The patient is also wanting a referral for a dentist office to look into the dental device route to help with her snoring. I have placed this order for the patient. Pt verbalized understanding.

## 2018-02-17 NOTE — Telephone Encounter (Signed)
Called patient I advised on message below. Gave them Dr Amil Amen, [facility] number but I advised her it takes time to look over her info. They will call her once its Approved/Denied.

## 2018-02-17 NOTE — Telephone Encounter (Signed)
Patient was referred to Dr. Patrecia Pour at first for rheumatology but was declined due to all labs being normal, then it looks like the referral was changed to Dr. Amil Amen. Patient still hasn't heard back and I didn't see any updates in the notes of the referral. Patient really wants to get a second opinion from a rheumatologist. Could you call patient back and let her know whats going on? Thanks # [phone]

## 2018-03-08 ENCOUNTER — Ambulatory Visit: Payer: Self-pay | Admitting: Neurology

## 2018-04-05 DIAGNOSIS — Z0271 Encounter for disability determination: Secondary | ICD-10-CM

## 2018-04-27 ENCOUNTER — Telehealth: Payer: Self-pay | Admitting: Neurology

## 2018-04-27 ENCOUNTER — Ambulatory Visit: Payer: Self-pay | Admitting: Neurology

## 2018-04-27 ENCOUNTER — Encounter: Payer: Self-pay | Admitting: Neurology

## 2018-04-27 NOTE — Telephone Encounter (Signed)
Pt no showed to apt today 

## 2018-08-31 ENCOUNTER — Telehealth (INDEPENDENT_AMBULATORY_CARE_PROVIDER_SITE_OTHER): Payer: Self-pay | Admitting: Orthopaedic Surgery

## 2018-08-31 NOTE — Telephone Encounter (Signed)
Returned call to patient left message to call back. 

## 2018-09-03 ENCOUNTER — Other Ambulatory Visit (INDEPENDENT_AMBULATORY_CARE_PROVIDER_SITE_OTHER): Payer: Medicaid Other

## 2018-09-03 ENCOUNTER — Encounter: Payer: Self-pay | Admitting: Gastroenterology

## 2018-09-03 ENCOUNTER — Ambulatory Visit (INDEPENDENT_AMBULATORY_CARE_PROVIDER_SITE_OTHER): Payer: Medicaid Other | Admitting: Gastroenterology

## 2018-09-03 VITALS — BP 126/82 | HR 100 | Ht 68.5 in | Wt 218.0 lb

## 2018-09-03 DIAGNOSIS — R1310 Dysphagia, unspecified: Secondary | ICD-10-CM | POA: Diagnosis not present

## 2018-09-03 DIAGNOSIS — R109 Unspecified abdominal pain: Secondary | ICD-10-CM | POA: Diagnosis not present

## 2018-09-03 DIAGNOSIS — K219 Gastro-esophageal reflux disease without esophagitis: Secondary | ICD-10-CM

## 2018-09-03 DIAGNOSIS — K5900 Constipation, unspecified: Secondary | ICD-10-CM | POA: Diagnosis not present

## 2018-09-03 DIAGNOSIS — R140 Abdominal distension (gaseous): Secondary | ICD-10-CM

## 2018-09-03 LAB — CBC WITH DIFFERENTIAL/PLATELET
BASOS PCT: 0.5 % (ref 0.0–3.0)
Basophils Absolute: 0 10*3/uL (ref 0.0–0.1)
EOS PCT: 0.8 % (ref 0.0–5.0)
Eosinophils Absolute: 0.1 10*3/uL (ref 0.0–0.7)
HCT: 39.8 % (ref 36.0–46.0)
HEMOGLOBIN: 13.6 g/dL (ref 12.0–15.0)
LYMPHS ABS: 2.5 10*3/uL (ref 0.7–4.0)
LYMPHS PCT: 28.4 % (ref 12.0–46.0)
MCHC: 34.1 g/dL (ref 30.0–36.0)
MCV: 89.7 fl (ref 78.0–100.0)
MONOS PCT: 6.6 % (ref 3.0–12.0)
Monocytes Absolute: 0.6 10*3/uL (ref 0.1–1.0)
NEUTROS ABS: 5.5 10*3/uL (ref 1.4–7.7)
NEUTROS PCT: 63.7 % (ref 43.0–77.0)
Platelets: 199 10*3/uL (ref 150.0–400.0)
RBC: 4.44 Mil/uL (ref 3.87–5.11)
RDW: 13.4 % (ref 11.5–15.5)
WBC: 8.7 10*3/uL (ref 4.0–10.5)

## 2018-09-03 LAB — TSH: TSH: 1.06 u[IU]/mL (ref 0.35–4.50)

## 2018-09-03 LAB — COMPREHENSIVE METABOLIC PANEL
ALT: 21 U/L (ref 0–35)
AST: 17 U/L (ref 0–37)
Albumin: 4.4 g/dL (ref 3.5–5.2)
Alkaline Phosphatase: 58 U/L (ref 39–117)
BILIRUBIN TOTAL: 0.6 mg/dL (ref 0.2–1.2)
BUN: 11 mg/dL (ref 6–23)
CALCIUM: 9.4 mg/dL (ref 8.4–10.5)
CHLORIDE: 108 meq/L (ref 96–112)
CO2: 27 mEq/L (ref 19–32)
Creatinine, Ser: 0.79 mg/dL (ref 0.40–1.20)
GFR: 104.7 mL/min (ref >60.00)
GLUCOSE: 110 mg/dL — AB (ref 70–99)
POTASSIUM: 4.1 meq/L (ref 3.5–5.1)
Sodium: 141 mEq/L (ref 135–145)
Total Protein: 7.4 g/dL (ref 6.0–8.3)

## 2018-09-03 MED ORDER — CAPSAICIN 0.1 % EX CREA: TOPICAL_CREAM | CUTANEOUS | 1 refills | Status: DC

## 2018-09-03 MED ORDER — LANSOPRAZOLE 30 MG PO TBDP: 30.0000 mg | ORAL_TABLET | Freq: Every day | ORAL | 3 refills | Status: DC

## 2018-09-03 MED ORDER — DICYCLOMINE HCL 10 MG PO CAPS: ORAL_CAPSULE | ORAL | 3 refills | Status: DC

## 2018-09-03 NOTE — Progress Notes (Signed)
HPI :  37 year old female with a history of dysphagia, reflux, chronic abdominal pain, vocal cord dysfunction, referred by Lin Landsman, MD for several symptoms. She was previously followed at our clinic by Dr. Deatra Ina, she has not seen him in several years, most recently was evaluated by [place].  She had multiple issues she wished to address today.  Dysphagia / GERD - long-standing dysphagia to both solids and liquids. She feels food and liquids get hung up in her proximal esophagus after swallowing. She's had an extensive evaluation in the past for this. She has symptoms that bother her frequently. She also has ongoing heartburn or reflux symptoms which bother her. More recently she has only been taking over-the-counter antacids, has not been on PPI in a while. From records in our system I see that she had a normal barium study in 2010 as well as a normal upper endoscopy in April 2015. She had an esophageal manometry performed by Dr. Deatra Ina in January 2011, which appeared mostly normal, although it appears she was given Botox injections at the [facility] as trial for her symptoms. She also had a remote upper endoscopy in 2009 at which point she had an empiric dilation with 18 mm Maloney. More recently at [facility] she had a barium swallow in October 2015 showing incomplete clearance of contrast in the upper esophagus, with changes concerning for dysmotility. She had a follow-up esophageal manometry performed in 2016, for which I can't see her report of this. Most recently she had a normal modified barium swallow in January 2019, as well as a normal CT scan of her neck and December 2018. She has been followed by ENT and told she had vocal cord dysfunction.  Abdominal pain - she reports ongoing epigastric to right upper quadrant persistent pain since she had a cholecystectomy a few years ago. Pain is constant and never goes away. She feels swelling and burning sensation in her right upper quadrant.  She also has spells of this in her left upper quadrant. She has not found relief from anything she is tried. She does not have any significant worsening with eating or any other clear precipitants.  Constipation / altered bowels - she reports she has chronic constipation with roughly one bowel movement per week. She states when she has this bowel movement she has a significant amount of diarrhea or loose stools with this, sometimes associated with leakage. She denies any significant blood in her stools. She has a lot of gas and bloating which bothers her. She takes Percocet 3 times a day for chronic pain in her joints she attributes to rheumatoid arthritis and fibromyalgia. She's been on this regimen for a long time. She had a prior colonoscopy in 2015 in which point she had one adenomatous polyp, due for repeat colonoscopy in 2020. No weight loss.   Most recent workup per review of chart: Modified barium swallow 01/26/2018 - normal CT neck 12/10/2017 - normal  Esophageal manometry 02/27/2015 - no report available Barium swallow 10/04/2014 - incomplete clearance of contrast in upper esophagus, absent secondary peristalsis, concern for dysmotility  Colonoscopy 05/10/2014 - 103mm ascending colon polyp, 39mm ascending colon polyp, diverticulosis sigmoid colon - one polyp TA, one polyp HP EGD 04/13/2014 - normal  Barium study 2010 normal     Past Medical History:  Diagnosis Date  . Abnormal Pap smear   . Achalasia    esphog stretched several times  . Anxiety   . Arthritis   . Asthma  rarely uses inhaler  . Blood transfusion 01/2006   [place]  . Cardiomyopathy ([place])   . Constipation due to opioid therapy   . Depression   . Dysrhythmia   . Fibromyalgia   . GERD (gastroesophageal reflux disease)    no meds - diet controlled  . Heart murmur   . Hiatal hernia   . History of abnormal Pap smear   . History of chlamydia 1990's  . HSV-2 infection   . HTN (hypertension)   . Occipital neuralgia of  right side 04/21/2017  . Pneumonia 05/2013  . PVC's (premature ventricular contractions)   . Shortness of breath dyspnea   . Status post dilation of esophageal narrowing   . Vaginal Pap smear, abnormal      Past Surgical History:  Procedure Laterality Date  . ADENOIDECTOMY  12/2002  . BLADDER SUSPENSION  06/11/2012   Procedure: TRANSVAGINAL TAPE (TVT) PROCEDURE;  Surgeon: Delice Lesch, MD;  Location: [facility];  Service: Gynecology;  Laterality: N/A;  Tension Free Vaginal Tape/cystoscopy  . BREAST SURGERY  07/2001   tumor removed right breast  . CHOLECYSTECTOMY N/A 01/23/2015   Procedure: LAPAROSCOPIC CHOLECYSTECTOMY ;  Surgeon: Fanny Skates, MD;  Location: [facility];  Service: General;  Laterality: N/A;  case booked with cholangiogram but MD did not complete intraoperative cholangiogram   . CYSTOSCOPY  06/11/2012   Procedure: CYSTOSCOPY;  Surgeon: Delice Lesch, MD;  Location: [facility];  Service: Gynecology;  Laterality: N/A;  . DILATION AND CURETTAGE OF UTERUS  1999  . DILATION AND CURETTAGE OF UTERUS  01/2006   retained products  . ESOPHAGOGASTRODUODENOSCOPY     with esophageal dilation x 3  . FOREIGN BODY REMOVAL  06/11/2012   Procedure: REMOVAL FOREIGN BODY EXTREMITY;  Surgeon: Delice Lesch, MD;  Location: [facility];  Service: Gynecology;  Laterality: Right;  Removal of Implanon   . LAPAROSCOPIC TUBAL LIGATION  06/11/2012   Procedure: LAPAROSCOPIC TUBAL LIGATION;  Surgeon: Delice Lesch, MD;  Location: [facility];  Service: Gynecology;  Laterality: Bilateral;  Laparoscopic Bilateral Fulgeration  . TONSILLECTOMY    . TUBAL LIGATION     Family History  Problem Relation Age of Onset  . Heart disease Father   . Hypertension Father   . Stroke Mother   . Hypertension Mother   . Heart attack Mother   . Breast cancer Mother   . Neuropathy Mother   . Arthritis Mother   . Asthma Mother   . Neuropathy Sister   . Arthritis Sister   . Asthma Sister    Social History   Tobacco Use  .  Smoking status: Current Some Day Smoker    Packs/day: 0.25    Years: 15.00    Pack years: 3.75    Types: Cigarettes  . Smokeless tobacco: Never Used  . Tobacco comment: HAS CUT BACK  Substance Use Topics  . Alcohol use: No    Comment: on occasion  . Drug use: No   Current Outpatient Medications  Medication Sig Dispense Refill  . atenolol (TENORMIN) 25 MG tablet Take 25 mg by mouth daily.    . clonazePAM (KLONOPIN) 2 MG tablet Take 2 mg by mouth every 8 (eight) hours.  0  . FLUoxetine (PROZAC) 20 MG/5ML solution TK 2.5 ML PO QAM UTD  3  . hydrOXYzine (VISTARIL) 25 MG capsule TK ONE C PO HS PRA  2  . LATUDA 20 MG TABS tablet TK 1 T PO QPM WITH A MEAL.  3  .  metroNIDAZOLE (METROGEL VAGINAL) 0.75 % vaginal gel Place 1 Applicatorful vaginally 2 (two) times daily. 70 g 0  . oxyCODONE-acetaminophen (PERCOCET) 10-325 MG tablet Take 1-2 tablets by mouth every 8 (eight) hours as needed for pain.   0  . pregabalin (LYRICA) 100 MG capsule Take 1 capsule (100 mg total) by mouth 2 (two) times daily. 60 capsule 3   No current facility-administered medications for this visit.    Allergies  Allergen Reactions  . Dust Mite Extract Shortness Of Breath  . Other Shortness Of Breath    Feathers, trees "almost everything outside"  . Pollen Extract Shortness Of Breath     Review of Systems: All systems reviewed and negative except where noted in HPI.   Lab Results  Component Value Date   WBC 6.9 04/17/2017   HGB 13.9 04/17/2017   HCT 40.2 04/17/2017   MCV 88.5 04/17/2017   PLT 183 04/17/2017    Lab Results  Component Value Date   CREATININE 0.86 04/17/2017   BUN 13 04/17/2017   NA 140 04/17/2017   K 4.1 04/17/2017   CL 106 04/17/2017   CO2 21 (L) 04/17/2017    Lab Results  Component Value Date   ALT 23 10/26/2016   AST 26 10/26/2016   ALKPHOS 67 10/26/2016   BILITOT 0.5 10/26/2016     Physical Exam: BP 126/82 (BP Location: Left Arm, Patient Position: Sitting, Cuff Size:  Normal)   Pulse 100   Ht 5' 8.5" (1.74 m)   Wt 218 lb (98.9 kg)   LMP 08/23/2018   Breastfeeding? No   BMI 32.66 kg/m  Constitutional: Pleasant, female in no acute distress. HEENT: Normocephalic and atraumatic. Conjunctivae are normal. No scleral icterus. Neck supple.  Cardiovascular: Normal rate, regular rhythm.  Pulmonary/chest: Effort normal and breath sounds normal. No wheezing, rales or rhonchi. Abdominal: Soft, nondistended, focal TTP along RUQ to epigastric area with positive Carnett sign, sensitive to very light touch along abdominal wall, no rash  There are no masses palpable. No hepatomegaly.  Extremities: no edema Lymphadenopathy: No cervical adenopathy noted. Neurological: Alert and oriented to person place and time. Skin: Skin is warm and dry. No rashes noted. Psychiatric: Normal mood and affect. Behavior is normal.   ASSESSMENT AND PLAN: 37 year old female here for a new patient assessment of the following issues:  Dysphagia / GERD - extensive evaluation with multiple EGDs, barium studies, and esophageal manometry in the past. She has dysphagia with both solids and liquids ongoing for a long time. Prior barium study seemed to localize this in the proximal esophagus associated with suspected dysmotility. I do not have the report from her last esophageal manometry done at [facility], I will request that. 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 She takes chronic narcotics which can cause esophageal dysfunction, this is been a difficult issue for her to deal with. I discussed with her that I'm not sure if we have a specific medication that would alleviate her symptoms if this is nonspecific dysmotility, however I will await her manometry report and if we cannot find this we may consider repeating it. In the interim she is having active reflux symptoms and not taking much for this. She has a hard time taking tablets, will give her a trial of Prevacid solu-tab 30 mg  once daily if insurance will cover it.   Abdominal pain - I suspect she likely has abdominal wall pain / neuropathic pain, perhaps related to cholecystectomy. She has a markedly  positive Carnett sign, prior CT imaging in years past has been unremarkable. I will obtain basic labs to ensure normal and stable given she's not lab work in the past year. I otherwise discussed with this entity is with her and discussed options. She is already on Lyrica. Given her other medications I would hold off on TCA given potential interactions. Will try topical capsaicin cream initially applied to the abdominal wall a few times a day and see if that helps. I counseled her this will cause a burning sensation and hopefully she can handle using this initially. If this does not work may consider trial of lidocaine patches and referral to pain management for possible trigger point injection. She agreed.  Constipation with overflow / bloating / chronic narcotic use - I suspect she is having constipation with overflow, perhaps related to her narcotic use. I discussed options with her. She has chronic pain and has been on narcotics for a while, this will be difficult to take her off of. Recommend initially treating her bowels with MiraLAX twice daily and titrate as needed for goal bowel movements every 1-2 days. Hopefully this will reduce her bloating and prevent overflow. If this does not work I asked her to call us, we'll consider other options. I will otherwise provide Bentyl to use as needed for cramping.  Patient in agreement with plan, all questions answered.  [place] Cellar, MD [facility]  CC: Lin Landsman, MD

## 2018-09-03 NOTE — Patient Instructions (Addendum)
If you are age 38 or older, your body mass index should be between 23-30. Your Body mass index is 32.66 kg/m. If this is out of the aforementioned range listed, please consider follow up with your Primary Care Provider.  If you are age 16 or younger, your body mass index should be between 19-25. Your Body mass index is 32.66 kg/m. If this is out of the aformentioned range listed, please consider follow up with your Primary Care Provider.   Your provider has requested that you go to the basement level for lab work before leaving today. Press "B" on the elevator. The lab is located at the first door on the left as you exit the elevator. CBC w/Diff CMET TSH  We have sent the following medications to your pharmacy for you to pick up at your convenience: Prevacid - call us if too expensive Bentyl Capsaicin cream  Take Miralax 1 capful twice daily.  May titrate up as needed.  Requesting records from Dr. Carlton Adam at [facility].  Will call you with results.  Thank you for choosing me and [facility].    Cellar, MD

## 2018-09-07 ENCOUNTER — Ambulatory Visit (INDEPENDENT_AMBULATORY_CARE_PROVIDER_SITE_OTHER): Payer: Medicaid Other

## 2018-09-07 ENCOUNTER — Ambulatory Visit (INDEPENDENT_AMBULATORY_CARE_PROVIDER_SITE_OTHER): Payer: Self-pay

## 2018-09-07 ENCOUNTER — Ambulatory Visit (INDEPENDENT_AMBULATORY_CARE_PROVIDER_SITE_OTHER): Payer: Medicaid Other | Admitting: Orthopaedic Surgery

## 2018-09-07 ENCOUNTER — Encounter (INDEPENDENT_AMBULATORY_CARE_PROVIDER_SITE_OTHER): Payer: Self-pay | Admitting: Orthopaedic Surgery

## 2018-09-07 VITALS — Ht 69.0 in | Wt 211.0 lb

## 2018-09-07 DIAGNOSIS — M545 Low back pain, unspecified: Secondary | ICD-10-CM | POA: Insufficient documentation

## 2018-09-07 DIAGNOSIS — G8929 Other chronic pain: Secondary | ICD-10-CM | POA: Insufficient documentation

## 2018-09-07 DIAGNOSIS — M25562 Pain in left knee: Secondary | ICD-10-CM

## 2018-09-07 DIAGNOSIS — M25561 Pain in right knee: Secondary | ICD-10-CM | POA: Insufficient documentation

## 2018-09-07 HISTORY — DX: Pain in right knee: M25.561

## 2018-09-07 HISTORY — DX: Other chronic pain: G89.29

## 2018-09-07 HISTORY — DX: Low back pain, unspecified: M54.50

## 2018-09-07 HISTORY — DX: Pain in right knee: M25.562

## 2018-09-07 MED ORDER — PREDNISONE 10 MG (21) PO TBPK: ORAL_TABLET | ORAL | 0 refills | Status: DC

## 2018-09-07 NOTE — Progress Notes (Signed)
Office Visit Note   Patient: Raven [name]           Date of Birth: Dec 26, 1981           MRN: [id number] Visit Date: 09/07/2018              Requested by: Lin Landsman, [place] [place], [place] [address] PCP: Lin Landsman, MD   Assessment & Plan: Visit Diagnoses:  1. Chronic bilateral low back pain, with sciatica presence unspecified   2. Acute pain of both knees     Plan: Impression is lumbar radiculopathy with underlying fibromyalgia and chronic pain syndrome.  I will call in a Sterapred taper at this time and send the patient to formal physical therapy.  She will follow-up with [place] as needed.  Call with concerns or questions in the meantime.  Follow-Up Instructions: Return if symptoms worsen or fail to improve.   Orders:  Orders Placed This Encounter  Procedures  . XR Lumbar Spine 2-3 Views  . XR KNEE 3 VIEW LEFT  . XR KNEE 3 VIEW RIGHT   Meds ordered this encounter  Medications  . predniSONE (STERAPRED UNI-PAK 21 TAB) 10 MG (21) TBPK tablet    Sig: Take as directed    Dispense:  21 tablet    Refill:  0      Procedures: No procedures performed   Clinical Data: No additional findings.   Subjective: Chief Complaint  Patient presents with  . Lower Back - Pain  . Left Knee - Pain  . Right Knee - Pain    HPI patient is a pleasant 37 year old female who presents to our clinic today with lower back pain worse on the left.  This is been ongoing for quite some time.  No known injury or change in activity.  The pain she has radiates down the back of both legs worse on the left.  Pain is worse when standing.  No numbness, tingling or burning.  She does get some relief with oxycodone for which she is given by her PCP.  She was also on a muscle relaxer at one point but her insurance stopped covering this.  She has also been seen by pain clinic for which she got nothing more than Biofreeze.  No bowel or bladder change.  No saddle paresthesias.  Review of  Systems as detailed in HPI.  All others reviewed and are negative.   Objective: Vital Signs: Ht 5\' 9"  (1.753 m)   Wt 211 lb (95.7 kg)   LMP 08/23/2018   BMI 31.16 kg/m   Physical Exam well-developed well-nourished female no acute distress.  Alert and oriented x3.  Ortho Exam examination of her lower back reveals no spinous or paraspinous tenderness.  Increased pain with lumbar flexion and extension.  Positive straight leg raise on the left, negative on the right.  Negative logroll both sides.  She is neurovascular intact distally.  Specialty Comments:  No specialty comments available.  Imaging: Xr Knee 3 View Left  Result Date: 09/07/2018 No acute or structural abnormalities  Xr Knee 3 View Right  Result Date: 09/07/2018 No acute or structural abnormalities  Xr Lumbar Spine 2-3 Views  Result Date: 09/07/2018 No acute or structural abnormalities    PMFS History: Patient Active Problem List   Diagnosis Date Noted  . Chronic bilateral low back pain 09/07/2018  . Acute pain of both knees 09/07/2018  . 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 01/18/2018  .  Chronically on opiate therapy 01/18/2018  . Excessive daytime sleepiness 01/18/2018  . Vivid dream 01/18/2018  . 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 Occipital neuralgia of right side 04/21/2017  . Fibromyalgia 04/21/2017  . UTI (urinary tract infection) 02/12/2017  . Gallstones and inflammation of gallbladder without obstruction 01/23/2015  . S/P laparoscopic surgery 01/23/2015  . Upper airway cough syndrome 09/05/2014  . Cigarette smoker 08/09/2014  . Dyspnea 08/08/2014  . Abdominal pain, left lower quadrant 03/27/2014  . Cardiomyopathy, idiopathic ([place]) 01/09/2014  . History of chlamydia 01/09/2013  . 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  . History of abnormal Pap smear 01/09/2013  . Achalasia   . Abnormal Pap smear   .  Depression   . HSV-2 infection   . Heart palpitations   . Heart murmur   . Anxiety   . GERD (gastroesophageal reflux disease)   . 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 PVC's (premature ventricular contractions) 05/19/2011  . Palpitation 04/11/2011  . ESOPHAGEAL STRICTURE 08/10/2009  . DYSPHAGIA 08/10/2009  . Esophageal reflux 11/30/2008  . Unspecified constipation 11/30/2008  . DEPRESSION 10/09/2008  . ASTHMA 10/09/2008   Past Medical History:  Diagnosis Date  . Abnormal Pap smear   . Achalasia    esphog stretched several times  . Anxiety   . Arthritis   . Asthma    rarely uses inhaler  . Blood transfusion 01/2006   [place]  . Cardiomyopathy ([place])   . Constipation due to opioid therapy   . Depression   . Dysrhythmia   . Fibromyalgia   . GERD (gastroesophageal reflux disease)    no meds - diet controlled  . Heart murmur   . Hiatal hernia   . History of abnormal Pap smear   . History of chlamydia 1990's  . HSV-2 infection   . HTN (hypertension)   . Occipital neuralgia of right side 04/21/2017  . Pneumonia 05/2013  . PVC's (premature ventricular contractions)   . Shortness of breath dyspnea   . Status post dilation of esophageal narrowing   . Vaginal Pap smear, abnormal     Family History  Problem Relation Age of Onset  . Heart disease Father   . Hypertension Father   . Stroke Mother   . Hypertension Mother   . Heart attack Mother   . Breast cancer Mother   . Neuropathy Mother   . Arthritis Mother   . Asthma Mother   . Neuropathy Sister   . Arthritis Sister   . Asthma Sister     Past Surgical History:  Procedure Laterality Date  . ADENOIDECTOMY  12/2002  . BLADDER SUSPENSION  06/11/2012   Procedure: TRANSVAGINAL TAPE (TVT) PROCEDURE;  Surgeon: Delice Lesch, MD;  Location: [facility];  Service: Gynecology;  Laterality: N/A;  Tension Free Vaginal Tape/cystoscopy  . BREAST SURGERY  07/2001   tumor removed right breast  . CHOLECYSTECTOMY N/A 01/23/2015    Procedure: LAPAROSCOPIC CHOLECYSTECTOMY ;  Surgeon: Fanny Skates, MD;  Location: [facility];  Service: General;  Laterality: N/A;  case booked with cholangiogram but MD did not complete intraoperative cholangiogram   . CYSTOSCOPY  06/11/2012   Procedure: CYSTOSCOPY;  Surgeon: Delice Lesch, MD;  Location: [facility];  Service: Gynecology;  Laterality: N/A;  . DILATION AND CURETTAGE OF UTERUS  1999  . DILATION AND CURETTAGE OF UTERUS  01/2006   retained products  . ESOPHAGOGASTRODUODENOSCOPY     with esophageal dilation  x 3  . FOREIGN BODY REMOVAL  06/11/2012   Procedure: REMOVAL FOREIGN BODY EXTREMITY;  Surgeon: Delice Lesch, MD;  Location: [facility];  Service: Gynecology;  Laterality: Right;  Removal of Implanon   . LAPAROSCOPIC TUBAL LIGATION  06/11/2012   Procedure: LAPAROSCOPIC TUBAL LIGATION;  Surgeon: Delice Lesch, MD;  Location: [facility];  Service: Gynecology;  Laterality: Bilateral;  Laparoscopic Bilateral Fulgeration  . TONSILLECTOMY    . TUBAL LIGATION     Social History   Occupational History  . Not on file  Tobacco Use  . Smoking status: Current Some Day Smoker    Packs/day: 0.25    Years: 15.00    Pack years: 3.75    Types: Cigarettes  . Smokeless tobacco: Never Used  . Tobacco comment: HAS CUT BACK  Substance and Sexual Activity  . Alcohol use: No    Comment: on occasion  . Drug use: No  . Sexual activity: Yes    Birth control/protection: Surgical

## 2018-09-15 ENCOUNTER — Telehealth: Payer: Self-pay

## 2018-09-15 NOTE — Telephone Encounter (Signed)
Referral sent to scheduling and notes filed

## 2018-09-16 ENCOUNTER — Telehealth: Payer: Self-pay | Admitting: Gastroenterology

## 2018-09-16 NOTE — Telephone Encounter (Signed)
Called and spoke to pt. Relayed results and recommendations. She has not been able to get the Prevacid yet since her insurance wont pay for it but she is going to try and see if she can use [facility] to see where it is the cheapest.  She has an appt with her "throat" doctor tomorrow.  Appreciated the call.

## 2018-09-16 NOTE — Telephone Encounter (Signed)
Results from prior testing at [facility] arrived  Esophageal manometry 02/23/15 - normal, no evidence of dysmotility   Jan can you let her know I reviewed her prior manometry testing which looked okay. She's had an extensive evaluation for dysphagia in the past. She is on chronic narcotics which can cause esophageal dysfunction, although again while this was suggested on prior barium study, not seen on manometry. Hopefully she is doing better on the Prevacid solutab. Thanks

## 2018-11-11 NOTE — Progress Notes (Deleted)
No chief complaint on file.  History of Present Illness: 37 yo female with history of achalasia, asthma, anxiety, non-ischemic cardiomyopathy and PVCs who is here today to re-establish in our office. She has been followed Dr. Caryl Comes in our office for PVCs. She is known to have mild LV systolic dysfunction. Last echo February 2016 with LVEF=45-50% with global hypokinesis. She has not been seen by Dr. Caryl Comes since 2016. She tells me today that she ***  Primary Care Physician: Lin Landsman, MD  Primary Cardiologist: Dr. Caryl Comes  Past Medical History:  Diagnosis Date  . Abnormal Pap smear   . Achalasia    esphog stretched several times  . Anxiety   . Arthritis   . Asthma    rarely uses inhaler  . Blood transfusion 01/2006   [place]  . Cardiomyopathy ([place])   . Constipation due to opioid therapy   . Depression   . Dysrhythmia   . Fibromyalgia   . GERD (gastroesophageal reflux disease)    no meds - diet controlled  . Heart murmur   . Hiatal hernia   . History of abnormal Pap smear   . History of chlamydia 1990's  . HSV-2 infection   . HTN (hypertension)   . Occipital neuralgia of right side 04/21/2017  . Pneumonia 05/2013  . PVC's (premature ventricular contractions)   . Shortness of breath dyspnea   . Status post dilation of esophageal narrowing   . Vaginal Pap smear, abnormal     Past Surgical History:  Procedure Laterality Date  . ADENOIDECTOMY  12/2002  . BLADDER SUSPENSION  06/11/2012   Procedure: TRANSVAGINAL TAPE (TVT) PROCEDURE;  Surgeon: Delice Lesch, MD;  Location: [facility];  Service: Gynecology;  Laterality: N/A;  Tension Free Vaginal Tape/cystoscopy  . BREAST SURGERY  07/2001   tumor removed right breast  . CHOLECYSTECTOMY N/A 01/23/2015   Procedure: LAPAROSCOPIC CHOLECYSTECTOMY ;  Surgeon: Fanny Skates, MD;  Location: [facility];  Service: General;  Laterality: N/A;  case booked with cholangiogram but MD did not complete intraoperative cholangiogram   . CYSTOSCOPY  06/11/2012     Procedure: CYSTOSCOPY;  Surgeon: Delice Lesch, MD;  Location: [facility];  Service: Gynecology;  Laterality: N/A;  . DILATION AND CURETTAGE OF UTERUS  1999  . DILATION AND CURETTAGE OF UTERUS  01/2006   retained products  . ESOPHAGOGASTRODUODENOSCOPY     with esophageal dilation x 3  . FOREIGN BODY REMOVAL  06/11/2012   Procedure: REMOVAL FOREIGN BODY EXTREMITY;  Surgeon: Delice Lesch, MD;  Location: [facility];  Service: Gynecology;  Laterality: Right;  Removal of Implanon   . LAPAROSCOPIC TUBAL LIGATION  06/11/2012   Procedure: LAPAROSCOPIC TUBAL LIGATION;  Surgeon: Delice Lesch, MD;  Location: [facility];  Service: Gynecology;  Laterality: Bilateral;  Laparoscopic Bilateral Fulgeration  . TONSILLECTOMY    . TUBAL LIGATION      Current Outpatient Medications  Medication Sig Dispense Refill  . atenolol (TENORMIN) 25 MG tablet Take 25 mg by mouth daily.    . Capsaicin 0.1 % CREA Apple twice daily as needed 30 g 1  . clonazePAM (KLONOPIN) 2 MG tablet Take 2 mg by mouth every 8 (eight) hours.  0  . dicyclomine (BENTYL) 10 MG capsule Take 1-2 every 8 hours as needed 60 capsule 3  . FLUoxetine (PROZAC) 20 MG/5ML solution TK 2.5 ML PO QAM UTD  3  . hydrOXYzine (VISTARIL) 25 MG capsule TK ONE C PO HS PRA  2  .  lansoprazole (PREVACID SOLUTAB) 30 MG disintegrating tablet Take 1 tablet (30 mg total) by mouth daily. 90 tablet 3  . LATUDA 20 MG TABS tablet TK 1 T PO QPM WITH A MEAL.  3  . metroNIDAZOLE (METROGEL VAGINAL) 0.75 % vaginal gel Place 1 Applicatorful vaginally 2 (two) times daily. 70 g 0  . oxyCODONE-acetaminophen (PERCOCET) 10-325 MG tablet Take 1-2 tablets by mouth every 8 (eight) hours as needed for pain.   0  . predniSONE (STERAPRED UNI-PAK 21 TAB) 10 MG (21) TBPK tablet Take as directed 21 tablet 0  . pregabalin (LYRICA) 100 MG capsule Take 1 capsule (100 mg total) by mouth 2 (two) times daily. 60 capsule 3   No current facility-administered medications for this visit.      Allergies  Allergen Reactions  . Dust Mite Extract Shortness Of Breath  . Other Shortness Of Breath    Feathers, trees "almost everything outside"  . Pollen Extract Shortness Of Breath    Social History   Socioeconomic History  . Marital status: Single    Spouse name: Not on file  . Number of children: 2  . Years of education: College  . Highest education level: Not on file  Occupational History  . Not on file  Social Needs  . Financial resource strain: Not on file  . Food insecurity:    Worry: Not on file    Inability: Not on file  . Transportation needs:    Medical: Not on file    Non-medical: Not on file  Tobacco Use  . Smoking status: Current Some Day Smoker    Packs/day: 0.25    Years: 15.00    Pack years: 3.75    Types: Cigarettes  . Smokeless tobacco: Never Used  . Tobacco comment: HAS CUT BACK  Substance and Sexual Activity  . Alcohol use: No    Comment: on occasion  . Drug use: No  . Sexual activity: Yes    Birth control/protection: Surgical  Lifestyle  . Physical activity:    Days per week: Not on file    Minutes per session: Not on file  . Stress: Not on file  Relationships  . Social connections:    Talks on phone: Not on file    Gets together: Not on file    Attends religious service: Not on file    Active member of club or organization: Not on file    Attends meetings of clubs or organizations: Not on file    Relationship status: Not on file  . Intimate partner violence:    Fear of current or ex partner: Not on file    Emotionally abused: Not on file    Physically abused: Not on file    Forced sexual activity: Not on file  Other Topics Concern  . Not on file  Social History Narrative   Lives with fiance and children   Caffeine use:  Hot ginger tea- daily   Left-handed    Family History  Problem Relation Age of Onset  . Heart disease Father   . Hypertension Father   . Stroke Mother   . Hypertension Mother   . Heart attack  Mother   . Breast cancer Mother   . Neuropathy Mother   . Arthritis Mother   . Asthma Mother   . Neuropathy Sister   . Arthritis Sister   . Asthma Sister     Review of Systems:  As stated in the HPI and otherwise negative.  There were no vitals taken for this visit.  Physical Examination: General: Well developed, well nourished, NAD  HEENT: OP clear, mucus membranes moist  SKIN: warm, dry. No rashes. Neuro: No focal deficits  Musculoskeletal: Muscle strength 5/5 all ext  Psychiatric: Mood and affect normal  Neck: No JVD, no carotid bruits, no thyromegaly, no lymphadenopathy.  Lungs:Clear bilaterally, no wheezes, rhonci, crackles Cardiovascular: Regular rate and rhythm. No murmurs, gallops or rubs. Abdomen:Soft. Bowel sounds present. Non-tender.  Extremities: No lower extremity edema. Pulses are 2 + in the bilateral DP/PT.  EKG:  EKG {ACTION; IS/IS TKW:[id number]} ordered today. The ekg ordered today demonstrates ***  Recent Labs: 09/03/2018: ALT 21; BUN 11; Creatinine, Ser 0.79; Hemoglobin 13.6; Platelets 199.0; Potassium 4.1; Sodium 141; TSH 1.06   Lipid Panel No results found for: CHOL, TRIG, HDL, CHOLHDL, VLDL, LDLCALC, LDLDIRECT   Wt Readings from Last 3 Encounters:  09/07/18 211 lb (95.7 kg)  09/03/18 218 lb (98.9 kg)  01/18/18 203 lb (92.1 kg)     Other studies Reviewed: Additional studies/ records that were reviewed today include: ***. Review of the above records demonstrates: ***   Assessment and Plan:   1.   Current medicines are reviewed at length with the patient today.  The patient {ACTIONS; HAS/DOES NOT HAVE:19233} concerns regarding medicines.  The following changes have been made:  {PLAN; NO CHANGE:13088:s}  Labs/ tests ordered today include: *** No orders of the defined types were placed in this encounter.    Disposition:   FU with *** in {gen number [id number]} {TIME; UNITS DAY/WEEK/MONTH:19136}   Signed, Lauree Chandler,  MD 11/11/2018 6:21 PM    [facility] [address], [place], [place]  [address] Phone: [phone]; Fax: [phone]

## 2018-11-12 ENCOUNTER — Ambulatory Visit: Payer: Medicaid Other | Admitting: Cardiovascular Disease

## 2018-11-16 ENCOUNTER — Encounter: Payer: Self-pay | Admitting: Cardiovascular Disease

## 2018-12-04 ENCOUNTER — Emergency Department (HOSPITAL_COMMUNITY)
Admission: EM | Admit: 2018-12-04 | Discharge: 2018-12-05 | Disposition: A | Discharge status: Home / Self Care | Payer: Medicaid Other | Attending: Emergency Medicine | Admitting: Emergency Medicine

## 2018-12-04 ENCOUNTER — Encounter (HOSPITAL_COMMUNITY): Payer: Self-pay | Admitting: *Deleted

## 2018-12-04 ENCOUNTER — Other Ambulatory Visit: Payer: Self-pay

## 2018-12-04 ENCOUNTER — Emergency Department (HOSPITAL_COMMUNITY): Payer: Medicaid Other

## 2018-12-04 DIAGNOSIS — F17210 Nicotine dependence, cigarettes, uncomplicated: Secondary | ICD-10-CM | POA: Diagnosis not present

## 2018-12-04 DIAGNOSIS — J029 Acute pharyngitis, unspecified: Secondary | ICD-10-CM | POA: Insufficient documentation

## 2018-12-04 DIAGNOSIS — R079 Chest pain, unspecified: Secondary | ICD-10-CM

## 2018-12-04 DIAGNOSIS — Z79899 Other long term (current) drug therapy: Secondary | ICD-10-CM | POA: Insufficient documentation

## 2018-12-04 DIAGNOSIS — R0789 Other chest pain: Secondary | ICD-10-CM | POA: Diagnosis not present

## 2018-12-04 DIAGNOSIS — J45909 Unspecified asthma, uncomplicated: Secondary | ICD-10-CM | POA: Insufficient documentation

## 2018-12-04 DIAGNOSIS — R0602 Shortness of breath: Secondary | ICD-10-CM | POA: Diagnosis present

## 2018-12-04 DIAGNOSIS — I10 Essential (primary) hypertension: Secondary | ICD-10-CM | POA: Diagnosis not present

## 2018-12-04 LAB — CBC WITH DIFFERENTIAL/PLATELET
Abs Immature Granulocytes: 0.03 10*3/uL (ref 0.00–0.07)
Basophils Absolute: 0.1 10*3/uL (ref 0.0–0.1)
Basophils Relative: 0 %
Eosinophils Absolute: 0.1 10*3/uL (ref 0.0–0.5)
Eosinophils Relative: 1 %
HCT: 38.7 % (ref 36.0–46.0)
Hemoglobin: 13.1 g/dL (ref 12.0–15.0)
Immature Granulocytes: 0 %
Lymphocytes Relative: 31 %
Lymphs Abs: 3.8 10*3/uL (ref 0.7–4.0)
MCH: 30.3 pg (ref 26.0–34.0)
MCHC: 33.9 g/dL (ref 30.0–36.0)
MCV: 89.4 fL (ref 80.0–100.0)
Monocytes Absolute: 0.9 10*3/uL (ref 0.1–1.0)
Monocytes Relative: 8 %
Neutro Abs: 7.4 10*3/uL (ref 1.7–7.7)
Neutrophils Relative %: 60 %
Platelets: 226 10*3/uL (ref 150–400)
RBC: 4.33 MIL/uL (ref 3.87–5.11)
RDW: 12.5 % (ref 11.5–15.5)
WBC: 12.3 10*3/uL — ABNORMAL HIGH (ref 4.0–10.5)
nRBC: 0 % (ref 0.0–0.2)

## 2018-12-04 LAB — BASIC METABOLIC PANEL
Anion gap: 11 (ref 5–15)
BUN: 10 mg/dL (ref 6–20)
CO2: 21 mmol/L — ABNORMAL LOW (ref 22–32)
Calcium: 8.9 mg/dL (ref 8.9–10.3)
Chloride: 107 mmol/L (ref 98–111)
Creatinine, Ser: 0.75 mg/dL (ref 0.44–1.00)
GFR calc Af Amer: >60 mL/min (ref >60)
GFR calc non Af Amer: >60 mL/min (ref >60)
Glucose, Bld: 100 mg/dL — ABNORMAL HIGH (ref 70–99)
Potassium: 3.4 mmol/L — ABNORMAL LOW (ref 3.5–5.1)
Sodium: 139 mmol/L (ref 135–145)

## 2018-12-04 LAB — LIPASE, BLOOD: Lipase: 26 U/L (ref 11–51)

## 2018-12-04 LAB — GROUP A STREP BY PCR: Group A Strep by PCR: NOT DETECTED

## 2018-12-04 LAB — TROPONIN I: Troponin I: <0.03 ng/mL (ref <0.03)

## 2018-12-04 MED ORDER — ALUM & MAG HYDROXIDE-SIMETH 200-200-20 MG/5ML PO SUSP
30.0000 mL | Freq: Once | ORAL | Status: AC
  Administered 2018-12-04: 30 mL via ORAL
  Filled 2018-12-04: qty 30

## 2018-12-04 MED ORDER — ONDANSETRON 4 MG PO TBDP
4.0000 mg | ORAL_TABLET | Freq: Once | ORAL | Status: AC
  Administered 2018-12-04: 4 mg via ORAL
  Filled 2018-12-04: qty 1

## 2018-12-04 MED ORDER — KETOROLAC TROMETHAMINE 15 MG/ML IJ SOLN
15.0000 mg | Freq: Once | INTRAMUSCULAR | Status: AC
  Administered 2018-12-04: 15 mg via INTRAMUSCULAR
  Filled 2018-12-04: qty 1

## 2018-12-04 MED ORDER — HYDROMORPHONE HCL 1 MG/ML IJ SOLN
1.0000 mg | Freq: Once | INTRAMUSCULAR | Status: AC
  Administered 2018-12-04: 1 mg via INTRAMUSCULAR
  Filled 2018-12-04: qty 1

## 2018-12-04 MED ORDER — LIDOCAINE VISCOUS HCL 2 % MT SOLN
15.0000 mL | Freq: Once | OROMUCOSAL | Status: AC
  Administered 2018-12-04: 15 mL via ORAL
  Filled 2018-12-04: qty 15

## 2018-12-04 NOTE — Discharge Instructions (Signed)
Your labs, EKG and CXR today look ok. You need to follow-up with your gastroenterologist with regards to your difficulty with swallowing.

## 2018-12-04 NOTE — ED Notes (Signed)
Pt ambulated to restroom. 

## 2018-12-04 NOTE — ED Provider Notes (Signed)
[facility] DEPT Provider Note   CSN: [id number] Arrival date & time: 12/04/18  1900     History   Chief Complaint Chief Complaint  Patient presents with  . Shortness of Breath  . Sore Throat  . Abdominal Pain    HPI Raven [name] is a 37 y.o. female.  HPI   37 year old female with tightness in her chest.  She reports sensation that her throat is closing.  Worse over the past few days.  She reports a past h.  Istory of esophageal problems reports that she has needed previous dilation.  Mild shortness of breath.  No fevers or chills.  Occasional nonproductive cough.  No unusual leg pain or swelling.  Nausea.  No vomiting.   Past Medical History:  Diagnosis Date  . Abnormal Pap smear   . Achalasia    esphog stretched several times  . Anxiety   . Arthritis   . Asthma    rarely uses inhaler  . Blood transfusion 01/2006   [place]  . Cardiomyopathy ([place])   . Constipation due to opioid therapy   . Depression   . Dysrhythmia   . Fibromyalgia   . GERD (gastroesophageal reflux disease)    no meds - diet controlled  . Heart murmur   . Hiatal hernia   . History of abnormal Pap smear   . History of chlamydia 1990's  . HSV-2 infection   . HTN (hypertension)   . Occipital neuralgia of right side 04/21/2017  . Pneumonia 05/2013  . PVC's (premature ventricular contractions)   . Shortness of breath dyspnea   . Status post dilation of esophageal narrowing   . Vaginal Pap smear, abnormal     Patient Active Problem List   Diagnosis Date Noted  . Chronic bilateral low back pain 09/07/2018  . Acute pain of both knees 09/07/2018  . 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 01/18/2018  . Chronically on opiate therapy 01/18/2018  . Excessive daytime sleepiness 01/18/2018  . Vivid dream 01/18/2018  . 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 Occipital neuralgia  of right side 04/21/2017  . Fibromyalgia 04/21/2017  . UTI (urinary tract infection) 02/12/2017  . Gallstones and inflammation of gallbladder without obstruction 01/23/2015  . S/P laparoscopic surgery 01/23/2015  . Upper airway cough syndrome 09/05/2014  . Cigarette smoker 08/09/2014  . Dyspnea 08/08/2014  . Abdominal pain, left lower quadrant 03/27/2014  . Cardiomyopathy, idiopathic ([place]) 01/09/2014  . History of chlamydia 01/09/2013  . 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  . History of abnormal Pap smear 01/09/2013  . Achalasia   . Abnormal Pap smear   . Depression   . HSV-2 infection   . Heart palpitations   . Heart murmur   . Anxiety   . GERD (gastroesophageal reflux disease)   . 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 PVC's (premature ventricular contractions) 05/19/2011  . Palpitation 04/11/2011  . ESOPHAGEAL STRICTURE 08/10/2009  . DYSPHAGIA 08/10/2009  . Esophageal reflux 11/30/2008  . Unspecified constipation 11/30/2008  . DEPRESSION 10/09/2008  . ASTHMA 10/09/2008    Past Surgical History:  Procedure Laterality Date  . ADENOIDECTOMY  12/2002  . BLADDER SUSPENSION  06/11/2012   Procedure: TRANSVAGINAL TAPE (TVT) PROCEDURE;  Surgeon: Delice Lesch, MD;  Location: [facility];  Service: Gynecology;  Laterality: N/A;  Tension Free Vaginal Tape/cystoscopy  . BREAST SURGERY  07/2001   tumor removed right  breast  . CHOLECYSTECTOMY N/A 01/23/2015   Procedure: LAPAROSCOPIC CHOLECYSTECTOMY ;  Surgeon: Fanny Skates, MD;  Location: [facility];  Service: General;  Laterality: N/A;  case booked with cholangiogram but MD did not complete intraoperative cholangiogram   . CYSTOSCOPY  06/11/2012   Procedure: CYSTOSCOPY;  Surgeon: Delice Lesch, MD;  Location: [facility];  Service: Gynecology;  Laterality: N/A;  . DILATION AND CURETTAGE OF UTERUS  1999  . DILATION AND CURETTAGE OF UTERUS  01/2006   retained products  . ESOPHAGOGASTRODUODENOSCOPY     with esophageal dilation x 3  . FOREIGN  BODY REMOVAL  06/11/2012   Procedure: REMOVAL FOREIGN BODY EXTREMITY;  Surgeon: Delice Lesch, MD;  Location: [facility];  Service: Gynecology;  Laterality: Right;  Removal of Implanon   . LAPAROSCOPIC TUBAL LIGATION  06/11/2012   Procedure: LAPAROSCOPIC TUBAL LIGATION;  Surgeon: Delice Lesch, MD;  Location: [facility];  Service: Gynecology;  Laterality: Bilateral;  Laparoscopic Bilateral Fulgeration  . TONSILLECTOMY    . TUBAL LIGATION       OB History    Gravida  5   Para  2   Term  2   Preterm  0   AB  2   Living  2     SAB  2   TAB      Ectopic      Multiple      Live Births               Home Medications    Prior to Admission medications   Medication Sig Start Date End Date Taking? Authorizing Provider  atenolol (TENORMIN) 25 MG tablet Take 25 mg by mouth daily.    [provider]  Capsaicin 0.1 % CREA Apple twice daily as needed 09/03/18   Armbruster, Carlota Raspberry, MD  clonazePAM (KLONOPIN) 2 MG tablet Take 2 mg by mouth every 8 (eight) hours. 04/02/17   [provider]  dicyclomine (BENTYL) 10 MG capsule Take 1-2 every 8 hours as needed 09/03/18   Armbruster, Carlota Raspberry, MD  FLUoxetine (PROZAC) 20 MG/5ML solution TK 2.5 ML PO QAM UTD 01/13/18   [provider]  hydrOXYzine (ATARAX/VISTARIL) 50 MG tablet  11/08/18   [provider]  hydrOXYzine (VISTARIL) 25 MG capsule TK ONE C PO HS PRA 01/13/18   [provider]  lansoprazole (PREVACID SOLUTAB) 30 MG disintegrating tablet Take 1 tablet (30 mg total) by mouth daily. 09/03/18   Armbruster, Carlota Raspberry, MD  LATUDA 20 MG TABS tablet TK 1 T PO QPM WITH A MEAL. 01/13/18   [provider]  LATUDA 60 MG TABS TK 1 T PO QPM WC 11/08/18   [provider]  metroNIDAZOLE (FLAGYL) 500 MG tablet TK 1 T PO Q 8 H FOR 14 DAYS 11/21/18   [provider]  metroNIDAZOLE (METROGEL VAGINAL) 0.75 % vaginal gel Place 1 Applicatorful vaginally 2 (two) times daily. 02/13/17   Luvenia Redden, PA-C  oxyCODONE-acetaminophen (PERCOCET) 10-325 MG tablet Take 1-2 tablets by mouth every 8 (eight) hours as needed for pain.  06/09/16   [provider]  predniSONE (STERAPRED UNI-PAK 21 TAB) 10 MG (21) TBPK tablet Take as directed 09/07/18   Aundra Dubin, PA-C  pregabalin (LYRICA) 100 MG capsule Take 1 capsule (100 mg total) by mouth 2 (two) times daily. 04/21/17   Kathrynn Ducking, MD  propranolol (INDERAL) 10 MG tablet TK 1 T PO BID PRA 11/09/18   [provider]  traZODone (DESYREL) 50 MG tablet TK 1 T PO HS FOR SLP 11/08/18   [provider]    Family History Family History  Problem Relation Age of Onset  . Heart disease Father   . Hypertension Father   . Stroke Mother   . Hypertension Mother   . Heart attack Mother   . Breast cancer Mother   . Neuropathy Mother   . Arthritis Mother   . Asthma Mother   . Neuropathy Sister   . Arthritis Sister   . Asthma Sister     Social History Social History   Tobacco Use  . Smoking status: Current Some Day Smoker    Packs/day: 0.25    Years: 15.00    Pack years: 3.75    Types: Cigarettes  . Smokeless tobacco: Never Used  . Tobacco comment: HAS CUT BACK  Substance Use Topics  . Alcohol use: No    Comment: on occasion  . Drug use: No     Allergies   Dust mite extract; Other; and Pollen extract   Review of Systems Review of Systems  All systems reviewed and negative, other than as noted in HPI.  Physical Exam Updated Vital Signs BP (!) 150/93 (BP Location: Right Arm)   Pulse 91   Temp 97.9 F (36.6 C) (Oral)   Resp 18   Ht 5\' 9"  (1.753 m)   Wt 94.8 kg   LMP 11/11/2018   SpO2 100%   BMI 30.86 kg/m   Physical Exam Vitals signs and nursing note reviewed.  Constitutional:      General: She is not in acute distress.    Appearance: She is well-developed.  HENT:     Head: Normocephalic and atraumatic.     Mouth/Throat:     Mouth: Mucous membranes are moist.     Pharynx:  Oropharynx is clear. No pharyngeal swelling or oropharyngeal exudate.  Eyes:     General:        Right eye: No discharge.        Left eye: No discharge.     Conjunctiva/sclera: Conjunctivae normal.  Neck:     Musculoskeletal: Neck supple.  Cardiovascular:     Rate and Rhythm: Normal rate and regular rhythm.     Heart sounds: Normal heart sounds. No murmur. No friction rub. No gallop.   Pulmonary:     Effort: Pulmonary effort is normal. No respiratory distress.     Breath sounds: Normal breath sounds.  Abdominal:     General: There is no distension.     Palpations: Abdomen is soft.     Tenderness: There is no abdominal tenderness.  Musculoskeletal:        General: No tenderness.  Skin:    General: Skin is warm and dry.  Neurological:     Mental Status: She is alert.  Psychiatric:        Behavior: Behavior normal.        Thought Content: Thought content normal.      ED Treatments / Results  Labs (all labs ordered are listed, but only abnormal results are displayed) Labs Reviewed  CBC WITH DIFFERENTIAL/PLATELET - Abnormal; Notable for the following components:      Result Value   WBC 12.3 (*)    All other components within normal limits  BASIC METABOLIC PANEL - Abnormal; Notable for the following components:   Potassium 3.4 (*)    CO2 21 (*)    Glucose, Bld 100 (*)  All other components within normal limits  GROUP A STREP BY PCR  TROPONIN I  LIPASE, BLOOD    EKG EKG Interpretation  Date/Time:  Saturday December 04 2018 19:07:51 EST Ventricular Rate:  85 PR Interval:    QRS Duration: 99 QT Interval:  383 QTC Calculation: 456 R Axis:   17 Text Interpretation:  Sinus arrhythmia Anterior infarct, old Confirmed by Virgel Manifold [phone]) on 12/04/2018 7:30:24 PM   Radiology No results found.   Dg Chest 2 View  Result Date: 12/04/2018 CLINICAL DATA:  Shortness of breath, throat tightness.  Dysphagia. EXAM: CHEST - 2 VIEW COMPARISON:  Chest radiograph October 20, 2017 FINDINGS: Cardiomediastinal silhouette is unremarkable for this low inspiratory examination with crowded vasculature markings. The lungs are clear without pleural effusions or focal consolidations. Trachea projects midline and there is no pneumothorax. Included soft tissue planes and osseous structures are non-suspicious. Surgical clips in the included right abdomen compatible with cholecystectomy. IMPRESSION: No active cardiopulmonary process. Electronically Signed   By: Elon Alas M.D.   On: 12/04/2018 20:20    Procedures Procedures (including critical care time)  Medications Ordered in ED Medications - No data to display   Initial Impression / Assessment and Plan / ED Course  I have reviewed the triage vital signs and the nursing notes.  Pertinent labs & imaging results that were available during my care of the patient were reviewed by me and considered in my medical decision making (see chart for details).     37 year old female with sore throat/chest discomfort.  Suspect this may be GI in etiology.  Atypical symptoms for ACS.  EKG with no overt ischemic changes.  ED work-up all at all was fairly unremarkable.  I doubt PE, dissection or other emergent process.  Is to give a trial of a PPI.  Emergent return precautions were discussed.  Outpatient follow-up otherwise. Final Clinical Impressions(s) / ED Diagnoses   Final diagnoses:  Sore throat  Chest pain, unspecified type    ED Discharge Orders    None       Virgel Manifold, MD 12/10/18 [phone]

## 2018-12-04 NOTE — ED Triage Notes (Signed)
Pt reports feeling SOB and throat closing x few days.  Pt reports it's very painful to swallow.  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  Pt stated that the last time she felt like this, she had pneumonia. Pt states her pain is in her throat and chest.  Pt endorses nausea, vomiting, and diarrhea.  Pt a/o x 4 and ambulatory in triage. Per pt and SO, pt voice is normally hoarse.  Pt is speaking in complete sentences without difficulty in triage.

## 2018-12-08 ENCOUNTER — Telehealth: Payer: Self-pay | Admitting: Neurology

## 2018-12-08 DIAGNOSIS — J3800 Paralysis of vocal cords and larynx, unspecified: Secondary | ICD-10-CM

## 2018-12-08 DIAGNOSIS — R490 Dysphonia: Secondary | ICD-10-CM | POA: Insufficient documentation

## 2018-12-08 DIAGNOSIS — F419 Anxiety disorder, unspecified: Secondary | ICD-10-CM | POA: Insufficient documentation

## 2018-12-08 HISTORY — DX: Dysphonia: R49.0

## 2018-12-08 HISTORY — DX: Paralysis of vocal cords and larynx, unspecified: J38.00

## 2018-12-08 NOTE — Telephone Encounter (Signed)
Pt called and stated she needs to talk to Dr. Brett Fairy because she is jerking and having tremors in her sleep.

## 2018-12-08 NOTE — Telephone Encounter (Signed)
Called the patient back. Pt states that her boyfriend has recorded her in her sleep and that her symptoms are worsening. She is wanting to come in and talk about what she should do. Advised the patient of opening at 1:30 on 12/12 with check in of 1 pm. Pt accepted apt.

## 2018-12-09 ENCOUNTER — Ambulatory Visit: Payer: Self-pay | Admitting: Neurology

## 2018-12-13 DIAGNOSIS — Z0289 Encounter for other administrative examinations: Secondary | ICD-10-CM

## 2018-12-14 ENCOUNTER — Telehealth: Payer: Self-pay | Admitting: Neurology

## 2018-12-14 ENCOUNTER — Ambulatory Visit: Payer: Self-pay | Admitting: Neurology

## 2018-12-14 NOTE — Telephone Encounter (Signed)
Patient was no show to apt after we just scheduled this on 12/09/18. Patient will have to wait and accept first available for next apt.

## 2018-12-15 ENCOUNTER — Encounter: Payer: Self-pay | Admitting: Neurology

## 2018-12-29 HISTORY — PX: COLONOSCOPY: SHX174

## 2018-12-29 HISTORY — PX: POLYPECTOMY: SHX149

## 2019-01-06 ENCOUNTER — Other Ambulatory Visit: Payer: Self-pay

## 2019-01-06 ENCOUNTER — Emergency Department (HOSPITAL_COMMUNITY): Payer: Medicaid Other

## 2019-01-06 ENCOUNTER — Encounter (HOSPITAL_COMMUNITY): Payer: Self-pay | Admitting: Emergency Medicine

## 2019-01-06 ENCOUNTER — Emergency Department (HOSPITAL_COMMUNITY)
Admission: EM | Admit: 2019-01-06 | Discharge: 2019-01-07 | Disposition: A | Discharge status: Home / Self Care | Payer: Medicaid Other | Attending: Emergency Medicine | Admitting: Emergency Medicine

## 2019-01-06 DIAGNOSIS — F17210 Nicotine dependence, cigarettes, uncomplicated: Secondary | ICD-10-CM | POA: Diagnosis not present

## 2019-01-06 DIAGNOSIS — J45909 Unspecified asthma, uncomplicated: Secondary | ICD-10-CM | POA: Diagnosis not present

## 2019-01-06 DIAGNOSIS — Z79899 Other long term (current) drug therapy: Secondary | ICD-10-CM | POA: Diagnosis not present

## 2019-01-06 DIAGNOSIS — J029 Acute pharyngitis, unspecified: Secondary | ICD-10-CM | POA: Diagnosis present

## 2019-01-06 DIAGNOSIS — E876 Hypokalemia: Secondary | ICD-10-CM

## 2019-01-06 DIAGNOSIS — I10 Essential (primary) hypertension: Secondary | ICD-10-CM | POA: Diagnosis not present

## 2019-01-06 DIAGNOSIS — R0989 Other specified symptoms and signs involving the circulatory and respiratory systems: Secondary | ICD-10-CM | POA: Insufficient documentation

## 2019-01-06 LAB — CBC WITH DIFFERENTIAL/PLATELET
Abs Immature Granulocytes: 0.05 10*3/uL (ref 0.00–0.07)
BASOS ABS: 0 10*3/uL (ref 0.0–0.1)
Basophils Relative: 0 %
Eosinophils Absolute: 0.1 10*3/uL (ref 0.0–0.5)
Eosinophils Relative: 1 %
HCT: 42.9 % (ref 36.0–46.0)
Hemoglobin: 14.2 g/dL (ref 12.0–15.0)
Immature Granulocytes: 1 %
LYMPHS ABS: 4.7 10*3/uL — AB (ref 0.7–4.0)
Lymphocytes Relative: 42 %
MCH: 30.4 pg (ref 26.0–34.0)
MCHC: 33.1 g/dL (ref 30.0–36.0)
MCV: 91.9 fL (ref 80.0–100.0)
MONOS PCT: 7 %
Monocytes Absolute: 0.8 10*3/uL (ref 0.1–1.0)
Neutro Abs: 5.4 10*3/uL (ref 1.7–7.7)
Neutrophils Relative %: 49 %
Platelets: 254 10*3/uL (ref 150–400)
RBC: 4.67 MIL/uL (ref 3.87–5.11)
RDW: 12.6 % (ref 11.5–15.5)
WBC: 11 10*3/uL — ABNORMAL HIGH (ref 4.0–10.5)
nRBC: 0 % (ref 0.0–0.2)

## 2019-01-06 LAB — BASIC METABOLIC PANEL
Anion gap: 10 (ref 5–15)
BUN: 8 mg/dL (ref 6–20)
CALCIUM: 9.8 mg/dL (ref 8.9–10.3)
CO2: 24 mmol/L (ref 22–32)
Chloride: 106 mmol/L (ref 98–111)
Creatinine, Ser: 0.81 mg/dL (ref 0.44–1.00)
GFR calc Af Amer: >60 mL/min (ref >60)
GFR calc non Af Amer: >60 mL/min (ref >60)
Glucose, Bld: 95 mg/dL (ref 70–99)
Potassium: 3.4 mmol/L — ABNORMAL LOW (ref 3.5–5.1)
Sodium: 140 mmol/L (ref 135–145)

## 2019-01-06 LAB — I-STAT BETA HCG BLOOD, ED (MC, WL, AP ONLY): I-stat hCG, quantitative: <5 m[IU]/mL (ref <5)

## 2019-01-06 LAB — I-STAT TROPONIN, ED: Troponin i, poc: 0 ng/mL (ref 0.00–0.08)

## 2019-01-06 MED ORDER — SODIUM CHLORIDE 0.9 % IV BOLUS
500.0000 mL | Freq: Once | INTRAVENOUS | Status: AC
  Administered 2019-01-06: 500 mL via INTRAVENOUS

## 2019-01-06 MED ORDER — DIPHENHYDRAMINE HCL 50 MG/ML IJ SOLN
12.5000 mg | Freq: Once | INTRAMUSCULAR | Status: AC
  Administered 2019-01-06: 12.5 mg via INTRAVENOUS
  Filled 2019-01-06: qty 1

## 2019-01-06 MED ORDER — LORAZEPAM 2 MG/ML IJ SOLN
1.0000 mg | Freq: Once | INTRAMUSCULAR | Status: AC
  Administered 2019-01-06: 1 mg via INTRAVENOUS
  Filled 2019-01-06: qty 1

## 2019-01-06 MED ORDER — METOCLOPRAMIDE HCL 5 MG/ML IJ SOLN
10.0000 mg | Freq: Once | INTRAMUSCULAR | Status: AC
  Administered 2019-01-06: 10 mg via INTRAVENOUS
  Filled 2019-01-06: qty 2

## 2019-01-06 MED ORDER — POTASSIUM CHLORIDE 20 MEQ/15ML (10%) PO SOLN
20.0000 meq | Freq: Once | ORAL | Status: AC
  Administered 2019-01-07: 20 meq via ORAL
  Filled 2019-01-06: qty 15

## 2019-01-06 MED ORDER — LIDOCAINE VISCOUS HCL 2 % MT SOLN
15.0000 mL | Freq: Once | OROMUCOSAL | Status: AC
  Administered 2019-01-06: 15 mL via ORAL
  Filled 2019-01-06 (×2): qty 15

## 2019-01-06 MED ORDER — PANTOPRAZOLE SODIUM 40 MG IV SOLR
40.0000 mg | Freq: Once | INTRAVENOUS | Status: AC
  Administered 2019-01-06: 40 mg via INTRAVENOUS
  Filled 2019-01-06: qty 40

## 2019-01-06 MED ORDER — GLUCAGON HCL RDNA (DIAGNOSTIC) 1 MG IJ SOLR
1.0000 mg | Freq: Once | INTRAMUSCULAR | Status: AC
  Administered 2019-01-06: 1 mg via INTRAVENOUS
  Filled 2019-01-06: qty 1

## 2019-01-06 MED ORDER — ALUM & MAG HYDROXIDE-SIMETH 200-200-20 MG/5ML PO SUSP
30.0000 mL | Freq: Once | ORAL | Status: AC
  Administered 2019-01-06: 30 mL via ORAL
  Filled 2019-01-06 (×2): qty 30

## 2019-01-06 NOTE — Discharge Instructions (Addendum)
Please see the information and instructions below regarding your visit.  Your diagnoses today include:  1. Globus sensation   2. Hypokalemia     Tests performed today include: See side panel of your discharge paperwork for testing performed today. Vital signs are listed at the bottom of these instructions.   Medications prescribed:    Take any prescribed medications only as prescribed, and any over the counter medications only as directed on the packaging.  Home care instructions:  Please follow any educational materials contained in this packet.   Please focus on thin consistency of foods for sustenance.  I also recommend Boost shakes.   Follow-up instructions: Please follow-up with Dr. Joellen Jersey as soon as possible.   Return instructions:  Please return to the Emergency Department if you experience worsening symptoms.  Please return to the emergency department if you have any nausea or vomiting that prevents you from keeping anything down, or worsening symptoms. Please return if you have any other emergent concerns.  Additional Information:   Your vital signs today were: BP (!) 137/93 (BP Location: Right Arm)    Pulse 88    Temp 98 F (36.7 C) (Oral)    Resp 18    Ht 5\' 9"  (1.753 m)    Wt 90.7 kg    LMP 01/06/2019    SpO2 98%    BMI 29.53 kg/m  If your blood pressure (BP) was elevated on multiple readings during this visit above 130 for the top number or above 80 for the bottom number, please have this repeated by your primary care provider within one month. --------------  Thank you for allowing [place] to participate in your care today.

## 2019-01-06 NOTE — ED Provider Notes (Signed)
[facility] DEPT Provider Note   CSN: [id number] Arrival date & time: 01/06/19  1729     History   Chief Complaint Chief Complaint  Patient presents with  . Sore Throat    HPI Raven [name] is a 38 y.o. female.  HPI   Patient is a 38 year old female with a history of achalasia, vocal cord paralysis, dysphasia, cardiomyopathy unspecified ejection fraction 45 to 50% presenting for sensation of globus and worsening dysphasia.  Patient reports that her symptoms have been worsening over the past 4 days.  She said that it seemed to begin after eating a salad and deli meat that seem to be caught in her throat.  She reports that she is followed by Dr. Joellen Jersey of otolaryngology at [facility].  And is scheduled for a "surgery" within the last month, however she reports that she cannot wait that long.  The soonest her provider could see her is in 2 weeks.  Patient denies any upper respiratory symptoms such as congestion, rhinorrhea, or fever.  No cough.  Patient does report that she has some sensation of spasming in her chest.  Patient denies shortness of breath.  Patient denies any nausea, vomiting or regurgitation of food.  Patient reports a history of aspiration pneumonia secondary to her symptoms, but denies any choking within the last 4 days.  Past Medical History:  Diagnosis Date  . Abnormal Pap smear   . Achalasia    esphog stretched several times  . Anxiety   . Arthritis   . Asthma    rarely uses inhaler  . Blood transfusion 01/2006   [place]  . Cardiomyopathy ([place])   . Constipation due to opioid therapy   . Depression   . Dysrhythmia   . Fibromyalgia   . GERD (gastroesophageal reflux disease)    no meds - diet controlled  . Heart murmur   . Hiatal hernia   . History of abnormal Pap smear   . History of chlamydia 1990's  . HSV-2 infection   . HTN (hypertension)   . Occipital neuralgia of right side 04/21/2017  . Pneumonia 05/2013  . PVC's  (premature ventricular contractions)   . Shortness of breath dyspnea   . Status post dilation of esophageal narrowing   . Vaginal Pap smear, abnormal     Patient Active Problem List   Diagnosis Date Noted  . Chronic bilateral low back pain 09/07/2018  . Acute pain of both knees 09/07/2018  . 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 01/18/2018  . Chronically on opiate therapy 01/18/2018  . Excessive daytime sleepiness 01/18/2018  . Vivid dream 01/18/2018  . 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 Occipital neuralgia of right side 04/21/2017  . Fibromyalgia 04/21/2017  . UTI (urinary tract infection) 02/12/2017  . Gallstones and inflammation of gallbladder without obstruction 01/23/2015  . S/P laparoscopic surgery 01/23/2015  . Upper airway cough syndrome 09/05/2014  . Cigarette smoker 08/09/2014  . Dyspnea 08/08/2014  . Abdominal pain, left lower quadrant 03/27/2014  . Cardiomyopathy, idiopathic ([place]) 01/09/2014  . History of chlamydia 01/09/2013  . 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  . History of abnormal Pap smear 01/09/2013  . Achalasia   . Abnormal Pap smear   . Depression   . HSV-2 infection   . Heart palpitations   . Heart murmur   . Anxiety   . GERD (gastroesophageal reflux disease)   . H/O herpes  simplex type 2 infection 05/05/2012  . PVC's (premature ventricular contractions) 05/19/2011  . Palpitation 04/11/2011  . ESOPHAGEAL STRICTURE 08/10/2009  . DYSPHAGIA 08/10/2009  . Esophageal reflux 11/30/2008  . Unspecified constipation 11/30/2008  . DEPRESSION 10/09/2008  . ASTHMA 10/09/2008    Past Surgical History:  Procedure Laterality Date  . ADENOIDECTOMY  12/2002  . BLADDER SUSPENSION  06/11/2012   Procedure: TRANSVAGINAL TAPE (TVT) PROCEDURE;  Surgeon: Delice Lesch, MD;  Location: [facility];  Service: Gynecology;  Laterality: N/A;  Tension Free Vaginal Tape/cystoscopy  .  BREAST SURGERY  07/2001   tumor removed right breast  . CHOLECYSTECTOMY N/A 01/23/2015   Procedure: LAPAROSCOPIC CHOLECYSTECTOMY ;  Surgeon: Fanny Skates, MD;  Location: [facility];  Service: General;  Laterality: N/A;  case booked with cholangiogram but MD did not complete intraoperative cholangiogram   . CYSTOSCOPY  06/11/2012   Procedure: CYSTOSCOPY;  Surgeon: Delice Lesch, MD;  Location: [facility];  Service: Gynecology;  Laterality: N/A;  . DILATION AND CURETTAGE OF UTERUS  1999  . DILATION AND CURETTAGE OF UTERUS  01/2006   retained products  . ESOPHAGOGASTRODUODENOSCOPY     with esophageal dilation x 3  . FOREIGN BODY REMOVAL  06/11/2012   Procedure: REMOVAL FOREIGN BODY EXTREMITY;  Surgeon: Delice Lesch, MD;  Location: [facility];  Service: Gynecology;  Laterality: Right;  Removal of Implanon   . LAPAROSCOPIC TUBAL LIGATION  06/11/2012   Procedure: LAPAROSCOPIC TUBAL LIGATION;  Surgeon: Delice Lesch, MD;  Location: [facility];  Service: Gynecology;  Laterality: Bilateral;  Laparoscopic Bilateral Fulgeration  . TONSILLECTOMY    . TUBAL LIGATION       OB History    Gravida  5   Para  2   Term  2   Preterm  0   AB  2   Living  2     SAB  2   TAB      Ectopic      Multiple      Live Births               Home Medications    Prior to Admission medications   Medication Sig Start Date End Date Taking? Authorizing Provider  clonazePAM (KLONOPIN) 2 MG tablet Take 2 mg by mouth every 8 (eight) hours. 04/02/17  Yes [provider]  FLUoxetine (PROZAC) 20 MG/5ML solution Take 10 mg by mouth daily.  01/13/18  Yes [provider]  lansoprazole (PREVACID SOLUTAB) 30 MG disintegrating tablet Take 1 tablet (30 mg total) by mouth daily. 09/03/18  Yes Armbruster, Carlota Raspberry, MD  LATUDA 60 MG TABS Take 1 tablet by mouth daily.  11/08/18  Yes [provider]  oxyCODONE-acetaminophen (PERCOCET) 10-325 MG tablet Take 1 tablet by mouth 4 (four) times daily.  06/09/16   Yes [provider]  traZODone (DESYREL) 50 MG tablet Take 50 mg by mouth at bedtime.  11/08/18  Yes [provider]  atenolol (TENORMIN) 25 MG tablet Take 25 mg by mouth daily.    [provider]  Capsaicin 0.1 % CREA Apple twice daily as needed Patient not taking: Reported on 12/04/2018 09/03/18   Armbruster, Carlota Raspberry, MD  dicyclomine (BENTYL) 10 MG capsule Take 1-2 every 8 hours as needed Patient taking differently: Take 10 mg by mouth 3 (three) times daily as needed for spasms. Take 1-2 every 8 hours as needed 09/03/18   Armbruster, Carlota Raspberry, MD  hydrOXYzine (ATARAX/VISTARIL) 50 MG tablet Take 50 mg  by mouth every 6 (six) hours as needed for anxiety.  11/08/18   [provider]  metroNIDAZOLE (FLAGYL) 500 MG tablet Take 500 mg by mouth daily as needed (bacterial infection).  11/21/18   [provider]  metroNIDAZOLE (METROGEL VAGINAL) 0.75 % vaginal gel Place 1 Applicatorful vaginally 2 (two) times daily. 02/13/17   Luvenia Redden, PA-C  predniSONE (STERAPRED UNI-PAK 21 TAB) 10 MG (21) TBPK tablet Take as directed Patient not taking: Reported on 12/04/2018 09/07/18   Aundra Dubin, PA-C  pregabalin (LYRICA) 100 MG capsule Take 1 capsule (100 mg total) by mouth 2 (two) times daily. Patient not taking: Reported on 12/04/2018 04/21/17   Kathrynn Ducking, MD  propranolol (INDERAL) 10 MG tablet Take 10 mg by mouth 2 (two) times daily.  11/09/18   [provider]    Family History Family History  Problem Relation Age of Onset  . Heart disease Father   . Hypertension Father   . Stroke Mother   . Hypertension Mother   . Heart attack Mother   . Breast cancer Mother   . Neuropathy Mother   . Arthritis Mother   . Asthma Mother   . Neuropathy Sister   . Arthritis Sister   . Asthma Sister     Social History Social History   Tobacco Use  . Smoking status: Current Some Day Smoker    Packs/day: 0.25    Years: 15.00    Pack years: 3.75     Types: Cigarettes  . Smokeless tobacco: Never Used  . Tobacco comment: HAS CUT BACK  Substance Use Topics  . Alcohol use: No    Comment: on occasion  . Drug use: No     Allergies   Dust mite extract; Other; and Pollen extract   Review of Systems Review of Systems  Constitutional: Negative for chills and fever.  HENT: Positive for sore throat, trouble swallowing and voice change. Negative for congestion, rhinorrhea and sinus pain.   Eyes: Negative for visual disturbance.  Respiratory: Negative for cough, chest tightness and shortness of breath.   Cardiovascular: Negative for chest pain.  Gastrointestinal: Negative for abdominal pain, constipation, diarrhea, nausea and vomiting.  Genitourinary: Negative for dysuria and flank pain.  Musculoskeletal: Negative for back pain and myalgias.  Skin: Negative for rash.  Neurological: Negative for dizziness, syncope, light-headedness and headaches.     Physical Exam Updated Vital Signs BP (!) 137/93 (BP Location: Right Arm)   Pulse 88   Temp 98 F (36.7 C) (Oral)   Resp 18   Ht 5\' 9"  (1.753 m)   Wt 90.7 kg   LMP 01/06/2019   SpO2 98%   BMI 29.53 kg/m   Physical Exam Vitals signs and nursing note reviewed.  Constitutional:      General: She is not in acute distress.    Appearance: She is well-developed.  HENT:     Head: Normocephalic and atraumatic.  Eyes:     Conjunctiva/sclera: Conjunctivae normal.     Pupils: Pupils are equal, round, and reactive to light.  Neck:     Musculoskeletal: Normal range of motion and neck supple.  Cardiovascular:     Rate and Rhythm: Normal rate and regular rhythm.     Heart sounds: S1 normal and S2 normal. No murmur.  Pulmonary:     Effort: Pulmonary effort is normal.     Breath sounds: Normal breath sounds. No wheezing or rales.  Abdominal:     General: There is  no distension.     Palpations: Abdomen is soft.     Tenderness: There is no abdominal tenderness. There is no guarding.    Musculoskeletal: Normal range of motion.        General: No deformity.  Lymphadenopathy:     Cervical: No cervical adenopathy.  Skin:    General: Skin is warm and dry.     Findings: No erythema or rash.  Neurological:     Mental Status: She is alert.     Comments: Cranial nerves grossly intact. Patient moves extremities symmetrically and with good coordination.  Psychiatric:        Behavior: Behavior normal.        Thought Content: Thought content normal.        Judgment: Judgment normal.      ED Treatments / Results  Labs (all labs ordered are listed, but only abnormal results are displayed) Labs Reviewed  CBC WITH DIFFERENTIAL/PLATELET  BASIC METABOLIC PANEL  I-STAT BETA HCG BLOOD, ED ([facility], [facility], [facility] ONLY)  I-STAT TROPONIN, ED    EKG None  Radiology Dg Chest 2 View  Result Date: 01/06/2019 CLINICAL DATA:  Sore throat for 2 days EXAM: CHEST - 2 VIEW COMPARISON:  12/04/2018 FINDINGS: Normal heart size. Lungs clear. No pneumothorax. No pleural effusion. IMPRESSION: No active cardiopulmonary disease. Electronically Signed   By: Marybelle Killings M.D.   On: 01/06/2019 20:07    Procedures Procedures (including critical care time)  Medications Ordered in ED Medications  sodium chloride 0.9 % bolus 500 mL (500 mLs Intravenous New Bag/Given 01/06/19 2025)  metoCLOPramide (REGLAN) injection 10 mg (10 mg Intravenous Given 01/06/19 2030)  diphenhydrAMINE (BENADRYL) injection 12.5 mg (12.5 mg Intravenous Given 01/06/19 2025)  pantoprazole (PROTONIX) injection 40 mg (40 mg Intravenous Given 01/06/19 2032)  glucagon (human recombinant) (GLUCAGEN) injection 1 mg (1 mg Intravenous Given 01/06/19 2036)  LORazepam (ATIVAN) injection 1 mg (1 mg Intravenous Given 01/06/19 2036)     Initial Impression / Assessment and Plan / ED Course  I have reviewed the triage vital signs and the nursing notes.  Pertinent labs & imaging results that were available during my care of the patient were reviewed by me  and considered in my medical decision making (see chart for details).     Patient well-appearing, no acute distress, and is tolerating secretions.  Normal vital signs.  No evidence of volume depletion.  Differential diagnosis includes food bolus, worsening globus, sensation of anxiety.  Work-up demonstrating slight leukocytosis, nonspecific.  Patient has no localizing infectious symptoms.  Patient has slight hypokalemia of 3.4.  Will replete.  Chest x-ray is unremarkable.  Troponin is negative.  EKG without evidence of ischemia, infarction, or arrhythmia.  Patient feeling slightly better after receiving intravenous medications for food bolus.  Patient is reporting that her greatest concern is anxiety over not being able to swallow food and drink.  I discussed at length with the patient appropriate diet changes to accommodate her symptoms.  Recommended avoiding any medially or thick foods and focusing on thin consistency sustenance.  Patient to follow closely with her primary ENT physician.  Patient was given return precautions for any intractable nausea or vomiting, or worsening symptoms.  Patient is in understanding and agrees with the plan of care.  Patient receiving GI cocktail.  Care signed out Charlann Lange, PA-C to reassess.   Final Clinical Impressions(s) / ED Diagnoses   Final diagnoses:  Globus sensation  Hypokalemia  Elevated blood pressure reading with diagnosis of  hypertension    ED Discharge Orders    None       Tamala Julian 01/06/19 2323    Drenda Freeze, MD 01/06/19 2350

## 2019-01-06 NOTE — ED Triage Notes (Addendum)
Patient c/o sore throat x2 days. 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 Reports following up with specialist at [facility]. Patient speaking in full sentences. Maintaining saliva.

## 2019-01-08 ENCOUNTER — Emergency Department (HOSPITAL_COMMUNITY): Payer: Medicaid Other

## 2019-01-08 ENCOUNTER — Emergency Department (HOSPITAL_COMMUNITY)
Admission: EM | Admit: 2019-01-08 | Discharge: 2019-01-08 | Disposition: A | Discharge status: Home / Self Care | Payer: Medicaid Other | Attending: Emergency Medicine | Admitting: Emergency Medicine

## 2019-01-08 ENCOUNTER — Encounter (HOSPITAL_COMMUNITY): Payer: Self-pay | Admitting: Emergency Medicine

## 2019-01-08 DIAGNOSIS — I10 Essential (primary) hypertension: Secondary | ICD-10-CM | POA: Diagnosis not present

## 2019-01-08 DIAGNOSIS — R1310 Dysphagia, unspecified: Secondary | ICD-10-CM | POA: Diagnosis not present

## 2019-01-08 DIAGNOSIS — J45909 Unspecified asthma, uncomplicated: Secondary | ICD-10-CM | POA: Diagnosis not present

## 2019-01-08 DIAGNOSIS — J029 Acute pharyngitis, unspecified: Secondary | ICD-10-CM | POA: Diagnosis present

## 2019-01-08 DIAGNOSIS — F17210 Nicotine dependence, cigarettes, uncomplicated: Secondary | ICD-10-CM | POA: Diagnosis not present

## 2019-01-08 DIAGNOSIS — Z79899 Other long term (current) drug therapy: Secondary | ICD-10-CM | POA: Insufficient documentation

## 2019-01-08 DIAGNOSIS — R1313 Dysphagia, pharyngeal phase: Secondary | ICD-10-CM

## 2019-01-08 LAB — BASIC METABOLIC PANEL
Anion gap: 11 (ref 5–15)
BUN: 9 mg/dL (ref 6–20)
CO2: 19 mmol/L — ABNORMAL LOW (ref 22–32)
Calcium: 9.7 mg/dL (ref 8.9–10.3)
Chloride: 111 mmol/L (ref 98–111)
Creatinine, Ser: 0.81 mg/dL (ref 0.44–1.00)
GFR calc Af Amer: >60 mL/min (ref >60)
GFR calc non Af Amer: >60 mL/min (ref >60)
Glucose, Bld: 124 mg/dL — ABNORMAL HIGH (ref 70–99)
Potassium: 3.8 mmol/L (ref 3.5–5.1)
Sodium: 141 mmol/L (ref 135–145)

## 2019-01-08 LAB — CBC WITH DIFFERENTIAL/PLATELET
Abs Immature Granulocytes: 0.04 10*3/uL (ref 0.00–0.07)
Basophils Absolute: 0 10*3/uL (ref 0.0–0.1)
Basophils Relative: 0 %
Eosinophils Absolute: 0 10*3/uL (ref 0.0–0.5)
Eosinophils Relative: 0 %
HCT: 40.7 % (ref 36.0–46.0)
Hemoglobin: 13.8 g/dL (ref 12.0–15.0)
Immature Granulocytes: 0 %
Lymphocytes Relative: 28 %
Lymphs Abs: 2.6 10*3/uL (ref 0.7–4.0)
MCH: 30.3 pg (ref 26.0–34.0)
MCHC: 33.9 g/dL (ref 30.0–36.0)
MCV: 89.3 fL (ref 80.0–100.0)
Monocytes Absolute: 0.6 10*3/uL (ref 0.1–1.0)
Monocytes Relative: 6 %
Neutro Abs: 5.9 10*3/uL (ref 1.7–7.7)
Neutrophils Relative %: 66 %
Platelets: 257 10*3/uL (ref 150–400)
RBC: 4.56 MIL/uL (ref 3.87–5.11)
RDW: 12.4 % (ref 11.5–15.5)
WBC: 9.2 10*3/uL (ref 4.0–10.5)
nRBC: 0 % (ref 0.0–0.2)

## 2019-01-08 MED ORDER — GLUCAGON HCL RDNA (DIAGNOSTIC) 1 MG IJ SOLR
1.0000 mg | Freq: Once | INTRAMUSCULAR | Status: AC
  Administered 2019-01-08: 1 mg via INTRAVENOUS
  Filled 2019-01-08: qty 1

## 2019-01-08 MED ORDER — LIDOCAINE VISCOUS HCL 2 % MT SOLN: 15.0000 mL | OROMUCOSAL | 0 refills | Status: DC | PRN

## 2019-01-08 MED ORDER — ALUM & MAG HYDROXIDE-SIMETH 200-200-20 MG/5ML PO SUSP
30.0000 mL | Freq: Once | ORAL | Status: AC
  Administered 2019-01-08: 30 mL via ORAL
  Filled 2019-01-08: qty 30

## 2019-01-08 MED ORDER — LORAZEPAM 2 MG/ML IJ SOLN
1.0000 mg | Freq: Once | INTRAMUSCULAR | Status: AC
  Administered 2019-01-08: 1 mg via INTRAVENOUS
  Filled 2019-01-08: qty 1

## 2019-01-08 MED ORDER — LIDOCAINE VISCOUS HCL 2 % MT SOLN
15.0000 mL | Freq: Once | OROMUCOSAL | Status: AC
  Administered 2019-01-08: 15 mL via ORAL
  Filled 2019-01-08: qty 15

## 2019-01-08 MED ORDER — FAMOTIDINE IN NACL 20-0.9 MG/50ML-% IV SOLN
20.0000 mg | Freq: Once | INTRAVENOUS | Status: AC
  Administered 2019-01-08: 20 mg via INTRAVENOUS
  Filled 2019-01-08: qty 50

## 2019-01-08 MED ORDER — SODIUM CHLORIDE 0.9 % IV BOLUS
500.0000 mL | Freq: Once | INTRAVENOUS | Status: AC
  Administered 2019-01-08: 500 mL via INTRAVENOUS

## 2019-01-08 NOTE — Discharge Instructions (Signed)
You can start using the viscous lidocaine solution as needed for difficulty swallowing/pain with swallowing.  Gargle this and then spit it out.  Do not swallow it.  Eat a diet of soft and liquid foods.  Follow-up with your ENT or gastroenterologist for reevaluation of your symptoms.  Return to the emergency department if any concerning signs or symptoms develop

## 2019-01-08 NOTE — ED Notes (Signed)
Pt states that face is numb, reports arm numbness as well.

## 2019-01-08 NOTE — ED Notes (Signed)
Patient transported to X-ray 

## 2019-01-08 NOTE — ED Notes (Signed)
Pt reports chest pain, difficulty breathing, and feeling as if she's about to "fall out". Pt unable to relax preventing bp from being collected. Placed on 12-lead monitoring

## 2019-01-08 NOTE — ED Triage Notes (Signed)
Pt c/o sore throat and "think the flap in back of my throat is swollen". I was here yesterday for same thing and was given medications. I am having trouble sleeping due to not able to breath.

## 2019-01-08 NOTE — ED Provider Notes (Signed)
  Face-to-face evaluation   History: She presents for evaluation of persistent difficulty swallowing, with sensation of shortness of breath.  She states she is due to have vocal cord surgery, because of paralysis.  She denies fever, chills, dizziness.  She has trouble eating because it hurts when she swallows and she feels like she cannot get food down.  Physical exam: Alert, calm, cooperative.  No dysarthria or aphasia.  No stridor.  Lungs clear anteriorly posteriorly.  Heart regular rate and rhythm without murmur.  Medical screening examination/treatment/procedure(s) were conducted as a shared visit with non-physician practitioner(s) and myself.  I personally evaluated the patient during the encounter   Daleen Bo, MD 01/08/19 1727

## 2019-01-08 NOTE — ED Provider Notes (Signed)
[facility] DEPT Provider Note   CSN: [id number] Arrival date & time: 01/08/19  0901     History   Chief Complaint Chief Complaint  Patient presents with  . Sore Throat  . Hyperventilating    HPI Raven [name] is a 38 y.o. female with history of vocal cord paresis, muscle tension dysphonia, anxiety, achalasia, cardiomyopathy, fibromyalgia, GERD, hiatal hernia presents for evaluation of ongoing intermittent globus sensation.  She is currently being followed by Dr. Joellen Jersey with ENT at [facility] for this as well as low-power gastroenterology.  She reports that there is a plan to perform surgery but she cannot wait any longer.  She was seen and evaluated in the ED on 01/06/2019 for the symptoms with improvement and was found to be stable for discharge home.  Last night at around 7 PM she began to eat some chicken noodle soup which she reported she was able to tolerate without difficulty at the time but around 10 PM she began to feel as though there was something stuck in her throat and a sharp pain to the anterior aspect of the neck.  She states "my epiglottis is closing up".  She notes dysphagia and odynophagia.  Denies fevers.  She notes that when her symptoms worsen episodically she will experience some shortness of breath and generalized chest pains and began to feel lightheaded and experienced numbness into her extremities.  She does note that she has a history of anxiety and panic attacks for which she takes Klonopin.  She appears extremely anxious at this time.  The history is provided by the patient.    Past Medical History:  Diagnosis Date  . Abnormal Pap smear   . Achalasia    esphog stretched several times  . Anxiety   . Arthritis   . Asthma    rarely uses inhaler  . Blood transfusion 01/2006   [place]  . Cardiomyopathy ([place])   . Constipation due to opioid therapy   . Depression   . Dysrhythmia   . Fibromyalgia   . GERD (gastroesophageal  reflux disease)    no meds - diet controlled  . Heart murmur   . Hiatal hernia   . History of abnormal Pap smear   . History of chlamydia 1990's  . HSV-2 infection   . HTN (hypertension)   . Occipital neuralgia of right side 04/21/2017  . Pneumonia 05/2013  . PVC's (premature ventricular contractions)   . Shortness of breath dyspnea   . Status post dilation of esophageal narrowing   . Vaginal Pap smear, abnormal     Patient Active Problem List   Diagnosis Date Noted  . Chronic bilateral low back pain 09/07/2018  . Acute pain of both knees 09/07/2018  . 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 01/18/2018  . Chronically on opiate therapy 01/18/2018  . Excessive daytime sleepiness 01/18/2018  . Vivid dream 01/18/2018  . 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 Occipital neuralgia of right side 04/21/2017  . Fibromyalgia 04/21/2017  . UTI (urinary tract infection) 02/12/2017  . Gallstones and inflammation of gallbladder without obstruction 01/23/2015  . S/P laparoscopic surgery 01/23/2015  . Upper airway cough syndrome 09/05/2014  . Cigarette smoker 08/09/2014  . Dyspnea 08/08/2014  . Abdominal pain, left lower quadrant 03/27/2014  . Cardiomyopathy, idiopathic ([place]) 01/09/2014  . History of chlamydia 01/09/2013  . 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  . History of abnormal  Pap smear 01/09/2013  . Achalasia   . Abnormal Pap smear   . Depression   . HSV-2 infection   . Heart palpitations   . Heart murmur   . Anxiety   . GERD (gastroesophageal reflux disease)   . 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 PVC's (premature ventricular contractions) 05/19/2011  . Palpitation 04/11/2011  . ESOPHAGEAL STRICTURE 08/10/2009  . DYSPHAGIA 08/10/2009  . Esophageal reflux 11/30/2008  . Unspecified constipation 11/30/2008  . DEPRESSION 10/09/2008  . ASTHMA 10/09/2008    Past Surgical  History:  Procedure Laterality Date  . ADENOIDECTOMY  12/2002  . BLADDER SUSPENSION  06/11/2012   Procedure: TRANSVAGINAL TAPE (TVT) PROCEDURE;  Surgeon: Delice Lesch, MD;  Location: [facility];  Service: Gynecology;  Laterality: N/A;  Tension Free Vaginal Tape/cystoscopy  . BREAST SURGERY  07/2001   tumor removed right breast  . CHOLECYSTECTOMY N/A 01/23/2015   Procedure: LAPAROSCOPIC CHOLECYSTECTOMY ;  Surgeon: Fanny Skates, MD;  Location: [place];  Service: General;  Laterality: N/A;  case booked with cholangiogram but MD did not complete intraoperative cholangiogram   . CYSTOSCOPY  06/11/2012   Procedure: CYSTOSCOPY;  Surgeon: Delice Lesch, MD;  Location: [facility];  Service: Gynecology;  Laterality: N/A;  . DILATION AND CURETTAGE OF UTERUS  1999  . DILATION AND CURETTAGE OF UTERUS  01/2006   retained products  . ESOPHAGOGASTRODUODENOSCOPY     with esophageal dilation x 3  . FOREIGN BODY REMOVAL  06/11/2012   Procedure: REMOVAL FOREIGN BODY EXTREMITY;  Surgeon: Delice Lesch, MD;  Location: [facility];  Service: Gynecology;  Laterality: Right;  Removal of Implanon   . LAPAROSCOPIC TUBAL LIGATION  06/11/2012   Procedure: LAPAROSCOPIC TUBAL LIGATION;  Surgeon: Delice Lesch, MD;  Location: [facility];  Service: Gynecology;  Laterality: Bilateral;  Laparoscopic Bilateral Fulgeration  . TONSILLECTOMY    . TUBAL LIGATION       OB History    Gravida  5   Para  2   Term  2   Preterm  0   AB  2   Living  2     SAB  2   TAB      Ectopic      Multiple      Live Births               Home Medications    Prior to Admission medications   Medication Sig Start Date End Date Taking? Authorizing Provider  atenolol (TENORMIN) 25 MG tablet Take 25 mg by mouth daily.   Yes [provider]  clonazePAM (KLONOPIN) 2 MG tablet Take 2 mg by mouth every 8 (eight) hours. 04/02/17  Yes [provider]  FLUoxetine (PROZAC) 20 MG/5ML solution Take 10 mg by mouth daily.   01/13/18  Yes [provider]  lansoprazole (PREVACID SOLUTAB) 30 MG disintegrating tablet Take 1 tablet (30 mg total) by mouth daily. 09/03/18  Yes Armbruster, Carlota Raspberry, MD  LATUDA 60 MG TABS Take 1 tablet by mouth daily.  11/08/18  Yes [provider]  oxyCODONE-acetaminophen (PERCOCET) 10-325 MG tablet Take 1 tablet by mouth 4 (four) times daily.  06/09/16  Yes [provider]  propranolol (INDERAL) 10 MG tablet Take 10 mg by mouth 2 (two) times daily.  11/09/18  Yes [provider]  traZODone (DESYREL) 50 MG tablet Take 50 mg by mouth at bedtime.  11/08/18  Yes [provider]  Capsaicin 0.1 % CREA Apple  twice daily as needed Patient not taking: Reported on 12/04/2018 09/03/18   Armbruster, Carlota Raspberry, MD  dicyclomine (BENTYL) 10 MG capsule Take 1-2 every 8 hours as needed Patient taking differently: Take 10 mg by mouth 3 (three) times daily as needed for spasms. Take 1-2 every 8 hours as needed 09/03/18   Armbruster, Carlota Raspberry, MD  hydrOXYzine (ATARAX/VISTARIL) 50 MG tablet Take 50 mg by mouth every 6 (six) hours as needed for anxiety.  11/08/18   [provider]  lidocaine (XYLOCAINE) 2 % solution Use as directed 15 mLs in the mouth or throat every 4 (four) hours as needed for mouth pain. Swish and spit 01/08/19   Jaren Vanetten A, PA-C  metroNIDAZOLE (FLAGYL) 500 MG tablet Take 500 mg by mouth daily as needed (bacterial infection).  11/21/18   [provider]  metroNIDAZOLE (METROGEL VAGINAL) 0.75 % vaginal gel Place 1 Applicatorful vaginally 2 (two) times daily. 02/13/17   Luvenia Redden, PA-C  predniSONE (STERAPRED UNI-PAK 21 TAB) 10 MG (21) TBPK tablet Take as directed Patient not taking: Reported on 12/04/2018 09/07/18   Aundra Dubin, PA-C  pregabalin (LYRICA) 100 MG capsule Take 1 capsule (100 mg total) by mouth 2 (two) times daily. Patient not taking: Reported on 12/04/2018 04/21/17   Kathrynn Ducking, MD    Family History Family  History  Problem Relation Age of Onset  . Heart disease Father   . Hypertension Father   . Stroke Mother   . Hypertension Mother   . Heart attack Mother   . Breast cancer Mother   . Neuropathy Mother   . Arthritis Mother   . Asthma Mother   . Neuropathy Sister   . Arthritis Sister   . Asthma Sister     Social History Social History   Tobacco Use  . Smoking status: Current Some Day Smoker    Packs/day: 0.25    Years: 15.00    Pack years: 3.75    Types: Cigarettes  . Smokeless tobacco: Never Used  . Tobacco comment: HAS CUT BACK  Substance Use Topics  . Alcohol use: No    Comment: on occasion  . Drug use: No     Allergies   Dust mite extract; Other; and Pollen extract   Review of Systems Review of Systems  Constitutional: Negative for chills and fever.  HENT: Positive for trouble swallowing.   Respiratory: Positive for shortness of breath.   Neurological: Positive for light-headedness.  All other systems reviewed and are negative.    Physical Exam Updated Vital Signs BP 135/89   Pulse 81   Temp 98 F (36.7 C) (Oral)   Resp (!) 23   LMP 01/06/2019   SpO2 100%   Physical Exam Vitals signs and nursing note reviewed.  Constitutional:      General: She is not in acute distress.    Appearance: She is well-developed.     Comments: Appears extremely anxious, tearful  HENT:     Head: Normocephalic and atraumatic.     Mouth/Throat:     Pharynx: No oropharyngeal exudate, posterior oropharyngeal erythema or uvula swelling.     Tonsils: No tonsillar exudate or tonsillar abscesses.     Comments: No trismus or sublingual abnormalities.  No uvular deviation.  Tolerating secretions without difficulty.  No sublingual abnormalities.  Phonating well. Eyes:     General:        Right eye: No discharge.        Left eye: No  discharge.     Conjunctiva/sclera: Conjunctivae normal.  Neck:     Musculoskeletal: Normal range of motion and neck supple.     Vascular: No  JVD.     Trachea: No tracheal deviation.  Cardiovascular:     Rate and Rhythm: Normal rate.     Heart sounds: Normal heart sounds.  Pulmonary:     Effort: Pulmonary effort is normal.     Breath sounds: Normal breath sounds.  Abdominal:     General: There is no distension.     Palpations: Abdomen is soft.     Tenderness: There is no abdominal tenderness. There is no guarding.  Skin:    General: Skin is warm and dry.     Findings: No erythema.  Neurological:     Mental Status: She is alert.  Psychiatric:        Behavior: Behavior normal.      ED Treatments / Results  Labs (all labs ordered are listed, but only abnormal results are displayed) Labs Reviewed  BASIC METABOLIC PANEL - Abnormal; Notable for the following components:      Result Value   CO2 19 (*)    Glucose, Bld 124 (*)    All other components within normal limits  CBC WITH DIFFERENTIAL/PLATELET    EKG None  Radiology Dg Neck Soft Tissue  Result Date: 01/08/2019 CLINICAL DATA:  Pt c/o sore throat and stated "I think the flap in the back of my throat is swollen". Pt stated she was here yesterday for the same thing and was given medications. Pt stated she is having trouble sleeping due to not able to breath. EXAM: NECK SOFT TISSUES - 1+ VIEW COMPARISON:  None. FINDINGS: There is no evidence of retropharyngeal soft tissue swelling or epiglottic enlargement. The cervical airway is unremarkable and no radio-opaque foreign body identified. IMPRESSION: Negative. Electronically Signed   By: Lucrezia Europe M.D.   On: 01/08/2019 12:51   Dg Chest 2 View  Result Date: 01/06/2019 CLINICAL DATA:  Sore throat for 2 days EXAM: CHEST - 2 VIEW COMPARISON:  12/04/2018 FINDINGS: Normal heart size. Lungs clear. No pneumothorax. No pleural effusion. IMPRESSION: No active cardiopulmonary disease. Electronically Signed   By: Marybelle Killings M.D.   On: 01/06/2019 20:07    Procedures Procedures (including critical care time)  Medications  Ordered in ED Medications  alum & mag hydroxide-simeth (MAALOX/MYLANTA) 200-200-20 MG/5ML suspension 30 mL (30 mLs Oral Given 01/08/19 1023)    And  lidocaine (XYLOCAINE) 2 % viscous mouth solution 15 mL (15 mLs Oral Given 01/08/19 1023)  LORazepam (ATIVAN) injection 1 mg (1 mg Intravenous Given 01/08/19 1024)  glucagon (human recombinant) (GLUCAGEN) injection 1 mg (1 mg Intravenous Given 01/08/19 1024)  famotidine (PEPCID) IVPB 20 mg premix (0 mg Intravenous Stopped 01/08/19 1108)  sodium chloride 0.9 % bolus 500 mL (0 mLs Intravenous Stopped 01/08/19 1221)     Initial Impression / Assessment and Plan / ED Course  I have reviewed the triage vital signs and the nursing notes.  Pertinent labs & imaging results that were available during my care of the patient were reviewed by me and considered in my medical decision making (see chart for details).     Patient presenting with ongoing dysphagia/odynophagia.  She has known vocal cord paresis, muscle tension dysphonia, achalasia, and anxiety which is being followed by her ENT and gastroenterologist.  She is afebrile, initially tachypneic and quite anxious in appearance with significant improvement on reevaluation.  She is nontoxic  in appearance.  She is complaining of generalized numbness and tingling and difficulty breathing and swallowing which she reports is consistent with her usual anxiety and panic attacks.  I have a low suspicion of ACS/MI, PE, or other life-threatening cardiopulmonary abnormality contributing to her symptoms.  No evidence of CVA.  Chest x-ray performed 2 days ago shows no acute cardiopulmonary abnormalities.  Radiographs of the neck today show no acute abnormalities with no evidence of epiglottic enlargement.  She is tolerating secretions without difficulty.  She was given medications to manage her symptoms with significant improvement.  On reevaluation resting comfortably in no apparent distress.  She appears relaxed and  cooperative.  She is tolerating sips of fluid without difficulty.  Lab work reviewed by me shows no acute changes and is reassuring.  We will give viscous lidocaine to gargle and spit out for symptomatic relief at home.  Recommend follow-up with ENT and gastroenterologist for reevaluation.  Discussed strict ED return precautions Pt verbalized understanding of and agreement with plan and is safe for discharge home at this time.  Patient seen and evaluate Dr. Eulis Foster who agrees with assessment and plan at this time.  Final Clinical Impressions(s) / ED Diagnoses   Final diagnoses:  Pharyngeal dysphagia    ED Discharge Orders         Ordered    lidocaine (XYLOCAINE) 2 % solution  Every 4 hours PRN     01/08/19 1340           Darris Staiger, Hendricks A, PA-C 01/08/19 1345    Daleen Bo, MD 01/08/19 1727

## 2019-01-12 ENCOUNTER — Ambulatory Visit (INDEPENDENT_AMBULATORY_CARE_PROVIDER_SITE_OTHER): Payer: Medicaid Other | Admitting: Physician Assistant

## 2019-01-12 ENCOUNTER — Encounter: Payer: Self-pay | Admitting: Physician Assistant

## 2019-01-12 ENCOUNTER — Telehealth: Payer: Self-pay

## 2019-01-12 VITALS — BP 158/90 | HR 90 | Ht 69.0 in | Wt 207.0 lb

## 2019-01-12 DIAGNOSIS — K224 Dyskinesia of esophagus: Secondary | ICD-10-CM

## 2019-01-12 DIAGNOSIS — R05 Cough: Secondary | ICD-10-CM

## 2019-01-12 DIAGNOSIS — J3800 Paralysis of vocal cords and larynx, unspecified: Secondary | ICD-10-CM | POA: Diagnosis not present

## 2019-01-12 DIAGNOSIS — R1310 Dysphagia, unspecified: Secondary | ICD-10-CM | POA: Diagnosis not present

## 2019-01-12 DIAGNOSIS — R059 Cough, unspecified: Secondary | ICD-10-CM

## 2019-01-12 NOTE — Patient Instructions (Addendum)
You have been scheduled for a Barium Esophogram at [facility]iology (1st floor of the hospital) on 01-20-19 at 9:30 am. Please arrive at 9:15 am to your appointment for registration. Make certain not to have anything to eat or drink 3 hours prior to your test. If you need to reschedule for any reason, please contact radiology at [phone] to do so.   Full liquid diet to very soft diet.  Drink ;Ensure or Boost 3 times daily. Sleep upright 45 to 60 degrees.  __________________________________________________________________ A barium swallow is an examination that concentrates on views of the esophagus. This tends to be a double contrast exam (barium and two liquids which, when combined, create a gas to distend the wall of the oesophagus) or single contrast (non-ionic iodine based). The study is usually tailored to your symptoms so a good history is essential. Attention is paid during the study to the form, structure and configuration of the esophagus, looking for functional disorders (such as aspiration, dysphagia, achalasia, motility and reflux) EXAMINATION You may be asked to change into a gown, depending on the type of swallow being performed. A radiologist and radiographer will perform the procedure. The radiologist will advise you of the type of contrast selected for your procedure and direct you during the exam. You will be asked to stand, sit or lie in several different positions and to hold a small amount of fluid in your mouth before being asked to swallow while the imaging is performed .In some instances you may be asked to swallow barium coated marshmallows to assess the motility of a solid food bolus. The exam can be recorded as a digital or video fluoroscopy procedure. POST PROCEDURE It will take 1-2 days for the barium to pass through your system. To facilitate this, it is important, unless otherwise directed, to increase your fluids for the next 24-48hrs and to resume your normal  diet.  This test typically takes about 30 minutes to perform. __________________________________________________________________________________     Raven [name] have been scheduled for an Esophageal Manometry  at [facility] on  02-09-19 at 12:30 PM . Please arrive 30 minutes prior to your procedure for registration. You will need to go to outpatient registration (1st floor of the hospital) first. Make certain to bring your insurance cards as well as a complete list of medications.  Please remember the following:  1) Do not take any muscle relaxants, xanax (alprazolam) or ativan for 1 day prior to your test as well as the day of the test.  2) Nothing to eat or drink after 12:00 midnight on the night before your test.  3) Hold all diabetic medications/insulin the morning of the test. You may eat and take your medications after the test.  4) For 7 days prior to your test, do not take: Reglan, Tagamet, Zantac, Phenergan, Axid or Pepcid.  5) You MAY use an antacid such as Rolaids or Tums up to 12 hours prior to your test.    ------------------------------------------------------------------------------------------- ABOUT ESOPHAGEAL MANOMETRY Esophageal manometry (muh-NOM-uh-tree) is a test that gauges how well your esophagus works. Your esophagus is the long, muscular tube that connects your throat to your stomach. Esophageal manometry measures the rhythmic muscle contractions (peristalsis) that occur in your esophagus when you swallow. Esophageal manometry also measures the coordination and force exerted by the muscles of your esophagus.  During esophageal manometry, a thin, flexible tube (catheter) that contains sensors is passed through your nose, down your esophagus and into your stomach. Esophageal manometry  can be helpful in diagnosing some mostly uncommon disorders that affect your esophagus.  Why it's done Esophageal manometry is used to evaluate the movement (motility) of  food through the esophagus and into the stomach. The test measures how well the circular bands of muscle (sphincters) at the top and bottom of your esophagus open and close, as well as the pressure, strength and pattern of the wave of esophageal muscle contractions that moves food along.  What you can expect Esophageal manometry is an outpatient procedure done without sedation. Most people tolerate it well. You may be asked to change into a hospital gown before the test starts.  During esophageal manometry  . While you are sitting up, a member of your health care team sprays your throat with a numbing medication or puts numbing gel in your nose or both.  . A catheter is guided through your nose into your esophagus. The catheter may be sheathed in a water-filled sleeve. It doesn't interfere with your breathing. However, your eyes may water, and you may gag. You may have a slight nosebleed from irritation.  . After the catheter is in place, you may be asked to lie on your back on an exam table, or you may be asked to remain seated.  . You then swallow small sips of water. As you do, a computer connected to the catheter records the pressure, strength and pattern of your esophageal muscle contractions.  . During the test, you'll be asked to breathe slowly and smoothly, remain as still as possible, and swallow only when you're asked to do so.  . A member of your health care team may move the catheter down into your stomach while the catheter continues its measurements.  . The catheter then is slowly withdrawn.  This test typically takes 30-45 minutes to complete.  ---------------------------------------------------------------------------------------------- ABOUT 24 HOUR PH PROBE An esophageal pH test measures and records the pH in your esophagus to determine if you have gastroesophageal reflux disease (GERD). The test can also be done to determine the effectiveness of medications or surgical treatment  for GERD. What is esophageal reflux? Esophageal reflux is a condition in which stomach acid refluxes or moves back into the esophagus (the "food pipe" leading from the mouth to the stomach). How does the esophageal pH test work? A thin, small tube with an acid sensing device on the tip is gently passed through your nose, down the esophagus ("food tube"), and positioned about 2 inches above the lower esophageal sphincter. The tube is secured to the side of your face with clear tape. The end of the tube exiting from your nose is attached to a portable recorder that is worn on your belt or over your shoulder. The recorder has several buttons on it that you will press to mark certain events. A nurse will review the monitoring instructions with you. Once the test has begun, what do I need to know and do? Marland Kitchen Activity: Follow your usual daily routine. Do not reduce or change your activities during the monitoring period. Doing so can make the monitoring results less useful.  . Note: do not take a tub bath or shower; the equipment can't get wet.  . Eating: Eat your regular meals at the usual times. If you do not eat during the monitoring period, your stomach will not produce acid as usual, and the test results will not be accurate. Eat at least 2 meals a day. Eat foods that tend to increase your symptoms (without making  yourself miserable). Avoid snacking. Do not suck on hard candy or lozenges and do not chew gum during the monitoring period.  . Lying down: Remain upright throughout the day. Do not lie down until you go to bed (unless napping or lying down during the day is part of your daily routine).  . Medications: Continue to follow your doctor's advice regarding medications to avoid during the monitoring period.  . Recording symptoms: Press the appropriate button on your recorder when symptoms occur (as discussed with the nurse).  . Recording events: Record the time you start and stop eating and drinking  (anything other than plain water). Record the time you lie down (even if just resting) and when you get back up. The nurse will explain this.  . Unusual symptoms or side effects. If you think you may be experiencing any unusual symptoms or side effects, call your doctor.  You will return the next day to have the tube removed. The information on the recorder will be downloaded to a computer and the results will be analyzed.  After completion of the study Resume your normal diet and medications. Lozenges or hard candy may help ease any sore throat caused by the tube.   It will take at least 2 weeks to receive the results of this test from your physician.

## 2019-01-12 NOTE — Telephone Encounter (Signed)
-----   Message from Alfredia Ferguson, PA-C sent at 01/12/2019  2:16 PM EST ----- Regarding: appt at [facility], pt was seen by ENT at [facility] 11/2018 Dr Joellen Jersey -  she needs follow up appt ASAP - sxs have progressed - has vocal cord paresis , want sto go ahead with vocal cord injections. Please call there and  facilitate an appt

## 2019-01-12 NOTE — Progress Notes (Signed)
Agree with assessment and plan as outlined.  

## 2019-01-12 NOTE — Progress Notes (Signed)
Subjective:    Patient ID: Raven [name], female    DOB: 1981/02/11, 38 y.o.   MRN: [id number]  HPI [name] is a pleasant 38 year old African-American female, known to Dr. Havery Moros who was initially evaluated by Dr. Havery Moros in September 2019.  She had been seen previously by Dr. Deatra Ina with longstanding issues of dysphasia.  She comes in today with worsening complaints of solid and liquid dysphasia and episodes of coughing and choking.   She has had extensive evaluation over the past several years.  She relates having prior endoscopies with Dr. Deatra Ina and Botox injection which did seem to help her symptoms a lot for several months.  Patient has also been evaluated at [facility].  Most recent EGD was done at [facility] in 2012, and was normal other than hiatal hernia.  EGD per Dr. Deatra Ina in April 2015 was unremarkable.  She also had colonoscopy in 2015 with removal of 2 polyps, 1 of which was a tubular adenoma and also noted to have moderate sigmoid diverticulosis. She had esophageal manometry done in 2016 at [facility], report is not visible.  There was a modified barium swallow done with speech pathology at [facility] in January 2019 which was interpreted as normal as was CT of her neck in December 2018.  She had a regular barium swallow done in 2015 at [facility] which showed some delayed clearance of bolus from the upper thoracic esophagus with infrequent secondary peristalsis. She also carries a diagnosis of vocal cord paresis.  She had most recently been seen by a Dr. Sigurd Sos at [facility] December 2019.  On laryngoscopy it was noted that her vocal cords were not mobile noted the right vocal cord appeared to be paretic.  He also mentioned muscle tension dysphonia.  It was suggested that she try compensatory measures and if she did not have improvement to then discuss vocal cord injection with Dr. Lamar Benes. Patient says her symptoms have worsened since that time and she has been trying to get in  touch with ENT at [facility] to get an appointment to go ahead with a vocal cord injection but has been unsuccessful. She has had 2 recent ER visits with complaints of dysphasia and tightness in her throat which makes her feel panicky. She has continued on Prevacid Solutab's is unable to swallow any pills.  She has been losing weight over the past 3 weeks due to increase in symptoms.  She describes a scraping type feeling with swallowing noted in her throat and says at times she feels as if food is getting down into her airway which causes her to cough.  She is unable to lay on her left side at night because this feels as if something gets pushed to the right making it hard for her to breathe. She admits to having good days and bad days on good days liquids will go down fairly well and on bad days even liquids will sit in her upper throat and she feels wet and strangely all day.  She has not been eating much solid food over the past few weeks because she is scared to.  She says sometimes it feels as if food sits in her esophagus even overnight.  Review of Systems Pertinent positive and negative review of systems were noted in the above HPI section.  All other review of systems was otherwise negative.  Outpatient Encounter Medications as of 01/12/2019  Medication Sig  . atenolol (TENORMIN) 25 MG tablet Take 25 mg by mouth daily.  Marland Kitchen  Capsaicin 0.1 % CREA Apple twice daily as needed  . clonazePAM (KLONOPIN) 2 MG tablet Take 2 mg by mouth every 8 (eight) hours.  Marland Kitchen dicyclomine (BENTYL) 10 MG capsule Take 1-2 every 8 hours as needed (Patient taking differently: Take 10 mg by mouth 3 (three) times daily as needed for spasms. Take 1-2 every 8 hours as needed)  . FLUoxetine (PROZAC) 20 MG/5ML solution Take 10 mg by mouth daily.   . hydrOXYzine (ATARAX/VISTARIL) 50 MG tablet Take 50 mg by mouth every 6 (six) hours as needed for anxiety.   . lansoprazole (PREVACID SOLUTAB) 30 MG disintegrating tablet Take 1 tablet  (30 mg total) by mouth daily.  Marland Kitchen LATUDA 60 MG TABS Take 1 tablet by mouth daily.   Marland Kitchen lidocaine (XYLOCAINE) 2 % solution Use as directed 15 mLs in the mouth or throat every 4 (four) hours as needed for mouth pain. Swish and spit  . metroNIDAZOLE (FLAGYL) 500 MG tablet Take 500 mg by mouth daily as needed (bacterial infection).   . metroNIDAZOLE (METROGEL VAGINAL) 0.75 % vaginal gel Place 1 Applicatorful vaginally 2 (two) times daily.  Marland Kitchen oxyCODONE-acetaminophen (PERCOCET) 10-325 MG tablet Take 1 tablet by mouth 4 (four) times daily.   . propranolol (INDERAL) 10 MG tablet Take 10 mg by mouth 2 (two) times daily.   . traZODone (DESYREL) 50 MG tablet Take 50 mg by mouth at bedtime.   . [DISCONTINUED] predniSONE (STERAPRED UNI-PAK 21 TAB) 10 MG (21) TBPK tablet Take as directed (Patient not taking: Reported on 12/04/2018)  . [DISCONTINUED] pregabalin (LYRICA) 100 MG capsule Take 1 capsule (100 mg total) by mouth 2 (two) times daily. (Patient not taking: Reported on 12/04/2018)   No facility-administered encounter medications on file as of 01/12/2019.    Allergies  Allergen Reactions  . Dust Mite Extract Shortness Of Breath  . Other Shortness Of Breath    Feathers, trees "almost everything outside"  . Pollen Extract Shortness Of Breath   Patient Active Problem List   Diagnosis Date Noted  . Chronic bilateral low back pain 09/07/2018  . Acute pain of both knees 09/07/2018  . 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 01/18/2018  . Chronically on opiate therapy 01/18/2018  . Excessive daytime sleepiness 01/18/2018  . Vivid dream 01/18/2018  . 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 Occipital neuralgia of right side 04/21/2017  . Fibromyalgia 04/21/2017  . UTI (urinary tract infection) 02/12/2017  . Gallstones and inflammation of gallbladder without obstruction 01/23/2015  . S/P laparoscopic surgery  01/23/2015  . Upper airway cough syndrome 09/05/2014  . Cigarette smoker 08/09/2014  . Dyspnea 08/08/2014  . Abdominal pain, left lower quadrant 03/27/2014  . Cardiomyopathy, idiopathic ([place]) 01/09/2014  . History of chlamydia 01/09/2013  . 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  . History of abnormal Pap smear 01/09/2013  . Achalasia   . Abnormal Pap smear   . Depression   . HSV-2 infection   . Heart palpitations   . Heart murmur   . Anxiety   . GERD (gastroesophageal reflux disease)   . 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 PVC's (premature ventricular contractions) 05/19/2011  . Palpitation 04/11/2011  . ESOPHAGEAL STRICTURE 08/10/2009  . DYSPHAGIA 08/10/2009  . Esophageal reflux 11/30/2008  . Unspecified constipation 11/30/2008  . DEPRESSION 10/09/2008  . ASTHMA 10/09/2008   Social History   Socioeconomic History  . Marital status: Single    Spouse  name: Not on file  . Number of children: 2  . Years of education: College  . Highest education level: Not on file  Occupational History  . Not on file  Social Needs  . Financial resource strain: Not on file  . Food insecurity:    Worry: Not on file    Inability: Not on file  . Transportation needs:    Medical: Not on file    Non-medical: Not on file  Tobacco Use  . Smoking status: Current Some Day Smoker    Packs/day: 0.25    Years: 15.00    Pack years: 3.75    Types: Cigarettes  . Smokeless tobacco: Never Used  . Tobacco comment: HAS CUT BACK  Substance and Sexual Activity  . Alcohol use: No    Comment: on occasion  . Drug use: No  . Sexual activity: Yes    Birth control/protection: Surgical  Lifestyle  . Physical activity:    Days per week: Not on file    Minutes per session: Not on file  . Stress: Not on file  Relationships  . Social connections:    Talks on phone: Not on file    Gets together: Not on file    Attends religious service: Not on file    Active member of club or organization: Not on  file    Attends meetings of clubs or organizations: Not on file    Relationship status: Not on file  . Intimate partner violence:    Fear of current or ex partner: Not on file    Emotionally abused: Not on file    Physically abused: Not on file    Forced sexual activity: Not on file  Other Topics Concern  . Not on file  Social History Narrative   Lives with fiance and children   Caffeine use:  Hot ginger tea- daily   Left-handed    Ms. Wolbert's family history includes Arthritis in her mother and sister; Asthma in her mother and sister; Breast cancer in her mother; Heart attack in her mother; Heart disease in her father; Hypertension in her father and mother; Neuropathy in her mother and sister; Stroke in her mother.      Objective:    Vitals:   01/12/19 0901  BP: (!) 158/90  Pulse: 90    Physical Exam; well-developed African-American female in no acute distress, pleasant somewhat tearful through interview.  Height 5 foot 9, weight 207, BMI 30.5.  HEENT; nontraumatic normocephalic EOMI PERRLA clear anicteric oral mucosa moist voice is a bit hoarse.  Neck supple.  Cardiovascular; regular rate and rhythm with S1-S2 no murmur rub or gallop, Pulmonary ;clear bilaterally.  Abdomen; soft, nontender nondistended bowel sounds are active there is no palpable mass or hepatosplenomegaly.  Rectal ;exam not done, Extremities; no clubbing cyanosis or edema skin warm and dry, Neuro psych; alert and oriented, grossly nonfocal mood and affect appropriate, she appears somewhat anxious and tearful.       Assessment & Plan:   #83 38 year old African-American female with long history of solid and liquid dysphagia and extensive previous GI evaluation here and through [facility]  She has had worsening of symptoms over the past several weeks. 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  Manometry had apparently been done at [facility] in 2016, I cannot see that report and no changes were made to her  regimen.  She says that empiric Botox injections remotely per Dr. Deatra Ina helped more than anything else.  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 Joellen Jersey Mickel Crow with plans for vocal cord injection if she did not have any improvement in symptoms.  By her report she has worsened over the past month  #3 history of adenomatous and hyperplastic colon polyps-last colonoscopy May 2015, will be due  for 5-year interval follow-up. #4 cardiomyopathy #5.  Depression  Plan; Advised patient we would call Dr. Dow Adolph office / [facility], and request he worked in. Will schedule for barium swallow. Schedule for esophageal manometry. Continue full liquid diet to add protein supplements i.e. Ensure at least twice daily Continue Prevacid Solutab's 30 mg once daily Further GI recommendations pending results of barium swallow and esophageal manometry.   Genia Harold PA-C 01/12/2019   Cc: Lin Landsman, MD

## 2019-01-12 NOTE — Progress Notes (Signed)
amb  

## 2019-01-14 NOTE — Telephone Encounter (Signed)
Telephone Encounter - Magister, Madelaine Bhat, MD - 01/11/2019 6:17 AM EST It is ok to leave it as is. She has no-showed her SLP appt x2. Thank you for working her in as you did.    Patient has reached out to the office herself. She is scheduled for 01/18/2019 which is very soon.

## 2019-01-20 ENCOUNTER — Ambulatory Visit (HOSPITAL_COMMUNITY): Admission: RE | Admit: 2019-01-20 | Payer: Medicaid Other | Source: Ambulatory Visit

## 2019-01-27 ENCOUNTER — Ambulatory Visit (HOSPITAL_COMMUNITY)
Admission: RE | Admit: 2019-01-27 | Discharge: 2019-01-27 | Disposition: A | Discharge status: Home / Self Care | Payer: Medicaid Other | Source: Ambulatory Visit | Attending: Physician Assistant | Admitting: Physician Assistant

## 2019-01-27 ENCOUNTER — Other Ambulatory Visit: Payer: Self-pay | Admitting: Physician Assistant

## 2019-01-27 DIAGNOSIS — J3800 Paralysis of vocal cords and larynx, unspecified: Secondary | ICD-10-CM | POA: Diagnosis present

## 2019-01-27 DIAGNOSIS — R1310 Dysphagia, unspecified: Secondary | ICD-10-CM

## 2019-01-27 DIAGNOSIS — R05 Cough: Secondary | ICD-10-CM | POA: Diagnosis present

## 2019-01-27 DIAGNOSIS — K224 Dyskinesia of esophagus: Secondary | ICD-10-CM | POA: Diagnosis present

## 2019-01-27 DIAGNOSIS — R059 Cough, unspecified: Secondary | ICD-10-CM

## 2019-02-09 ENCOUNTER — Telehealth: Payer: Self-pay | Admitting: Physician Assistant

## 2019-02-09 ENCOUNTER — Encounter (HOSPITAL_COMMUNITY): Admission: RE | Payer: Self-pay | Source: Home / Self Care

## 2019-02-09 ENCOUNTER — Ambulatory Visit (HOSPITAL_COMMUNITY): Admission: RE | Admit: 2019-02-09 | Payer: Medicaid Other | Source: Home / Self Care | Admitting: Gastroenterology

## 2019-02-09 SURGERY — MANOMETRY, ESOPHAGUS

## 2019-02-09 NOTE — Telephone Encounter (Signed)
Pt called in wanting to ask the nurse questions about 24hr ph probe

## 2019-02-09 NOTE — Telephone Encounter (Signed)
Spoke with the patient. Her transportation fell through and she was not able to go through with the esophageal manometry. She needs me to reschedule it. She tried to take care of this herself, but she thought she was to have an esophageal manometry and 24 hour Ph study. This in not what was previously ordered.  Office note does not indicate 24 hour study. With her diagnosis and symptoms, I wanted to be certain to schedule her correctly. Please review and advise. Thank you

## 2019-02-10 ENCOUNTER — Other Ambulatory Visit: Payer: Self-pay

## 2019-02-10 DIAGNOSIS — R1310 Dysphagia, unspecified: Secondary | ICD-10-CM

## 2019-02-10 NOTE — Telephone Encounter (Signed)
She just needs esophageal manometry at this time- thanks

## 2019-02-10 NOTE — Telephone Encounter (Signed)
Spoke with the patient. She is agreeable to the plan of care. Scheduled the patient for 02/23/2019 at 12:30. Called back to give her this information and got her voicemail. Left the information. Also sending written information through the mail and through the patient portal of her My Chart account.

## 2019-02-13 ENCOUNTER — Emergency Department (HOSPITAL_COMMUNITY)
Admission: EM | Admit: 2019-02-13 | Discharge: 2019-02-14 | Disposition: A | Discharge status: Home / Self Care | Payer: Medicaid Other | Attending: Emergency Medicine | Admitting: Emergency Medicine

## 2019-02-13 ENCOUNTER — Encounter (HOSPITAL_COMMUNITY): Payer: Self-pay | Admitting: Emergency Medicine

## 2019-02-13 ENCOUNTER — Emergency Department (HOSPITAL_COMMUNITY): Payer: Medicaid Other

## 2019-02-13 ENCOUNTER — Other Ambulatory Visit: Payer: Self-pay

## 2019-02-13 DIAGNOSIS — J45909 Unspecified asthma, uncomplicated: Secondary | ICD-10-CM | POA: Insufficient documentation

## 2019-02-13 DIAGNOSIS — F410 Panic disorder [episodic paroxysmal anxiety] without agoraphobia: Secondary | ICD-10-CM | POA: Diagnosis not present

## 2019-02-13 DIAGNOSIS — F17210 Nicotine dependence, cigarettes, uncomplicated: Secondary | ICD-10-CM | POA: Diagnosis not present

## 2019-02-13 DIAGNOSIS — R0789 Other chest pain: Secondary | ICD-10-CM

## 2019-02-13 DIAGNOSIS — R079 Chest pain, unspecified: Secondary | ICD-10-CM | POA: Diagnosis present

## 2019-02-13 DIAGNOSIS — K219 Gastro-esophageal reflux disease without esophagitis: Secondary | ICD-10-CM | POA: Insufficient documentation

## 2019-02-13 DIAGNOSIS — Z79899 Other long term (current) drug therapy: Secondary | ICD-10-CM | POA: Insufficient documentation

## 2019-02-13 LAB — CBC
HCT: 40.3 % (ref 36.0–46.0)
Hemoglobin: 13.8 g/dL (ref 12.0–15.0)
MCH: 30.3 pg (ref 26.0–34.0)
MCHC: 34.2 g/dL (ref 30.0–36.0)
MCV: 88.4 fL (ref 80.0–100.0)
Platelets: 240 10*3/uL (ref 150–400)
RBC: 4.56 MIL/uL (ref 3.87–5.11)
RDW: 12.2 % (ref 11.5–15.5)
WBC: 10.8 10*3/uL — ABNORMAL HIGH (ref 4.0–10.5)
nRBC: 0 % (ref 0.0–0.2)

## 2019-02-13 LAB — I-STAT TROPONIN, ED: Troponin i, poc: 0 ng/mL (ref 0.00–0.08)

## 2019-02-13 LAB — BASIC METABOLIC PANEL
Anion gap: 13 (ref 5–15)
BUN: 9 mg/dL (ref 6–20)
CO2: 21 mmol/L — ABNORMAL LOW (ref 22–32)
CREATININE: 0.88 mg/dL (ref 0.44–1.00)
Calcium: 9.8 mg/dL (ref 8.9–10.3)
Chloride: 105 mmol/L (ref 98–111)
GFR calc Af Amer: >60 mL/min (ref >60)
GFR calc non Af Amer: >60 mL/min (ref >60)
Glucose, Bld: 136 mg/dL — ABNORMAL HIGH (ref 70–99)
Potassium: 3.2 mmol/L — ABNORMAL LOW (ref 3.5–5.1)
Sodium: 139 mmol/L (ref 135–145)

## 2019-02-13 LAB — I-STAT BETA HCG BLOOD, ED (MC, WL, AP ONLY): I-stat hCG, quantitative: <5 m[IU]/mL (ref <5)

## 2019-02-13 MED ORDER — SODIUM CHLORIDE 0.9% FLUSH
3.0000 mL | Freq: Once | INTRAVENOUS | Status: AC
  Administered 2019-02-14: 3 mL via INTRAVENOUS

## 2019-02-13 NOTE — ED Notes (Signed)
Patient transported to X-ray 

## 2019-02-13 NOTE — ED Notes (Signed)
EDP at bedside  

## 2019-02-13 NOTE — ED Provider Notes (Signed)
TIME SEEN: 11:40 PM  CHIEF COMPLAINT: Pain with swallowing, chest pain and shortness of breath  HPI: Patient is a 38 year old female with history of achalasia, vocal cord paralysis status post tonsillectomy in 2002 who presents to the emergency department feeling like something is stuck in her throat and having pain.  She feels like her epiglottis is getting stuck whenever she is swallowing.  She states it will reopen and she will "gasp for breath".  She states she has had this burning pain since January.  States over the past several days it was worse and caused her to have increasing anxiety.  She is having chest pain and shortness of breath and numbness and tingling in her face and lips.  No fever.  Feels like her throat is swollen.  No hives, wheezing.  No new exposures.  She is followed by [facility] and is scheduled for an esophageal manometry on February 26.  She is also followed by [facility].  Has previously had Botox injections which she states was the only thing that improved her symptoms.  She does not recall eating something that could have gotten stuck.  She states she is following a strict diet recommended by gastroenterology.  ROS: See HPI Constitutional: no fever  Eyes: no drainage  ENT: no runny nose   Cardiovascular:   chest pain  Resp:  SOB  GI: no vomiting GU: no dysuria Integumentary: no rash  Allergy: no hives  Musculoskeletal: no leg swelling  Neurological: no slurred speech ROS otherwise negative  PAST MEDICAL HISTORY/PAST SURGICAL HISTORY:  Past Medical History:  Diagnosis Date  . Abnormal Pap smear   . Achalasia    esphog stretched several times  . Anxiety   . Arthritis   . Asthma    rarely uses inhaler  . Blood transfusion 01/2006   [place]  . Cardiomyopathy ([place])   . Constipation due to opioid therapy   . Depression   . Dysrhythmia   . Fibromyalgia   . GERD (gastroesophageal reflux disease)    no meds - diet controlled  . Heart murmur   . Hiatal  hernia   . History of abnormal Pap smear   . History of chlamydia 1990's  . HSV-2 infection   . HTN (hypertension)   . Occipital neuralgia of right side 04/21/2017  . Pneumonia 05/2013  . PVC's (premature ventricular contractions)   . Shortness of breath dyspnea   . Status post dilation of esophageal narrowing   . Vaginal Pap smear, abnormal     MEDICATIONS:  Prior to Admission medications   Medication Sig Start Date End Date Taking? Authorizing Provider  atenolol (TENORMIN) 25 MG tablet Take 25 mg by mouth daily.    [provider]  Capsaicin 0.1 % CREA Apple twice daily as needed 09/03/18   Armbruster, Carlota Raspberry, MD  clonazePAM (KLONOPIN) 2 MG tablet Take 2 mg by mouth every 8 (eight) hours. 04/02/17   [provider]  dicyclomine (BENTYL) 10 MG capsule Take 1-2 every 8 hours as needed Patient taking differently: Take 10 mg by mouth 3 (three) times daily as needed for spasms. Take 1-2 every 8 hours as needed 09/03/18   Armbruster, Carlota Raspberry, MD  FLUoxetine (PROZAC) 20 MG/5ML solution Take 10 mg by mouth daily.  01/13/18   [provider]  hydrOXYzine (ATARAX/VISTARIL) 50 MG tablet Take 50 mg by mouth every 6 (six) hours as needed for anxiety.  11/08/18   [provider]  lansoprazole (PREVACID  SOLUTAB) 30 MG disintegrating tablet Take 1 tablet (30 mg total) by mouth daily. 09/03/18   Armbruster, Carlota Raspberry, MD  LATUDA 60 MG TABS Take 1 tablet by mouth daily.  11/08/18   [provider]  lidocaine (XYLOCAINE) 2 % solution Use as directed 15 mLs in the mouth or throat every 4 (four) hours as needed for mouth pain. Swish and spit 01/08/19   Fawze, Mina A, PA-C  metroNIDAZOLE (FLAGYL) 500 MG tablet Take 500 mg by mouth daily as needed (bacterial infection).  11/21/18   [provider]  metroNIDAZOLE (METROGEL VAGINAL) 0.75 % vaginal gel Place 1 Applicatorful vaginally 2 (two) times daily. 02/13/17   Luvenia Redden, PA-C  oxyCODONE-acetaminophen  (PERCOCET) 10-325 MG tablet Take 1 tablet by mouth 4 (four) times daily.  06/09/16   [provider]  propranolol (INDERAL) 10 MG tablet Take 10 mg by mouth 2 (two) times daily.  11/09/18   [provider]  traZODone (DESYREL) 50 MG tablet Take 50 mg by mouth at bedtime.  11/08/18   [provider]    ALLERGIES:  Allergies  Allergen Reactions  . Dust Mite Extract Shortness Of Breath  . Other Shortness Of Breath    Feathers, trees "almost everything outside"  . Pollen Extract Shortness Of Breath    SOCIAL HISTORY:  Social History   Tobacco Use  . Smoking status: Current Some Day Smoker    Packs/day: 0.25    Years: 15.00    Pack years: 3.75    Types: Cigarettes  . Smokeless tobacco: Never Used  . Tobacco comment: HAS CUT BACK  Substance Use Topics  . Alcohol use: No    Comment: on occasion    FAMILY HISTORY: Family History  Problem Relation Age of Onset  . Heart disease Father   . Hypertension Father   . Stroke Mother   . Hypertension Mother   . Heart attack Mother   . Breast cancer Mother   . Neuropathy Mother   . Arthritis Mother   . Asthma Mother   . Neuropathy Sister   . Arthritis Sister   . Asthma Sister     EXAM: BP (!) 147/87 (BP Location: Right Arm)   Pulse 88   Temp (!) 97.5 F (36.4 C) (Oral)   Resp 18   Ht 5\' 9"  (1.753 m)   Wt 88.5 kg   LMP 02/06/2019   SpO2 100%   BMI 28.80 kg/m  CONSTITUTIONAL: Alert and oriented and responds appropriately to questions. Well-appearing; well-nourished, tearful, appears very anxious HEAD: Normocephalic EYES: Conjunctivae clear, pupils appear equal, EOMI ENT: normal nose; moist mucous membranes; No pharyngeal erythema or petechiae, patient is status post tonsillectomy, no uvular deviation, no unilateral swelling, no trismus or drooling, no muffled voice, normal phonation, no stridor, no dental caries present, no drainable dental abscess noted, no Ludwig's angina, tongue sits flat in the  bottom of the mouth, no angioedema, no facial erythema or warmth, no facial swelling; no pain with movement of the neck. NECK: Supple, no meningismus, no nuchal rigidity, no LAD  CARD: RRR; S1 and S2 appreciated; no murmurs, no clicks, no rubs, no gallops RESP: Normal chest excursion without splinting or tachypnea; breath sounds clear and equal bilaterally; no wheezes, no rhonchi, no rales, no hypoxia or respiratory distress, speaking full sentences ABD/GI: Normal bowel sounds; non-distended; soft, non-tender, no rebound, no guarding, no peritoneal signs, no hepatosplenomegaly BACK:  The back appears normal and is non-tender to palpation, there is no  CVA tenderness EXT: Normal ROM in all joints; non-tender to palpation; no edema; normal capillary refill; no cyanosis, no calf tenderness or swelling    SKIN: Normal color for age and race; warm; no rash no urticaria NEURO: Moves all extremities equally PSYCH: Patient is extremely anxious.  She is tearful.  MEDICAL DECISION MAKING: Patient here with complaints of feeling like her epiglottis is stuck.  Has had the symptoms since January.  No fever.  Had an x-ray in January that showed normal epiglottis.  I do not think that the patient has epiglottitis today.  The seem to be chronic issues for patient that are exacerbated by her chronic anxiety.  Her work-up from triage is unremarkable.  Chest pain and shortness of breath seem atypical and likely related to panic attack.  Troponin negative.  EKG shows no ischemic change.  Chest x-ray clear.  Will treat her symptoms with IV Ativan, GI cocktail.  Patient does not seem to be having any difficulty swallowing her secretions.  No sign of any allergic reaction present.  ED PROGRESS: She reports feeling much better after IV Ativan, Toradol, Pepcid, oral Maalox and viscous lidocaine.  She is smiling and no longer tearful.  Her labs today are reassuring as is her chest x-ray.  Again doubt ACS.  Doubt PE or dissection.   I suspect a lot of her symptoms are secondary to her anxiety and she agrees.  States she is out of her Klonopin but follows up with her doctor this Thursday, February 20.  I will give her a refill of her Klonopin to last until she sees her primary doctor.  We will also give her another prescription of viscous lidocaine.  She states she takes Pepcid chronically.  Was previously on PPI but taken off of this medication.  She will follow-up with ENT and GI as an outpatient.   At this time, I do not feel there is any life-threatening condition present. I have reviewed and discussed all results (EKG, imaging, lab, urine as appropriate) and exam findings with patient/family. I have reviewed nursing notes and appropriate previous records.  I feel the patient is safe to be discharged home without further emergent workup and can continue workup as an outpatient as needed. Discussed usual and customary return precautions. Patient/family verbalize understanding and are comfortable with this plan.  Outpatient follow-up has been provided as needed. All questions have been answered.      EKG Interpretation  Date/Time:  Sunday February 13 2019 23:24:42 EST Ventricular Rate:  83 PR Interval:    QRS Duration: 101 QT Interval:  399 QTC Calculation: 469 R Axis:   24 Text Interpretation:  Sinus rhythm Borderline low voltage, extremity leads No significant change since last tracing Confirmed by Carianna Lague, Cyril Mourning [phone]) on 02/13/2019 11:41:08 PM         Laneah Luft, Delice Bison, DO 02/14/19 0221

## 2019-02-13 NOTE — ED Triage Notes (Addendum)
Patient is complaining of throat is closing up. Patient also states she is having a panic attack. Patient is also is complaining of mid chest pain, dizziness, and weakness.

## 2019-02-14 ENCOUNTER — Telehealth: Payer: Self-pay | Admitting: Gastroenterology

## 2019-02-14 MED ORDER — ALUM & MAG HYDROXIDE-SIMETH 200-200-20 MG/5ML PO SUSP
30.0000 mL | Freq: Once | ORAL | Status: AC
  Administered 2019-02-14: 30 mL via ORAL
  Filled 2019-02-14: qty 30

## 2019-02-14 MED ORDER — LIDOCAINE VISCOUS HCL 2 % MT SOLN
15.0000 mL | Freq: Once | OROMUCOSAL | Status: AC
  Administered 2019-02-14: 15 mL via ORAL
  Filled 2019-02-14: qty 15

## 2019-02-14 MED ORDER — KETOROLAC TROMETHAMINE 30 MG/ML IJ SOLN
30.0000 mg | Freq: Once | INTRAMUSCULAR | Status: AC
  Administered 2019-02-14: 30 mg via INTRAVENOUS
  Filled 2019-02-14: qty 1

## 2019-02-14 MED ORDER — FAMOTIDINE IN NACL 20-0.9 MG/50ML-% IV SOLN
20.0000 mg | Freq: Once | INTRAVENOUS | Status: AC
  Administered 2019-02-14: 20 mg via INTRAVENOUS
  Filled 2019-02-14: qty 50

## 2019-02-14 MED ORDER — LIDOCAINE VISCOUS HCL 2 % MT SOLN: 15.0000 mL | Freq: Four times a day (QID) | OROMUCOSAL | 0 refills | Status: DC | PRN

## 2019-02-14 MED ORDER — LORAZEPAM 2 MG/ML IJ SOLN
1.0000 mg | Freq: Once | INTRAMUSCULAR | Status: AC
  Administered 2019-02-14: 1 mg via INTRAVENOUS
  Filled 2019-02-14: qty 1

## 2019-02-14 MED ORDER — CLONAZEPAM 2 MG PO TABS: 2.0000 mg | ORAL_TABLET | Freq: Three times a day (TID) | ORAL | 0 refills | Status: DC

## 2019-02-14 NOTE — Telephone Encounter (Signed)
Spoke with the patient. She had a "spell" in which she felt her epiglotis was swollen and obstructing, at least partially, her breathing. She went to the ER. See that note for details. She is afraid to eat. Afraid she will strangle or choke. Moved the esophageal manometry to 02/16/19 at 8:30 am. She thanks me for this. I am watching for an opening to put her in for an office visit.

## 2019-02-14 NOTE — ED Notes (Signed)
Pt provided glass of water for Fluid Challenge.

## 2019-02-15 NOTE — Telephone Encounter (Signed)
Ok

## 2019-02-15 NOTE — Telephone Encounter (Signed)
Thanks, will await manometry but suspect she will need to follow up with her ENT for this issue

## 2019-02-16 NOTE — Progress Notes (Signed)
Noticed patient had not arrived for procedure.  Called admitting and patient was not present.  Called patient's phone number provided in chart and left a voice message.  Encouraged patient to call office if she needed to reschedule when left voice message.

## 2019-02-22 ENCOUNTER — Ambulatory Visit: Payer: Medicaid Other | Admitting: Gastroenterology

## 2019-02-23 ENCOUNTER — Encounter (HOSPITAL_COMMUNITY)
Admission: RE | Disposition: A | Discharge status: Home / Self Care | Payer: Self-pay | Source: Home / Self Care | Attending: Gastroenterology

## 2019-02-23 ENCOUNTER — Ambulatory Visit (HOSPITAL_COMMUNITY)
Admission: RE | Admit: 2019-02-23 | Discharge: 2019-02-23 | Disposition: A | Discharge status: Home / Self Care | Payer: Medicaid Other | Attending: Gastroenterology | Admitting: Gastroenterology

## 2019-02-23 DIAGNOSIS — K219 Gastro-esophageal reflux disease without esophagitis: Secondary | ICD-10-CM | POA: Diagnosis not present

## 2019-02-23 DIAGNOSIS — R1310 Dysphagia, unspecified: Secondary | ICD-10-CM | POA: Diagnosis not present

## 2019-02-23 DIAGNOSIS — R0989 Other specified symptoms and signs involving the circulatory and respiratory systems: Secondary | ICD-10-CM | POA: Diagnosis not present

## 2019-02-23 DIAGNOSIS — R09A2 Foreign body sensation, throat: Secondary | ICD-10-CM

## 2019-02-23 HISTORY — PX: ESOPHAGEAL MANOMETRY: SHX5429

## 2019-02-23 SURGERY — MANOMETRY, ESOPHAGUS

## 2019-02-23 MED ORDER — LIDOCAINE VISCOUS HCL 2 % MT SOLN
OROMUCOSAL | Status: AC
  Filled 2019-02-23: qty 15

## 2019-02-23 SURGICAL SUPPLY — 2 items
FACESHIELD LNG OPTICON STERILE (SAFETY) IMPLANT
GLOVE BIO SURGEON STRL SZ8 (GLOVE) ×6 IMPLANT

## 2019-02-23 NOTE — Progress Notes (Signed)
Patient requested daughter be present at bedside to help with anxiety.  Daughter verbalized feeling comfortable about being present during the procedure.  Esophageal manometry performed per protocol.  Patient tolerated procedure without complications.  Dr. Silverio Decamp to interpret results.

## 2019-02-24 ENCOUNTER — Encounter (HOSPITAL_COMMUNITY): Payer: Self-pay | Admitting: Gastroenterology

## 2019-02-25 DIAGNOSIS — R0989 Other specified symptoms and signs involving the circulatory and respiratory systems: Secondary | ICD-10-CM

## 2019-03-07 ENCOUNTER — Telehealth: Payer: Self-pay | Admitting: Gastroenterology

## 2019-03-07 NOTE — Telephone Encounter (Signed)
Pt reported that "there is a pocket in her throat and there is food in it.  I have been choking all night."  Pt requested to be seen ASAP.  Please CB to advise.

## 2019-03-07 NOTE — Telephone Encounter (Signed)
Spoke with the patient. She reports "hard places where my tonsils were." Patient has to keep clearing her throat while we are talking on the phone. She has c/o painful swallowing. Denies fever. Some coughing but connects that with her throat clearing. She has not reached out to her ENT.  Encouraged her to contact the office of Dr Joellen Jersey tomorrow morning. Called to Dr Beverely Low Magister's office for fax number. Esophageal manometry faxed to [phone]. Nurse confirmed the DG esophagus is available in Epic as well as the office notes from recent visits.

## 2019-03-07 NOTE — Telephone Encounter (Signed)
Thanks [employer]. Her barium swallow was normal. Her esophageal manometry was normal as well. I think ENT will be more beneficial at this time. If they can't find anything can consider EGD but I think low yield given findings to date. I agree with your recommendations. Thanks

## 2019-04-27 ENCOUNTER — Emergency Department (HOSPITAL_COMMUNITY)
Admission: EM | Admit: 2019-04-27 | Discharge: 2019-04-28 | Disposition: A | Discharge status: Home / Self Care | Payer: Medicaid Other | Attending: Emergency Medicine | Admitting: Emergency Medicine

## 2019-04-27 ENCOUNTER — Other Ambulatory Visit: Payer: Self-pay

## 2019-04-27 DIAGNOSIS — I10 Essential (primary) hypertension: Secondary | ICD-10-CM | POA: Insufficient documentation

## 2019-04-27 DIAGNOSIS — R1310 Dysphagia, unspecified: Secondary | ICD-10-CM | POA: Insufficient documentation

## 2019-04-27 DIAGNOSIS — F17210 Nicotine dependence, cigarettes, uncomplicated: Secondary | ICD-10-CM | POA: Diagnosis not present

## 2019-04-27 DIAGNOSIS — Z79899 Other long term (current) drug therapy: Secondary | ICD-10-CM | POA: Diagnosis not present

## 2019-04-27 DIAGNOSIS — F419 Anxiety disorder, unspecified: Secondary | ICD-10-CM | POA: Diagnosis not present

## 2019-04-27 DIAGNOSIS — J45909 Unspecified asthma, uncomplicated: Secondary | ICD-10-CM | POA: Diagnosis not present

## 2019-04-27 DIAGNOSIS — J383 Other diseases of vocal cords: Secondary | ICD-10-CM | POA: Diagnosis not present

## 2019-04-27 NOTE — ED Triage Notes (Signed)
Per pt she is having SOB due to her dx of vocal cord paralysis. Pt is saying she has anxiety due to the feeling of not being able to swallow.Says she feels like it is making her not being able to breath. Says it feels like there is a knot in her throat.Stats at 100%

## 2019-04-28 ENCOUNTER — Emergency Department (HOSPITAL_COMMUNITY): Payer: Medicaid Other

## 2019-04-28 LAB — CBC
HCT: 37.7 % (ref 36.0–46.0)
Hemoglobin: 13.2 g/dL (ref 12.0–15.0)
MCH: 30.5 pg (ref 26.0–34.0)
MCHC: 35 g/dL (ref 30.0–36.0)
MCV: 87.1 fL (ref 80.0–100.0)
Platelets: 219 10*3/uL (ref 150–400)
RBC: 4.33 MIL/uL (ref 3.87–5.11)
RDW: 12.7 % (ref 11.5–15.5)
WBC: 9.6 10*3/uL (ref 4.0–10.5)
nRBC: 0 % (ref 0.0–0.2)

## 2019-04-28 LAB — BASIC METABOLIC PANEL
Anion gap: 11 (ref 5–15)
BUN: 6 mg/dL (ref 6–20)
CO2: 20 mmol/L — ABNORMAL LOW (ref 22–32)
Calcium: 9.3 mg/dL (ref 8.9–10.3)
Chloride: 105 mmol/L (ref 98–111)
Creatinine, Ser: 0.82 mg/dL (ref 0.44–1.00)
GFR calc Af Amer: >60 mL/min (ref >60)
GFR calc non Af Amer: >60 mL/min (ref >60)
Glucose, Bld: 107 mg/dL — ABNORMAL HIGH (ref 70–99)
Potassium: 3.2 mmol/L — ABNORMAL LOW (ref 3.5–5.1)
Sodium: 136 mmol/L (ref 135–145)

## 2019-04-28 LAB — I-STAT BETA HCG BLOOD, ED (MC, WL, AP ONLY): I-stat hCG, quantitative: <5 m[IU]/mL (ref <5)

## 2019-04-28 MED ORDER — LIDOCAINE VISCOUS HCL 2 % MT SOLN
15.0000 mL | Freq: Once | OROMUCOSAL | Status: AC
  Administered 2019-04-28: 15 mL via OROMUCOSAL
  Filled 2019-04-28: qty 15

## 2019-04-28 NOTE — ED Provider Notes (Signed)
[facility] EMERGENCY DEPARTMENT Provider Note  CSN: [id number] Arrival date & time: 04/27/19 2332  Chief Complaint(s) Shortness of Breath  HPI Raven [name] is a 38 y.o. female with a past medical history listed below including vocal cord dysfunction and anxiety who presents to the emergency department with odynophagia and which she describes as shortness of breath related to her vocal cord dysfunction.  She reports that it feels like her epiglottis folds down into her esophagus/trachea making her feel like she has difficulty breathing.  This is been an ongoing issue with intermittent and sporadic exacerbations.  She presents tonight for the symptoms as well as exacerbation of her anxiety related to the symptoms.  Currently denies any shortness of breath, chest pain.  She endorses the odynophagia and trouble swallowing which she attributes to food being stuck in her vallecula, which improves when she clears her throat.  She reports that due to the pandemic she has not been able to follow-up with her ENT.   HPI  Past Medical History Past Medical History:  Diagnosis Date  . Abnormal Pap smear   . Achalasia    esphog stretched several times  . Anxiety   . Arthritis   . Asthma    rarely uses inhaler  . Blood transfusion 01/2006   [name]  . Cardiomyopathy ([place])   . Constipation due to opioid therapy   . Depression   . Dysrhythmia   . Fibromyalgia   . GERD (gastroesophageal reflux disease)    no meds - diet controlled  . Heart murmur   . Hiatal hernia   . History of abnormal Pap smear   . History of chlamydia 1990's  . HSV-2 infection   . HTN (hypertension)   . Occipital neuralgia of right side 04/21/2017  . Pneumonia 05/2013  . PVC's (premature ventricular contractions)   . Shortness of breath dyspnea   . Status post dilation of esophageal narrowing   . Vaginal Pap smear, abnormal    Patient Active Problem List   Diagnosis Date Noted  . Globus sensation   .  Chronic bilateral low back pain 09/07/2018  . Acute pain of both knees 09/07/2018  . 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 01/18/2018  . Chronically on opiate therapy 01/18/2018  . Excessive daytime sleepiness 01/18/2018  . Vivid dream 01/18/2018  . 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 Occipital neuralgia of right side 04/21/2017  . Fibromyalgia 04/21/2017  . UTI (urinary tract infection) 02/12/2017  . Gallstones and inflammation of gallbladder without obstruction 01/23/2015  . S/P laparoscopic surgery 01/23/2015  . Upper airway cough syndrome 09/05/2014  . Cigarette smoker 08/09/2014  . Dyspnea 08/08/2014  . Abdominal pain, left lower quadrant 03/27/2014  . Cardiomyopathy, idiopathic ([place]) 01/09/2014  . History of chlamydia 01/09/2013  . 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  . History of abnormal Pap smear 01/09/2013  . Achalasia   . Abnormal Pap smear   . Depression   . HSV-2 infection   . Heart palpitations   . Heart murmur   . Anxiety   . GERD (gastroesophageal reflux disease)   . 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 PVC's (premature ventricular contractions) 05/19/2011  . Palpitation 04/11/2011  . ESOPHAGEAL STRICTURE 08/10/2009  . Dysphagia 08/10/2009  . Esophageal reflux 11/30/2008  . Unspecified constipation 11/30/2008  . DEPRESSION 10/09/2008  . ASTHMA 10/09/2008   Home Medication(s) Prior to Admission medications  Medication Sig Start Date End Date Taking? Authorizing Provider  atenolol (TENORMIN) 25 MG tablet Take 25 mg by mouth daily.   Yes [provider]  Capsaicin 0.1 % CREA Apple twice daily as needed Patient taking differently: Apply topically 2 (two) times daily as needed (for rash).  09/03/18  Yes Armbruster, Carlota Raspberry, MD  clonazePAM (KLONOPIN) 2 MG tablet Take 1 tablet (2 mg total) by mouth every 8 (eight) hours. 02/14/19  Yes Ward, Delice Bison,  DO  dicyclomine (BENTYL) 10 MG capsule Take 1-2 every 8 hours as needed Patient taking differently: Take 10 mg by mouth 3 (three) times daily as needed for spasms. Take 1-2 every 8 hours as needed 09/03/18  Yes Armbruster, Carlota Raspberry, MD  FLUoxetine (PROZAC) 20 MG/5ML solution Take 10 mg by mouth daily.  01/13/18  Yes [provider]  hydrOXYzine (ATARAX/VISTARIL) 50 MG tablet Take 50 mg by mouth every 6 (six) hours as needed for anxiety.  11/08/18  Yes [provider]  lansoprazole (PREVACID SOLUTAB) 30 MG disintegrating tablet Take 1 tablet (30 mg total) by mouth daily. 09/03/18  Yes Armbruster, Carlota Raspberry, MD  LATUDA 60 MG TABS Take 1 tablet by mouth daily.  11/08/18  Yes [provider]  lidocaine (XYLOCAINE) 2 % solution Use as directed 15 mLs in the mouth or throat every 6 (six) hours as needed for mouth pain. 02/14/19  Yes Ward, Delice Bison, DO  metroNIDAZOLE (FLAGYL) 500 MG tablet Take 500 mg by mouth daily as needed (bacterial infection).  11/21/18  Yes [provider]  oxyCODONE-acetaminophen (PERCOCET) 10-325 MG tablet Take 1 tablet by mouth 4 (four) times daily.  06/09/16  Yes [provider]  propranolol (INDERAL) 10 MG tablet Take 10 mg by mouth 2 (two) times daily.  11/09/18  Yes [provider]  traZODone (DESYREL) 50 MG tablet Take 50 mg by mouth at bedtime.  11/08/18  Yes [provider]  lidocaine (XYLOCAINE) 2 % solution Use as directed 15 mLs in the mouth or throat every 4 (four) hours as needed for mouth pain. Swish and spit Patient not taking: Reported on 04/28/2019 01/08/19   Rodell Perna A, PA-C  metroNIDAZOLE (METROGEL VAGINAL) 0.75 % vaginal gel Place 1 Applicatorful vaginally 2 (two) times daily. Patient not taking: Reported on 04/28/2019 02/13/17   Danielle Rankin                                                                                                                                    Past Surgical History Past  Surgical History:  Procedure Laterality Date  . ADENOIDECTOMY  12/2002  . BLADDER SUSPENSION  06/11/2012   Procedure: TRANSVAGINAL TAPE (TVT) PROCEDURE;  Surgeon: Delice Lesch, MD;  Location: [facility];  Service: Gynecology;  Laterality: N/A;  Tension Free Vaginal Tape/cystoscopy  . BREAST SURGERY  07/2001   tumor removed right breast  . CHOLECYSTECTOMY N/A  01/23/2015   Procedure: LAPAROSCOPIC CHOLECYSTECTOMY ;  Surgeon: Fanny Skates, MD;  Location: [facility];  Service: General;  Laterality: N/A;  case booked with cholangiogram but MD did not complete intraoperative cholangiogram   . CYSTOSCOPY  06/11/2012   Procedure: CYSTOSCOPY;  Surgeon: Delice Lesch, MD;  Location: [facility];  Service: Gynecology;  Laterality: N/A;  . DILATION AND CURETTAGE OF UTERUS  1999  . DILATION AND CURETTAGE OF UTERUS  01/2006   retained products  . ESOPHAGEAL MANOMETRY N/A 02/23/2019   Procedure: ESOPHAGEAL MANOMETRY (EM);  Surgeon: Mauri Pole, MD;  Location: [facility];  Service: Endoscopy;  Laterality: N/A;  . ESOPHAGOGASTRODUODENOSCOPY     with esophageal dilation x 3  . FOREIGN BODY REMOVAL  06/11/2012   Procedure: REMOVAL FOREIGN BODY EXTREMITY;  Surgeon: Delice Lesch, MD;  Location: [facility];  Service: Gynecology;  Laterality: Right;  Removal of Implanon   . LAPAROSCOPIC TUBAL LIGATION  06/11/2012   Procedure: LAPAROSCOPIC TUBAL LIGATION;  Surgeon: Delice Lesch, MD;  Location: [facility];  Service: Gynecology;  Laterality: Bilateral;  Laparoscopic Bilateral Fulgeration  . TONSILLECTOMY    . TUBAL LIGATION     Family History Family History  Problem Relation Age of Onset  . Heart disease Father   . Hypertension Father   . Stroke Mother   . Hypertension Mother   . Heart attack Mother   . Breast cancer Mother   . Neuropathy Mother   . Arthritis Mother   . Asthma Mother   . Neuropathy Sister   . Arthritis Sister   . Asthma Sister     Social History Social History   Tobacco Use  . Smoking  status: Current Some Day Smoker    Packs/day: 0.25    Years: 15.00    Pack years: 3.75    Types: Cigarettes  . Smokeless tobacco: Never Used  . Tobacco comment: HAS CUT BACK  Substance Use Topics  . Alcohol use: No    Comment: on occasion  . Drug use: No   Allergies Dust mite extract; Other; and Pollen extract  Review of Systems Review of Systems All other systems are reviewed and are negative for acute change except as noted in the HPI  Physical Exam Vital Signs  I have reviewed the triage vital signs BP (!) 143/91 (BP Location: Right Arm)   Pulse 84   Temp 98.5 F (36.9 C) (Oral)   Resp 18   SpO2 100%   Physical Exam Vitals signs reviewed.  Constitutional:      General: She is not in acute distress.    Appearance: She is well-developed. She is not diaphoretic.  HENT:     Head: Normocephalic and atraumatic.     Nose: Nose normal.     Mouth/Throat:     Mouth: No angioedema.     Pharynx: No pharyngeal swelling, oropharyngeal exudate or posterior oropharyngeal erythema.     Tonsils: No tonsillar exudate.  Eyes:     General: No scleral icterus.       Right eye: No discharge.        Left eye: No discharge.     Conjunctiva/sclera: Conjunctivae normal.     Pupils: Pupils are equal, round, and reactive to light.  Neck:     Musculoskeletal: Normal range of motion and neck supple.  Cardiovascular:     Rate and Rhythm: Normal rate and regular rhythm.     Heart sounds: No murmur. No friction rub. No gallop.  Pulmonary:     Effort: Pulmonary effort is normal. No respiratory distress.     Breath sounds: Normal breath sounds. No stridor. No rales.  Abdominal:     General: There is no distension.     Palpations: Abdomen is soft.     Tenderness: There is no abdominal tenderness.  Musculoskeletal:        General: No tenderness.  Skin:    General: Skin is warm and dry.     Findings: No erythema or rash.  Neurological:     Mental Status: She is alert and oriented to  person, place, and time.     ED Results and Treatments Labs (all labs ordered are listed, but only abnormal results are displayed) Labs Reviewed  BASIC METABOLIC PANEL - Abnormal; Notable for the following components:      Result Value   Potassium 3.2 (*)    CO2 20 (*)    Glucose, Bld 107 (*)    All other components within normal limits  CBC  I-STAT BETA HCG BLOOD, ED ([facility], [facility], [facility] ONLY)                                                                                                                         EKG  EKG Interpretation  Date/Time:  Wednesday April 27 2019 23:41:47 EDT Ventricular Rate:  94 PR Interval:  158 QRS Duration: 82 QT Interval:  368 QTC Calculation: 460 R Axis:   85 Text Interpretation:  Normal sinus rhythm Normal ECG No significant change since last tracing Confirmed by Addison Lank [id number]) on 04/28/2019 1:02:36 AM      Radiology Dg Chest 2 View  Result Date: 04/28/2019 CLINICAL DATA:  38 year old female with shortness of breath. Vocal cord paralysis. Smoker. EXAM: CHEST - 2 VIEW COMPARISON:  02/13/2019 chest radiographs and earlier. FINDINGS: PA and lateral views of the chest. Mildly lower lung volumes. Normal cardiac size and mediastinal contours. Visualized tracheal air column is within normal limits. Mild bilateral pulmonary interstitial markings are chronic to a degree but appear increased in both lungs. No pneumothorax, pleural effusion or confluent pulmonary opacity. Stable cholecystectomy clips. Negative visible bowel gas pattern. No osseous abnormality identified. IMPRESSION: Acute on chronic increased pulmonary interstitial markings. Consider viral/atypical respiratory infection. Electronically Signed   By: Genevie Ann M.D.   On: 04/28/2019 00:29   Pertinent labs & imaging results that were available during my care of the patient were reviewed by me and considered in my medical decision making (see chart for details).  Medications Ordered in ED  Medications  lidocaine (XYLOCAINE) 2 % viscous mouth solution 15 mL (15 mLs Mouth/Throat Given 04/28/19 0155)  Procedures Procedures  (including critical care time)  Medical Decision Making / ED Course I have reviewed the nursing notes for this encounter and the patient's prior records (if available in EHR or on provided paperwork).    Patient is afebrile with stable vital signs.  No respiratory distress.  Able to speak in full sentences without any change in phonation.  Tolerating her oral secretions during the interview.  She does not present with any infectious symptoms.  This appears to be exacerbation of her vocal cord dysfunction and pharyngeal muscular dysfunction.  Able to tolerate viscous lidocaine.   Work-up in triage was reassuring and at her baseline.  The patient appears reasonably screened and/or stabilized for discharge and I doubt any other medical condition or other [facility] requiring further screening, evaluation, or treatment in the ED at this time prior to discharge.  The patient is safe for discharge with strict return precautions.   Final Clinical Impression(s) / ED Diagnoses Final diagnoses:  Odynophagia  Vocal cord dysfunction    Disposition: Discharge  Condition: Good  I have discussed the results, Dx and Tx plan with the patient who expressed understanding and agree(s) with the plan. Discharge instructions discussed at great length. The patient was given strict return precautions who verbalized understanding of the instructions. No further questions at time of discharge.    ED Discharge Orders    None       Follow Up: Otolaryngologist   As needed     This chart was dictated using voice recognition software.  Despite best efforts to proofread,  errors can occur which can change the documentation meaning.   Fatima Blank, MD 04/28/19 [phone]

## 2019-04-29 ENCOUNTER — Encounter (HOSPITAL_COMMUNITY): Payer: Self-pay | Admitting: Emergency Medicine

## 2019-04-29 ENCOUNTER — Emergency Department (HOSPITAL_COMMUNITY)
Admission: EM | Admit: 2019-04-29 | Discharge: 2019-04-29 | Disposition: A | Discharge status: Home / Self Care | Payer: Medicaid Other | Attending: Emergency Medicine | Admitting: Emergency Medicine

## 2019-04-29 ENCOUNTER — Other Ambulatory Visit: Payer: Self-pay

## 2019-04-29 ENCOUNTER — Emergency Department (HOSPITAL_COMMUNITY): Payer: Medicaid Other

## 2019-04-29 DIAGNOSIS — R197 Diarrhea, unspecified: Secondary | ICD-10-CM | POA: Insufficient documentation

## 2019-04-29 DIAGNOSIS — I493 Ventricular premature depolarization: Secondary | ICD-10-CM | POA: Diagnosis not present

## 2019-04-29 DIAGNOSIS — R0789 Other chest pain: Secondary | ICD-10-CM | POA: Diagnosis not present

## 2019-04-29 DIAGNOSIS — Z79899 Other long term (current) drug therapy: Secondary | ICD-10-CM | POA: Diagnosis not present

## 2019-04-29 DIAGNOSIS — R1011 Right upper quadrant pain: Secondary | ICD-10-CM | POA: Diagnosis not present

## 2019-04-29 DIAGNOSIS — R110 Nausea: Secondary | ICD-10-CM | POA: Diagnosis not present

## 2019-04-29 DIAGNOSIS — B9789 Other viral agents as the cause of diseases classified elsewhere: Secondary | ICD-10-CM

## 2019-04-29 DIAGNOSIS — I10 Essential (primary) hypertension: Secondary | ICD-10-CM | POA: Insufficient documentation

## 2019-04-29 DIAGNOSIS — Z8709 Personal history of other diseases of the respiratory system: Secondary | ICD-10-CM | POA: Insufficient documentation

## 2019-04-29 DIAGNOSIS — J069 Acute upper respiratory infection, unspecified: Secondary | ICD-10-CM | POA: Diagnosis not present

## 2019-04-29 DIAGNOSIS — F17210 Nicotine dependence, cigarettes, uncomplicated: Secondary | ICD-10-CM | POA: Insufficient documentation

## 2019-04-29 DIAGNOSIS — R05 Cough: Secondary | ICD-10-CM | POA: Diagnosis present

## 2019-04-29 LAB — URINALYSIS, ROUTINE W REFLEX MICROSCOPIC
Bilirubin Urine: NEGATIVE
Glucose, UA: NEGATIVE mg/dL
Ketones, ur: 5 mg/dL — AB
Nitrite: NEGATIVE
Protein, ur: NEGATIVE mg/dL
Specific Gravity, Urine: 1.013 (ref 1.005–1.030)
pH: 5 (ref 5.0–8.0)

## 2019-04-29 LAB — COMPREHENSIVE METABOLIC PANEL
ALT: 24 U/L (ref 0–44)
AST: 23 U/L (ref 15–41)
Albumin: 3.9 g/dL (ref 3.5–5.0)
Alkaline Phosphatase: 61 U/L (ref 38–126)
Anion gap: 12 (ref 5–15)
BUN: 6 mg/dL (ref 6–20)
CO2: 21 mmol/L — ABNORMAL LOW (ref 22–32)
Calcium: 9.4 mg/dL (ref 8.9–10.3)
Chloride: 108 mmol/L (ref 98–111)
Creatinine, Ser: 0.88 mg/dL (ref 0.44–1.00)
GFR calc Af Amer: >60 mL/min (ref >60)
GFR calc non Af Amer: >60 mL/min (ref >60)
Glucose, Bld: 114 mg/dL — ABNORMAL HIGH (ref 70–99)
Potassium: 3.2 mmol/L — ABNORMAL LOW (ref 3.5–5.1)
Sodium: 141 mmol/L (ref 135–145)
Total Bilirubin: 0.5 mg/dL (ref 0.3–1.2)
Total Protein: 6.6 g/dL (ref 6.5–8.1)

## 2019-04-29 LAB — CBC WITH DIFFERENTIAL/PLATELET
Abs Immature Granulocytes: 0.02 10*3/uL (ref 0.00–0.07)
Basophils Absolute: 0 10*3/uL (ref 0.0–0.1)
Basophils Relative: 0 %
Eosinophils Absolute: 0.1 10*3/uL (ref 0.0–0.5)
Eosinophils Relative: 1 %
HCT: 37 % (ref 36.0–46.0)
Hemoglobin: 12.8 g/dL (ref 12.0–15.0)
Immature Granulocytes: 0 %
Lymphocytes Relative: 43 %
Lymphs Abs: 3.8 10*3/uL (ref 0.7–4.0)
MCH: 30.7 pg (ref 26.0–34.0)
MCHC: 34.6 g/dL (ref 30.0–36.0)
MCV: 88.7 fL (ref 80.0–100.0)
Monocytes Absolute: 0.6 10*3/uL (ref 0.1–1.0)
Monocytes Relative: 7 %
Neutro Abs: 4.3 10*3/uL (ref 1.7–7.7)
Neutrophils Relative %: 49 %
Platelets: 202 10*3/uL (ref 150–400)
RBC: 4.17 MIL/uL (ref 3.87–5.11)
RDW: 12.7 % (ref 11.5–15.5)
WBC: 9 10*3/uL (ref 4.0–10.5)
nRBC: 0 % (ref 0.0–0.2)

## 2019-04-29 LAB — I-STAT BETA HCG BLOOD, ED (MC, WL, AP ONLY): I-stat hCG, quantitative: <5 m[IU]/mL (ref <5)

## 2019-04-29 LAB — LIPASE, BLOOD: Lipase: 33 U/L (ref 11–51)

## 2019-04-29 MED ORDER — KETOROLAC TROMETHAMINE 30 MG/ML IJ SOLN
30.0000 mg | Freq: Once | INTRAMUSCULAR | Status: AC
  Administered 2019-04-29: 30 mg via INTRAVENOUS
  Filled 2019-04-29: qty 1

## 2019-04-29 MED ORDER — LIDOCAINE VISCOUS HCL 2 % MT SOLN
15.0000 mL | Freq: Once | OROMUCOSAL | Status: AC
  Administered 2019-04-29: 05:00:00 15 mL via ORAL
  Filled 2019-04-29: qty 15

## 2019-04-29 MED ORDER — ALUM & MAG HYDROXIDE-SIMETH 200-200-20 MG/5ML PO SUSP
30.0000 mL | Freq: Once | ORAL | Status: AC
  Administered 2019-04-29: 05:00:00 30 mL via ORAL
  Filled 2019-04-29: qty 30

## 2019-04-29 MED ORDER — SODIUM CHLORIDE 0.9 % IV BOLUS (SEPSIS)
1000.0000 mL | Freq: Once | INTRAVENOUS | Status: AC
  Administered 2019-04-29: 03:00:00 1000 mL via INTRAVENOUS

## 2019-04-29 MED ORDER — ONDANSETRON HCL 4 MG/2ML IJ SOLN
4.0000 mg | Freq: Once | INTRAMUSCULAR | Status: AC
  Administered 2019-04-29: 4 mg via INTRAVENOUS
  Filled 2019-04-29: qty 2

## 2019-04-29 NOTE — ED Triage Notes (Signed)
Patient arrived with EMS from home reports persistent productive cough with chest congestion and chest tightness for several days worse with exertion/deep inspiration.

## 2019-04-29 NOTE — ED Provider Notes (Signed)
TIME SEEN: 2:38 AM  CHIEF COMPLAINT: Cough, burning chest pain with coughing, abdominal pain, diarrhea  HPI: Patient is a 38 year old female with history of achalasia, vocal cord dysfunction status post tonsillectomy in 2002, panic attacks, hypertension who presents to the emergency department with 2 to 3 days worth of cough, congestion.  Describes the cough is a dry cough.  No known fevers or chills.  Has had burning chest pain with coughing.  Complains of some right upper quadrant abdominal pain, nausea and diarrhea.  No vomiting.  Has had a cholecystectomy.  States she has been isolating at home with her 28 year old son but states she was placed in jail several days ago after she was arrested because of a warrant for a failure to appear in court charge.  She is not sure if anyone was sick in jail with her.  She is concerned that she has coronavirus.  Was seen here yesterday and had a chest x-ray that showed acute on chronic increased pulmonary interstitial markings concerning for possible viral/atypical respiratory infection.  She states since that time her symptoms have worsened.  ROS: See HPI Constitutional: no fever  Eyes: no drainage  ENT: no runny nose   Cardiovascular:  no chest pain  Resp: no SOB  GI: no vomiting GU: no dysuria Integumentary: no rash  Allergy: no hives  Musculoskeletal: no leg swelling  Neurological: no slurred speech ROS otherwise negative  PAST MEDICAL HISTORY/PAST SURGICAL HISTORY:  Past Medical History:  Diagnosis Date  . Abnormal Pap smear   . Achalasia    esphog stretched several times  . Anxiety   . Arthritis   . Asthma    rarely uses inhaler  . Blood transfusion 01/2006   [place]  . Cardiomyopathy ([place])   . Constipation due to opioid therapy   . Depression   . Dysrhythmia   . Fibromyalgia   . GERD (gastroesophageal reflux disease)    no meds - diet controlled  . Heart murmur   . Hiatal hernia   . History of abnormal Pap smear   . History of  chlamydia 1990's  . HSV-2 infection   . HTN (hypertension)   . Occipital neuralgia of right side 04/21/2017  . Pneumonia 05/2013  . PVC's (premature ventricular contractions)   . Shortness of breath dyspnea   . Status post dilation of esophageal narrowing   . Vaginal Pap smear, abnormal     MEDICATIONS:  Prior to Admission medications   Medication Sig Start Date End Date Taking? Authorizing Provider  atenolol (TENORMIN) 25 MG tablet Take 25 mg by mouth daily.    [provider]  Capsaicin 0.1 % CREA Apple twice daily as needed Patient taking differently: Apply topically 2 (two) times daily as needed (for rash).  09/03/18   Armbruster, Carlota Raspberry, MD  clonazePAM (KLONOPIN) 2 MG tablet Take 1 tablet (2 mg total) by mouth every 8 (eight) hours. 02/14/19   Jamielynn Wigley, Delice Bison, DO  dicyclomine (BENTYL) 10 MG capsule Take 1-2 every 8 hours as needed Patient taking differently: Take 10 mg by mouth 3 (three) times daily as needed for spasms. Take 1-2 every 8 hours as needed 09/03/18   Armbruster, Carlota Raspberry, MD  FLUoxetine (PROZAC) 20 MG/5ML solution Take 10 mg by mouth daily.  01/13/18   [provider]  hydrOXYzine (ATARAX/VISTARIL) 50 MG tablet Take 50 mg by mouth every 6 (six) hours as needed for anxiety.  11/08/18   [provider]  lansoprazole (PREVACID SOLUTAB) 30  MG disintegrating tablet Take 1 tablet (30 mg total) by mouth daily. 09/03/18   Armbruster, Carlota Raspberry, MD  LATUDA 60 MG TABS Take 1 tablet by mouth daily.  11/08/18   [provider]  lidocaine (XYLOCAINE) 2 % solution Use as directed 15 mLs in the mouth or throat every 4 (four) hours as needed for mouth pain. Swish and spit Patient not taking: Reported on 04/28/2019 01/08/19   Rodell Perna A, PA-C  lidocaine (XYLOCAINE) 2 % solution Use as directed 15 mLs in the mouth or throat every 6 (six) hours as needed for mouth pain. 02/14/19   Corynn Solberg, Delice Bison, DO  metroNIDAZOLE (FLAGYL) 500 MG tablet Take 500 mg by mouth  daily as needed (bacterial infection).  11/21/18   [provider]  metroNIDAZOLE (METROGEL VAGINAL) 0.75 % vaginal gel Place 1 Applicatorful vaginally 2 (two) times daily. Patient not taking: Reported on 04/28/2019 02/13/17   Luvenia Redden, PA-C  oxyCODONE-acetaminophen (PERCOCET) 10-325 MG tablet Take 1 tablet by mouth 4 (four) times daily.  06/09/16   [provider]  propranolol (INDERAL) 10 MG tablet Take 10 mg by mouth 2 (two) times daily.  11/09/18   [provider]  traZODone (DESYREL) 50 MG tablet Take 50 mg by mouth at bedtime.  11/08/18   [provider]    ALLERGIES:  Allergies  Allergen Reactions  . Dust Mite Extract Shortness Of Breath  . Other Shortness Of Breath    Feathers, trees "almost everything outside"  . Pollen Extract Shortness Of Breath    SOCIAL HISTORY:  Social History   Tobacco Use  . Smoking status: Current Some Day Smoker    Packs/day: 0.25    Years: 15.00    Pack years: 3.75    Types: Cigarettes  . Smokeless tobacco: Never Used  . Tobacco comment: HAS CUT BACK  Substance Use Topics  . Alcohol use: No    Comment: on occasion    FAMILY HISTORY: Family History  Problem Relation Age of Onset  . Heart disease Father   . Hypertension Father   . Stroke Mother   . Hypertension Mother   . Heart attack Mother   . Breast cancer Mother   . Neuropathy Mother   . Arthritis Mother   . Asthma Mother   . Neuropathy Sister   . Arthritis Sister   . Asthma Sister     EXAM: BP 140/76 (BP Location: Right Arm)   Pulse 73   Temp 98 F (36.7 C) (Oral)  CONSTITUTIONAL: Alert and oriented and responds appropriately to questions. Well-appearing; well-nourished HEAD: Normocephalic EYES: Conjunctivae clear, pupils appear equal, EOMI ENT: normal nose; moist mucous membranes; No pharyngeal erythema or petechiae, no tonsillar hypertrophy or exudate, no uvular deviation, no unilateral swelling, no trismus or drooling, no  muffled voice, normal phonation, no stridor, no dental caries present, no drainable dental abscess noted, no Ludwig's angina, tongue sits flat in the bottom of the mouth, no angioedema, no facial erythema or warmth, no facial swelling; no pain with movement of the neck. NECK: Supple, no meningismus, no nuchal rigidity, no LAD  CARD: RRR; S1 and S2 appreciated; no murmurs, no clicks, no rubs, no gallops RESP: Normal chest excursion without splinting or tachypnea; breath sounds clear and equal bilaterally; no wheezes, no rhonchi, no rales, no hypoxia or respiratory distress, speaking full sentences ABD/GI: Normal bowel sounds; non-distended; soft, non-tender, no rebound, no guarding, no peritoneal signs, no hepatosplenomegaly BACK:  The back appears normal and  is non-tender to palpation, there is no CVA tenderness EXT: Normal ROM in all joints; non-tender to palpation; no edema; normal capillary refill; no cyanosis, no calf tenderness or swelling    SKIN: Normal color for age and race; warm; no rash NEURO: Moves all extremities equally PSYCH: Appears very anxious.  Grooming and personal hygiene are appropriate.  MEDICAL DECISION MAKING: Patient here with worsening viral symptoms.  She is very concerned that she has COVID-19.  She has no hypoxia, respiratory distress, increased work of breathing.  Discussed with patient that this time she does not meet criteria for testing from the emergency department as these tests are being reserved for patients that are being admitted to the hospital.  I think her symptoms are compounded by the fact she has severe anxiety.  She states that her cough is worsening.  We will repeat a chest x-ray today.  She also complains of nausea, diarrhea, abdominal pain.  Her abdominal exam is benign.  Will check labs, urine today.  Will treat with IV fluids, Zofran, Toradol for symptomatic relief.  ED PROGRESS: Patient's labs are unremarkable.  Chest x-ray today is clear.  Still  complaining of some chest burning even after Toradol.  She agrees to try a GI cocktail.  Urinalysis pending.   Urine does not appear infected.  She does have trace leukocyte esterase and 6-10 white blood cells but only few bacteria and many squamous cells.  Suspect this is a dirty catch.  She is not currently having urinary symptoms.  Only small amount of ketones seen and she has received IV fluids.  I discussed the importance of quarantining at home until has been without symptoms for 3 full days.  Discussed supportive care instructions and return precautions.  Patient comfortable with plan reports feeling much better after GI cocktail.   At this time, I do not feel there is any life-threatening condition present. I have reviewed and discussed all results (EKG, imaging, lab, urine as appropriate) and exam findings with patient/family. I have reviewed nursing notes and appropriate previous records.  I feel the patient is safe to be discharged home without further emergent workup and can continue workup as an outpatient as needed. Discussed usual and customary return precautions. Patient/family verbalize understanding and are comfortable with this plan.  Outpatient follow-up has been provided as needed. All questions have been answered.    EKG Interpretation  Date/Time:  Friday Apr 29 2019 02:26:08 EDT Ventricular Rate:  68 PR Interval:    QRS Duration: 102 QT Interval:  405 QTC Calculation: 431 R Axis:   31 Text Interpretation:  Sinus rhythm Anterior infarct, old nscllt Confirmed by Shafiq Larch, Cyril Mourning [phone]) on 04/29/2019 2:38:50 AM          Tyana Butzer, Delice Bison, DO 04/29/19 [id number]

## 2019-04-29 NOTE — Discharge Instructions (Signed)
You may alternate Tylenol 1000 mg every 6 hours as needed for pain and Ibuprofen 800 mg every 8 hours as needed for pain.  Please take Ibuprofen with food.  You may use over-the-counter medication such as guaifenesin and dextromethorphan to help with cough.  You do not need antibiotics at this time.  You should quarantine until you have not had symptoms for at least 3 full days.

## 2019-05-02 ENCOUNTER — Telehealth: Payer: Self-pay | Admitting: Internal Medicine

## 2019-05-02 NOTE — Telephone Encounter (Signed)
Returned call and confirmed appt with patient. Pt did not did anything further.

## 2019-05-03 ENCOUNTER — Encounter: Payer: Self-pay | Admitting: Internal Medicine

## 2019-05-03 ENCOUNTER — Ambulatory Visit (INDEPENDENT_AMBULATORY_CARE_PROVIDER_SITE_OTHER): Payer: Medicaid Other | Admitting: Internal Medicine

## 2019-05-03 ENCOUNTER — Other Ambulatory Visit: Payer: Self-pay

## 2019-05-03 VITALS — BP 130/80 | HR 76 | Temp 97.7°F | Ht 69.0 in | Wt 201.6 lb

## 2019-05-03 DIAGNOSIS — R058 Other specified cough: Secondary | ICD-10-CM

## 2019-05-03 DIAGNOSIS — R05 Cough: Secondary | ICD-10-CM

## 2019-05-03 DIAGNOSIS — R9389 Abnormal findings on diagnostic imaging of other specified body structures: Secondary | ICD-10-CM | POA: Insufficient documentation

## 2019-05-03 HISTORY — DX: Abnormal findings on diagnostic imaging of other specified body structures: R93.89

## 2019-05-03 NOTE — Assessment & Plan Note (Signed)
05/03/2019   Walked RA  2 laps @  approx 251ft each @ fast pace  stopped due to  End of study with sats 95% at end    Most likely the changes on cxr are from prior asp events or smoking related RBILD but in both instances the problem is extrinsic to the lung (asp or gastric contents or inhalation of cigs)   rec work on correcting swallowing issues/ gerd as per UACS and maintain off cigs   If ex tol trends down off cigs needs to return for full pfts assuming  COVID - 19 restrictions have been lifted.    Pulmonary fu is prn    I had an extended discussion with the patient   reviewing all relevant studies completed to date and  lasting 15 to 20 minutes of a 25 minute visit  which included directly observing ambulatory 02 saturation study documented in a/p section of  today's  office note.  Each maintenance medication was reviewed in detail including most importantly the difference between maintenance and prns and under what circumstances the prns are to be triggered using an action plan format that is not reflected in the computer generated alphabetically organized AVS.     Please see AVS for specific instructions unique to this visit that I personally wrote and verbalized to the the pt in detail and then reviewed with pt  by my nurse highlighting any changes in therapy recommended at today's visit .

## 2019-05-03 NOTE — Progress Notes (Signed)
Subjective:    Patient ID: Raven [name], female    DOB: 08/01/1980   MRN: [id number]    Brief patient profile:  17  yobf  Quit smoking completely 04/29/2019  never really healthy as child attributed to palpitations / allergies then started smoking age 38 by age 56 started having breathing problems worse p tonsilectomy in 2002  referred to pulmonary clinic by ER 08/08/2014     History of Present Illness  08/08/2014 1st [facility] office visit/   Chief Complaint  Patient presents with  . Pulmonary Consult    Self referral. Pt states that she has been having problems with aspirating food since tonsillectomy in 2002. She has been dxed with aspiration PNA x 2 already in 2015.  She c/o cough and SOB. Cough is esp worse after eating and at night. She states that she wakes up gasping for air.   not happy x 4-5 y daily difficulty with swallowing and when lie down immediately gasping for air  On acei chronically but denies taking it - says meds listed never correspond to what she actually was taking Has used saba, helps very little if at all  No better on qvar so stopped it  rec Pantoprazole 20 mg Take 30-60 min before first meal of the day and pepcid 20 mg at bedtime  GERD diet        09/05/2014 f/u ov/ re: no meds on hand  Chief Complaint  Patient presents with  . Follow-up    Pt states she has not been coughing as much as when last here. No new co's today. She is using albuterol approx 3 times per wk.    Stopped pantoprazole due to abd cramping, taking pepcid at hs with less coughing then Not limited by breathing from desired activities   Mostly concerned with hoarsness/ already eval by [facility] and not willing to return - convinced she has lump in throat.  rec Take the pepcid 20 mg after bfast and after supper Please see patient coordinator before you leave today  to schedule ENT eval by [facility] Depomedrol 120 mg today  The key is to stop smoking completely  before smoking completely stops you!      10/20/2017  Extended f/u ov/ re: re -establish uacs / no meds on hand as req  Chief Complaint  Patient presents with  . Follow-up    Pt states she has had PNA x 2 since she was here last in 2015. Pt states that she is still having trouble swallowing and is having hoarseness.  She states also having alot of SOB mainly when eating and drinking. She is also coughing, esp after she eats.   cough is worst at hs every noct x 3 months  With freq vomiting (she thinks she's coughing up food she's aspirated on near nightly  basis  Sob mainly when coughing/ also overt HB symptoms and not sleeping with bed blocks as rec Has not f[facility]u at [facility], never saw Dr Joya Gaskins there.  rec Please see patient coordinator before you leave today  to schedule evaluation by Dr Carol Ada but take all active medications with you. The key is to stop smoking completely before smoking completely stops you!  Please remember to go to the  x-ray department downstairs in the basement  for your tests - we will call you with the results when they are available. GERD diet    04/29/2019 ER eval  Cough, burning chest pain with coughing, abdominal  pain, diarrhea  38 year old female with history of achalasia, vocal cord dysfunction status post tonsillectomy in 2002, panic attacks, hypertension who presents to the emergency department with 2 to 3 days worth of cough, congestion.  Describes the cough is a dry cough.  No known fevers or chills.  Has had burning chest pain with coughing.  Complains of some right upper quadrant abdominal pain, nausea and diarrhea.  No vomiting.  Has had a cholecystectomy.  States she has been isolating at home with her 49 year old son but states she was placed in jail several days ago after she was arrested because of a warrant for a failure to appear in court charge.  She is not sure if anyone was sick in jail with her.  She is concerned that she has coronavirus.   Was seen here yesterday and had a chest x-ray that showed acute on chronic increased pulmonary interstitial markings concerning for possible viral/atypical respiratory infection.  She states since that time her symptoms have worsened. rec Did not meet criteria for Covid-19 testing  F/u Pulmonary     05/03/2019  Acute extended  office eval /  Post er eval 04/29/2019  Chief Complaint  Patient presents with  . Acute Visit    increased SOB, cough and dysphagia x 2 months.   Dyspnea:  Not limited by breathing from desired activities   Cough: worse at hs harsh non productive coughing/ gagging  Sleeping: on side / bed flat  SABA use:  None  02: none   No obvious other patterns in  day to day or daytime variability or assoc excess/ purulent sputum or mucus plugs or hemoptysis or cp or chest tightness, subjective wheeze or overt sinus   symptoms.   None  without nocturnal  or early am exacerbation  of respiratory  c/o's or need for noct saba. Also denies any obvious fluctuation of symptoms with weather or environmental changes or other aggravating or alleviating factors except as outlined above   No unusual exposure hx or h/o childhood pna/ asthma or knowledge of premature birth.  Current Allergies, Complete Past Medical History, Past Surgical History, Family History, and Social History were reviewed in [employer] record.  ROS  The following are not active complaints unless bolded Hoarseness, sore throat, dysphagia, dental problems, itching, sneezing,  nasal congestion or discharge of excess mucus or purulent secretions, ear ache,   fever, chills, sweats, unintended wt loss or wt gain, classically pleuritic or exertional cp,  orthopnea pnd or arm/hand swelling  or leg swelling, presyncope, palpitations, abdominal pain, anorexia, nausea, vomiting, diarrhea  or change in bowel habits or change in bladder habits, change in stools or change in urine, dysuria, hematuria,  rash,  arthralgias, visual complaints, headache, numbness, weakness or ataxia or problems with walking or coordination,  change in mood= anxious  or  memory.        Current Meds  Medication Sig  . clonazePAM (KLONOPIN) 2 MG tablet Take 1 tablet (2 mg total) by mouth every 8 (eight) hours.  Marland Kitchen dicyclomine (BENTYL) 10 MG capsule Take 1-2 every 8 hours as needed (Patient taking differently: Take 10 mg by mouth 3 (three) times daily as needed for spasms. Take 1-2 every 8 hours as needed)  . FLUoxetine (PROZAC) 20 MG/5ML solution Take 10 mg by mouth daily.   . hydrOXYzine (ATARAX/VISTARIL) 50 MG tablet Take 50 mg by mouth every 6 (six) hours as needed for anxiety.   . lansoprazole (PREVACID SOLUTAB) 30 MG  disintegrating tablet Take 1 tablet (30 mg total) by mouth daily.  Marland Kitchen LATUDA 60 MG TABS Take 1 tablet by mouth daily.   Marland Kitchen lidocaine (XYLOCAINE) 2 % solution Use as directed 15 mLs in the mouth or throat every 4 (four) hours as needed for mouth pain. Swish and spit  . oxyCODONE-acetaminophen (PERCOCET) 10-325 MG tablet Take 1 tablet by mouth 4 (four) times daily.   . traZODone (DESYREL) 50 MG tablet Take 50 mg by mouth at bedtime.                            Objective:   Physical Exam  05/03/2019          201 10/20/2017      202 09/05/2014          196     08/08/14 197 lb (89.359 kg)  05/10/14 195 lb (88.451 kg)  03/27/14 195 lb 4 oz (88.565 kg)      hoarse amb bf freq throat clearing    HEENT: nl dentition, turbinates bilaterally, and oropharynx. Nl external ear canals without cough reflex   NECK :  without JVD/Nodes/TM/ nl carotid upstrokes bilaterally   LUNGS: no acc muscle use,  Nl contour chest which is clear to A and P bilaterally without cough on insp or exp maneuvers   CV:  RRR  no s3 or murmur or increase in P2, and no edema   ABD:  soft and nontender with nl inspiratory excursion in the supine position. No bruits or organomegaly appreciated, bowel sounds nl  MS:  Nl  gait/ ext warm without deformities, calf tenderness, cyanosis or clubbing No obvious joint restrictions   SKIN: warm and dry without lesions    NEURO:  alert, approp, nl sensorium with  no motor or cerebellar deficits apparent.       Dg Es 01/27/2019 1. Spontaneous gastroesophageal reflux. 2. Otherwise, normal esophagram.    I personally reviewed images and agree with radiology impression as follows:  CXR:   04/29/19 No acute cardiopulmonary abnormality.         Assessment & Plan:

## 2019-05-03 NOTE — Assessment & Plan Note (Signed)
Onset  2002 p tonsilectomy  -Spirometry wnl  08/08/14  - referred to [place] [facility] 09/05/2014 > inconclusive w/u by ENT / GI s f/u ov by either since 2016 > referred  Back to Dr Joya Gaskins 10/20/2017  - Dg Es 01/27/2019  Spontaneous gastroesophageal reflux. - 05/03/2019 pulmonary re-eval = ppi bid ac/ elevate hob and 1st gen H1 blockers per guidelines with f/u [facility] / [facility]      Of the three most common causes of  Sub-acute / recurrent or chronic cough, only one (GERD)  can actually contribute to/ trigger  the other two (asthma and post nasal drip syndrome)  and perpetuate the cylce of cough.  While not intuitively obvious, many patients with chronic low grade reflux do not cough until there is a primary insult that disturbs the protective epithelial barrier and exposes sensitive nerve endings.   This is typically viral but can due to PNDS and  either may apply here.   The point is that once this occurs, it is difficult to eliminate the cycle  using anything but a maximally effective acid suppression regimen at least in the short run, accompanied by an appropriate diet to address non acid GERD and control / eliminate all pnds with 1st gen H1 blockers per guidelines  And f/u with ENT at [facility] and [facility]

## 2019-05-03 NOTE — Patient Instructions (Addendum)
Prevacid  30 mg  Take Take 30- 60 min before each meal   GERD (REFLUX)  is an extremely common cause of respiratory symptoms just like yours , many times with no obvious heartburn at all.    It can be treated with medication, but also with lifestyle changes including elevation of the head of your bed (ideally with 6 -8inch blocks under the headboard of your bed),  Smoking cessation, avoidance of late meals, excessive alcohol, and avoid fatty foods, chocolate, peppermint, colas, red wine, and acidic juices such as orange juice.  NO MINT OR MENTHOL PRODUCTS SO NO COUGH DROPS  USE SUGARLESS CANDY INSTEAD (Jolley ranchers or Stover's or Life Savers) or even ice chips will also do - the key is to swallow to prevent all throat clearing. NO OIL BASED VITAMINS - use powdered substitutes.  Avoid fish oil when coughing.   For drainage / throat tickle try take CHLORPHENIRAMINE  4 mg  (Chlortab 4mg   at [employer] should be easiest to find in the green box)  take one every 4 hours as needed - available over the counter- may cause drowsiness so start with just a   dose or two one hour before bedtime and see how you tolerate it before trying in daytime

## 2019-05-04 ENCOUNTER — Encounter: Payer: Self-pay | Admitting: General Surgery

## 2019-05-05 ENCOUNTER — Ambulatory Visit (INDEPENDENT_AMBULATORY_CARE_PROVIDER_SITE_OTHER): Payer: Medicaid Other | Admitting: Gastroenterology

## 2019-05-05 ENCOUNTER — Encounter: Payer: Self-pay | Admitting: Gastroenterology

## 2019-05-05 ENCOUNTER — Other Ambulatory Visit: Payer: Self-pay

## 2019-05-05 VITALS — Ht 69.0 in | Wt 201.0 lb

## 2019-05-05 DIAGNOSIS — K582 Mixed irritable bowel syndrome: Secondary | ICD-10-CM

## 2019-05-05 DIAGNOSIS — K219 Gastro-esophageal reflux disease without esophagitis: Secondary | ICD-10-CM

## 2019-05-05 DIAGNOSIS — T402X5A Adverse effect of other opioids, initial encounter: Principal | ICD-10-CM

## 2019-05-05 DIAGNOSIS — K5903 Drug induced constipation: Secondary | ICD-10-CM

## 2019-05-05 DIAGNOSIS — R1310 Dysphagia, unspecified: Secondary | ICD-10-CM

## 2019-05-05 DIAGNOSIS — R109 Unspecified abdominal pain: Secondary | ICD-10-CM | POA: Diagnosis not present

## 2019-05-05 MED ORDER — LINACLOTIDE 290 MCG PO CAPS: 290.0000 ug | ORAL_CAPSULE | Freq: Every day | ORAL | 3 refills | Status: DC

## 2019-05-05 NOTE — Progress Notes (Signed)
Raven [name]    [id number]    [birth year]  Primary Care Physician:Reese, Zadie Cleverly, MD  Referring Physician: Lin Landsman, [address], [place] [address]  This service was provided via audio and video telemedicine (Doximity) due to [place] 19 pandemic.  Patient location: Home Provider location: Office Used 2 patient identifiers to confirm the correct person. Explained the limitations in evaluation and management via telemedicine. Patient is aware of potential medical charges for this visit.  Patient consented to this virtual visit.  The persons participating in this telemedicine service were myself and the patient   Chief complaint: Constipation, abdominal pain HPI:  38 year old female with chronic dysphagia, vocal cord paralysis, last seen by Nicoletta Ba January 2020.  She has had EGDs by Dr. Deatra Ina and Botox injections.   Barium swallow January 27, 2019 showed normal esophagram with no significant dysmotility or esophageal stricture, spontaneous gastroesophageal reflux. Esophageal manometry February 2020: No abnormality She is followed by ENT at [facility]  She is followed by Dr. Havery Moros, was accidentally scheduled with me for this visit.  She had bad lung infection, she was seen in the ER last week, was diagnosed with atypical viral pneumonia but felt she did not meet the criteria for covid-19 testing and was discharged home.  She quit smoking since then.  She is feeling somewhat better this week.  She is complaining of abdominal cramping, worse when she does not have a bowel movement for 5 to 7 days, has alternating constipation and diarrhea. Most weeks she does not have a bowel movement more than once a week and when she goes, usually only passes small pellets alternates with occasional diarrhea  She is taking Percocet every 6 hours for back pain.   Currently not taking any laxatives regularly.   She has chronic dysphagia, barium  esophagram showed gastroesophageal reflux otherwise unremarkable study with no peristaltic abnormality or stricture.  Esophageal manometry with no significant abnormality.  She follows up with ENT at [facility] for oropharyngeal dysphagia  EGD April 2015 by Dr. Deatra Ina was unremarkable Colonoscopy 2015 with removal of one tubular adenoma and sigmoid diverticulosis  Outpatient Encounter Medications as of 05/05/2019  Medication Sig  . clonazePAM (KLONOPIN) 2 MG tablet Take 1 tablet (2 mg total) by mouth every 8 (eight) hours.  Marland Kitchen dicyclomine (BENTYL) 10 MG capsule Take 1-2 every 8 hours as needed (Patient taking differently: Take 10 mg by mouth 3 (three) times daily as needed for spasms. Take 1-2 every 8 hours as needed)  . FLUoxetine (PROZAC) 20 MG/5ML solution Take 10 mg by mouth daily.   . hydrOXYzine (ATARAX/VISTARIL) 50 MG tablet Take 50 mg by mouth every 6 (six) hours as needed for anxiety.   . lansoprazole (PREVACID SOLUTAB) 30 MG disintegrating tablet Take 1 tablet (30 mg total) by mouth daily.  Marland Kitchen LATUDA 60 MG TABS Take 1 tablet by mouth daily.   Marland Kitchen lidocaine (XYLOCAINE) 2 % solution Use as directed 15 mLs in the mouth or throat every 4 (four) hours as needed for mouth pain. Swish and spit  . oxyCODONE-acetaminophen (PERCOCET) 10-325 MG tablet Take 1 tablet by mouth 4 (four) times daily.   . traZODone (DESYREL) 50 MG tablet Take 50 mg by mouth at bedtime.    No facility-administered encounter medications on file as of 05/05/2019.     Allergies as of 05/05/2019 - Review Complete 05/05/2019  Allergen Reaction Noted  . Dust mite extract  Shortness Of Breath 01/28/2015  . Other Shortness Of Breath 01/19/2015  . Pollen extract Shortness Of Breath 01/28/2015    Past Medical History:  Diagnosis Date  . Abnormal Pap smear   . Achalasia    esphog stretched several times  . Anxiety   . Arthritis   . Asthma    rarely uses inhaler  . Blood transfusion 01/2006   [place]  . Cardiomyopathy ([place])    . Constipation due to opioid therapy   . Depression   . Dysrhythmia   . Fibromyalgia   . GERD (gastroesophageal reflux disease)    no meds - diet controlled  . Heart murmur   . Hiatal hernia   . History of abnormal Pap smear   . History of chlamydia 1990's  . HSV-2 infection   . HTN (hypertension)   . Occipital neuralgia of right side 04/21/2017  . Pneumonia 05/2013  . PVC's (premature ventricular contractions)   . Shortness of breath dyspnea   . Status post dilation of esophageal narrowing   . Vaginal Pap smear, abnormal     Past Surgical History:  Procedure Laterality Date  . ADENOIDECTOMY  12/2002  . BLADDER SUSPENSION  06/11/2012   Procedure: TRANSVAGINAL TAPE (TVT) PROCEDURE;  Surgeon: Delice Lesch, MD;  Location: [facility];  Service: Gynecology;  Laterality: N/A;  Tension Free Vaginal Tape/cystoscopy  . BREAST SURGERY  07/2001   tumor removed right breast  . CHOLECYSTECTOMY N/A 01/23/2015   Procedure: LAPAROSCOPIC CHOLECYSTECTOMY ;  Surgeon: Fanny Skates, MD;  Location: [facility];  Service: General;  Laterality: N/A;  case booked with cholangiogram but MD did not complete intraoperative cholangiogram   . CYSTOSCOPY  06/11/2012   Procedure: CYSTOSCOPY;  Surgeon: Delice Lesch, MD;  Location: [facility];  Service: Gynecology;  Laterality: N/A;  . DILATION AND CURETTAGE OF UTERUS  1999  . DILATION AND CURETTAGE OF UTERUS  01/2006   retained products  . ESOPHAGEAL MANOMETRY N/A 02/23/2019   Procedure: ESOPHAGEAL MANOMETRY (EM);  Surgeon: Mauri Pole, MD;  [facility];  Service: Endoscopy;  Laterality: N/A;  . ESOPHAGOGASTRODUODENOSCOPY     with esophageal dilation x 3  . FOREIGN BODY REMOVAL  06/11/2012   Procedure: REMOVAL FOREIGN BODY EXTREMITY;  Surgeon: Delice Lesch, MD;  Location: [facility];  Service: Gynecology;  Laterality: Right;  Removal of Implanon   . LAPAROSCOPIC TUBAL LIGATION  06/11/2012   Procedure: LAPAROSCOPIC TUBAL LIGATION;  Surgeon: Delice Lesch, MD;  Location: [facility];  Service: Gynecology;  Laterality: Bilateral;  Laparoscopic Bilateral Fulgeration  . TONSILLECTOMY    . TUBAL LIGATION      Family History  Problem Relation Age of Onset  . Heart disease Father   . Hypertension Father   . Stroke Mother   . Hypertension Mother   . Heart attack Mother   . Breast cancer Mother   . Neuropathy Mother   . Arthritis Mother   . Asthma Mother   . Neuropathy Sister   . Arthritis Sister   . Asthma Sister     Social History   Socioeconomic History  . Marital status: Single    Spouse name: Not on file  . Number of children: 2  . Years of education: College  . Highest education level: Not on file  Occupational History  . Not on file  Social Needs  . Financial resource strain: Not on file  . Food insecurity:    Worry: Not on file  Inability: Not on file  . Transportation needs:    Medical: Not on file    Non-medical: Not on file  Tobacco Use  . Smoking status: Former Smoker    Packs/day: 0.25    Years: 15.00    Pack years: 3.75    Types: Cigarettes    Last attempt to quit: 04/29/2019    Years since quitting: 0.0  . Smokeless tobacco: Never Used  Substance and Sexual Activity  . Alcohol use: No    Comment: on occasion  . Drug use: No  . Sexual activity: Yes    Birth control/protection: Surgical  Lifestyle  . Physical activity:    Days per week: Not on file    Minutes per session: Not on file  . Stress: Not on file  Relationships  . Social connections:    Talks on phone: Not on file    Gets together: Not on file    Attends religious service: Not on file    Active member of club or organization: Not on file    Attends meetings of clubs or organizations: Not on file    Relationship status: Not on file  . Intimate partner violence:    Fear of current or ex partner: Not on file    Emotionally abused: Not on file    Physically abused: Not on file    Forced sexual activity: Not on file  Other Topics  Concern  . Not on file  Social History Narrative   Lives with fiance and children   Caffeine use:  Hot ginger tea- daily   Left-handed      Review of systems: Review of Systems as per HPI All other systems reviewed and are negative.   Physical Exam: Vitals were not taken and physical exam was not performed during this virtual visit.  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 Havery Moros in 4 to 6 weeks     K. Denzil Magnuson , MD   CC: Lin Landsman, MD

## 2019-05-05 NOTE — Patient Instructions (Addendum)
Start Linzess 290 mcg daily, 90-day supply.  Instructed patient to open the capsule and mix contents in applesauce.  She has chronic dysphagia unclear etiology  Constipation handout  Continue high-fiber diet and increase fluid intake  Continue with dicyclomine 10 mg every 6 hours as needed, please send refill for 90 days  Follow-up with Dr. Havery Moros in 4 to 6 weeks, tele visit  You will need to call back for this appointment     I appreciate the  opportunity to care for you  Thank You   Harl Bowie , MD

## 2019-05-12 ENCOUNTER — Other Ambulatory Visit: Payer: Self-pay

## 2019-05-12 ENCOUNTER — Emergency Department (HOSPITAL_COMMUNITY)
Admission: EM | Admit: 2019-05-12 | Discharge: 2019-05-12 | Disposition: A | Discharge status: Home / Self Care | Payer: Medicaid Other | Attending: Emergency Medicine | Admitting: Emergency Medicine

## 2019-05-12 ENCOUNTER — Telehealth: Payer: Self-pay | Admitting: Gastroenterology

## 2019-05-12 ENCOUNTER — Encounter (HOSPITAL_COMMUNITY): Payer: Self-pay

## 2019-05-12 DIAGNOSIS — Z87891 Personal history of nicotine dependence: Secondary | ICD-10-CM | POA: Diagnosis not present

## 2019-05-12 DIAGNOSIS — Z8709 Personal history of other diseases of the respiratory system: Secondary | ICD-10-CM | POA: Insufficient documentation

## 2019-05-12 DIAGNOSIS — R4702 Dysphasia: Secondary | ICD-10-CM | POA: Insufficient documentation

## 2019-05-12 DIAGNOSIS — R1310 Dysphagia, unspecified: Secondary | ICD-10-CM

## 2019-05-12 DIAGNOSIS — Z79899 Other long term (current) drug therapy: Secondary | ICD-10-CM | POA: Insufficient documentation

## 2019-05-12 DIAGNOSIS — I10 Essential (primary) hypertension: Secondary | ICD-10-CM | POA: Insufficient documentation

## 2019-05-12 MED ORDER — ALUM & MAG HYDROXIDE-SIMETH 200-200-20 MG/5ML PO SUSP
30.0000 mL | Freq: Once | ORAL | Status: AC
  Administered 2019-05-12: 04:00:00 30 mL via ORAL
  Filled 2019-05-12: qty 30

## 2019-05-12 MED ORDER — CLONAZEPAM 0.5 MG PO TABS
2.0000 mg | ORAL_TABLET | Freq: Once | ORAL | Status: AC
  Administered 2019-05-12: 2 mg via ORAL
  Filled 2019-05-12: qty 4

## 2019-05-12 MED ORDER — LIDOCAINE VISCOUS HCL 2 % MT SOLN
15.0000 mL | Freq: Once | OROMUCOSAL | Status: AC
  Administered 2019-05-12: 15 mL via ORAL
  Filled 2019-05-12: qty 15

## 2019-05-12 NOTE — ED Provider Notes (Signed)
[facility] EMERGENCY DEPARTMENT Provider Note   CSN: [id number] Arrival date & time: 05/12/19  0300    History   Chief Complaint Chief Complaint  Patient presents with  . Dysphagia    HPI Raven [name] is a 38 y.o. female.     Patient with a history of dysphagia, achalasia, esophageal stricture presents with symptoms of dysphagia. She felt her throat was closing prompting emergency evaluation. No vomiting. She is managing her secretions. No rash, facial/intraoral swelling. She feels short of breath. History of anxiety and panic and currently out of her medication. She reports she has an appointment later this morning with her doctor for refills.   The history is provided by the patient. No language interpreter was used.    Past Medical History:  Diagnosis Date  . Abnormal Pap smear   . Achalasia    esphog stretched several times  . Anxiety   . Arthritis   . Asthma    rarely uses inhaler  . Blood transfusion 01/2006   [place]  . Cardiomyopathy ([place])   . Constipation due to opioid therapy   . Depression   . Dysrhythmia   . Fibromyalgia   . GERD (gastroesophageal reflux disease)    no meds - diet controlled  . Heart murmur   . Hiatal hernia   . History of abnormal Pap smear   . History of chlamydia 1990's  . HSV-2 infection   . HTN (hypertension)   . Occipital neuralgia of right side 04/21/2017  . Pneumonia 05/2013  . PVC's (premature ventricular contractions)   . Shortness of breath dyspnea   . Status post dilation of esophageal narrowing   . Vaginal Pap smear, abnormal     Patient Active Problem List   Diagnosis Date Noted  . Abnormal CXR 05/03/2019  . Globus sensation   . Chronic bilateral low back pain 09/07/2018  . Acute pain of both knees 09/07/2018  . 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 01/18/2018  . Chronically on opiate therapy 01/18/2018  . Excessive daytime sleepiness 01/18/2018  .  Vivid dream 01/18/2018  . 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 Occipital neuralgia of right side 04/21/2017  . Fibromyalgia 04/21/2017  . UTI (urinary tract infection) 02/12/2017  . Gallstones and inflammation of gallbladder without obstruction 01/23/2015  . S/P laparoscopic surgery 01/23/2015  . Upper airway cough syndrome 09/05/2014  . Cigarette smoker 08/09/2014  . Dyspnea 08/08/2014  . Abdominal pain, left lower quadrant 03/27/2014  . Cardiomyopathy, idiopathic ([place]) 01/09/2014  . History of chlamydia 01/09/2013  . 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  . History of abnormal Pap smear 01/09/2013  . Achalasia   . Abnormal Pap smear   . Depression   . HSV-2 infection   . Heart palpitations   . Heart murmur   . Anxiety   . GERD (gastroesophageal reflux disease)   . 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 PVC's (premature ventricular contractions) 05/19/2011  . Palpitation 04/11/2011  . ESOPHAGEAL STRICTURE 08/10/2009  . Dysphagia 08/10/2009  . Esophageal reflux 11/30/2008  . Unspecified constipation 11/30/2008  . DEPRESSION 10/09/2008  . ASTHMA 10/09/2008    Past Surgical History:  Procedure Laterality Date  . ADENOIDECTOMY  12/2002  . BLADDER SUSPENSION  06/11/2012   Procedure: TRANSVAGINAL TAPE (TVT) PROCEDURE;  Surgeon: Delice Lesch, MD;  Location: [facility];  Service: Gynecology;  Laterality: N/A;  Tension Free Vaginal  Tape/cystoscopy  . BREAST SURGERY  07/2001   tumor removed right breast  . CHOLECYSTECTOMY N/A 01/23/2015   Procedure: LAPAROSCOPIC CHOLECYSTECTOMY ;  Surgeon: Fanny Skates, MD;  Location: [facility];  Service: General;  Laterality: N/A;  case booked with cholangiogram but MD did not complete intraoperative cholangiogram   . CYSTOSCOPY  06/11/2012   Procedure: CYSTOSCOPY;  Surgeon: Delice Lesch, MD;  Location: [facility];  Service: Gynecology;  Laterality: N/A;  . DILATION AND CURETTAGE OF UTERUS  1999   . DILATION AND CURETTAGE OF UTERUS  01/2006   retained products  . ESOPHAGEAL MANOMETRY N/A 02/23/2019   Procedure: ESOPHAGEAL MANOMETRY (EM);  Surgeon: Mauri Pole, MD;  [facility];  Service: Endoscopy;  Laterality: N/A;  . ESOPHAGOGASTRODUODENOSCOPY     with esophageal dilation x 3  . FOREIGN BODY REMOVAL  06/11/2012   Procedure: REMOVAL FOREIGN BODY EXTREMITY;  Surgeon: Delice Lesch, MD;  Location: [facility];  Service: Gynecology;  Laterality: Right;  Removal of Implanon   . LAPAROSCOPIC TUBAL LIGATION  06/11/2012   Procedure: LAPAROSCOPIC TUBAL LIGATION;  Surgeon: Delice Lesch, MD;  Location: [facility];  Service: Gynecology;  Laterality: Bilateral;  Laparoscopic Bilateral Fulgeration  . TONSILLECTOMY    . TUBAL LIGATION       OB History    Gravida  5   Para  2   Term  2   Preterm  0   AB  2   Living  2     SAB  2   TAB      Ectopic      Multiple      Live Births               Home Medications    Prior to Admission medications   Medication Sig Start Date End Date Taking? Authorizing Provider  clonazePAM (KLONOPIN) 2 MG tablet Take 1 tablet (2 mg total) by mouth every 8 (eight) hours. 02/14/19   Ward, Delice Bison, DO  dicyclomine (BENTYL) 10 MG capsule Take 1-2 every 8 hours as needed Patient taking differently: Take 10 mg by mouth 3 (three) times daily as needed for spasms. Take 1-2 every 8 hours as needed 09/03/18   Armbruster, Carlota Raspberry, MD  FLUoxetine (PROZAC) 20 MG/5ML solution Take 10 mg by mouth daily.  01/13/18   [provider]  hydrOXYzine (ATARAX/VISTARIL) 50 MG tablet Take 50 mg by mouth every 6 (six) hours as needed for anxiety.  11/08/18   [provider]  lansoprazole (PREVACID SOLUTAB) 30 MG disintegrating tablet Take 1 tablet (30 mg total) by mouth daily. 09/03/18   Armbruster, Carlota Raspberry, MD  LATUDA 60 MG TABS Take 1 tablet by mouth daily.  11/08/18   [provider]  lidocaine (XYLOCAINE) 2 % solution Use  as directed 15 mLs in the mouth or throat every 4 (four) hours as needed for mouth pain. Swish and spit 01/08/19   Fawze, Mina A, PA-C  linaclotide (LINZESS) 290 MCG CAPS capsule Take 1 capsule (290 mcg total) by mouth daily before breakfast. 05/05/19   Nandigam, Venia Minks, MD  oxyCODONE-acetaminophen (PERCOCET) 10-325 MG tablet Take 1 tablet by mouth 4 (four) times daily.  06/09/16   [provider]  traZODone (DESYREL) 50 MG tablet Take 50 mg by mouth at bedtime.  11/08/18   [provider]    Family History Family History  Problem Relation Age of Onset  . Heart disease Father   . Hypertension Father   .  Stroke Mother   . Hypertension Mother   . Heart attack Mother   . Breast cancer Mother   . Neuropathy Mother   . Arthritis Mother   . Asthma Mother   . Neuropathy Sister   . Arthritis Sister   . Asthma Sister     Social History Social History   Tobacco Use  . Smoking status: Former Smoker    Packs/day: 0.25    Years: 15.00    Pack years: 3.75    Types: Cigarettes    Last attempt to quit: 04/29/2019    Years since quitting: 0.0  . Smokeless tobacco: Never Used  Substance Use Topics  . Alcohol use: No    Comment: on occasion  . Drug use: No     Allergies   Dust mite extract; Other; and Pollen extract   Review of Systems Review of Systems  Constitutional: Negative for fever.  HENT: Negative for trouble swallowing.        See HPI.  Respiratory: Positive for shortness of breath.   Gastrointestinal: Negative for vomiting.  Musculoskeletal: Negative for neck pain.  Psychiatric/Behavioral: The patient is nervous/anxious.      Physical Exam Updated Vital Signs BP 140/80 (BP Location: Right Arm)   Pulse 76   Temp 97.8 F (36.6 C) (Oral)   Resp 18   SpO2 100%   Physical Exam Constitutional:      Appearance: She is well-developed.  HENT:     Head: Normocephalic.     Comments: No facial swelling of face, lips, tongue or oropharynx.    Nose:  Nose normal.     Mouth/Throat:     Mouth: Mucous membranes are moist.     Pharynx: Oropharynx is clear.  Eyes:     Conjunctiva/sclera: Conjunctivae normal.  Neck:     Musculoskeletal: Normal range of motion and neck supple.  Cardiovascular:     Rate and Rhythm: Normal rate and regular rhythm.     Heart sounds: No murmur.  Pulmonary:     Effort: Pulmonary effort is normal.     Breath sounds: Normal breath sounds. No stridor. No wheezing, rhonchi or rales.  Abdominal:     General: Bowel sounds are normal.     Palpations: Abdomen is soft.     Tenderness: There is no abdominal tenderness. There is no guarding or rebound.  Musculoskeletal: Normal range of motion.  Skin:    General: Skin is warm and dry.     Findings: No rash.  Neurological:     Mental Status: She is alert and oriented to person, place, and time.  Psychiatric:        Mood and Affect: Mood is anxious.      ED Treatments / Results  Labs (all labs ordered are listed, but only abnormal results are displayed) Labs Reviewed - No data to display  EKG None  Radiology No results found.  Procedures Procedures (including critical care time)  Medications Ordered in ED Medications  alum & mag hydroxide-simeth (MAALOX/MYLANTA) 200-200-20 MG/5ML suspension 30 mL (has no administration in time range)    And  lidocaine (XYLOCAINE) 2 % viscous mouth solution 15 mL (has no administration in time range)  clonazePAM (KLONOPIN) tablet 2 mg (has no administration in time range)     Initial Impression / Assessment and Plan / ED Course  I have reviewed the triage vital signs and the nursing notes.  Pertinent labs & imaging results that were available during my care of the patient  were reviewed by me and considered in my medical decision making (see chart for details).        Patient to ED with symptoms of fullness in her throat. No symptoms of allergic reaction. She reports symptoms are similar to her chronic symptoms  of dysphagia. No vomiting. No fever.   Exam is unremarkable. VSS. She is anxious appearing.   GI cocktail and single dose of Clonazepam provided. She is able to swallow these medications without difficulty.   She is encouraged to see her per scheduled visit later this morning.  Final Clinical Impressions(s) / ED Diagnoses   Final diagnoses:  None   1. Chronic dysphagia 2. Anxiety  ED Discharge Orders    None       Dennie Bible 05/12/19 0708    Palumbo, April, MD 05/12/19 [phone]

## 2019-05-12 NOTE — Telephone Encounter (Signed)
Patient did not complain of dysphasia during the visit on May 05, 2019.  She has chronic dysphagia and has had extensive work-up in the past.  She is actually Dr. Doyne Keel patient, was accidentally scheduled with me and has follow-up scheduled with Dr. Havery Moros.  Will forward this message to him.

## 2019-05-12 NOTE — Telephone Encounter (Signed)
Please advise 

## 2019-05-12 NOTE — ED Notes (Signed)
Patient verbalizes understanding of discharge instructions. Opportunity for questioning and answers were provided. Armband removed by staff, pt discharged from ED ambulatory.   

## 2019-05-12 NOTE — Telephone Encounter (Signed)
Spoke to patient ok with Doximity visit

## 2019-05-12 NOTE — ED Triage Notes (Signed)
Pt arrived via [facility]; pt from home w/ c/o "feeling like throat closing up." Pt states that she has had to have her esophagus stretched in the past. 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 160/100, 82, 100% on RA, 97.7 T

## 2019-05-12 NOTE — Telephone Encounter (Signed)
Pt called and stated that she had just left the hospital this morning.  Pt complained about dysphagia--just had virtual visit with Dr. Silverio Decamp 05/05/19.  She would like to schedule an EGD.  Do we need another virtual visit?

## 2019-05-12 NOTE — Discharge Instructions (Addendum)
Keep your morning appointment with your doctor and discuss further treatment for your throat condition.

## 2019-05-12 NOTE — Telephone Encounter (Signed)
Thanks for letting me know. I would schedule her for a follow up visit with me. Thanks

## 2019-05-25 NOTE — Progress Notes (Signed)
Prescreened pt for tomorrows appt.

## 2019-05-26 ENCOUNTER — Ambulatory Visit (INDEPENDENT_AMBULATORY_CARE_PROVIDER_SITE_OTHER): Payer: Medicaid Other | Admitting: Gastroenterology

## 2019-05-26 ENCOUNTER — Other Ambulatory Visit: Payer: Self-pay

## 2019-05-26 ENCOUNTER — Encounter: Payer: Self-pay | Admitting: Gastroenterology

## 2019-05-26 VITALS — Ht 69.0 in | Wt 202.0 lb

## 2019-05-26 DIAGNOSIS — K5909 Other constipation: Secondary | ICD-10-CM | POA: Diagnosis not present

## 2019-05-26 DIAGNOSIS — K219 Gastro-esophageal reflux disease without esophagitis: Secondary | ICD-10-CM

## 2019-05-26 DIAGNOSIS — R1310 Dysphagia, unspecified: Secondary | ICD-10-CM

## 2019-05-26 DIAGNOSIS — Z860100 Personal history of colon polyps, unspecified: Secondary | ICD-10-CM

## 2019-05-26 MED ORDER — SUPREP BOWEL PREP KIT 17.5-3.13-1.6 GM/177ML PO SOLN: ORAL | 0 refills | Status: DC

## 2019-05-26 MED ORDER — SUCRALFATE 1 GM/10ML PO SUSP: 1.0000 g | Freq: Four times a day (QID) | ORAL | 1 refills | Status: DC | PRN

## 2019-05-26 MED ORDER — PANTOPRAZOLE SODIUM 40 MG PO PACK: 40.0000 mg | PACK | Freq: Every day | ORAL | 5 refills | Status: DC

## 2019-05-26 NOTE — Patient Instructions (Signed)
If you are age 39 or older, your body mass index should be between 23-30. Your Body mass index is 29.83 kg/m. If this is out of the aforementioned range listed, please consider follow up with your Primary Care Provider.  If you are age 107 or younger, your body mass index should be between 19-25. Your Body mass index is 29.83 kg/m. If this is out of the aformentioned range listed, please consider follow up with your Primary Care Provider.   To help prevent the possible spread of infection to our patients, communities, and staff; we will be implementing the following measures:  As of now we are not allowing any visitors/family members to accompany you to any upcoming appointments with [facility]. If you have any concerns about this please contact our office to discuss prior to the appointment.   We have sent the following medications to your pharmacy for you to pick up at your convenience: Protonix 40mg  liquid: Take 79ml once daily  Carafate suspension: Take 44ml every 6 hours as needed  You have been scheduled for an endoscopy and colonoscopy. Please follow the written instructions given to you at your visit today. Please pick up your prep supplies at the pharmacy within the next 1-3 days. If you use inhalers (even only as needed), please bring them with you on the day of your procedure. Your physician has requested that you go to www.startemmi.com and enter the access code given to you at your visit today. This web site gives a general overview about your procedure. However, you should still follow specific instructions given to you by our office regarding your preparation for the procedure.  Thank you for entrusting me with your care and for choosing [facility], Dr. Aquia Harbour Cellar

## 2019-05-26 NOTE — Progress Notes (Signed)
Virtual Visit via Video Note  I connected with Raven [name] on 05/26/19 at 10:30 AM EDT by a video enabled telemedicine application and verified that I am speaking with the correct person using two identifiers.  I discussed the limitations of evaluation and management by telemedicine and the availability of in person appointments. The patient expressed understanding and agreed to proceed.  THIS ENCOUNTER IS A VIRTUAL VISIT DUE TO COVID-19 - PATIENT WAS NOT SEEN IN THE OFFICE. PATIENT HAS CONSENTED TO VIRTUAL VISIT / TELEMEDICINE VISIT USING [employer]ITY APP   Location of patient: home Location of provider: office Persons participating: myself, patient   HPI :  38 y/o female here for a follow up visit. She has a history  of dysphagia, reflux, chronic abdominal pain, vocal cord dysfunction, chronic constipation, chronic narcotic use.  Please see my intake note from 09/03/2018 for full details of her case. She has had EGDs in the past, barium swallow, esophageal manometry testing, CT imaging of the neck, in the past for symptoms. 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  She has had ongoing dysphagia, she feels it in her throat and in her chest. Extensive evaluation as mentioned. Most recently she had another esophageal manometry in Feb 2020 and a barium swallow on 01/27/19 which were both normal. Thought to be oropharyngeal perhaps. She is adamant she has had benefit from her last dilation which was done empirically in 2009 to 64mm, although she can't remember how long it lasted. She reports odynophagia for the past 2 months which has been new for her. It comes and goes but she has been eating less due to symptoms. She has discomfort with each swallow that goes from her throat down her entire chest. She also has ongoing pyrosis with water brash bothering her. She previously was using prevacid solutabs but stopped  at some point. She cannot tolerate pills very well due to her symptoms. She is using gaviscon gummies which do help her symptoms. She also has some regurgitation and gagging when sleeping which bothers her, sleeping with her head of bed elevated. Her last EGD in 2015 was normal.   Otherwise since her last visit with me she saw Dr. Silverio Decamp for an acute visit. She was placed on Linzes 242mcg once daily which has helped with cramping and constipation. She is having multiple BMs per day which is helping to reduce her pain / cramping. She is also using Bentyl once daily - she is using at least once daily. She thinks it has been helping, reduces her cramping in her abdomen.  She has completely stopped smoking cigarettes.   She otherwise is requesting a surveillance colonoscopy. She had an 73mm ascending colon adenoma removed in 2015 at the age of 23.   Manometry done again Feb 2020 - normal Barium swallow 01/27/19 - reflux noted, otherwise normal exam  Modified barium swallow 01/26/2018 - normal CT neck 12/10/2017 - normal  Esophageal manometry 02/23/15 - normal, no evidence of dysmotility Barium swallow 10/04/2014 - incomplete clearance of contrast in upper esophagus, absent secondary peristalsis, concern for dysmotility  Colonoscopy 05/10/2014 - 45mm ascending colon polyp, 70mm ascending colon polyp, diverticulosis sigmoid colon - one polyp TA, one polyp HP EGD 04/13/2014 - normal  Barium study 2010 normal   Past Medical History:  Diagnosis Date  . Abnormal Pap smear   . Achalasia    esphog stretched several times  .  Anxiety   . Arthritis   . Asthma    rarely uses inhaler  . Blood transfusion 01/2006   [place]  . Cardiomyopathy ([place])   . Constipation due to opioid therapy   . Depression   . Dysrhythmia   . Fibromyalgia   . GERD (gastroesophageal reflux disease)    no meds - diet controlled  . Heart murmur   . Hiatal hernia   . History of abnormal Pap smear   . History of chlamydia 1990's   . HSV-2 infection   . HTN (hypertension)   . Occipital neuralgia of right side 04/21/2017  . Pneumonia 05/2013  . PVC's (premature ventricular contractions)   . Shortness of breath dyspnea   . Status post dilation of esophageal narrowing   . Vaginal Pap smear, abnormal   . Vocal cord paralysis      Past Surgical History:  Procedure Laterality Date  . ADENOIDECTOMY  12/2002  . BLADDER SUSPENSION  06/11/2012   Procedure: TRANSVAGINAL TAPE (TVT) PROCEDURE;  Surgeon: Delice Lesch, MD;  Location: [facility];  Service: Gynecology;  Laterality: N/A;  Tension Free Vaginal Tape/cystoscopy  . BREAST SURGERY  07/2001   tumor removed right breast  . CHOLECYSTECTOMY N/A 01/23/2015   Procedure: LAPAROSCOPIC CHOLECYSTECTOMY ;  Surgeon: Fanny Skates, MD;  Location: [facility];  Service: General;  Laterality: N/A;  case booked with cholangiogram but MD did not complete intraoperative cholangiogram   . CYSTOSCOPY  06/11/2012   Procedure: CYSTOSCOPY;  Surgeon: Delice Lesch, MD;  Location: [facility];  Service: Gynecology;  Laterality: N/A;  . DILATION AND CURETTAGE OF UTERUS  1999  . DILATION AND CURETTAGE OF UTERUS  01/2006   retained products  . ESOPHAGEAL MANOMETRY N/A 02/23/2019   Procedure: ESOPHAGEAL MANOMETRY (EM);  Surgeon: Mauri Pole, MD;  Location: [facility];  Service: Endoscopy;  Laterality: N/A;  . ESOPHAGOGASTRODUODENOSCOPY     with esophageal dilation x 3  . FOREIGN BODY REMOVAL  06/11/2012   Procedure: REMOVAL FOREIGN BODY EXTREMITY;  Surgeon: Delice Lesch, MD;  Location: [facility];  Service: Gynecology;  Laterality: Right;  Removal of Implanon   . LAPAROSCOPIC TUBAL LIGATION  06/11/2012   Procedure: LAPAROSCOPIC TUBAL LIGATION;  Surgeon: Delice Lesch, MD;  Location: [facility];  Service: Gynecology;  Laterality: Bilateral;  Laparoscopic Bilateral Fulgeration  . TONSILLECTOMY    . TUBAL LIGATION     Family History  Problem Relation Age of Onset  . Heart disease Father   .  Hypertension Father   . Stroke Mother   . Hypertension Mother   . Heart attack Mother   . Breast cancer Mother   . Neuropathy Mother   . Arthritis Mother   . Asthma Mother   . Peripheral Artery Disease Mother 79       had her leg amputated 2020  . Neuropathy Sister   . Arthritis Sister   . Asthma Sister   . Colon cancer Neg Hx   . Esophageal cancer Neg Hx   . Stomach cancer Neg Hx    Social History   Tobacco Use  . Smoking status: Former Smoker    Packs/day: 0.25    Years: 15.00    Pack years: 3.75    Types: Cigarettes    Last attempt to quit: 04/29/2019    Years since quitting: 0.0  . Smokeless tobacco: Never Used  . Tobacco comment: stopped smoking 04-29-2019  Substance Use Topics  . Alcohol use: No  Comment: on occasion  . Drug use: No   Current Outpatient Medications  Medication Sig Dispense Refill  . clonazePAM (KLONOPIN) 2 MG tablet Take 1 tablet (2 mg total) by mouth every 8 (eight) hours. 12 tablet 0  . dicyclomine (BENTYL) 10 MG capsule Take 1-2 every 8 hours as needed (Patient taking differently: Take 10 mg by mouth 3 (three) times daily as needed for spasms. Take 1-2 every 8 hours as needed) 60 capsule 3  . FLUoxetine (PROZAC) 20 MG/5ML solution Take 10 mg by mouth daily.   3  . hydrOXYzine (ATARAX/VISTARIL) 50 MG tablet Take 50 mg by mouth every 6 (six) hours as needed for anxiety.   0  . lansoprazole (PREVACID SOLUTAB) 30 MG disintegrating tablet Take 1 tablet (30 mg total) by mouth daily. 90 tablet 3  . LATUDA 60 MG TABS Take 1 tablet by mouth daily.   0  . lidocaine (XYLOCAINE) 2 % solution Use as directed 15 mLs in the mouth or throat every 4 (four) hours as needed for mouth pain. Swish and spit 100 mL 0  . linaclotide (LINZESS) 290 MCG CAPS capsule Take 1 capsule (290 mcg total) by mouth daily before breakfast. 90 capsule 3  . oxyCODONE-acetaminophen (PERCOCET) 10-325 MG tablet Take 1 tablet by mouth 4 (four) times daily.   0  . traZODone (DESYREL) 50 MG  tablet Take 50 mg by mouth at bedtime.   0   No current facility-administered medications for this visit.    Allergies  Allergen Reactions  . Dust Mite Extract Shortness Of Breath  . Other Shortness Of Breath    Feathers, trees "almost everything outside"  . Pollen Extract Shortness Of Breath     Review of Systems: All systems reviewed and negative except where noted in HPI.   Lab Results  Component Value Date   WBC 9.0 04/29/2019   HGB 12.8 04/29/2019   HCT 37.0 04/29/2019   MCV 88.7 04/29/2019   PLT 202 04/29/2019    Lab Results  Component Value Date   CREATININE 0.88 04/29/2019   BUN 6 04/29/2019   NA 141 04/29/2019   K 3.2 (L) 04/29/2019   CL 108 04/29/2019   CO2 21 (L) 04/29/2019   Lab Results  Component Value Date   ALT 24 04/29/2019   AST 23 04/29/2019   ALKPHOS 61 04/29/2019   BILITOT 0.5 04/29/2019       Dg Chest 2 View  Result Date: 04/28/2019 CLINICAL DATA:  38 year old female with shortness of breath. Vocal cord paralysis. Smoker. EXAM: CHEST - 2 VIEW COMPARISON:  02/13/2019 chest radiographs and earlier. FINDINGS: PA and lateral views of the chest. Mildly lower lung volumes. Normal cardiac size and mediastinal contours. Visualized tracheal air column is within normal limits. Mild bilateral pulmonary interstitial markings are chronic to a degree but appear increased in both lungs. No pneumothorax, pleural effusion or confluent pulmonary opacity. Stable cholecystectomy clips. Negative visible bowel gas pattern. No osseous abnormality identified. IMPRESSION: Acute on chronic increased pulmonary interstitial markings. Consider viral/atypical respiratory infection. Electronically Signed   By: Genevie Ann M.D.   On: 04/28/2019 00:29   Dg Chest Portable 1 View  Result Date: 04/29/2019 CLINICAL DATA:  37 year old female with productive cough. Chest congestion. Smoker. EXAM: PORTABLE CHEST 1 VIEW COMPARISON:  Chest radiographs 04/27/2019 and earlier. FINDINGS:  Portable AP semi upright view at 0253 hours. Lung volumes and mediastinal contours remain normal. Mild chronic pulmonary interstitial markings are stable since 2018. Otherwise when allowing  for portable technique the lungs are clear. Negative visible bowel gas pattern. No osseous abnormality identified. IMPRESSION: No acute cardiopulmonary abnormality. Electronically Signed   By: Genevie Ann M.D.   On: 04/29/2019 03:26    Physical Exam: Ht 5\' 9"  (1.753 m)   Wt 202 lb (91.6 kg)   BMI 29.83 kg/m     ASSESSMENT AND PLAN: 38 y/o female here for reassessment of the following:  GERD / Dysphagia / Odynophagia - longstanding dysphagia which may be orophayngeal, she has vocal cord dysfunction which is driving a lot of her symptoms. However, she has worsening pyrosis / regurgitation now as well as odynophagia which is relatively new for her. Intolerant of capsules / tablets, using prevacid solutab but stopped. Discussed options, recommend liquid protonix to see if she would tolerate that better, 40mg  / day, and liquid carafate which she can use PRN. She is asking for endoscopy with empiric dilation as that has helped her symptoms in the past and it has been sometime since she has had this. I offered it to her given her odynophagia which is new, assess for esophagitis, ensure no candidiasis, and can perform empiric dilation. I discussed risks / benefits and she wanted to proceed. Further recs pending results and her course.   Chronic constipation / narcotic use - continue Linzess, bentyl  History of colon adenoma - 30mm adenoma removed at age 38. At the time it was recommended she have a colonoscopy in 2020, 5 years from her last exam, based on guidelines at that time. Now, updated current guidelines would recommend a 7 year follow up for that type of polyp, however the guidelines do not address surveillance of polyps in patients at a younger age group. Given her younger age at first adenoma, I offered her a  surveillance colonoscopy now, and she strongly wished to proceed. This will be done at same time as EGD.   Dunellen Cellar, MD [facility]

## 2019-05-27 ENCOUNTER — Telehealth: Payer: Self-pay

## 2019-05-27 NOTE — Telephone Encounter (Signed)
Spoke to pt to go through instructions for ECL on 06-08-19 with [place].  Pt indicated she will call me back on Monday to go through prep instructions that were sent to her MyChart. She hasn't had a chance to look at it yet.

## 2019-05-30 NOTE — Telephone Encounter (Signed)
Called and spoke to pt. She does not have a way to access MyChart right now so I am mailing her her instructions for her procedure on 6-10.  She agreed to call me as soon as she receives them so we can review them together and make sure all her questions are answered.

## 2019-06-06 ENCOUNTER — Telehealth: Payer: Self-pay | Admitting: *Deleted

## 2019-06-06 NOTE — Telephone Encounter (Signed)

## 2019-06-08 ENCOUNTER — Encounter: Payer: Self-pay | Admitting: Gastroenterology

## 2019-06-08 ENCOUNTER — Ambulatory Visit (AMBULATORY_SURGERY_CENTER): Payer: Medicaid Other | Admitting: Gastroenterology

## 2019-06-08 ENCOUNTER — Other Ambulatory Visit: Payer: Self-pay

## 2019-06-08 ENCOUNTER — Encounter: Payer: Medicaid Other | Admitting: Gastroenterology

## 2019-06-08 VITALS — BP 131/80 | HR 78 | Temp 97.8°F | Resp 24 | Ht 69.0 in | Wt 202.0 lb

## 2019-06-08 DIAGNOSIS — R1310 Dysphagia, unspecified: Secondary | ICD-10-CM | POA: Diagnosis not present

## 2019-06-08 DIAGNOSIS — K219 Gastro-esophageal reflux disease without esophagitis: Secondary | ICD-10-CM | POA: Diagnosis not present

## 2019-06-08 DIAGNOSIS — Z86010 Personal history of colonic polyps: Secondary | ICD-10-CM

## 2019-06-08 DIAGNOSIS — D120 Benign neoplasm of cecum: Secondary | ICD-10-CM

## 2019-06-08 MED ORDER — SODIUM CHLORIDE 0.9 % IV SOLN: 500.0000 mL | Freq: Once | INTRAVENOUS | Status: DC

## 2019-06-08 NOTE — Progress Notes (Signed)
Called to room to assist during endoscopic procedure.  Patient ID and intended procedure confirmed with present staff. Received instructions for my participation in the procedure from the performing physician.  

## 2019-06-08 NOTE — Patient Instructions (Signed)
Discharge instructions given. Handouts on polyps,diverticulosis,hemorrhoids and a dilatation diet. Resume previous medications. YOU HAD AN ENDOSCOPIC PROCEDURE TODAY AT [facility]:   Refer to the procedure report that was given to you for any specific questions about what was found during the examination.  If the procedure report does not answer your questions, please call your gastroenterologist to clarify.  If you requested that your care partner not be given the details of your procedure findings, then the procedure report has been included in a sealed envelope for you to review at your convenience later.  YOU SHOULD EXPECT: Some feelings of bloating in the abdomen. Passage of more gas than usual.  Walking can help get rid of the air that was put into your GI tract during the procedure and reduce the bloating. If you had a lower endoscopy (such as a colonoscopy or flexible sigmoidoscopy) you may notice spotting of blood in your stool or on the toilet paper. If you underwent a bowel prep for your procedure, you may not have a normal bowel movement for a few days.  Please Note:  You might notice some irritation and congestion in your nose or some drainage.  This is from the oxygen used during your procedure.  There is no need for concern and it should clear up in a day or so.  SYMPTOMS TO REPORT IMMEDIATELY:   Following lower endoscopy (colonoscopy or flexible sigmoidoscopy):  Excessive amounts of blood in the stool  Significant tenderness or worsening of abdominal pains  Swelling of the abdomen that is new, acute  Fever of 100F or higher   Following upper endoscopy (EGD)  Vomiting of blood or coffee ground material  New chest pain or pain under the shoulder blades  Painful or persistently difficult swallowing  New shortness of breath  Fever of 100F or higher  Black, tarry-looking stools  For urgent or emergent issues, a gastroenterologist can be reached at any hour by  calling [phone].   DIET:  We do recommend a small meal at first, but then you may proceed to your regular diet.  Drink plenty of fluids but you should avoid alcoholic beverages for 24 hours.  ACTIVITY:  You should plan to take it easy for the rest of today and you should NOT DRIVE or use heavy machinery until tomorrow (because of the sedation medicines used during the test).    FOLLOW UP: Our staff will call the number listed on your records 48-72 hours following your procedure to check on you and address any questions or concerns that you may have regarding the information given to you following your procedure. If we do not reach you, we will leave a message.  We will attempt to reach you two times.  During this call, we will ask if you have developed any symptoms of COVID 19. If you develop any symptoms (ie: fever, flu-like symptoms, shortness of breath, cough etc.) before then, please call [phone].  If you test positive for Covid 19 in the 2 weeks post procedure, please call and report this information to [place].    If any biopsies were taken you will be contacted by phone or by letter within the next 1-3 weeks.  Please call us at [phone] if you have not heard about the biopsies in 3 weeks.    SIGNATURES/CONFIDENTIALITY: You and/or your care partner have signed paperwork which will be entered into your electronic medical record.  These signatures attest to the fact that that  the information above on your After Visit Summary has been reviewed and is understood.  Full responsibility of the confidentiality of this discharge information lies with you and/or your care-partner.

## 2019-06-08 NOTE — Progress Notes (Signed)
To PACU, VSS. Report to [email or web address] 

## 2019-06-08 NOTE — Progress Notes (Signed)
Courtney Washington took temp and Judy Branson took vitals. 

## 2019-06-08 NOTE — Op Note (Signed)
[facility] Patient Name: Raven [name] Procedure Date: 06/08/2019 4:52 PM MRN: [id number] Endoscopist: Remo Lipps P. Havery Moros , MD Age: 38 Referring MD:  Date of Birth: Sep 28, 1981 Gender: Female Account #: [id number] Procedure:                Upper GI endoscopy Indications:              Dysphagia, Follow-up of gastro-esophageal reflux                            disease, Globus sensation Medicines:                Monitored Anesthesia Care Procedure:                Pre-Anesthesia Assessment:                           - Prior to the procedure, a History and Physical                            was performed, and patient medications and                            allergies were reviewed. The patient's tolerance of                            previous anesthesia was also reviewed. The risks                            and benefits of the procedure and the sedation                            options and risks were discussed with the patient.                            All questions were answered, and informed consent                            was obtained. Prior Anticoagulants: The patient has                            taken no previous anticoagulant or antiplatelet                            agents. ASA Grade Assessment: II - A patient with                            mild systemic disease. After reviewing the risks                            and benefits, the patient was deemed in                            satisfactory condition to undergo the procedure.  After obtaining informed consent, the endoscope was                            passed under direct vision. Throughout the                            procedure, the patient's blood pressure, pulse, and                            oxygen saturations were monitored continuously. The                            Model GIF-[id number]) scope was introduced                            through the mouth, and  advanced to the second part                            of duodenum. The upper GI endoscopy was                            accomplished without difficulty. The patient                            tolerated the procedure well. Scope In: Scope Out: Findings:                 Esophagogastric landmarks were identified: the                            Z-line was found at 38 cm, the gastroesophageal                            junction was found at 38 cm and the upper extent of                            the gastric folds was found at 38 cm from the                            incisors.                           The exam of the esophagus was otherwise normal.                           A guidewire was placed and the scope was withdrawn.                            Empiric dilation was performed in the entire                            esophagus with a Savary dilator with mild  resistance at 17 mm and 18 mm. Relook endoscopy                            showed an appropriate mucosal wrent at the proximal                            esophagus / UES, suspect subtle stenosis there was                            causing dysphagia. Biopsies were taken with a cold                            forceps in the upper third of the esophagus, in the                            middle third of the esophagus and in the lower                            third of the esophagus for histology, rule out EoE.                           The entire examined stomach was normal.                           The duodenal bulb and second portion of the                            duodenum were normal. Complications:            No immediate complications. Estimated blood loss:                            Minimal. Estimated Blood Loss:     Estimated blood loss was minimal. Impression:               - Esophagogastric landmarks identified.                           - Empiric dilation performed to 9mm with                             appropriate mucosal wrent noted at the UES, suspect                            mild stenosis there causing symptoms                           - Biopsies taken of the esophagus to rule out EoE.                           - Normal stomach.                           - Normal duodenal bulb and second portion of the  duodenum. Recommendation:           - Patient has a contact number available for                            emergencies. The signs and symptoms of potential                            delayed complications were discussed with the                            patient. Return to normal activities tomorrow.                            Written discharge instructions were provided to the                            patient.                           - Post-dilation diet                           - Continue present medications.                           - Await pathology results. Remo Lipps P. Karinne Schmader, MD 06/08/2019 5:46:29 PM This report has been signed electronically.

## 2019-06-08 NOTE — Op Note (Signed)
[facility] Patient Name: Raven [name] Procedure Date: 06/08/2019 4:52 PM MRN: [id number] Endoscopist: Remo Lipps P. Havery Moros , MD Age: 38 Referring MD:  Date of Birth: September 07, 1981 Gender: Female Account #: [id number] Procedure:                Colonoscopy Indications:              Surveillance: Personal history of adenomatous                            polyps on last colonoscopy 5 years ago Medicines:                Monitored Anesthesia Care Procedure:                Pre-Anesthesia Assessment:                           - Prior to the procedure, a History and Physical                            was performed, and patient medications and                            allergies were reviewed. The patient's tolerance of                            previous anesthesia was also reviewed. The risks                            and benefits of the procedure and the sedation                            options and risks were discussed with the patient.                            All questions were answered, and informed consent                            was obtained. Prior Anticoagulants: The patient has                            taken no previous anticoagulant or antiplatelet                            agents. ASA Grade Assessment: II - A patient with                            mild systemic disease. After reviewing the risks                            and benefits, the patient was deemed in                            satisfactory condition to undergo the procedure.  After obtaining informed consent, the colonoscope                            was passed under direct vision. Throughout the                            procedure, the patient's blood pressure, pulse, and                            oxygen saturations were monitored continuously. The                            Colonoscope was introduced through the anus and                            advanced to the the  cecum, identified by                            appendiceal orifice and ileocecal valve. The                            colonoscopy was performed without difficulty. The                            patient tolerated the procedure well. The quality                            of the bowel preparation was adequate. The                            ileocecal valve, appendiceal orifice, and rectum                            were photographed. Scope In: 5:14:02 PM Scope Out: 5:36:11 PM Scope Withdrawal Time: 0 hours 17 minutes 35 seconds  Total Procedure Duration: 0 hours 22 minutes 9 seconds  Findings:                 Hemorrhoids were found on perianal exam.                           A 3 mm polyp was found in the cecum. The polyp was                            sessile. The polyp was removed with a cold snare.                            Resection and retrieval were complete.                           Multiple small-mouthed diverticula were found in                            the ascending colon and left colon.  Internal hemorrhoids were found during retroflexion.                           The exam was otherwise without abnormality. Complications:            No immediate complications. Estimated blood loss:                            Minimal. Estimated Blood Loss:     Estimated blood loss was minimal. Impression:               - Hemorrhoids found on perianal exam.                           - One 3 mm polyp in the cecum, removed with a cold                            snare. Resected and retrieved.                           - Diverticulosis in the ascending colon and in the                            left colon.                           - Internal hemorrhoids.                           - The examination was otherwise normal. Recommendation:           - Patient has a contact number available for                            emergencies. The signs and symptoms of potential                             delayed complications were discussed with the                            patient. Return to normal activities tomorrow.                            Written discharge instructions were provided to the                            patient.                           - Resume previous diet.                           - Continue present medications.                           - Await pathology results. Remo Lipps P. Armbruster, MD 06/08/2019 5:42:02 PM This report has been signed electronically.

## 2019-06-08 NOTE — Progress Notes (Signed)
Pt's states no medical or surgical changes since previsit or office visit. 

## 2019-06-10 ENCOUNTER — Telehealth: Payer: Self-pay | Admitting: *Deleted

## 2019-06-10 NOTE — Telephone Encounter (Signed)
I called the patient. She had a dilation yesterday with a mucosal wrent at the [facility]. She states her swallowing is much better since the dilation. She has soreness after the procedure which I explained can be expected. It is only when she swallows, otherwise doing fine. She has some internal hemorrhoids which are irritated from the procedure, recommend 1% topical hydorcortisone cream for that. Counseled her that I expect her throat to feel better slowly in the upcoming days, can use throat lozenges / hard candies to sooth it for now. She agreed, all questions answered.

## 2019-06-10 NOTE — Telephone Encounter (Signed)
  Follow up Call-  Call back number 06/08/2019  Post procedure Call Back phone  # [phone]  Permission to leave phone message Yes  Some recent data might be hidden     Patient questions:  Do you have a fever, pain , or abdominal swelling? Yes.   Pain Score  8 *  Have you tolerated food without any problems? Yes.    Have you been able to return to your normal activities? Yes.    Do you have any questions about your discharge instructions: Diet   No. Medications  Yes.   Follow up visit  No.  Do you have questions or concerns about your Care? No.  Actions: * If pain score is 4 or above: Physician/ provider Notified : Carlota Raspberry. Havery Moros, MD   1. Have you developed a fever since your procedure? no  2.   Have you had an respiratory symptoms (SOB or cough) since your procedure? no  3.   Have you tested positive for COVID 19 since your procedure no  4.   Have you had any family members/close contacts diagnosed with the COVID 19 since your procedure?  no   If yes to any of these questions please route to Joylene John, RN and Alphonsa Gin, Therapist, sports.    .Dr. Havery Moros,  Pt states she has a "lump in my throat on the left side", rating pain as an "8."  She states this pain is different from how she was feeling before the procedure.  She is having difficulty swallowing.  She was able to eat.  She also would like to know want to use for her hemorrhoids  Thanks, Cyril Mourning

## 2019-07-10 ENCOUNTER — Encounter (HOSPITAL_COMMUNITY): Payer: Self-pay | Admitting: Emergency Medicine

## 2019-07-10 ENCOUNTER — Emergency Department (HOSPITAL_COMMUNITY)
Admission: EM | Admit: 2019-07-10 | Discharge: 2019-07-10 | Disposition: A | Discharge status: Home / Self Care | Payer: Medicaid Other | Attending: Emergency Medicine | Admitting: Emergency Medicine

## 2019-07-10 ENCOUNTER — Emergency Department (HOSPITAL_COMMUNITY): Payer: Medicaid Other

## 2019-07-10 ENCOUNTER — Other Ambulatory Visit: Payer: Self-pay

## 2019-07-10 DIAGNOSIS — Z79899 Other long term (current) drug therapy: Secondary | ICD-10-CM | POA: Diagnosis not present

## 2019-07-10 DIAGNOSIS — Z87891 Personal history of nicotine dependence: Secondary | ICD-10-CM | POA: Diagnosis not present

## 2019-07-10 DIAGNOSIS — Y929 Unspecified place or not applicable: Secondary | ICD-10-CM | POA: Insufficient documentation

## 2019-07-10 DIAGNOSIS — Y999 Unspecified external cause status: Secondary | ICD-10-CM | POA: Insufficient documentation

## 2019-07-10 DIAGNOSIS — I10 Essential (primary) hypertension: Secondary | ICD-10-CM | POA: Insufficient documentation

## 2019-07-10 DIAGNOSIS — S4991XA Unspecified injury of right shoulder and upper arm, initial encounter: Secondary | ICD-10-CM | POA: Insufficient documentation

## 2019-07-10 DIAGNOSIS — Y9389 Activity, other specified: Secondary | ICD-10-CM | POA: Diagnosis not present

## 2019-07-10 DIAGNOSIS — J45909 Unspecified asthma, uncomplicated: Secondary | ICD-10-CM | POA: Diagnosis not present

## 2019-07-10 LAB — PREGNANCY, URINE: Preg Test, Ur: NEGATIVE

## 2019-07-10 MED ORDER — LIDOCAINE 5 % EX PTCH
1.0000 | MEDICATED_PATCH | CUTANEOUS | Status: DC
  Administered 2019-07-10: 1 via TRANSDERMAL
  Filled 2019-07-10: qty 1

## 2019-07-10 MED ORDER — NAPROXEN 375 MG PO TABS: 375.0000 mg | ORAL_TABLET | Freq: Two times a day (BID) | ORAL | 0 refills | Status: DC

## 2019-07-10 MED ORDER — ACETAMINOPHEN 500 MG PO TABS
1000.0000 mg | ORAL_TABLET | Freq: Once | ORAL | Status: AC
  Administered 2019-07-10: 1000 mg via ORAL
  Filled 2019-07-10: qty 2
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
## 2019-07-10 NOTE — Discharge Instructions (Signed)
It was my pleasure taking care of you today!   Call the orthopedist listed today or tomorrow to schedule a follow up appointment for recheck of ongoing shoulder pain in 1-2 weeks that can be canceled with a 24-48 hour notice if complete resolution of pain.    Call the orthopedist listed if symptoms are not improved in one week.   Return to the ER for new or worsening symptoms, any additional concerns.  COLD THERAPY DIRECTIONS:  Ice or gel packs can be used to reduce both pain and swelling. Ice is the most helpful within the first 24 to 48 hours after an injury or flareup from overusing a muscle or joint.  Ice is effective, has very few side effects, and is safe for most people to use.   If you expose your skin to cold temperatures for too long or without the proper protection, you can damage your skin or nerves. Watch for signs of skin damage due to cold.   HOME CARE INSTRUCTIONS  Follow these tips to use ice and cold packs safely.  Place a dry or damp towel between the ice and skin. A damp towel will cool the skin more quickly, so you may need to shorten the time that the ice is used.  For a more rapid response, add gentle compression to the ice.  Ice for no more than 10 to 20 minutes at a time. The bonier the area you are icing, the less time it will take to get the benefits of ice.  Check your skin after 5 minutes to make sure there are no signs of a poor response to cold or skin damage.  Rest 20 minutes or more in between uses.  Once your skin is numb, you can end your treatment. You can test numbness by very lightly touching your skin. The touch should be so light that you do not see the skin dimple from the pressure of your fingertip. When using ice, most people will feel these normal sensations in this order: cold, burning, aching, and numbness.

## 2019-07-10 NOTE — ED Notes (Signed)
Patient transported to X-ray 
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
## 2019-08-17 ENCOUNTER — Ambulatory Visit (INDEPENDENT_AMBULATORY_CARE_PROVIDER_SITE_OTHER): Payer: Medicaid Other

## 2019-08-17 ENCOUNTER — Ambulatory Visit (INDEPENDENT_AMBULATORY_CARE_PROVIDER_SITE_OTHER): Payer: Medicaid Other | Admitting: Orthopaedic Surgery

## 2019-08-17 ENCOUNTER — Encounter: Payer: Self-pay | Admitting: Orthopaedic Surgery

## 2019-08-17 VITALS — Ht 69.0 in | Wt 194.0 lb

## 2019-08-17 DIAGNOSIS — M542 Cervicalgia: Secondary | ICD-10-CM

## 2019-08-17 DIAGNOSIS — S29011A Strain of muscle and tendon of front wall of thorax, initial encounter: Secondary | ICD-10-CM

## 2019-08-17 MED ORDER — DICLOFENAC POTASSIUM 50 MG PO TABS: 50.0000 mg | ORAL_TABLET | Freq: Two times a day (BID) | ORAL | 0 refills | Status: DC | PRN
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
## 2019-08-19 ENCOUNTER — Telehealth: Payer: Self-pay

## 2019-08-19 ENCOUNTER — Other Ambulatory Visit: Payer: Self-pay

## 2019-08-19 NOTE — Telephone Encounter (Signed)
I got a prior auth request for "Diclofeac Potassium" Was that correct? Or did you mean the "Diclofenac Sodium"?

## 2019-08-22 ENCOUNTER — Other Ambulatory Visit: Payer: Self-pay

## 2019-08-22 MED ORDER — DICLOFENAC SODIUM 50 MG PO TBEC: 50.0000 mg | DELAYED_RELEASE_TABLET | Freq: Two times a day (BID) | ORAL | 1 refills | Status: DC

## 2019-08-22 NOTE — Telephone Encounter (Signed)
change

## 2019-08-22 NOTE — Telephone Encounter (Signed)
Yes,willyou change

## 2019-08-23 ENCOUNTER — Ambulatory Visit: Payer: Medicaid Other | Attending: Orthopaedic Surgery | Admitting: Physical Therapy

## 2019-09-20 ENCOUNTER — Ambulatory Visit (INDEPENDENT_AMBULATORY_CARE_PROVIDER_SITE_OTHER): Payer: Medicaid Other | Admitting: Physician Assistant

## 2019-09-20 ENCOUNTER — Encounter: Payer: Self-pay | Admitting: Physician Assistant

## 2019-09-20 ENCOUNTER — Encounter

## 2019-09-20 VITALS — BP 120/78 | HR 100 | Temp 97.4°F | Ht 69.0 in | Wt 201.1 lb

## 2019-09-20 DIAGNOSIS — K224 Dyskinesia of esophagus: Secondary | ICD-10-CM | POA: Diagnosis not present

## 2019-09-20 DIAGNOSIS — R1310 Dysphagia, unspecified: Secondary | ICD-10-CM | POA: Diagnosis not present

## 2019-09-20 MED ORDER — LINACLOTIDE 290 MCG PO CAPS: 290.0000 ug | ORAL_CAPSULE | Freq: Every day | ORAL | 3 refills | Status: DC

## 2019-09-20 MED ORDER — PANTOPRAZOLE SODIUM 40 MG PO PACK: 40.0000 mg | PACK | Freq: Every day | ORAL | 11 refills | Status: DC

## 2019-09-20 NOTE — Patient Instructions (Addendum)
If you are age 39 or older, your body mass index should be between 23-30. Your Body mass index is 29.7 kg/m. If this is out of the aforementioned range listed, please consider follow up with your Primary Care Provider.  If you are age 71 or younger, your body mass index should be between 19-25. Your Body mass index is 29.7 kg/m. If this is out of the aformentioned range listed, please consider follow up with your Primary Care Provider.   You have been scheduled for an endoscopy. Please follow written instructions given to you at your visit today. If you use inhalers (even only as needed), please bring them with you on the day of your procedure.  Continue Nexium and Linzess   We have scheduled you a follow up for ENT ( Dr. May ) 10-03-2019   [address], [place]  [phone]   It was a pleasure to see you today!

## 2019-09-20 NOTE — Progress Notes (Signed)
7

## 2019-09-20 NOTE — Progress Notes (Signed)
Subjective:    Patient ID: Raven [name], female    DOB: 06-21-1981, 38 y.o.   MRN: [id number]  HPI Raven [name] is a pleasant 38 year old African-American female known to Dr. Havery Moros with history of a cardiomyopathy, chronic GERD and cholelithiasis.  She has history of dysphasia, vocal cord dysfunction, chronic constipation in setting of chronic narcotic use. She was last evaluated in May 2020 with virtual visit with recurrent complaints of dysphasia.  It is felt that her dysphasia may be oral pharyngeal and felt primarily due to vocal cord dysfunction however she had also complained at that time of worsening heartburn and regurgitation.  She has been intolerant to tablet form of PPIs and has been using powder. She is also on Linzess 290 mcg p.o. daily and says this works great. She underwent EGD in June 2020 as well as colonoscopy.  The esophagus appeared normal and she was empirically dilated to 18 mm.  She had biopsies taken to rule out eosinophilic esophagitis and these were negative. [facility]ik colonoscopy she had moderate a sending and left colon diverticulosis, 1 3 mm cecal polyp which was an adenoma and internal hemorrhoids. She has had prior work-up with manometry which was normal, and barium swallow most recently done in January 2020 showed a normal primary peristaltic wave there was spontaneous GERD to the level of the upper esophagus, no stricture or narrowing. She has had previous ENT evaluation through [facility] and was last seen there in January.  Per notes injection of the vocal cord had been considered and referral to speech pathology. Patient says her same symptoms are back, she did get definite benefit from the endoscopy but says now over the past month she has again increased mucus and hoarseness.  Also with sensation of food sitting in her esophagus.  Separately she is concerned about some "hard knots" that she can feel externally in her throat.  She says she can also feel  pockets on the side of her throat if she puts a finger in the back of her throat.  She is status post tonsillectomy.  She says she feels like her food sits in the back of her pharynx ever since she had had a prior tonsillectomy and is concerned about the "knot" sensation.  Review of Systems Pertinent positive and negative review of systems were noted in the above HPI section.  All other review of systems was otherwise negative.  Outpatient Encounter Medications as of 09/20/2019  Medication Sig  . clonazePAM (KLONOPIN) 2 MG tablet Take 1 tablet (2 mg total) by mouth every 8 (eight) hours.  . diclofenac (CATAFLAM) 50 MG tablet Take 1 tablet (50 mg total) by mouth 2 (two) times daily as needed.  . diclofenac (VOLTAREN) 50 MG EC tablet Take 1 tablet (50 mg total) by mouth 2 (two) times daily.  Marland Kitchen dicyclomine (BENTYL) 10 MG capsule Take 1-2 every 8 hours as needed (Patient taking differently: Take 10 mg by mouth 3 (three) times daily as needed for spasms. Take 1-2 every 8 hours as needed)  . FLUoxetine (PROZAC) 20 MG/5ML solution Take 10 mg by mouth daily.   . hydrOXYzine (ATARAX/VISTARIL) 50 MG tablet Take 50 mg by mouth every 6 (six) hours as needed for anxiety.   Marland Kitchen LATUDA 60 MG TABS Take 1 tablet by mouth daily.   Marland Kitchen lidocaine (XYLOCAINE) 2 % solution Use as directed 15 mLs in the mouth or throat every 4 (four) hours as needed for mouth pain. Swish and spit  .  linaclotide (LINZESS) 290 MCG CAPS capsule Take 1 capsule (290 mcg total) by mouth daily before breakfast.  . naproxen (NAPROSYN) 375 MG tablet Take 1 tablet (375 mg total) by mouth 2 (two) times daily.  Marland Kitchen oxyCODONE-acetaminophen (PERCOCET) 10-325 MG tablet Take 1 tablet by mouth 4 (four) times daily.   . pantoprazole sodium (PROTONIX) 40 mg/20 mL PACK Take 20 mLs (40 mg total) by mouth daily.  . traZODone (DESYREL) 50 MG tablet Take 50 mg by mouth at bedtime.   . [DISCONTINUED] lansoprazole (PREVACID SOLUTAB) 30 MG disintegrating tablet Take 1  tablet (30 mg total) by mouth daily.  . [DISCONTINUED] linaclotide (LINZESS) 290 MCG CAPS capsule Take 1 capsule (290 mcg total) by mouth daily before breakfast.  . [DISCONTINUED] pantoprazole sodium (PROTONIX) 40 mg/20 mL PACK Take 20 mLs (40 mg total) by mouth daily.  . [DISCONTINUED] sucralfate (CARAFATE) 1 GM/10ML suspension Take 10 mLs (1 g total) by mouth every 6 (six) hours as needed.   No facility-administered encounter medications on file as of 09/20/2019.    Allergies  Allergen Reactions  . Dust Mite Extract Shortness Of Breath  . Other Shortness Of Breath    Feathers, trees "almost everything outside"  . Pollen Extract Shortness Of Breath   Patient Active Problem List   Diagnosis Date Noted  . Abnormal CXR 05/03/2019  . Globus sensation   . Chronic bilateral low back pain 09/07/2018  . Acute pain of both knees 09/07/2018  . 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 01/18/2018  . Chronically on opiate therapy 01/18/2018  . Excessive daytime sleepiness 01/18/2018  . Vivid dream 01/18/2018  . 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 Occipital neuralgia of right side 04/21/2017  . Fibromyalgia 04/21/2017  . UTI (urinary tract infection) 02/12/2017  . Gallstones and inflammation of gallbladder without obstruction 01/23/2015  . S/P laparoscopic surgery 01/23/2015  . Upper airway cough syndrome 09/05/2014  . Cigarette smoker 08/09/2014  . Dyspnea 08/08/2014  . Abdominal pain, left lower quadrant 03/27/2014  . Cardiomyopathy, idiopathic ([place]) 01/09/2014  . History of chlamydia 01/09/2013  . 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  . History of abnormal Pap smear 01/09/2013  . Achalasia   . Abnormal Pap smear   . Depression   . HSV-2 infection   . Heart palpitations   . Heart murmur   . Anxiety   . GERD (gastroesophageal reflux disease)   . 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  PVC's (premature ventricular contractions) 05/19/2011  . Palpitation 04/11/2011  . ESOPHAGEAL STRICTURE 08/10/2009  . Dysphagia 08/10/2009  . Esophageal reflux 11/30/2008  . Unspecified constipation 11/30/2008  . DEPRESSION 10/09/2008  . ASTHMA 10/09/2008   Social History   Socioeconomic History  . Marital status: Single    Spouse name: Not on file  . Number of children: 2  . Years of education: College  . Highest education level: Not on file  Occupational History  . Not on file  Social Needs  . Financial resource strain: Not on file  . Food insecurity    Worry: Not on file    Inability: Not on file  . Transportation needs    Medical: Not on file    Non-medical: Not on file  Tobacco Use  . Smoking status: Former Smoker    Packs/day: 0.25    Years: 15.00    Pack years: 3.75    Types: Cigarettes    Quit  date: 04/29/2019    Years since quitting: 0.3  . Smokeless tobacco: Never Used  . Tobacco comment: stopped smoking 04-29-2019  Substance and Sexual Activity  . Alcohol use: No    Comment: on occasion  . Drug use: No  . Sexual activity: Yes    Birth control/protection: Surgical  Lifestyle  . Physical activity    Days per week: Not on file    Minutes per session: Not on file  . Stress: Not on file  Relationships  . Social Herbalist on phone: Not on file    Gets together: Not on file    Attends religious service: Not on file    Active member of club or organization: Not on file    Attends meetings of clubs or organizations: Not on file    Relationship status: Not on file  . Intimate partner violence    Fear of current or ex partner: Not on file    Emotionally abused: Not on file    Physically abused: Not on file    Forced sexual activity: Not on file  Other Topics Concern  . Not on file  Social History Narrative   Lives with fiance and children   Caffeine use:  Hot ginger tea- daily   Left-handed    Ms. Terlecki's family history includes Arthritis in  her mother and sister; Asthma in her mother and sister; Breast cancer in her mother; Heart attack in her mother; Heart disease in her father; Hypertension in her father and mother; Neuropathy in her mother and sister; Peripheral Artery Disease (age of onset: 72) in her mother; Stomach cancer in her paternal uncle; Stroke in her mother.      Objective:    Vitals:   09/20/19 1145  BP: 120/78  Pulse: 100  Temp: (!) 97.4 F (36.3 C)    Physical Exam Well-developed well-nourished African-American female in no acute distress.  Height, Weight, 201 BMI 29.7  HEENT; nontraumatic normocephalic, EOMI, PER R LA, sclera anicteric. Oropharynx; oropharynx benign, uvula midline Neck; supple, no JVD, no palpable adenopathy cardiovascular; regular rate and rhythm with S1-S2, no murmur rub or gallop Pulmonary; Clear bilaterally Abdomen; soft, nontender, nondistended, no palpable mass or hepatosplenomegaly, bowel sounds are active Rectal; not done Skin; benign exam, no jaundice rash or appreciable lesions Extremities; no clubbing cyanosis or edema skin warm and dry Neuro/Psych; alert and oriented x4, grossly nonfocal mood and affect appropriate       Assessment & Plan:   #75 38 year old African-American female with chronic complaints of dysphasia which may be more pharyngeal in origin.  She had previous manometry which was normal, and barium swallow earlier this year also showed normal peristalsis and no stricture. She has undergone prior EGDs, most recently June 2020 with empiric dilation which does improve her symptoms temporarily.  She had improvement for a couple of months and is now complaining of recurrent increased hoarseness, mucus in her throat and a feeling of food "sitting in the esophagus  #2 history of vocal cord paresis, laryngoscopy January 2020 with finding of right vocal cord somewhat paretic, the remainder of the exam was unremarkable.  Recommended possible vocal cord injection, and  evaluation by speech-language pathology.  #3 history of adenomatous colon polyp-recent colonoscopy June 2020, will be indicated for 5-year interval follow-up  #4 anxiety  Plan; Swallowing issues and symptoms are likely multifactorial. Since she did obtain benefit from previous dilation will schedule for repeat EGD with dilation with Dr. Havery Moros. Continue  Protonix 40 mg p.o. daily. Continue Linzess 290 mcg daily We will also get patient referred back to ENT at [facility]/Dr. Magister for possible vocal cord injection.  And hopefully she can be set up for speech-language pathology evaluation there.      Genia Harold PA-C 09/20/2019   Cc: Lin Landsman, MD

## 2019-09-21 NOTE — Progress Notes (Signed)
Agree with assessment as outlined. This does seem more so oropharyngeal / functional in etiology at this point, agree she should follow up with ENT for further evaluation of this. If she found benefit with dilation we can try one more time but benefit seemed short lived.

## 2019-09-28 ENCOUNTER — Telehealth: Payer: Self-pay | Admitting: Gastroenterology

## 2019-09-28 ENCOUNTER — Ambulatory Visit: Payer: Medicaid Other | Admitting: Orthopaedic Surgery

## 2019-09-28 NOTE — Telephone Encounter (Signed)

## 2019-09-29 ENCOUNTER — Other Ambulatory Visit: Payer: Self-pay

## 2019-09-29 ENCOUNTER — Encounter: Payer: Self-pay | Admitting: Gastroenterology

## 2019-09-29 ENCOUNTER — Ambulatory Visit (AMBULATORY_SURGERY_CENTER): Payer: Medicaid Other | Admitting: Gastroenterology

## 2019-09-29 VITALS — BP 140/80 | HR 73 | Temp 97.7°F | Resp 19 | Ht 69.0 in | Wt 201.0 lb

## 2019-09-29 DIAGNOSIS — K222 Esophageal obstruction: Secondary | ICD-10-CM

## 2019-09-29 DIAGNOSIS — R0989 Other specified symptoms and signs involving the circulatory and respiratory systems: Secondary | ICD-10-CM

## 2019-09-29 DIAGNOSIS — R1310 Dysphagia, unspecified: Secondary | ICD-10-CM | POA: Diagnosis not present

## 2019-09-29 MED ORDER — SODIUM CHLORIDE 0.9 % IV SOLN: 500.0000 mL | Freq: Once | INTRAVENOUS | Status: DC

## 2019-09-29 NOTE — Progress Notes (Signed)
Temp taken by JB VS taken by Cw

## 2019-09-29 NOTE — Patient Instructions (Signed)
Post dilation diet - clear liquids for 2 hours - until 5:30pm today.  Soft diet the rest of today.  Resume refular diet tomorrow.  YOU HAD AN ENDOSCOPIC PROCEDURE TODAY AT [facility]:   Refer to the procedure report that was given to you for any specific questions about what was found during the examination.  If the procedure report does not answer your questions, please call your gastroenterologist to clarify.  If you requested that your care partner not be given the details of your procedure findings, then the procedure report has been included in a sealed envelope for you to review at your convenience later.  YOU SHOULD EXPECT: Some feelings of bloating in the abdomen. Passage of more gas than usual.  Walking can help get rid of the air that was put into your GI tract during the procedure and reduce the bloating. If you had a lower endoscopy (such as a colonoscopy or flexible sigmoidoscopy) you may notice spotting of blood in your stool or on the toilet paper. If you underwent a bowel prep for your procedure, you may not have a normal bowel movement for a few days.  Please Note:  You might notice some irritation and congestion in your nose or some drainage.  This is from the oxygen used during your procedure.  There is no need for concern and it should clear up in a day or so.  SYMPTOMS TO REPORT IMMEDIATELY:     Following upper endoscopy (EGD)  Vomiting of blood or coffee ground material  New chest pain or pain under the shoulder blades  Painful or persistently difficult swallowing  New shortness of breath  Fever of 100F or higher  Black, tarry-looking stools  For urgent or emergent issues, a gastroenterologist can be reached at any hour by calling [phone].   DIET:  We do recommend a small meal at first, but then you may proceed to your regular diet.  Drink plenty of fluids but you should avoid alcoholic beverages for 24 hours.  ACTIVITY:  You should plan to  take it easy for the rest of today and you should NOT DRIVE or use heavy machinery until tomorrow (because of the sedation medicines used during the test).    FOLLOW UP: Our staff will call the number listed on your records 48-72 hours following your procedure to check on you and address any questions or concerns that you may have regarding the information given to you following your procedure. If we do not reach you, we will leave a message.  We will attempt to reach you two times.  During this call, we will ask if you have developed any symptoms of COVID 19. If you develop any symptoms (ie: fever, flu-like symptoms, shortness of breath, cough etc.) before then, please call [phone].  If you test positive for Covid 19 in the 2 weeks post procedure, please call and report this information to [place].    If any biopsies were taken you will be contacted by phone or by letter within the next 1-3 weeks.  Please call us at [phone] if you have not heard about the biopsies in 3 weeks.    SIGNATURES/CONFIDENTIALITY: You and/or your care partner have signed paperwork which will be entered into your electronic medical record.  These signatures attest to the fact that that the information above on your After Visit Summary has been reviewed and is understood.  Full responsibility of the confidentiality of this discharge information lies  with you and/or your care-partner.

## 2019-09-29 NOTE — Op Note (Signed)
[place] Patient Name: Raven [name] Procedure Date: 09/29/2019 3:07 PM MRN: [id number] Endoscopist: Remo Lipps P. Havery Moros , MD Age: 38 Referring MD:  Date of Birth: 03-07-1981 Gender: Female Account #: [id number] Procedure:                Upper GI endoscopy Indications:              Dysphagia, Globus sensation - patient had                            significant benefit s/p empiric dilation in June,                            with recurrent symptoms, also seeing ENT for vocal                            cord dysfunction Medicines:                Monitored Anesthesia Care Procedure:                Pre-Anesthesia Assessment:                           - Prior to the procedure, a History and Physical                            was performed, and patient medications and                            allergies were reviewed. The patient's tolerance of                            previous anesthesia was also reviewed. The risks                            and benefits of the procedure and the sedation                            options and risks were discussed with the patient.                            All questions were answered, and informed consent                            was obtained. Prior Anticoagulants: The patient has                            taken no previous anticoagulant or antiplatelet                            agents. ASA Grade Assessment: II - A patient with                            mild systemic disease. After reviewing the risks  and benefits, the patient was deemed in                            satisfactory condition to undergo the procedure.                           After obtaining informed consent, the endoscope was                            passed under direct vision. Throughout the                            procedure, the patient's blood pressure, pulse, and                            oxygen saturations were monitored  continuously. The                            Endoscope was introduced through the mouth, and                            advanced to the second part of duodenum. The upper                            GI endoscopy was accomplished without difficulty.                            The patient tolerated the procedure well. Scope In: Scope Out: Findings:                 Esophagogastric landmarks were identified: the                            Z-line was found at 39 cm, the gastroesophageal                            junction was found at 39 cm and the upper extent of                            the gastric folds was found at 39 cm from the                            incisors.                           The exam of the esophagus was otherwise normal.                           A guidewire was placed and the scope was withdrawn.                            Empiric dilation was performed in the entire  esophagus with a Savary dilator with mild                            resistance at 17 mm and then at 18 mm. Relook                            endoscopy showed an appropriate mucosal wrent at                            the UES / upper esophagus.                           The entire examined stomach was normal.                           The duodenal bulb and second portion of the                            duodenum were normal. Complications:            No immediate complications. Estimated blood loss:                            Minimal. Estimated Blood Loss:     Estimated blood loss was minimal. Impression:               - Esophagogastric landmarks identified.                           - Normal esophagus - empiric dilation performed to                            55mm with good result, patient appears to have a                            subtle stenosis at UES which was dilated                           - Normal stomach.                           - Normal duodenal bulb and second  portion of the                            duodenum. Recommendation:           - Patient has a contact number available for                            emergencies. The signs and symptoms of potential                            delayed complications were discussed with the                            patient. Return to normal activities tomorrow.  Written discharge instructions were provided to the                            patient.                           - Post dilation diet.                           - Continue present medications.                           - Follow up with ENT for VCD, await course post                            dilation Steven P. Havery Moros, MD 09/29/2019 3:26:35 PM This report has been signed electronically.

## 2019-09-29 NOTE — Progress Notes (Signed)
Called to room to assist during endoscopic procedure.  Patient ID and intended procedure confirmed with present staff. Received instructions for my participation in the procedure from the performing physician.  

## 2019-09-29 NOTE — Progress Notes (Signed)
To PACU, VSS. Report to [email or web address] 

## 2019-10-03 ENCOUNTER — Telehealth: Payer: Self-pay

## 2019-10-03 NOTE — Telephone Encounter (Signed)
  Follow up Call-  Call back number 09/29/2019 06/08/2019  Post procedure Call Back phone  # [phone]  Permission to leave phone message Yes Yes  Some recent data might be hidden     Patient questions:  Do you have a fever, pain , or abdominal swelling? No. Pain Score  0 *  Have you tolerated food without any problems? Yes.    Have you been able to return to your normal activities? Yes.    Do you have any questions about your discharge instructions: Diet   No. Medications  No. Follow up visit  No.  Do you have questions or concerns about your Care? No.  Actions: * If pain score is 4 or above: No action needed, pain <4. 1. Have you developed a fever since your procedure? no  2.   Have you had an respiratory symptoms (SOB or cough) since your procedure? no  3.   Have you tested positive for COVID 19 since your procedure no  4.   Have you had any family members/close contacts diagnosed with the COVID 19 since your procedure?  no   If yes to any of these questions please route to Joylene John, RN and Alphonsa Gin, Therapist, sports.

## 2020-01-18 ENCOUNTER — Ambulatory Visit: Payer: Medicaid Other | Admitting: Gastroenterology

## 2020-01-18 NOTE — Progress Notes (Signed)
No show letter sent to patient

## 2020-01-18 NOTE — Progress Notes (Deleted)
HPI :  39 y/o female here for a follow up visit. She has a history  of dysphagia, reflux, chronic abdominal pain, vocal cord dysfunction, chronic constipation, chronic narcotic use.  Please see my intake note from 09/03/2018 for full details of her case. She has had EGDs in the past, barium swallow, esophageal manometry testing, CT imaging of the neck, in the past for symptoms. 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  She has had ongoing dysphagia, she feels it in her throat and in her chest. Extensive evaluation as mentioned. Most recently she had another esophageal manometry in Feb 2020 and a barium swallow on 01/27/19 which were both normal. Thought to be oropharyngeal perhaps. She is adamant she has had benefit from her last dilation which was done empirically in 2009 to 61mm, although she can't remember how long it lasted. She reports odynophagia for the past 2 months which has been new for her. It comes and goes but she has been eating less due to symptoms. She has discomfort with each swallow that goes from her throat down her entire chest. She also has ongoing pyrosis with water brash bothering her. She previously was using prevacid solutabs but stopped at some point. She cannot tolerate pills very well due to her symptoms. She is using gaviscon gummies which do help her symptoms. She also has some regurgitation and gagging when sleeping which bothers her, sleeping with her head of bed elevated. Her last EGD in 2015 was normal.   Otherwise since her last visit with me she saw Dr. Silverio Decamp for an acute visit. She was placed on Linzes 228mcg once daily which has helped with cramping and constipation. She is having multiple BMs per day which is helping to reduce her pain / cramping. She is also using Bentyl once daily - she is using at least once daily. She thinks it has been helping, reduces her cramping in her  abdomen.  She has completely stopped smoking cigarettes.   She otherwise is requesting a surveillance colonoscopy. She had an 83mm ascending colon adenoma removed in 2015 at the age of 21.   Manometry done again Feb 2020 - normal Barium swallow 01/27/19 - reflux noted, otherwise normal exam  Modified barium swallow 01/26/2018 - normal CT neck 12/10/2017 - normal  Esophageal manometry 02/23/15 - normal, no evidence of dysmotility Barium swallow 10/04/2014 - incomplete clearance of contrast in upper esophagus, absent secondary peristalsis, concern for dysmotility  Colonoscopy 05/10/2014 - 34mm ascending colon polyp, 16mm ascending colon polyp, diverticulosis sigmoid colon - one polyp TA, one polyp HP EGD 04/13/2014 - normal  Barium study 2010 normal  39 y/o female here for reassessment of the following:  GERD / Dysphagia / Odynophagia - longstanding dysphagia which may be orophayngeal, she has vocal cord dysfunction which is driving a lot of her symptoms. However, she has worsening pyrosis / regurgitation now as well as odynophagia which is relatively new for her. Intolerant of capsules / tablets, using prevacid solutab but stopped. Discussed options, recommend liquid protonix to see if she would tolerate that better, 40mg  / day, and liquid carafate which she can use PRN. She is asking for endoscopy with empiric dilation as that has helped her symptoms in the past and it has been sometime since she has had this. I offered it to her given her odynophagia which is new, assess for esophagitis, ensure no candidiasis, and  can perform empiric dilation. I discussed risks / benefits and she wanted to proceed. Further recs pending results and her course.   Chronic constipation / narcotic use - continue Linzess, bentyl  History of colon adenoma - 60mm adenoma removed at age 75. At the time it was recommended she have a colonoscopy in 2020, 5 years from her last exam, based on guidelines at that time.  Now, updated current guidelines would recommend a 7 year follow up for that type of polyp, however the guidelines do not address surveillance of polyps in patients at a younger age group. Given her younger age at first adenoma, I offered her a surveillance colonoscopy now, and she strongly wished to proceed. This will be done at same time as EGD.    Colonoscopy 06/08/19 - Hemorrhoids found on perianal exam. - One 3 mm polyp in the cecum, removed with a cold snare. Resected and retrieved. - Diverticulosis in the ascending colon and in the left colon. - Internal hemorrhoids. - The examination was otherwise normal.  EGD 06/08/19 - Esophagogastric landmarks identified. - Empiric dilation performed to 71mm with appropriate mucosal wrent noted at the UES, suspect mild stenosis there causing symptoms - Biopsies taken of the esophagus to rule out EoE. - Normal stomach. - Normal duodenal bulb and second portion of the duodenum.  EGD 09/29/19- Esophagogastric landmarks identified. - Normal esophagus - empiric dilation performed to 78mm with good result, patient appears to have a subtle stenosis at UES which was dilated - Normal stomach. - Normal duodenal bulb and second portion of the duodenum.      Past Medical History:  Diagnosis Date   Abnormal Pap smear    Achalasia    esphog stretched several times   Anxiety    Arthritis    Asthma    rarely uses inhaler   Blood transfusion 01/2006   Stout   Cardiomyopathy ([place])    Constipation due to opioid therapy    Depression    Dysrhythmia    Fibromyalgia    GERD (gastroesophageal reflux disease)    no meds - diet controlled   Heart murmur    Hiatal hernia    History of abnormal Pap smear    History of chlamydia 1990's   HSV-2 infection    HTN (hypertension)    Occipital neuralgia of right side 04/21/2017   Pneumonia 05/2013   PVC's (premature ventricular contractions)    Shortness of breath dyspnea    Status post  dilation of esophageal narrowing    Vaginal Pap smear, abnormal    Vocal cord paralysis      Past Surgical History:  Procedure Laterality Date   ADENOIDECTOMY  12/2002   BLADDER SUSPENSION  06/11/2012   Procedure: TRANSVAGINAL TAPE (TVT) PROCEDURE;  Surgeon: Delice Lesch, MD;  Location: [facility];  Service: Gynecology;  Laterality: N/A;  Tension Free Vaginal Tape/cystoscopy   BREAST SURGERY  07/2001   tumor removed right breast   CHOLECYSTECTOMY N/A 01/23/2015   Procedure: LAPAROSCOPIC CHOLECYSTECTOMY ;  Surgeon: Fanny Skates, MD;  Location: [facility];  Service: General;  Laterality: N/A;  case booked with cholangiogram but MD did not complete intraoperative cholangiogram    CYSTOSCOPY  06/11/2012   Procedure: CYSTOSCOPY;  Surgeon: Delice Lesch, MD;  Location: [facility];  Service: Gynecology;  Laterality: N/A;   DILATION AND CURETTAGE OF UTERUS  1999   DILATION AND CURETTAGE OF UTERUS  01/2006   retained products   ESOPHAGEAL MANOMETRY N/A 02/23/2019  Procedure: ESOPHAGEAL MANOMETRY (EM);  Surgeon: Mauri Pole, MD;  Location: [facility];  Service: Endoscopy;  Laterality: N/A;   ESOPHAGOGASTRODUODENOSCOPY     with esophageal dilation x 3   FOREIGN BODY REMOVAL  06/11/2012   Procedure: REMOVAL FOREIGN BODY EXTREMITY;  Surgeon: Delice Lesch, MD;  Location: [facility];  Service: Gynecology;  Laterality: Right;  Removal of Implanon    LAPAROSCOPIC TUBAL LIGATION  06/11/2012   Procedure: LAPAROSCOPIC TUBAL LIGATION;  Surgeon: Delice Lesch, MD;  Location: [facility];  Service: Gynecology;  Laterality: Bilateral;  Laparoscopic Bilateral Fulgeration   TONSILLECTOMY     TUBAL LIGATION     Family History  Problem Relation Age of Onset   Heart disease Father    Hypertension Father    Stroke Mother    Hypertension Mother    Heart attack Mother    Breast cancer Mother    Neuropathy Mother    Arthritis Mother    Asthma Mother    Peripheral Artery Disease Mother  30       had her leg amputated 2020   Neuropathy Sister    Arthritis Sister    Asthma Sister    Stomach cancer Paternal Uncle    Colon cancer Neg Hx    Esophageal cancer Neg Hx    Rectal cancer Neg Hx    Social History   Tobacco Use   Smoking status: Former Smoker    Packs/day: 0.25    Years: 15.00    Pack years: 3.75    Types: Cigarettes    Quit date: 04/29/2019    Years since quitting: 0.7   Smokeless tobacco: Never Used   Tobacco comment: stopped smoking 04-29-2019  Substance Use Topics   Alcohol use: No    Comment: on occasion   Drug use: No   Current Outpatient Medications  Medication Sig Dispense Refill   atenolol (TENORMIN) 25 MG tablet Take 1 tablet by mouth daily.     busPIRone (BUSPAR) 7.5 MG tablet Take 1 tablet by mouth daily.     clonazePAM (KLONOPIN) 2 MG tablet Take 1 tablet (2 mg total) by mouth every 8 (eight) hours. 12 tablet 0   diclofenac (CATAFLAM) 50 MG tablet Take 1 tablet (50 mg total) by mouth 2 (two) times daily as needed. 60 tablet 0   diclofenac (VOLTAREN) 50 MG EC tablet Take 1 tablet (50 mg total) by mouth 2 (two) times daily. 60 tablet 1   dicyclomine (BENTYL) 10 MG capsule Take 1-2 every 8 hours as needed (Patient taking differently: Take 10 mg by mouth 3 (three) times daily as needed for spasms. Take 1-2 every 8 hours as needed) 60 capsule 3   FLUoxetine (PROZAC) 20 MG/5ML solution Take 10 mg by mouth daily.   3   hydrOXYzine (ATARAX/VISTARIL) 50 MG tablet Take 50 mg by mouth every 6 (six) hours as needed for anxiety.   0   LATUDA 60 MG TABS Take 1 tablet by mouth daily.   0   lidocaine (XYLOCAINE) 2 % solution Use as directed 15 mLs in the mouth or throat every 4 (four) hours as needed for mouth pain. Swish and spit 100 mL 0   linaclotide (LINZESS) 290 MCG CAPS capsule Take 1 capsule (290 mcg total) by mouth daily before breakfast. 90 capsule 3   naproxen (NAPROSYN) 375 MG tablet Take 1 tablet (375 mg total) by mouth 2  (two) times daily. 20 tablet 0   oxyCODONE-acetaminophen (PERCOCET) 10-325 MG tablet Take  1 tablet by mouth 4 (four) times daily.   0   pantoprazole sodium (PROTONIX) 40 mg/20 mL PACK Take 20 mLs (40 mg total) by mouth daily. 600 mL 11   traZODone (DESYREL) 50 MG tablet Take 50 mg by mouth at bedtime.   0   No current facility-administered medications for this visit.   Allergies  Allergen Reactions   Dust Mite Extract Shortness Of Breath   Other Shortness Of Breath    Feathers, trees "almost everything outside"   Pollen Extract Shortness Of Breath     Review of Systems: All systems reviewed and negative except where noted in HPI.    No results found.  Physical Exam: There were no vitals taken for this visit. Constitutional: Pleasant,well-developed, ***female in no acute distress. HEENT: Normocephalic and atraumatic. Conjunctivae are normal. No scleral icterus. Neck supple.  Cardiovascular: Normal rate, regular rhythm.  Pulmonary/chest: Effort normal and breath sounds normal. No wheezing, rales or rhonchi. Abdominal: Soft, nondistended, nontender. Bowel sounds active throughout. There are no masses palpable. No hepatomegaly. Extremities: no edema Lymphadenopathy: No cervical adenopathy noted. Neurological: Alert and oriented to person place and time. Skin: Skin is warm and dry. No rashes noted. Psychiatric: Normal mood and affect. Behavior is normal.   ASSESSMENT AND PLAN:  Lin Landsman, MD

## 2020-02-10 ENCOUNTER — Telehealth: Payer: Self-pay

## 2020-02-10 NOTE — Telephone Encounter (Signed)
NOTES ON FILE FROM [facility] [phone], SENT REFERRAL TO SCHEDULING

## 2020-05-08 ENCOUNTER — Ambulatory Visit: Payer: Medicaid Other | Admitting: Gastroenterology

## 2020-05-08 ENCOUNTER — Other Ambulatory Visit: Payer: Self-pay

## 2020-05-08 VITALS — BP 128/79 | HR 100 | Temp 98.2°F | Ht 69.0 in | Wt 185.0 lb

## 2020-05-08 DIAGNOSIS — R1310 Dysphagia, unspecified: Secondary | ICD-10-CM | POA: Diagnosis not present

## 2020-05-08 DIAGNOSIS — K219 Gastro-esophageal reflux disease without esophagitis: Secondary | ICD-10-CM

## 2020-05-08 DIAGNOSIS — K5909 Other constipation: Secondary | ICD-10-CM

## 2020-05-08 DIAGNOSIS — R490 Dysphonia: Secondary | ICD-10-CM

## 2020-05-08 MED ORDER — PANTOPRAZOLE SODIUM 40 MG PO PACK: 40.0000 mg | PACK | Freq: Every day | ORAL | 11 refills | Status: DC

## 2020-05-08 MED ORDER — DICYCLOMINE HCL 10 MG PO CAPS: ORAL_CAPSULE | ORAL | 3 refills | Status: DC

## 2020-05-08 MED ORDER — LINACLOTIDE 290 MCG PO CAPS: 290.0000 ug | ORAL_CAPSULE | Freq: Every day | ORAL | 3 refills | Status: DC

## 2020-05-08 NOTE — Patient Instructions (Addendum)
If you are age 40 or older, your body mass index should be between 23-30. Your Body mass index is 27.32 kg/m. If this is out of the aforementioned range listed, please consider follow up with your Primary Care Provider.  If you are age 61 or younger, your body mass index should be between 19-25. Your Body mass index is 27.32 kg/m. If this is out of the aformentioned range listed, please consider follow up with your Primary Care Provider.   We have sent the following medications to your pharmacy for you to pick up at your convenience: Protonix 40 mg/20 ml suspension: Take once daily Bentyl 10 mg: Take 1 to 2 tablets every 8 hours as needed Linzess 290 mcg: Take daily before breakfast  Please follow up as needed.  Thank you for entrusting me with your care and for choosing [facility], Dr. Dodge Cellar

## 2020-05-08 NOTE — Progress Notes (Signed)
HPI :  39 year old female here for follow-up visit.  She has a history of chronic oropharyngeal dysphagia, reflux, vocal cord dysfunction, chronic narcotic use with chronic constipation.  See prior notes for full details of her case.  She has had an extensive evaluation in the past for the symptoms including multiple EGDs, barium swallow -regular and modified, esophageal manometry testing, CT imaging of the neck.  Laryngoscopies.  She has been followed by ENT for this issue.  I performed an EGD with empiric dilation in June 2020.  This provided her some benefit of her symptoms for a few months, however symptoms recurred and she requests another EGD in October.  Again the dilation to 18 mm savory was done.  She felt that she had a very good result for a few months but then benefit again waned. She has completely stopped smoking cigarettes.   She continues to complain of intermittent altered voice.  She has a lot of gagging in her throat and feels a sense of tightness there.  She has a sense of globus and sense of choking in her throat with swallows at times.  She has been on liquid Protonix and tolerates it well.  She denies much of any heartburn that bothers her.  She is able to eat, just states a sense of globus in her throat that alters her voice.  She takes chronic pain medications, she has been on Linzess to 90 mcg once daily for constipation and states it works quite well.  She is asking for refill that today.  She is also asking for refill of Bentyl which has worked well for her periodic abdominal cramps.  Colonoscopy up-to-date as below.  Since our last visit she was seen by ENT last week. Video larynostroboscopy on 05/03/20- Salient features from that report include: Severe to profound anterior to posterior compression (left arytenoid touches petiole of epiglottis) during phonation reflective of muscle tension dysphonia/laryngeal spasm; bilateral vocal fold fullness likely secondary to smoking  history (mild polypoid degeneration).  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  She has a 4-6 therapy session planned to meet once a week over the next 4 to 6 weeks.   Prior work-up: Manometry done again Feb 2020 - normal Barium swallow 01/27/19 - reflux noted, otherwise normal exam  Modified barium swallow 01/26/2018 - normal CT neck 12/10/2017 - normal  Esophageal manometry 02/23/15 - normal, no evidence of dysmotility Barium swallow 10/04/2014 - incomplete clearance of contrast in upper esophagus, absent secondary peristalsis, concern for dysmotility  Colonoscopy 05/10/2014 - 64mm ascending colon polyp, 13mm ascending colon polyp, diverticulosis sigmoid colon - one polyp TA, one polyp HP EGD 04/13/2014 - normal  Barium study 2010 normal   EGD 09/29/19 - Esophagogastric landmarks identified. - Normal esophagus - empiric dilation performed to 79mm with good result, patient appears to have a subtle stenosis at UES which was dilated - Normal stomach. - Normal duodenal bulb and second portion of the duodenum.   EGD 06/08/19 -  - The exam of the esophagus was otherwise normal. - A guidewire was placed and the scope was withdrawn. Empiric dilation was performed in the entire esophagus with a Savary dilator with mild resistance at 17 mm and 18 mm. Relook endoscopy showed an appropriate mucosal wrent at the proximal esophagus / UES, suspect subtle stenosis there was causing dysphagia. Biopsies were taken with a cold forceps in the upper third of the esophagus,  in the middle third of the esophagus and in the lower third of the esophagus for histology, rule out EoE. - The entire examined stomach was normal. - The duodenal bulb and second portion of the duodenum were normal.  Path benign  Colonoscopy 06/08/19 - Hemorrhoids were found on perianal exam. - A 3 mm polyp was found in the cecum. The polyp was sessile. The polyp  was removed with a cold snare. Resection and retrieval were complete. - Multiple small-mouthed diverticula were found in the ascending colon and left colon. - Internal hemorrhoids were found during retroflexion. - The exam was otherwise without abnormality.  Path adenoma - repeat colonoscopy in 7 years   Seen by speech path - video larynostroboscopy on 05/03/20- Salient features from that report include: Severe to profound anterior to posterior compression (left arytenoid touches petiole of epiglottis) during phonation reflective of muscle tension dysphonia/laryngeal spasm; bilateral vocal fold fullness likely secondary to smoking history (mild polypoid degeneration).    Past Medical History:  Diagnosis Date  . Abnormal Pap smear   . Achalasia    esphog stretched several times  . Anxiety   . Arthritis   . Asthma    rarely uses inhaler  . Blood transfusion 01/2006   [place]  . Cardiomyopathy ([place])   . Constipation due to opioid therapy   . Depression   . Dysrhythmia   . Fibromyalgia   . GERD (gastroesophageal reflux disease)    no meds - diet controlled  . Heart murmur   . Hiatal hernia   . History of abnormal Pap smear   . History of chlamydia 1990's  . HSV-2 infection   . HTN (hypertension)   . Occipital neuralgia of right side 04/21/2017  . Pneumonia 05/2013  . PVC's (premature ventricular contractions)   . Shortness of breath dyspnea   . Status post dilation of esophageal narrowing   . Vaginal Pap smear, abnormal   . Vocal cord paralysis      Past Surgical History:  Procedure Laterality Date  . ADENOIDECTOMY  12/2002  . BLADDER SUSPENSION  06/11/2012   Procedure: TRANSVAGINAL TAPE (TVT) PROCEDURE;  Surgeon: Delice Lesch, MD;  Location: [facility];  Service: Gynecology;  Laterality: N/A;  Tension Free Vaginal Tape/cystoscopy  . BREAST SURGERY  07/2001   tumor removed right breast  . CHOLECYSTECTOMY N/A 01/23/2015   Procedure: LAPAROSCOPIC CHOLECYSTECTOMY ;  Surgeon: Fanny Skates, MD;  Location: [facility];  Service: General;  Laterality: N/A;  case booked with cholangiogram but MD did not complete intraoperative cholangiogram   . CYSTOSCOPY  06/11/2012   Procedure: CYSTOSCOPY;  Surgeon: Delice Lesch, MD;  Location: [facility];  Service: Gynecology;  Laterality: N/A;  . DILATION AND CURETTAGE OF UTERUS  1999  . DILATION AND CURETTAGE OF UTERUS  01/2006   retained products  . ESOPHAGEAL MANOMETRY N/A 02/23/2019   Procedure: ESOPHAGEAL MANOMETRY (EM);  Surgeon: Mauri Pole, MD;  Location: [facility];  Service: Endoscopy;  Laterality: N/A;  . ESOPHAGOGASTRODUODENOSCOPY     with esophageal dilation x 3  . FOREIGN BODY REMOVAL  06/11/2012   Procedure: REMOVAL FOREIGN BODY EXTREMITY;  Surgeon: Delice Lesch, MD;  Location: [facility];  Service: Gynecology;  Laterality: Right;  Removal of Implanon   . LAPAROSCOPIC TUBAL LIGATION  06/11/2012   Procedure: LAPAROSCOPIC TUBAL LIGATION;  Surgeon: Delice Lesch, MD;  Location: [facility];  Service: Gynecology;  Laterality: Bilateral;  Laparoscopic Bilateral Fulgeration  . TONSILLECTOMY    .  TUBAL LIGATION     Family History  Problem Relation Age of Onset  . Heart disease Father   . Hypertension Father   . Stroke Mother   . Hypertension Mother   . Heart attack Mother   . Breast cancer Mother   . Neuropathy Mother   . Arthritis Mother   . Asthma Mother   . Peripheral Artery Disease Mother 21       had her leg amputated 2020  . Neuropathy Sister   . Arthritis Sister   . Asthma Sister   . Stomach cancer Paternal Uncle   . Colon cancer Neg Hx   . Esophageal cancer Neg Hx   . Rectal cancer Neg Hx    Social History   Tobacco Use  . Smoking status: Former Smoker    Packs/day: 0.25    Years: 15.00    Pack years: 3.75    Types: Cigarettes    Quit date: 04/29/2019    Years since quitting: 1.0  . Smokeless tobacco: Never Used  . Tobacco comment: stopped smoking 04-29-2019  Substance Use Topics  . Alcohol use: No     Comment: on occasion  . Drug use: No   Current Outpatient Medications  Medication Sig Dispense Refill  . atenolol (TENORMIN) 25 MG tablet Take 1 tablet by mouth daily.    . busPIRone (BUSPAR) 7.5 MG tablet Take 1 tablet by mouth daily.    . clonazePAM (KLONOPIN) 2 MG tablet Take 1 tablet (2 mg total) by mouth every 8 (eight) hours. 12 tablet 0  . diclofenac (CATAFLAM) 50 MG tablet Take 1 tablet (50 mg total) by mouth 2 (two) times daily as needed. 60 tablet 0  . diclofenac (VOLTAREN) 50 MG EC tablet Take 1 tablet (50 mg total) by mouth 2 (two) times daily. 60 tablet 1  . dicyclomine (BENTYL) 10 MG capsule Take 1-2 every 8 hours as needed (Patient taking differently: Take 10 mg by mouth 3 (three) times daily as needed for spasms. Take 1-2 every 8 hours as needed) 60 capsule 3  . FLUoxetine (PROZAC) 20 MG/5ML solution Take 10 mg by mouth daily.   3  . hydrOXYzine (ATARAX/VISTARIL) 50 MG tablet Take 50 mg by mouth every 6 (six) hours as needed for anxiety.   0  . LATUDA 60 MG TABS Take 1 tablet by mouth daily.   0  . lidocaine (XYLOCAINE) 2 % solution Use as directed 15 mLs in the mouth or throat every 4 (four) hours as needed for mouth pain. Swish and spit 100 mL 0  . linaclotide (LINZESS) 290 MCG CAPS capsule Take 1 capsule (290 mcg total) by mouth daily before breakfast. 90 capsule 3  . naproxen (NAPROSYN) 375 MG tablet Take 1 tablet (375 mg total) by mouth 2 (two) times daily. 20 tablet 0  . oxyCODONE-acetaminophen (PERCOCET) 10-325 MG tablet Take 1 tablet by mouth 4 (four) times daily.   0  . pantoprazole sodium (PROTONIX) 40 mg/20 mL PACK Take 20 mLs (40 mg total) by mouth daily. 600 mL 11  . traZODone (DESYREL) 50 MG tablet Take 50 mg by mouth at bedtime.   0   No current facility-administered medications for this visit.   Allergies  Allergen Reactions  . Dust Mite Extract Shortness Of Breath  . Other Shortness Of Breath    Feathers, trees "almost everything outside"  . Pollen  Extract Shortness Of Breath     Review of Systems: All systems reviewed and negative except where  noted in HPI.   Lab Results  Component Value Date   WBC 9.0 04/29/2019   HGB 12.8 04/29/2019   HCT 37.0 04/29/2019   MCV 88.7 04/29/2019   PLT 202 04/29/2019    Lab Results  Component Value Date   CREATININE 0.88 04/29/2019   BUN 6 04/29/2019   NA 141 04/29/2019   K 3.2 (L) 04/29/2019   CL 108 04/29/2019   CO2 21 (L) 04/29/2019    Lab Results  Component Value Date   ALT 24 04/29/2019   AST 23 04/29/2019   ALKPHOS 61 04/29/2019   BILITOT 0.5 04/29/2019     Physical Exam: BP 128/79   Pulse 100   Temp 98.2 F (36.8 C)   Ht 5\' 9"  (1.753 m)   Wt 185 lb (83.9 kg)   BMI 27.32 kg/m  Constitutional: Pleasant,well-developed, female in no acute distress. Neurological: Alert and oriented to person place and time. Skin: Skin is warm and dry. No rashes noted. Psychiatric: Normal mood and affect. Behavior is normal.   ASSESSMENT AND PLAN: 39 year old female here for reassessment the following:  Oropharyngeal dysphagia / muscle tension dysphonia / GERD - she has had an extensive evaluation as outlined above.  I agree with her ENT physician and that this appears to be oropharyngeal, suspected muscle tension dysphagia/dysphonia, perhaps mild cricopharyngeal stenosis associated with this. Dilation provides some self-limited improvement but certainly not lasting or a cure. Her ENT has recommended a course of speech pathology which is very reasonable at this time.  I recommend we await her course with that as she just started it, we will see how she is doing in 6 weeks and then if no significant improvement I can perform dilation as needed if she really finds benefit from this, but no clear endpoint with that strategy.  Her reflux appears well controlled on liquid Protonix and will continue that for now.  I think yield of a pH study to rule out refractory reflux driving this is likely low  given she has no pyrosis or reflux symptoms on her medications, we can consider that pending her course.  She agreed with the plan, refilled her Protonix, she can follow-up with me as needed if symptoms persist moving forward despite course of speech pathology.  Chronic constipation - doing quite well on Linzess and Bentyl, working well, narcotics likely driving this process.  Refilled her regimen she can follow-up as needed for this issue  Manville Cellar, MD [facility]

## 2020-05-29 ENCOUNTER — Ambulatory Visit (INDEPENDENT_AMBULATORY_CARE_PROVIDER_SITE_OTHER): Payer: Medicaid Other | Admitting: Internal Medicine

## 2020-05-29 ENCOUNTER — Encounter: Payer: Self-pay | Admitting: Internal Medicine

## 2020-05-29 ENCOUNTER — Other Ambulatory Visit: Payer: Self-pay

## 2020-05-29 ENCOUNTER — Telehealth: Payer: Self-pay

## 2020-05-29 VITALS — BP 124/78 | HR 92 | Ht 69.0 in | Wt 198.2 lb

## 2020-05-29 DIAGNOSIS — I10 Essential (primary) hypertension: Secondary | ICD-10-CM

## 2020-05-29 DIAGNOSIS — I493 Ventricular premature depolarization: Secondary | ICD-10-CM

## 2020-05-29 DIAGNOSIS — I428 Other cardiomyopathies: Secondary | ICD-10-CM

## 2020-05-29 DIAGNOSIS — M797 Fibromyalgia: Secondary | ICD-10-CM | POA: Diagnosis not present

## 2020-05-29 DIAGNOSIS — R002 Palpitations: Secondary | ICD-10-CM

## 2020-05-29 NOTE — Patient Instructions (Signed)
Medication Instructions:  Your physician recommends that you continue on your current medications as directed. Please refer to the Current Medication list given to you today.  *If you need a refill on your cardiac medications before your next appointment, please call your pharmacy*  Testing/Procedures: Your physician has requested that you have an echocardiogram. Echocardiography is a painless test that uses sound waves to create images of your heart. It provides your doctor with information about the size and shape of your heart and how well your heart's chambers and valves are working. This procedure takes approximately one hour. There are no restrictions for this procedure. This will be done at our [facility] location:  [place] has recommended that you wear an event monitor (3 day). Event monitors are medical devices that record the heart's electrical activity. Doctors most often [place] these monitors to diagnose arrhythmias. Arrhythmias are problems with the speed or rhythm of the heartbeat. The monitor is a small, portable device. You can wear one while you do your normal daily activities. This is usually used to diagnose what is causing palpitations/syncope (passing out).  Follow-Up: At [facility], you and your health needs are our priority.  As part of our continuing mission to provide you with exceptional heart care, we have created designated Provider Care Teams.  These Care Teams include your primary Cardiologist (physician) and Advanced Practice Providers (APPs -  Physician Assistants and Nurse Practitioners) who all work together to provide you with the care you need, when you need it.  We recommend signing up for the patient portal called "MyChart".  Sign up information is provided on this After Visit Summary.  MyChart is used to connect with patients for Virtual Visits (Telemedicine).  Patients are able to view lab/test results, encounter notes,  upcoming appointments, etc.  Non-urgent messages can be sent to your provider as well.   To learn more about what you can do with MyChart, go to [email or web address].    Your next appointment:   1 month(s)  The format for your next appointment:   In Person  Provider:   Cherlynn Kaiser, MD

## 2020-05-29 NOTE — Telephone Encounter (Signed)
LM for pt to call back to go over monitor instructions.

## 2020-06-06 NOTE — Telephone Encounter (Signed)
Patient called stating that still hasn't received her monitor. She would like a call back.

## 2020-06-06 NOTE — Telephone Encounter (Signed)
As of 06/06/2020 [employer] has not shipped the cardiac event monitor which was enrolled 05/31/2020.  Explained to patient, monitor company is having inventory problems.  Patient accepted offer to have monitor applied at the [facility] office 06/07/20, from our office inventory.  [employer] contacted to cancel 05/31/2020 enrollment for monitor to be shipped to her home.

## 2020-06-07 ENCOUNTER — Other Ambulatory Visit: Payer: Self-pay

## 2020-06-07 ENCOUNTER — Encounter: Payer: Self-pay | Admitting: *Deleted

## 2020-06-07 ENCOUNTER — Ambulatory Visit (INDEPENDENT_AMBULATORY_CARE_PROVIDER_SITE_OTHER): Payer: Medicaid Other

## 2020-06-07 DIAGNOSIS — I493 Ventricular premature depolarization: Secondary | ICD-10-CM | POA: Diagnosis not present

## 2020-06-07 NOTE — Progress Notes (Signed)
Patient ID: Raven [name], female   DOB: 1981-07-06, 39 y.o.   MRN: [id number] [employer] 30 day cardiac event monitor applied from office inventory.

## 2020-06-13 ENCOUNTER — Encounter: Payer: Self-pay | Admitting: Gastroenterology

## 2020-06-13 ENCOUNTER — Telehealth: Payer: Self-pay | Admitting: Internal Medicine

## 2020-06-13 NOTE — Telephone Encounter (Signed)
New Message:     Pt been wearing a Monitor. She now have a bad rash, blisters and itching. Pt says she have stopped wearing the Monitor.

## 2020-06-13 NOTE — Telephone Encounter (Signed)
Spoke with the patient who states she has been wearing the monitor for the past five days and had to take it off today. She has been having a bad reaction to the adhesive which has caused itching, redness and now blisters. I let the patient know that we may have some different adhesive that is more sensitive. She said she would be willing to give that a try. I will route to monitor team for advisement on how to proceed.

## 2020-06-14 ENCOUNTER — Telehealth: Payer: Self-pay

## 2020-06-14 NOTE — Telephone Encounter (Signed)
Returned call to patient. She is having an allergic reaction to her monitor. I gave her 2 options. She could call [employer] and request some sensitive skin electrodes or she could mail it back. Per the order her Dr only asked for 3 days and she is on day 5. I did confirm she was able to catch a few episodes in the 5 days she has worn it.

## 2020-06-14 NOTE — Telephone Encounter (Signed)
Called  [employer] to initiate PA for pantoprazole granules 40 mg /20 mL. Patient has dysphagia and esophageal dysmotility.  PA approval # [id number]. Approved from 06-14-20 thru 06-09-2021 Ref# [id number]. [facility] on [address] notified.

## 2020-06-15 ENCOUNTER — Other Ambulatory Visit: Payer: Self-pay

## 2020-06-15 ENCOUNTER — Encounter (HOSPITAL_COMMUNITY): Payer: Self-pay | Admitting: Pediatrics

## 2020-06-15 ENCOUNTER — Emergency Department (HOSPITAL_COMMUNITY): Payer: Medicaid Other

## 2020-06-15 ENCOUNTER — Other Ambulatory Visit (HOSPITAL_COMMUNITY): Payer: Medicaid Other

## 2020-06-15 ENCOUNTER — Emergency Department (HOSPITAL_COMMUNITY)
Admission: EM | Admit: 2020-06-15 | Discharge: 2020-06-15 | Disposition: A | Discharge status: Home / Self Care | Payer: Medicaid Other | Attending: Emergency Medicine | Admitting: Emergency Medicine

## 2020-06-15 DIAGNOSIS — Z87891 Personal history of nicotine dependence: Secondary | ICD-10-CM | POA: Diagnosis not present

## 2020-06-15 DIAGNOSIS — R0781 Pleurodynia: Secondary | ICD-10-CM | POA: Diagnosis not present

## 2020-06-15 DIAGNOSIS — Z79899 Other long term (current) drug therapy: Secondary | ICD-10-CM | POA: Diagnosis not present

## 2020-06-15 DIAGNOSIS — R1013 Epigastric pain: Secondary | ICD-10-CM | POA: Diagnosis not present

## 2020-06-15 DIAGNOSIS — J45909 Unspecified asthma, uncomplicated: Secondary | ICD-10-CM | POA: Diagnosis not present

## 2020-06-15 LAB — URINALYSIS, ROUTINE W REFLEX MICROSCOPIC
Bilirubin Urine: NEGATIVE
Glucose, UA: NEGATIVE mg/dL
Hgb urine dipstick: NEGATIVE
Ketones, ur: NEGATIVE mg/dL
Leukocytes,Ua: NEGATIVE
Nitrite: NEGATIVE
Protein, ur: NEGATIVE mg/dL
Specific Gravity, Urine: 1.014 (ref 1.005–1.030)
pH: 5 (ref 5.0–8.0)

## 2020-06-15 LAB — CBC
HCT: 41.2 % (ref 36.0–46.0)
Hemoglobin: 13.9 g/dL (ref 12.0–15.0)
MCH: 30.6 pg (ref 26.0–34.0)
MCHC: 33.7 g/dL (ref 30.0–36.0)
MCV: 90.7 fL (ref 80.0–100.0)
Platelets: 239 10*3/uL (ref 150–400)
RBC: 4.54 MIL/uL (ref 3.87–5.11)
RDW: 12.8 % (ref 11.5–15.5)
WBC: 10.2 10*3/uL (ref 4.0–10.5)
nRBC: 0 % (ref 0.0–0.2)

## 2020-06-15 LAB — COMPREHENSIVE METABOLIC PANEL
ALT: 27 U/L (ref 0–44)
AST: 23 U/L (ref 15–41)
Albumin: 4.4 g/dL (ref 3.5–5.0)
Alkaline Phosphatase: 54 U/L (ref 38–126)
Anion gap: 11 (ref 5–15)
BUN: 9 mg/dL (ref 6–20)
CO2: 22 mmol/L (ref 22–32)
Calcium: 9.3 mg/dL (ref 8.9–10.3)
Chloride: 108 mmol/L (ref 98–111)
Creatinine, Ser: 0.83 mg/dL (ref 0.44–1.00)
GFR calc Af Amer: >60 mL/min (ref >60)
GFR calc non Af Amer: >60 mL/min (ref >60)
Glucose, Bld: 109 mg/dL — ABNORMAL HIGH (ref 70–99)
Potassium: 3.6 mmol/L (ref 3.5–5.1)
Sodium: 141 mmol/L (ref 135–145)
Total Bilirubin: 0.7 mg/dL (ref 0.3–1.2)
Total Protein: 7.3 g/dL (ref 6.5–8.1)

## 2020-06-15 LAB — I-STAT BETA HCG BLOOD, ED (MC, WL, AP ONLY): I-stat hCG, quantitative: <5 m[IU]/mL (ref <5)

## 2020-06-15 LAB — LIPASE, BLOOD: Lipase: 29 U/L (ref 11–51)

## 2020-06-15 MED ORDER — ONDANSETRON HCL 4 MG/2ML IJ SOLN
4.0000 mg | Freq: Once | INTRAMUSCULAR | Status: AC
  Administered 2020-06-15: 4 mg via INTRAVENOUS
  Filled 2020-06-15: qty 2

## 2020-06-15 MED ORDER — MORPHINE SULFATE (PF) 4 MG/ML IV SOLN
4.0000 mg | Freq: Once | INTRAVENOUS | Status: AC
  Administered 2020-06-15: 4 mg via INTRAVENOUS
  Filled 2020-06-15: qty 1

## 2020-06-15 MED ORDER — SODIUM CHLORIDE 0.9% FLUSH
3.0000 mL | Freq: Once | INTRAVENOUS | Status: AC
  Administered 2020-06-15: 3 mL via INTRAVENOUS

## 2020-06-15 MED ORDER — IOHEXOL 350 MG/ML SOLN
100.0000 mL | Freq: Once | INTRAVENOUS | Status: AC | PRN
  Administered 2020-06-15: 100 mL via INTRAVENOUS

## 2020-06-15 NOTE — ED Triage Notes (Signed)
Patient c/o abdominal pain, chest pains and tightness. Stated she has been wearing holter monitor x 6 days. Endorsed hx of cardiomyopathy.

## 2020-06-15 NOTE — ED Notes (Signed)
Urine Culture sent with UA 

## 2020-06-15 NOTE — ED Provider Notes (Signed)
[facility] EMERGENCY DEPARTMENT Provider Note   CSN: [id number] Arrival date & time: 06/15/20  0813     History Chief Complaint  Patient presents with  . Abdominal Pain  . Palpitations    Raven [name] is a 39 y.o. female.  Patient is a 39 year old female with extensive past medical history including nonischemic cardiomyopathy, GERD, fibromyalgia, prior cholecystectomy, and anxiety.  Patient presents today for evaluation of pain to her right chest, right upper quadrant, and back.  This has been present since this morning.  She describes a fluttering in her chest along with this discomfort.  She denies any fevers, chills, or productive cough.  She denies any bowel or bladder complaints.  She has been seen by cardiology and is currently wearing a Holter monitor for recent episodes of palpitations.  The history is provided by the patient.       Past Medical History:  Diagnosis Date  . Abnormal Pap smear   . Achalasia    esphog stretched several times  . Anxiety   . Arthritis   . Asthma    rarely uses inhaler  . Blood transfusion 01/2006   [place]  . Cardiomyopathy ([place])   . Constipation due to opioid therapy   . Depression   . Dysrhythmia   . Fibromyalgia   . GERD (gastroesophageal reflux disease)    no meds - diet controlled  . Heart murmur   . Hiatal hernia   . History of abnormal Pap smear   . History of chlamydia 1990's  . HSV-2 infection   . HTN (hypertension)   . Occipital neuralgia of right side 04/21/2017  . Pneumonia 05/2013  . PVC's (premature ventricular contractions)   . Shortness of breath dyspnea   . Status post dilation of esophageal narrowing   . Vaginal Pap smear, abnormal   . Vocal cord paralysis     Patient Active Problem List   Diagnosis Date Noted  . Abnormal CXR 05/03/2019  . Globus sensation   . Chronic bilateral low back pain 09/07/2018  . Acute pain of both knees 09/07/2018  . 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 01/18/2018  . Chronically on opiate therapy 01/18/2018  . Excessive daytime sleepiness 01/18/2018  . Vivid dream 01/18/2018  . 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 Occipital neuralgia of right side 04/21/2017  . Fibromyalgia 04/21/2017  . UTI (urinary tract infection) 02/12/2017  . Gallstones and inflammation of gallbladder without obstruction 01/23/2015  . S/P laparoscopic surgery 01/23/2015  . Upper airway cough syndrome 09/05/2014  . Cigarette smoker 08/09/2014  . Dyspnea 08/08/2014  . Abdominal pain, left lower quadrant 03/27/2014  . Cardiomyopathy, idiopathic ([place]) 01/09/2014  . History of chlamydia 01/09/2013  . 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  . History of abnormal Pap smear 01/09/2013  . Achalasia   . Abnormal Pap smear   . Depression   . HSV-2 infection   . Heart palpitations   . Heart murmur   . Anxiety   . GERD (gastroesophageal reflux disease)   . 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 PVC's (premature ventricular contractions) 05/19/2011  . Palpitation 04/11/2011  . ESOPHAGEAL STRICTURE 08/10/2009  . Dysphagia 08/10/2009  . Esophageal reflux 11/30/2008  . Unspecified constipation 11/30/2008  . DEPRESSION 10/09/2008  . ASTHMA 10/09/2008    Past Surgical History:  Procedure Laterality Date  . ADENOIDECTOMY  12/2002  . BLADDER SUSPENSION  06/11/2012   Procedure: TRANSVAGINAL TAPE (TVT) PROCEDURE;  Surgeon: Delice Lesch, MD;  Location: [facility];  Service: Gynecology;  Laterality: N/A;  Tension Free Vaginal Tape/cystoscopy  . BREAST SURGERY  07/2001   tumor removed right breast  . CHOLECYSTECTOMY N/A 01/23/2015   Procedure: LAPAROSCOPIC CHOLECYSTECTOMY ;  Surgeon: Fanny Skates, MD;  Location: [facility];  Service: General;  Laterality: N/A;  case booked with cholangiogram but MD did not complete intraoperative cholangiogram   . CYSTOSCOPY  06/11/2012   Procedure:  CYSTOSCOPY;  Surgeon: Delice Lesch, MD;  Location: [facility];  Service: Gynecology;  Laterality: N/A;  . DILATION AND CURETTAGE OF UTERUS  1999  . DILATION AND CURETTAGE OF UTERUS  01/2006   retained products  . ESOPHAGEAL MANOMETRY N/A 02/23/2019   Procedure: ESOPHAGEAL MANOMETRY (EM);  Surgeon: Mauri Pole, MD;  [facility];  Service: Endoscopy;  Laterality: N/A;  . ESOPHAGOGASTRODUODENOSCOPY     with esophageal dilation x 3  . FOREIGN BODY REMOVAL  06/11/2012   Procedure: REMOVAL FOREIGN BODY EXTREMITY;  Surgeon: Delice Lesch, MD;  Location: [facility];  Service: Gynecology;  Laterality: Right;  Removal of Implanon   . LAPAROSCOPIC TUBAL LIGATION  06/11/2012   Procedure: LAPAROSCOPIC TUBAL LIGATION;  Surgeon: Delice Lesch, MD;  Location: [facility];  Service: Gynecology;  Laterality: Bilateral;  Laparoscopic Bilateral Fulgeration  . TONSILLECTOMY    . TUBAL LIGATION       OB History    Gravida  5   Para  2   Term  2   Preterm  0   AB  2   Living  2     SAB  2   TAB      Ectopic      Multiple      Live Births              Family History  Problem Relation Age of Onset  . Heart disease Father   . Hypertension Father   . Stroke Mother   . Hypertension Mother   . Heart attack Mother   . Breast cancer Mother   . Neuropathy Mother   . Arthritis Mother   . Asthma Mother   . Peripheral Artery Disease Mother 110       had her leg amputated 2020  . Neuropathy Sister   . Arthritis Sister   . Asthma Sister   . Stomach cancer Paternal Uncle   . Colon cancer Neg Hx   . Esophageal cancer Neg Hx   . Rectal cancer Neg Hx     Social History   Tobacco Use  . Smoking status: Former Smoker    Packs/day: 0.25    Years: 15.00    Pack years: 3.75    Types: Cigarettes    Quit date: 04/29/2019    Years since quitting: 1.1  . Smokeless tobacco: Never Used  . Tobacco comment: stopped smoking 04-29-2019  Vaping Use  . Vaping Use: Never used  Substance  Use Topics  . Alcohol use: No    Comment: on occasion  . Drug use: No    Home Medications Prior to Admission medications   Medication Sig Start Date End Date Taking? Authorizing Provider  atenolol (TENORMIN) 25 MG tablet Take 1 tablet by mouth daily. 08/26/19   [provider]  busPIRone (BUSPAR) 7.5 MG tablet Take 1 tablet by mouth daily. 08/25/19   [provider]  clonazePAM (KLONOPIN) 2 MG tablet Take 1 tablet (  2 mg total) by mouth every 8 (eight) hours. 02/14/19   Ward, Delice Bison, DO  diclofenac (CATAFLAM) 50 MG tablet Take 1 tablet (50 mg total) by mouth 2 (two) times daily as needed. 08/17/19   Aundra Dubin, PA-C  diclofenac (VOLTAREN) 50 MG EC tablet Take 1 tablet (50 mg total) by mouth 2 (two) times daily. 08/22/19   Aundra Dubin, PA-C  dicyclomine (BENTYL) 10 MG capsule Take 1-2 every 8 hours as needed 05/08/20   Armbruster, Carlota Raspberry, MD  FLUoxetine (PROZAC) 20 MG/5ML solution Take 10 mg by mouth daily.  01/13/18   [provider]  hydrOXYzine (ATARAX/VISTARIL) 50 MG tablet Take 50 mg by mouth every 6 (six) hours as needed for anxiety.  11/08/18   [provider]  LATUDA 60 MG TABS Take 1 tablet by mouth daily.  11/08/18   [provider]  lidocaine (XYLOCAINE) 2 % solution Use as directed 15 mLs in the mouth or throat every 4 (four) hours as needed for mouth pain. Swish and spit 01/08/19   Fawze, Mina A, PA-C  linaclotide (LINZESS) 290 MCG CAPS capsule Take 1 capsule (290 mcg total) by mouth daily before breakfast. 05/08/20   Armbruster, Carlota Raspberry, MD  oxyCODONE-acetaminophen (PERCOCET) 10-325 MG tablet Take 1 tablet by mouth 4 (four) times daily.  06/09/16   [provider]  pantoprazole sodium (PROTONIX) 40 mg/20 mL PACK Take 20 mLs (40 mg total) by mouth daily. 05/08/20   Armbruster, Carlota Raspberry, MD  traZODone (DESYREL) 50 MG tablet Take 50 mg by mouth at bedtime.  11/08/18   [provider]    Allergies    Dust mite  extract, Other, and Pollen extract  Review of Systems   Review of Systems  All other systems reviewed and are negative.   Physical Exam Updated Vital Signs BP 131/87 (BP Location: Right Arm)   Pulse 70   Temp 98.3 F (36.8 C) (Oral)   Resp 15   Ht 5\' 9"  (1.753 m)   Wt 86.2 kg   SpO2 100%   BMI 28.06 kg/m   Physical Exam Vitals and nursing note reviewed.  Constitutional:      General: She is not in acute distress.    Appearance: She is well-developed. She is not diaphoretic.  HENT:     Head: Normocephalic and atraumatic.  Cardiovascular:     Rate and Rhythm: Normal rate and regular rhythm.     Heart sounds: No murmur heard.  No friction rub. No gallop.   Pulmonary:     Effort: Pulmonary effort is normal. No respiratory distress.     Breath sounds: Normal breath sounds. No wheezing.  Abdominal:     General: Bowel sounds are normal. There is no distension.     Palpations: Abdomen is soft.     Tenderness: There is no abdominal tenderness.  Musculoskeletal:        General: Normal range of motion.     Cervical back: Normal range of motion and neck supple.  Skin:    General: Skin is warm and dry.  Neurological:     Mental Status: She is alert and oriented to person, place, and time.     ED Results / Procedures / Treatments   Labs (all labs ordered are listed, but only abnormal results are displayed) Labs Reviewed  COMPREHENSIVE METABOLIC PANEL - Abnormal; Notable for the following components:      Result Value   Glucose, Bld 109 (*)  All other components within normal limits  LIPASE, BLOOD  CBC  URINALYSIS, ROUTINE W REFLEX MICROSCOPIC  I-STAT BETA HCG BLOOD, ED ([facility], [facility], [facility] ONLY)    EKG EKG Interpretation  Date/Time:  Friday June 15 2020 08:13:27 EDT Ventricular Rate:  80 PR Interval:  166 QRS Duration: 88 QT Interval:  388 QTC Calculation: 447 R Axis:   5 Text Interpretation: Normal sinus rhythm Possible Anterior infarct , age undetermined Abnormal  ECG No significant change since 04/29/2019 Confirmed by Veryl Speak [phone]) on 06/15/2020 4:00:33 PM   Radiology DG Chest 2 View  Result Date: 06/15/2020 CLINICAL DATA:  Patient c/o abdominal pain, chest pains and tightness. EXAM: CHEST - 2 VIEW COMPARISON:  Chest radiograph 04/27/2019 FINDINGS: The heart size and mediastinal contours are within normal limits. The lungs are clear. No pneumothorax or pleural effusion. The visualized skeletal structures are unremarkable. IMPRESSION: No acute cardiopulmonary process. Electronically Signed   By: Audie Pinto M.D.   On: 06/15/2020 10:10    Procedures Procedures (including critical care time)  Medications Ordered in ED Medications  sodium chloride flush (NS) 0.9 % injection 3 mL (has no administration in time range)  morphine 4 MG/ML injection 4 mg (has no administration in time range)  ondansetron (ZOFRAN) injection 4 mg (has no administration in time range)    ED Course  I have reviewed the triage vital signs and the nursing notes.  Pertinent labs & imaging results that were available during my care of the patient were reviewed by me and considered in my medical decision making (see chart for details).    MDM Rules/Calculators/A&P  Patient presenting here with complaints of pain in her chest and upper abdomen radiating into her back.  This began this morning.  She is not reporting any bowel or bladder issues.  Patient arrives here with stable vital signs.  She is afebrile and there is no hypoxia.  She is tender to the epigastric region and in the mid back to palpation.  Her work-up shows no leukocytosis, normal LFTs and lipase, and CT scan of the chest, abdomen, and pelvis which is negative for PE or other acute intrathoracic or intra-abdominal process.  I feel as though emergent causes of her pain have been ruled out.  Her symptoms are most likely musculoskeletal in nature.  Patient to be treated with tramadol and is to return as  needed if her symptoms worsen or change.  Final Clinical Impression(s) / ED Diagnoses Final diagnoses:  None    Rx / DC Orders ED Discharge Orders    None       Veryl Speak, MD 06/15/20 2015

## 2020-06-15 NOTE — Discharge Instructions (Addendum)
Continue taking your oxycodone as previously prescribed as needed for pain.  Follow-up with your primary doctor if symptoms or not improving in the next few days.

## 2020-06-28 ENCOUNTER — Ambulatory Visit (AMBULATORY_SURGERY_CENTER): Payer: Self-pay | Admitting: *Deleted

## 2020-06-28 ENCOUNTER — Telehealth: Payer: Self-pay | Admitting: *Deleted

## 2020-06-28 ENCOUNTER — Other Ambulatory Visit: Payer: Self-pay

## 2020-06-28 VITALS — Ht 69.0 in | Wt 190.0 lb

## 2020-06-28 DIAGNOSIS — I509 Heart failure, unspecified: Secondary | ICD-10-CM

## 2020-06-28 DIAGNOSIS — Z01818 Encounter for other preprocedural examination: Secondary | ICD-10-CM

## 2020-06-28 DIAGNOSIS — R1310 Dysphagia, unspecified: Secondary | ICD-10-CM

## 2020-06-28 DIAGNOSIS — R1319 Other dysphagia: Secondary | ICD-10-CM

## 2020-06-28 DIAGNOSIS — K219 Gastro-esophageal reflux disease without esophagitis: Secondary | ICD-10-CM

## 2020-06-28 HISTORY — DX: Heart failure, unspecified: I50.9

## 2020-06-28 NOTE — Telephone Encounter (Signed)
I saw this patient in pre-visit this am.  She was not able to get the Protonix suspension filled as Medicaid denied it, requiring prior authorization.  Apparently she cannot swallow pills. Please advise.

## 2020-06-28 NOTE — Progress Notes (Signed)

## 2020-06-29 NOTE — Telephone Encounter (Signed)
That is unfortunate, I think she had been on it for a while. We can try  Prevacid solutab if she hasn't tried that before, 15mg  BID . If she can't get that, would try omeprazole capsules, 40mg  crack open and ingest contents, twice daily

## 2020-07-02 NOTE — Progress Notes (Deleted)
Cardiology Office Note:    Date:  07/02/2020   ID:  Raven [name], DOB 01/11/1981, MRN [id number]  PCP:  Lin Landsman, MD  Cardiologist:  No primary care provider on file.  Electrophysiologist:  None   Referring MD: Lin Landsman, MD   Chief Complaint: Follow up palpitations  History of Present Illness:    Raven [name] is a 39 y.o. female with a history of ***  PRIOR CV HISTORY: Last echo 01/31/2015: - Left ventricle: The cavity size was normal. There was moderate  concentric hypertrophy. Systolic function was mildly reduced. The  estimated ejection fraction was in the range of 45% to 50%.  Global hypokinesis. The study is not technically sufficient to  allow evaluation of LV diastolic function.  - Left atrium: The atrium was normal in size.  - Tricuspid valve: There was trivial regurgitation.  - Pulmonary arteries: PA peak pressure: 27 mm Hg (S).  - Inferior vena cava: The vessel was normal in size. The  respirophasic diameter changes were in the normal range (>= 50%),  consistent with normal central venous pressure.  Impressions:  - Compared to the prior echo in 2014, LVEF is slightly higher at  45-50% - global hypokinesis.   Prior ischemic evaluation with stress myoview 01/18/2014, per chart review: EF 40-45%, no ischemia.   Past Medical History:  Diagnosis Date  . Abnormal Pap smear   . Achalasia    esphog stretched several times  . Anxiety   . Arthritis   . Asthma    rarely uses inhaler  . Blood transfusion 01/2006   [place]  . Cardiomyopathy ([place])   . Constipation due to opioid therapy   . Depression   . Dysrhythmia   . Fibromyalgia   . GERD (gastroesophageal reflux disease)    no meds - diet controlled  . Heart murmur   . Hiatal hernia   . History of abnormal Pap smear   . History of chlamydia 1990's  . HSV-2 infection   . HTN (hypertension)   . Occipital neuralgia of right side 04/21/2017  . Pneumonia 05/2013  . PVC's (premature  ventricular contractions)   . Shortness of breath dyspnea   . Status post dilation of esophageal narrowing   . Vaginal Pap smear, abnormal   . Vocal cord paralysis     Past Surgical History:  Procedure Laterality Date  . ADENOIDECTOMY  12/2002  . BLADDER SUSPENSION  06/11/2012   Procedure: TRANSVAGINAL TAPE (TVT) PROCEDURE;  Surgeon: Delice Lesch, MD;  Location: [facility];  Service: Gynecology;  Laterality: N/A;  Tension Free Vaginal Tape/cystoscopy  . BREAST SURGERY  07/2001   tumor removed right breast  . CHOLECYSTECTOMY N/A 01/23/2015   Procedure: LAPAROSCOPIC CHOLECYSTECTOMY ;  Surgeon: Fanny Skates, MD;  Location: [facility];  Service: General;  Laterality: N/A;  case booked with cholangiogram but MD did not complete intraoperative cholangiogram   . CYSTOSCOPY  06/11/2012   Procedure: CYSTOSCOPY;  Surgeon: Delice Lesch, MD;  Location: [facility];  Service: Gynecology;  Laterality: N/A;  . DILATION AND CURETTAGE OF UTERUS  1999  . DILATION AND CURETTAGE OF UTERUS  01/2006   retained products  . ESOPHAGEAL MANOMETRY N/A 02/23/2019   Procedure: ESOPHAGEAL MANOMETRY (EM);  Surgeon: Mauri Pole, MD;  [facility];  Service: Endoscopy;  Laterality: N/A;  . ESOPHAGOGASTRODUODENOSCOPY     with esophageal dilation x 3  . FOREIGN BODY REMOVAL  06/11/2012   Procedure: REMOVAL FOREIGN BODY EXTREMITY;  Surgeon:  Delice Lesch, MD;  Location: [facility];  Service: Gynecology;  Laterality: Right;  Removal of Implanon   . LAPAROSCOPIC TUBAL LIGATION  06/11/2012   Procedure: LAPAROSCOPIC TUBAL LIGATION;  Surgeon: Delice Lesch, MD;  Location: [facility];  Service: Gynecology;  Laterality: Bilateral;  Laparoscopic Bilateral Fulgeration  . TONSILLECTOMY    . TUBAL LIGATION      Current Medications: No outpatient medications have been marked as taking for the 07/03/20 encounter (Appointment) with Elouise Munroe, MD.     Allergies:   Dust mite extract, Other, and Pollen extract   Social  History   Socioeconomic History  . Marital status: Single    Spouse name: Not on file  . Number of children: 2  . Years of education: College  . Highest education level: Not on file  Occupational History  . Not on file  Tobacco Use  . Smoking status: Former Smoker    Packs/day: 0.25    Years: 15.00    Pack years: 3.75    Types: Cigarettes    Quit date: 04/29/2019    Years since quitting: 1.1  . Smokeless tobacco: Never Used  . Tobacco comment: stopped smoking 04-29-2019  Vaping Use  . Vaping Use: Never used  Substance and Sexual Activity  . Alcohol use: No    Comment: on occasion  . Drug use: No  . Sexual activity: Yes    Birth control/protection: Surgical  Other Topics Concern  . Not on file  Social History Narrative   Lives with fiance and children   Caffeine use:  Hot ginger tea- daily   Left-handed   Social Determinants of Health   Financial Resource Strain:   . Difficulty of Paying Living Expenses:   Food Insecurity:   . Worried About Charity fundraiser in the Last Year:   . Arboriculturist in the Last Year:   Transportation Needs:   . Film/video editor (Medical):   Marland Kitchen Lack of Transportation (Non-Medical):   Physical Activity:   . Days of Exercise per Week:   . Minutes of Exercise per Session:   Stress:   . Feeling of Stress :   Social Connections:   . Frequency of Communication with Friends and Family:   . Frequency of Social Gatherings with Friends and Family:   . Attends Religious Services:   . Active Member of Clubs or Organizations:   . Attends Archivist Meetings:   Marland Kitchen Marital Status:      Family History: The patient's family history includes Arthritis in her mother and sister; Asthma in her mother and sister; Breast cancer in her mother; Colon cancer in her paternal uncle; Heart attack in her mother; Heart disease in her father; Hypertension in her father and mother; Neuropathy in her mother and sister; Peripheral Artery Disease (age  of onset: 64) in her mother; Stomach cancer in her paternal uncle; Stroke in her mother. There is no history of Esophageal cancer or Rectal cancer.  ROS:   Please see the history of present illness.    All other systems reviewed and are negative.  EKGs/Labs/Other Studies Reviewed:    The following studies were reviewed today:  EKG:  ***  I have independently reviewed the images from CTA chest 06/15/20 - no definite coronary calcifications. No right heart deformity from right hemithorax abnormality.   Recent Labs: 06/15/2020: ALT 27; BUN 9; Creatinine, Ser 0.83; Hemoglobin 13.9; Platelets 239; Potassium 3.6; Sodium 141  Recent Lipid  Panel No results found for: CHOL, TRIG, HDL, CHOLHDL, VLDL, LDLCALC, LDLDIRECT  Physical Exam:    VS:  There were no vitals taken for this visit.    Wt Readings from Last 5 Encounters:  06/28/20 190 lb (86.2 kg)  06/15/20 190 lb (86.2 kg)  05/29/20 198 lb 3.2 oz (89.9 kg)  05/08/20 185 lb (83.9 kg)  09/29/19 201 lb (91.2 kg)     Constitutional: No acute distress Eyes: sclera non-icteric, normal conjunctiva and lids ENMT: normal dentition, moist mucous membranes Cardiovascular: regular rhythm, normal rate, no murmurs. S1 and S2 normal. Radial pulses normal bilaterally. No jugular venous distention.  Respiratory: clear to auscultation bilaterally GI : normal bowel sounds, soft and nontender. No distention.   MSK: extremities warm, well perfused. No edema.  NEURO: grossly nonfocal exam, moves all extremities. PSYCH: alert and oriented x 3, normal mood and affect.   ASSESSMENT:    No diagnosis found. PLAN:    No diagnosis found.  Total time of encounter: *** minutes total time of encounter, including *** minutes spent in face-to-face patient care on the date of this encounter. This time includes coordination of care and counseling regarding above mentioned problem list. Remainder of non-face-to-face time involved reviewing chart documents/testing  relevant to the patient encounter and documentation in the medical record. I have independently reviewed documentation from referring provider.   Cherlynn Kaiser, MD [place]  [facility]    Medication Adjustments/Labs and Tests Ordered: Current medicines are reviewed at length with the patient today.  Concerns regarding medicines are outlined above.  No orders of the defined types were placed in this encounter.  No orders of the defined types were placed in this encounter.   There are no Patient Instructions on file for this visit.

## 2020-07-02 NOTE — Progress Notes (Signed)
Cardiology Office Note:    Date:  05/29/2020   ID:  Raven [name], DOB 1981-09-07, MRN [id number]  PCP:  Lin Landsman, MD  Cardiologist:  No primary care provider on file.  Electrophysiologist:  None   Referring MD: Lin Landsman, MD   Chief Complaint: HTN, palpitations, NICM  History of Present Illness:    Raven [name] is a 39 y.o. female with a history of symptomatic PVCs, anxiety, HTN and nonischemic cardiomyopathy who presents for further evaluation of HTN, palpitations, and follow up of nonischemic cardiomyopathy.  Last seen in cardiology by my partner Dr. Caryl Comes 01/11/2015.   Last echo 01/31/2015: - Left ventricle: The cavity size was normal. There was moderate  concentric hypertrophy. Systolic function was mildly reduced. The  estimated ejection fraction was in the range of 45% to 50%.  Global hypokinesis. The study is not technically sufficient to  allow evaluation of LV diastolic function.  - Left atrium: The atrium was normal in size.  - Tricuspid valve: There was trivial regurgitation.  - Pulmonary arteries: PA peak pressure: 27 mm Hg (S).  - Inferior vena cava: The vessel was normal in size. The  respirophasic diameter changes were in the normal range (>= 50%),  consistent with normal central venous pressure.  Impressions:  - Compared to the prior echo in 2014, LVEF is slightly higher at  45-50% - global hypokinesis.   Prior ischemic evaluation with stress myoview 01/18/2014, per chart review: EF 40-45%, no ischemia.  She describes frequent palpitations. She is concerned given her history of reduced EF. They can occur with rest or with activity. She describes it as a fluttering. She has been noted to have symptomatic PVCs on monitor and was previoulsy on a beta blocker which she has since stopped. Denies SOB, current chest pain, syncope, lightheadedness, nausea, vomting.  Past Medical History:  Diagnosis Date  . Abnormal Pap smear   . Achalasia     esphog stretched several times  . Anxiety   . Arthritis   . Asthma    rarely uses inhaler  . Blood transfusion 01/2006   [place]  . Cardiomyopathy ([place])   . Constipation due to opioid therapy   . Depression   . Dysrhythmia   . Fibromyalgia   . GERD (gastroesophageal reflux disease)    no meds - diet controlled  . Heart murmur   . Hiatal hernia   . History of abnormal Pap smear   . History of chlamydia 1990's  . HSV-2 infection   . HTN (hypertension)   . Occipital neuralgia of right side 04/21/2017  . Pneumonia 05/2013  . PVC's (premature ventricular contractions)   . Shortness of breath dyspnea   . Status post dilation of esophageal narrowing   . Vaginal Pap smear, abnormal   . Vocal cord paralysis     Past Surgical History:  Procedure Laterality Date  . ADENOIDECTOMY  12/2002  . BLADDER SUSPENSION  06/11/2012   Procedure: TRANSVAGINAL TAPE (TVT) PROCEDURE;  Surgeon: Delice Lesch, MD;  Location: [facility];  Service: Gynecology;  Laterality: N/A;  Tension Free Vaginal Tape/cystoscopy  . BREAST SURGERY  07/2001   tumor removed right breast  . CHOLECYSTECTOMY N/A 01/23/2015   Procedure: LAPAROSCOPIC CHOLECYSTECTOMY ;  Surgeon: Fanny Skates, MD;  Location: [facility];  Service: General;  Laterality: N/A;  case booked with cholangiogram but MD did not complete intraoperative cholangiogram   . CYSTOSCOPY  06/11/2012   Procedure: CYSTOSCOPY;  Surgeon: Delice Lesch, MD;  Location: [facility];  Service: Gynecology;  Laterality: N/A;  . DILATION AND CURETTAGE OF UTERUS  1999  . DILATION AND CURETTAGE OF UTERUS  01/2006   retained products  . ESOPHAGEAL MANOMETRY N/A 02/23/2019   Procedure: ESOPHAGEAL MANOMETRY (EM);  Surgeon: Mauri Pole, MD;  Location: [facility];  Service: Endoscopy;  Laterality: N/A;  . ESOPHAGOGASTRODUODENOSCOPY     with esophageal dilation x 3  . FOREIGN BODY REMOVAL  06/11/2012   Procedure: REMOVAL FOREIGN BODY EXTREMITY;  Surgeon: Delice Lesch, MD;  Location:  [facility];  Service: Gynecology;  Laterality: Right;  Removal of Implanon   . LAPAROSCOPIC TUBAL LIGATION  06/11/2012   Procedure: LAPAROSCOPIC TUBAL LIGATION;  Surgeon: Delice Lesch, MD;  Location: [facility];  Service: Gynecology;  Laterality: Bilateral;  Laparoscopic Bilateral Fulgeration  . TONSILLECTOMY    . TUBAL LIGATION      Current Medications: Current Meds  Medication Sig  . busPIRone (BUSPAR) 7.5 MG tablet Take 1 tablet by mouth daily.  . clonazePAM (KLONOPIN) 2 MG tablet Take 1 tablet (2 mg total) by mouth every 8 (eight) hours.  . diclofenac (CATAFLAM) 50 MG tablet Take 1 tablet (50 mg total) by mouth 2 (two) times daily as needed.  . diclofenac (VOLTAREN) 50 MG EC tablet Take 1 tablet (50 mg total) by mouth 2 (two) times daily.  Marland Kitchen dicyclomine (BENTYL) 10 MG capsule Take 1-2 every 8 hours as needed (Patient taking differently: Take 10 mg by mouth in the morning and at bedtime. )  . FLUoxetine (PROZAC) 20 MG/5ML solution Take 10 mg by mouth daily.   . hydrOXYzine (ATARAX/VISTARIL) 50 MG tablet Take 50 mg by mouth every 6 (six) hours as needed for anxiety.   Marland Kitchen LATUDA 60 MG TABS Take 60 mg by mouth daily.   Marland Kitchen lidocaine (XYLOCAINE) 2 % solution Use as directed 15 mLs in the mouth or throat every 4 (four) hours as needed for mouth pain. Swish and spit  . linaclotide (LINZESS) 290 MCG CAPS capsule Take 1 capsule (290 mcg total) by mouth daily before breakfast.  . oxyCODONE-acetaminophen (PERCOCET) 10-325 MG tablet Take 1 tablet by mouth 4 (four) times daily.   . pantoprazole sodium (PROTONIX) 40 mg/20 mL PACK Take 20 mLs (40 mg total) by mouth daily. (Patient not taking: Reported on 06/15/2020)  . traZODone (DESYREL) 50 MG tablet Take 50 mg by mouth at bedtime.   . [DISCONTINUED] atenolol (TENORMIN) 25 MG tablet Take 1 tablet by mouth daily.     Allergies:   Dust mite extract, Other, and Pollen extract   Social History   Socioeconomic History  . Marital status: Single    Spouse  name: Not on file  . Number of children: 2  . Years of education: College  . Highest education level: Not on file  Occupational History  . Not on file  Tobacco Use  . Smoking status: Former Smoker    Packs/day: 0.25    Years: 15.00    Pack years: 3.75    Types: Cigarettes    Quit date: 04/29/2019    Years since quitting: 1.1  . Smokeless tobacco: Never Used  . Tobacco comment: stopped smoking 04-29-2019  Vaping Use  . Vaping Use: Never used  Substance and Sexual Activity  . Alcohol use: No    Comment: on occasion  . Drug use: No  . Sexual activity: Yes    Birth control/protection: Surgical  Other Topics Concern  . Not on file  Social History Narrative   Lives with fiance and children   Caffeine use:  Hot ginger tea- daily   Left-handed   Social Determinants of Health   Financial Resource Strain:   . Difficulty of Paying Living Expenses:   Food Insecurity:   . Worried About Charity fundraiser in the Last Year:   . Arboriculturist in the Last Year:   Transportation Needs:   . Film/video editor (Medical):   Marland Kitchen Lack of Transportation (Non-Medical):   Physical Activity:   . Days of Exercise per Week:   . Minutes of Exercise per Session:   Stress:   . Feeling of Stress :   Social Connections:   . Frequency of Communication with Friends and Family:   . Frequency of Social Gatherings with Friends and Family:   . Attends Religious Services:   . Active Member of Clubs or Organizations:   . Attends Archivist Meetings:   Marland Kitchen Marital Status:      Family History: The patient's family history includes Arthritis in her mother and sister; Asthma in her mother and sister; Breast cancer in her mother; Colon cancer in her paternal uncle; Heart attack in her mother; Heart disease in her father; Hypertension in her father and mother; Neuropathy in her mother and sister; Peripheral Artery Disease (age of onset: 31) in her mother; Stomach cancer in her paternal uncle; Stroke  in her mother. There is no history of Esophageal cancer or Rectal cancer.  ROS:   Please see the history of present illness.    All other systems reviewed and are negative.  EKGs/Labs/Other Studies Reviewed:    The following studies were reviewed today:  EKG:  NSR, poor r wave progression  Recent Labs: 06/15/2020: ALT 27; BUN 9; Creatinine, Ser 0.83; Hemoglobin 13.9; Platelets 239; Potassium 3.6; Sodium 141  Recent Lipid Panel No results found for: CHOL, TRIG, HDL, CHOLHDL, VLDL, LDLCALC, LDLDIRECT  Physical Exam:    VS:  BP 124/78   Pulse 92   Ht 5\' 9"  (1.753 m)   Wt 198 lb 3.2 oz (89.9 kg)   BMI 29.27 kg/m     Wt Readings from Last 5 Encounters:  06/28/20 190 lb (86.2 kg)  06/15/20 190 lb (86.2 kg)  05/29/20 198 lb 3.2 oz (89.9 kg)  05/08/20 185 lb (83.9 kg)  09/29/19 201 lb (91.2 kg)     Constitutional: No acute distress Eyes: sclera non-icteric, normal conjunctiva and lids ENMT: normal dentition, moist mucous membranes Cardiovascular: regular rhythm, normal rate, no murmurs. S1 and S2 normal. Radial pulses normal bilaterally. No jugular venous distention.  Respiratory: clear to auscultation bilaterally GI : normal bowel sounds, soft and nontender. No distention.   MSK: extremities warm, well perfused. No edema.  NEURO: grossly nonfocal exam, moves all extremities. PSYCH: alert and oriented x 3, normal mood and affect.   ASSESSMENT:    1. NICM (nonischemic cardiomyopathy) ([place])   2. PVC's (premature ventricular contractions)    PLAN:    NICM (nonischemic cardiomyopathy) ([place]) - Plan: EKG 12-Lead, ECHOCARDIOGRAM COMPLETE PVC's (premature ventricular contractions) Palpitations Her palpitations are likely her known symptomatic PVCs. Given time since last workup, would be prudent to update echocardiogram and obtain repeat monitor to quantitate PVCs. No current concern for ischemia, previously normal myoview. - plan for Echocardiogram to evaluate current EF and  structural causes of palpitations. - plan for cardiac monitor for 3 days given frequency of symptoms.  - consider adding metoprolol back  which she has stopped for symptom control if well tolerated from prior trial.   Essential hypertension - previously on losartan for cardiomyopathy, no longer taking. Currently normotensive. Observe.   Cherlynn Kaiser, MD [place]  [facility]    Medication Adjustments/Labs and Tests Ordered: Current medicines are reviewed at length with the patient today.  Concerns regarding medicines are outlined above.  Orders Placed This Encounter  Procedures  . CARDIAC EVENT MONITOR  . EKG 12-Lead  . ECHOCARDIOGRAM COMPLETE   No orders of the defined types were placed in this encounter.   Patient Instructions  Medication Instructions:  Your physician recommends that you continue on your current medications as directed. Please refer to the Current Medication list given to you today.  *If you need a refill on your cardiac medications before your next appointment, please call your pharmacy*  Testing/Procedures: Your physician has requested that you have an echocardiogram. Echocardiography is a painless test that uses sound waves to create images of your heart. It provides your doctor with information about the size and shape of your heart and how well your heart's chambers and valves are working. This procedure takes approximately one hour. There are no restrictions for this procedure. This will be done at our [facility] location:  [place] has recommended that you wear an event monitor (3 day). Event monitors are medical devices that record the heart's electrical activity. Doctors most often [place] these monitors to diagnose arrhythmias. Arrhythmias are problems with the speed or rhythm of the heartbeat. The monitor is a small, portable device. You can wear one while you do your normal daily activities. This is usually  used to diagnose what is causing palpitations/syncope (passing out).  Follow-Up: At [facility], you and your health needs are our priority.  As part of our continuing mission to provide you with exceptional heart care, we have created designated Provider Care Teams.  These Care Teams include your primary Cardiologist (physician) and Advanced Practice Providers (APPs -  Physician Assistants and Nurse Practitioners) who all work together to provide you with the care you need, when you need it.  We recommend signing up for the patient portal called "MyChart".  Sign up information is provided on this After Visit Summary.  MyChart is used to connect with patients for Virtual Visits (Telemedicine).  Patients are able to view lab/test results, encounter notes, upcoming appointments, etc.  Non-urgent messages can be sent to your provider as well.   To learn more about what you can do with MyChart, go to [email or web address].    Your next appointment:   1 month(s)  The format for your next appointment:   In Person  Provider:   Cherlynn Kaiser, MD

## 2020-07-03 ENCOUNTER — Ambulatory Visit: Payer: Medicaid Other | Admitting: Internal Medicine

## 2020-07-05 ENCOUNTER — Ambulatory Visit (HOSPITAL_COMMUNITY): Payer: Medicaid Other | Attending: Cardiology

## 2020-07-05 ENCOUNTER — Other Ambulatory Visit: Payer: Self-pay

## 2020-07-05 DIAGNOSIS — I428 Other cardiomyopathies: Secondary | ICD-10-CM | POA: Insufficient documentation

## 2020-07-05 DIAGNOSIS — I493 Ventricular premature depolarization: Secondary | ICD-10-CM | POA: Insufficient documentation

## 2020-07-10 ENCOUNTER — Other Ambulatory Visit: Payer: Self-pay | Admitting: Gastroenterology

## 2020-07-10 ENCOUNTER — Ambulatory Visit (INDEPENDENT_AMBULATORY_CARE_PROVIDER_SITE_OTHER): Payer: Medicaid Other

## 2020-07-10 ENCOUNTER — Telehealth: Payer: Self-pay

## 2020-07-10 DIAGNOSIS — Z1159 Encounter for screening for other viral diseases: Secondary | ICD-10-CM

## 2020-07-10 LAB — SARS CORONAVIRUS 2 (TAT 6-24 HRS): SARS Coronavirus 2: NEGATIVE

## 2020-07-10 NOTE — Telephone Encounter (Signed)
-----   Message from Yetta Flock, MD sent at 07/10/2020 12:06 PM EDT ----- Regarding: RE: [place] pt Thanks John. Jan I am also confused. You are correct, I don't see anywhere in her chart where she was scheduled for an EGD, not sure who scheduled this. No plans for EGD at this time. Thanks Dr. Loni Muse ----- Message ----- From: Roetta Sessions, CMA Sent: 07/10/2020  10:53 AM EDT To: Yetta Flock, MD Subject: [place] Alar: [facility] pt                                     Dr. Havery Moros - I'm confused.  You saw her in the office in May and she was going to have speech pathology and follow as needed.  Does she need to be scheduled at the hospital at this time?   Thx,  Jan    ----- Message ----- From: Osvaldo Angst, CRNA Sent: 07/10/2020  10:30 AM EDT To: Yetta Flock, MD, Roetta Sessions, CMA Subject: [facility] pt                                         Dr. Havery Moros,  This pt is scheduled with you on 7/16 and  has cardiomyopathy.  Her cardiac echo on 07/05/20 indicated an EF of 30-35%; consequently she does not qualify for care at [facility].  Thanks much,  Osvaldo Angst

## 2020-07-10 NOTE — Telephone Encounter (Signed)
Called and spoke to patient.  She had called back in after completing speech therapy and requested to be scheduled with [facility] for an EGD with Dilation. Someone in scheduling scheduled her for Friday and she had her Covid test this morning.  I let her know that b/c her EF is less than 35% she cannot be scoped in the [place]. She expressed understanding and wants to be scheduled at the hospital as soon as possible. Will discuss possible dates with Dr. Havery Moros and call her back when I have more information.  She agreed

## 2020-07-10 NOTE — Telephone Encounter (Signed)
Recall EGD in the [place] on 07-13-20 has been cancelled. Unable to reach pt by phone; Sent a MyChart message to pt.

## 2020-07-13 ENCOUNTER — Encounter: Payer: Medicaid Other | Admitting: Gastroenterology

## 2020-07-17 ENCOUNTER — Other Ambulatory Visit: Payer: Self-pay

## 2020-07-17 ENCOUNTER — Telehealth: Payer: Self-pay

## 2020-07-17 DIAGNOSIS — R0989 Other specified symptoms and signs involving the circulatory and respiratory systems: Secondary | ICD-10-CM

## 2020-07-17 DIAGNOSIS — R490 Dysphonia: Secondary | ICD-10-CM

## 2020-07-17 DIAGNOSIS — R1319 Other dysphagia: Secondary | ICD-10-CM

## 2020-07-17 DIAGNOSIS — R1310 Dysphagia, unspecified: Secondary | ICD-10-CM

## 2020-07-17 DIAGNOSIS — R09A2 Foreign body sensation, throat: Secondary | ICD-10-CM

## 2020-07-17 DIAGNOSIS — K219 Gastro-esophageal reflux disease without esophagitis: Secondary | ICD-10-CM

## 2020-07-17 NOTE — Telephone Encounter (Signed)
Called and spoke to pt.  She is very interested in EGD with dilation at [facility] on 09-25-20 with [facility]. She understands she will need to be Covid tested on Friday, 9-24. Scheduled for 12:10 pm.  We scheduled her for a nurse visit to be instructed on Tuesday, 09-11-20 at 10:00am. Patient will need to arrive at [facility] at 7:00am on Tuesday, 9-28.  She expressed understanding.

## 2020-07-17 NOTE — Telephone Encounter (Signed)
Pt is checking on the status of previous message, wanting an update

## 2020-07-17 NOTE — Telephone Encounter (Signed)
Called and spoke to pt.  Scheduled her for an EGD w/ dil at [facility] on 09-25-20 at 8:30am to arrive at 7:00am.

## 2020-07-17 NOTE — Progress Notes (Signed)
Patient scheduled for EGD with dilation at [facility] on 09-25-20 at 8:30 am.

## 2020-07-26 ENCOUNTER — Encounter: Payer: Self-pay | Admitting: Internal Medicine

## 2020-07-26 ENCOUNTER — Telehealth: Payer: Self-pay | Admitting: Internal Medicine

## 2020-07-26 ENCOUNTER — Telehealth: Payer: Self-pay | Admitting: *Deleted

## 2020-07-26 ENCOUNTER — Telehealth (INDEPENDENT_AMBULATORY_CARE_PROVIDER_SITE_OTHER): Payer: Medicaid Other | Admitting: Internal Medicine

## 2020-07-26 VITALS — Ht 69.0 in | Wt 190.0 lb

## 2020-07-26 DIAGNOSIS — I10 Essential (primary) hypertension: Secondary | ICD-10-CM | POA: Diagnosis not present

## 2020-07-26 DIAGNOSIS — R002 Palpitations: Secondary | ICD-10-CM | POA: Diagnosis not present

## 2020-07-26 DIAGNOSIS — I428 Other cardiomyopathies: Secondary | ICD-10-CM

## 2020-07-26 DIAGNOSIS — I493 Ventricular premature depolarization: Secondary | ICD-10-CM | POA: Diagnosis not present

## 2020-07-26 DIAGNOSIS — Z79899 Other long term (current) drug therapy: Secondary | ICD-10-CM

## 2020-07-26 MED ORDER — FUROSEMIDE 40 MG PO TABS
40.0000 mg | ORAL_TABLET | Freq: Every day | ORAL | 3 refills | Status: DC
Start: 2020-07-26 — End: 2023-06-09

## 2020-07-26 MED ORDER — METOPROLOL SUCCINATE ER 25 MG PO TB24
25.0000 mg | ORAL_TABLET | Freq: Every day | ORAL | 3 refills | Status: DC
Start: 2020-07-26 — End: 2020-07-27

## 2020-07-26 NOTE — Progress Notes (Signed)
Virtual Visit via Telephone Note   This visit type was conducted due to national recommendations for restrictions regarding the COVID-19 Pandemic (e.g. social distancing) in an effort to limit this patient's exposure and mitigate transmission in our community.  Due to her co-morbid illnesses, this patient is at least at moderate risk for complications without adequate follow up.  This format is felt to be most appropriate for this patient at this time.  The patient did not have access to video technology/had technical difficulties with video requiring transitioning to audio format only (telephone).  All issues noted in this document were discussed and addressed.  No physical exam could be performed with this format.  Please refer to the patient's chart for her  consent to telehealth for [facility].    Date:  07/26/2020   ID:  Raven [name], DOB 11-05-1981, MRN [id number] The patient was identified using 2 identifiers.  Patient Location: Home Provider Location: Home Office  PCP:  Raven Landsman, MD  Cardiologist:  No primary care provider on file.  Electrophysiologist:  None   Evaluation Performed:  Follow-Up Visit  Chief Complaint: NICM, HTN, palpitations  History of Present Illness:    Raven [name] is a 39 y.o. female with symptomatic PVCs, anxiety, HTN and nonischemic cardiomyopathy who presents for further evaluation of HTN, palpitations, and follow up of nonischemic cardiomyopathy.   Unable to complete full monitor due to adhesive reaction. However, approximately 6 days of data suggests symptomatic PVCs, consistent with prior evaluations.   EF 30-35%. Reviewed, with low burden of PVCs, unlikely to be PVC mediated. Consistent with prior dx of NICM. Mild LE edema. No CP.   The patient does not have symptoms concerning for COVID-19 infection (fever, chills, cough, or new shortness of breath).    Past Medical History:  Diagnosis Date  . Abnormal Pap smear   . Achalasia      esphog stretched several times  . Anxiety   . Arthritis   . Asthma    rarely uses inhaler  . Blood transfusion 01/2006   Raven [name]  . Cardiomyopathy ([place])   . Cardiomyopathy, idiopathic ([place]) 01/09/2014  . Chronically on opiate therapy 01/18/2018  . Constipation due to opioid therapy   . Depression   . DEPRESSION 10/09/2008   Qualifier: Diagnosis of  By: Tilden Dome    . Dysphagia 08/10/2009   Qualifier: Diagnosis of  By: Shane Crutch, Amy S   . Dysrhythmia   . ESOPHAGEAL STRICTURE 08/10/2009   Qualifier: Diagnosis of  By: Trellis Paganini PA-c, Amy S   . Fibromyalgia   . Gallstones and inflammation of gallbladder without obstruction 01/23/2015  . GERD (gastroesophageal reflux disease)    no meds - diet controlled  . 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 Heart murmur   . Heart palpitations    tx with atenolol by steven klein   . Hiatal hernia   . History of abnormal Pap smear   . History of chlamydia 1990's  . 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  . HSV-2 infection   . HTN (hypertension)   . Occipital neuralgia of right side 04/21/2017  . Palpitation 04/11/2011  . Pneumonia 05/2013  . PVC's (premature ventricular contractions)   . Shortness of breath dyspnea   . 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 Status post dilation of esophageal narrowing   . UTI (urinary tract infection) 02/12/2017   E Coli Rx Septra DS Unable to  reach patient, busy signal  . Vaginal Pap smear, abnormal   . Vocal cord paralysis    Past Surgical History:  Procedure Laterality Date  . ADENOIDECTOMY  12/2002  . BLADDER SUSPENSION  06/11/2012   Procedure: TRANSVAGINAL TAPE (TVT) PROCEDURE;  Surgeon: Delice Lesch, MD;  Location: [facility];  Service: Gynecology;  Laterality: N/A;  Tension Free Vaginal Tape/cystoscopy  . BREAST SURGERY  07/2001   tumor removed right breast  . CHOLECYSTECTOMY N/A 01/23/2015   Procedure: LAPAROSCOPIC CHOLECYSTECTOMY ;  Surgeon:  Fanny Skates, MD;  Location: [facility];  Service: General;  Laterality: N/A;  case booked with cholangiogram but MD did not complete intraoperative cholangiogram   . CYSTOSCOPY  06/11/2012   Procedure: CYSTOSCOPY;  Surgeon: Delice Lesch, MD;  Location: [facility];  Service: Gynecology;  Laterality: N/A;  . DILATION AND CURETTAGE OF UTERUS  1999  . DILATION AND CURETTAGE OF UTERUS  01/2006   retained products  . ESOPHAGEAL MANOMETRY N/A 02/23/2019   Procedure: ESOPHAGEAL MANOMETRY (EM);  Surgeon: Mauri Pole, MD;  Location: [facility];  Service: Endoscopy;  Laterality: N/A;  . ESOPHAGOGASTRODUODENOSCOPY     with esophageal dilation x 3  . FOREIGN BODY REMOVAL  06/11/2012   Procedure: REMOVAL FOREIGN BODY EXTREMITY;  Surgeon: Delice Lesch, MD;  Location: [facility];  Service: Gynecology;  Laterality: Right;  Removal of Implanon   . LAPAROSCOPIC TUBAL LIGATION  06/11/2012   Procedure: LAPAROSCOPIC TUBAL LIGATION;  Surgeon: Delice Lesch, MD;  Location: [facility];  Service: Gynecology;  Laterality: Bilateral;  Laparoscopic Bilateral Fulgeration  . TONSILLECTOMY    . TUBAL LIGATION       Current Meds  Medication Sig  . busPIRone (BUSPAR) 7.5 MG tablet Take 1 tablet by mouth daily.  . diclofenac (CATAFLAM) 50 MG tablet Take 1 tablet (50 mg total) by mouth 2 (two) times daily as needed.  . diclofenac (VOLTAREN) 50 MG EC tablet Take 1 tablet (50 mg total) by mouth 2 (two) times daily.  Marland Kitchen dicyclomine (BENTYL) 10 MG capsule Take 1-2 every 8 hours as needed (Patient taking differently: Take 10 mg by mouth in the morning and at bedtime. )  . FLUoxetine (PROZAC) 20 MG/5ML solution Take 10 mg by mouth daily.   . hydrOXYzine (ATARAX/VISTARIL) 50 MG tablet Take 50 mg by mouth every 6 (six) hours as needed for anxiety.   Marland Kitchen LATUDA 60 MG TABS Take 60 mg by mouth daily.   Marland Kitchen lidocaine (XYLOCAINE) 2 % solution Use as directed 15 mLs in the mouth or throat every 4 (four) hours as needed for mouth pain. Swish  and spit  . linaclotide (LINZESS) 290 MCG CAPS capsule Take 1 capsule (290 mcg total) by mouth daily before breakfast.  . LORazepam (ATIVAN) 2 MG tablet Take 2 mg by mouth 3 (three) times daily as needed for anxiety or sleep.  Marland Kitchen oxyCODONE-acetaminophen (PERCOCET) 10-325 MG tablet Take 1 tablet by mouth 4 (four) times daily.   . pantoprazole sodium (PROTONIX) 40 mg/20 mL PACK Take 20 mLs (40 mg total) by mouth daily.  . traZODone (DESYREL) 50 MG tablet Take 50 mg by mouth at bedtime.      Allergies:   Dust mite extract, Other, and Pollen extract   Social History   Tobacco Use  . Smoking status: Former Smoker    Packs/day: 0.25    Years: 15.00    Pack years: 3.75    Types: Cigarettes    Quit date: 04/29/2019  Years since quitting: 1.2  . Smokeless tobacco: Never Used  . Tobacco comment: stopped smoking 04-29-2019  Vaping Use  . Vaping Use: Never used  Substance Use Topics  . Alcohol use: No    Comment: on occasion  . Drug use: No     Family Hx: The patient's family history includes Arthritis in her mother and sister; Asthma in her mother and sister; Breast cancer in her mother; Colon cancer in her paternal uncle; Heart attack in her mother; Heart disease in her father; Hypertension in her father and mother; Neuropathy in her mother and sister; Peripheral Artery Disease (age of onset: 67) in her mother; Stomach cancer in her paternal uncle; Stroke in her mother. There is no history of Esophageal cancer or Rectal cancer.  ROS:   Please see the history of present illness.     All other systems reviewed and are negative.   Prior CV studies:   The following studies were reviewed today:    Labs/Other Tests and Data Reviewed:    EKG:  No ECG reviewed.  Recent Labs: 06/15/2020: ALT 27; BUN 9; Creatinine, Ser 0.83; Hemoglobin 13.9; Platelets 239; Potassium 3.6; Sodium 141   Recent Lipid Panel No results found for: CHOL, TRIG, HDL, CHOLHDL, LDLCALC, LDLDIRECT  Wt Readings from  Last 3 Encounters:  07/26/20 190 lb (86.2 kg)  06/28/20 190 lb (86.2 kg)  06/15/20 190 lb (86.2 kg)     Objective:    Vital Signs:  Ht 5\' 9"  (1.753 m)   Wt 190 lb (86.2 kg)   BMI 28.06 kg/m    VITAL SIGNS:  reviewed GEN:  no acute distress RESPIRATORY:  normal respiratory effort, no increased work of breathing NEURO:  alert and oriented x 3, speech normal PSYCH:  normal affect   ASSESSMENT & PLAN:    1. PVC's (premature ventricular contractions)   2. Symptomatic PVCs   3. Palpitations   4. Essential hypertension   5. NICM (nonischemic cardiomyopathy) ([place])   6. Medication management    Will start metoprolol succinate 25 mg daily for both NICM as well as symptomatic palpitations. Heart Failure Therapy ACE-I/ARB/ARNI: start at follow up if tolerated from BP BB: starting metoprolol succinate 25 mg daily  MRA: none, consider starting SGLT2I: none currently, consider starting Diuretic plan: will start lasix 40 mg daily and recheck labs in 7-10 days. Follow up close for HF med titration.  ADDENDUM: patient unable swallow metoprolol succinate tab, will change to bisoprolol 2.5 mg daily.   COVID-19 Education: The signs and symptoms of COVID-19 were discussed with the patient and how to seek care for testing (follow up with PCP or arrange E-visit).  The importance of social distancing was discussed today.  Time:   Today, I have spent 20 minutes with the patient with telehealth technology discussing the above problems.     Medication Adjustments/Labs and Tests Ordered: Current medicines are reviewed at length with the patient today.  Concerns regarding medicines are outlined above.   Patient Instructions  Medication Instructions:  START Furosemide 40 mg once daily START Metoprolol Succinate 25 mg once daily  *If you need a refill on your cardiac medications before your next appointment, please call your pharmacy*   Lab Work: Your provider would like for you to return  in 7-10 days to have the following labs drawn: BMET and Magnesium. You do not need an appointment for the lab. Once in our office lobby there is a podium where you can sign in and  ring the doorbell to alert [place] that you are here. The lab is open from 8:00 am to 4:30 pm; closed for lunch from 12:45pm-1:45pm.  If you have labs (blood work) drawn today and your tests are completely normal, you will receive your results only by: Marland Kitchen MyChart Message (if you have MyChart) OR . A paper copy in the mail If you have any lab test that is abnormal or we need to change your treatment, we will call you to review the results.   Testing/Procedures: None ordered   Follow-Up: At [facility], you and your health needs are our priority.  As part of our continuing mission to provide you with exceptional heart care, we have created designated Provider Care Teams.  These Care Teams include your primary Cardiologist (physician) and Advanced Practice Providers (APPs -  Physician Assistants and Nurse Practitioners) who all work together to provide you with the care you need, when you need it.  We recommend signing up for the patient portal called "MyChart".  Sign up information is provided on this After Visit Summary.  MyChart is used to connect with patients for Virtual Visits (Telemedicine).  Patients are able to view lab/test results, encounter notes, upcoming appointments, etc.  Non-urgent messages can be sent to your provider as well.   To learn more about what you can do with MyChart, go to [email or web address].    Your next appointment:   2 week(s)  The format for your next appointment:   In Person  Provider:    Rosaria Ferries, PA-C  Jory Sims, DNP, ANP  Cadence Kathlen Mody, PA-C     Signed, Elouise Munroe, MD  07/26/2020 9:03 AM    [place]

## 2020-07-26 NOTE — Telephone Encounter (Signed)
The patient's avs and lab orders has been mailed to the patient from the virtual appointment with Dr. Margaretann Loveless.

## 2020-07-26 NOTE — Patient Instructions (Signed)
Medication Instructions:  START Furosemide 40 mg once daily START Metoprolol Succinate 25 mg once daily  *If you need a refill on your cardiac medications before your next appointment, please call your pharmacy*   Lab Work: Your provider would like for you to return in 7-10 days to have the following labs drawn: BMET and Magnesium. You do not need an appointment for the lab. Once in our office lobby there is a podium where you can sign in and ring the doorbell to alert [place] that you are here. The lab is open from 8:00 am to 4:30 pm; closed for lunch from 12:45pm-1:45pm.  If you have labs (blood work) drawn today and your tests are completely normal, you will receive your results only by: Marland Kitchen MyChart Message (if you have MyChart) OR . A paper copy in the mail If you have any lab test that is abnormal or we need to change your treatment, we will call you to review the results.   Testing/Procedures: None ordered   Follow-Up: At [facility], you and your health needs are our priority.  As part of our continuing mission to provide you with exceptional heart care, we have created designated Provider Care Teams.  These Care Teams include your primary Cardiologist (physician) and Advanced Practice Providers (APPs -  Physician Assistants and Nurse Practitioners) who all work together to provide you with the care you need, when you need it.  We recommend signing up for the patient portal called "MyChart".  Sign up information is provided on this After Visit Summary.  MyChart is used to connect with patients for Virtual Visits (Telemedicine).  Patients are able to view lab/test results, encounter notes, upcoming appointments, etc.  Non-urgent messages can be sent to your provider as well.   To learn more about what you can do with MyChart, go to [email or web address].    Your next appointment:   2 week(s)  The format for your next appointment:   In Person  Provider:    Rosaria Ferries,  PA-C  Jory Sims, DNP, ANP  Cadence Kathlen Mody, PA-C

## 2020-07-26 NOTE — Telephone Encounter (Signed)
  Patient Consent for Virtual Visit         Raven [name] has provided verbal consent on 07/26/2020 for a virtual visit (video or telephone).   CONSENT FOR VIRTUAL VISIT FOR:  Raven [name]  By participating in this virtual visit I agree to the following:  I hereby voluntarily request, consent and authorize [place] and its employed or contracted physicians, physician assistants, nurse practitioners or other licensed health care professionals (the Practitioner), to provide me with telemedicine health care services (the "Services") as deemed necessary by the treating Practitioner. I acknowledge and consent to receive the Services by the Practitioner via telemedicine. I understand that the telemedicine visit will involve communicating with the Practitioner through live audiovisual communication technology and the disclosure of certain medical information by electronic transmission. I acknowledge that I have been given the opportunity to request an in-person assessment or other available alternative prior to the telemedicine visit and am voluntarily participating in the telemedicine visit.  I understand that I have the right to withhold or withdraw my consent to the use of telemedicine in the course of my care at any time, without affecting my right to future care or treatment, and that the Practitioner or I may terminate the telemedicine visit at any time. I understand that I have the right to inspect all information obtained and/or recorded in the course of the telemedicine visit and may receive copies of available information for a reasonable fee.  I understand that some of the potential risks of receiving the Services via telemedicine include:  Marland Kitchen Delay or interruption in medical evaluation due to technological equipment failure or disruption; . Information transmitted may not be sufficient (e.g. poor resolution of images) to allow for appropriate medical decision making by the  Practitioner; and/or  . In rare instances, security protocols could fail, causing a breach of personal health information.  Furthermore, I acknowledge that it is my responsibility to provide information about my medical history, conditions and care that is complete and accurate to the best of my ability. I acknowledge that Practitioner's advice, recommendations, and/or decision may be based on factors not within their control, such as incomplete or inaccurate data provided by me or distortions of diagnostic images or specimens that may result from electronic transmissions. I understand that the practice of medicine is not an exact science and that Practitioner makes no warranties or guarantees regarding treatment outcomes. I acknowledge that a copy of this consent can be made available to me via my patient portal ([place]), or I can request a printed copy by calling the office of [facility].    I understand that my insurance will be billed for this visit.   I have read or had this consent read to me. . I understand the contents of this consent, which adequately explains the benefits and risks of the Services being provided via telemedicine.  . I have been provided ample opportunity to ask questions regarding this consent and the Services and have had my questions answered to my satisfaction. . I give my informed consent for the services to be provided through the use of telemedicine in my medical care

## 2020-07-26 NOTE — Telephone Encounter (Signed)
The patient has been made aware that we will call her back with a replacement.

## 2020-07-26 NOTE — Telephone Encounter (Signed)
Pt c/o medication issue:  1. Name of Medication: metoprolol succinate (TOPROL-XL) 25 MG 24 hr tablet  2. How are you currently taking this medication (dosage and times per day)? As directed  3. Are you having a reaction (difficulty breathing--STAT)? No   4. What is your medication issue? Patient states she has dysphagia and she is unable to swallow pills. She normal crushes up medication, however, she states metoprolol medication can not be crushed so she is unable to take it. Please call to advise.

## 2020-07-27 MED ORDER — BISOPROLOL FUMARATE 5 MG PO TABS: 2.5000 mg | ORAL_TABLET | Freq: Every day | ORAL | 1 refills | Status: DC

## 2020-07-27 NOTE — Telephone Encounter (Signed)
Per Dr. Delphina Cahill secure chat the patient can try Bisoprolol 2.5 mg once daily  "bisoprolol 2.5 mg daily"  The patient has been made aware and verbalized her understanding.

## 2020-08-17 ENCOUNTER — Ambulatory Visit (INDEPENDENT_AMBULATORY_CARE_PROVIDER_SITE_OTHER): Payer: Medicaid Other | Admitting: Physician Assistant

## 2020-08-17 ENCOUNTER — Encounter: Payer: Self-pay | Admitting: Physician Assistant

## 2020-08-17 VITALS — BP 120/78 | HR 93 | Ht 69.0 in | Wt 186.4 lb

## 2020-08-17 DIAGNOSIS — M79661 Pain in right lower leg: Secondary | ICD-10-CM

## 2020-08-17 DIAGNOSIS — M79662 Pain in left lower leg: Secondary | ICD-10-CM | POA: Diagnosis not present

## 2020-08-17 DIAGNOSIS — I10 Essential (primary) hypertension: Secondary | ICD-10-CM

## 2020-08-17 DIAGNOSIS — I428 Other cardiomyopathies: Secondary | ICD-10-CM

## 2020-08-17 NOTE — Patient Instructions (Signed)
Medication Instructions:  Continue same medications *If you need a refill on your cardiac medications before your next appointment, please call your pharmacy*   Lab Work: Bmet,magnesium today    Testing/Procedures: Schedule ABI's    Follow-Up: At [facility], you and your health needs are our priority.  As part of our continuing mission to provide you with exceptional heart care, we have created designated Provider Care Teams.  These Care Teams include your primary Cardiologist (physician) and Advanced Practice Providers (APPs -  Physician Assistants and Nurse Practitioners) who all work together to provide you with the care you need, when you need it.  We recommend signing up for the patient portal called "MyChart".  Sign up information is provided on this After Visit Summary.  MyChart is used to connect with patients for Virtual Visits (Telemedicine).  Patients are able to view lab/test results, encounter notes, upcoming appointments, etc.  Non-urgent messages can be sent to your provider as well.   To learn more about what you can do with MyChart, go to [email or web address].    Your next appointment:  2 to 3  months   The format for your next appointment: Office     Provider: Dr.Acharya

## 2020-08-17 NOTE — Progress Notes (Signed)
Cardiology Office Note:    Date:  08/19/2020   ID:  Raven [name], DOB [birth year], MRN [id number]  PCP:  Lin Landsman, MD  [facility] Cardiologist:  Elouise Munroe, MD  [facility] Electrophysiologist:  None   Referring MD: Lin Landsman, MD   Chief Complaint  Patient presents with  . Follow-up    seen for Dr. Margarito Liner    History of Present Illness:     Raven [name] is a 39 y.o. female with a hx of symptomatic PVCs, anxiety, hypertension, and nonischemic cardiomyopathy.  Previous echocardiogram obtained in February 2016 showed EF 45 to 50%, moderate LVH.  Myoview in January 2015 showed EF 40 to 45%, no ischemia.  She complained of frequent palpitation during the last office visit with Dr. Margaretann Loveless on 05/29/2020.  Heart monitor and echocardiogram were recommended.  Unfortunately, she had a allergic reaction with prevented heart monitor.  Echocardiogram obtained on 07/05/2020 showed EF down to 30 to 35%, global hypokinesis, grade 1 DD.  She was last seen virtually by Dr. Margaretann Loveless on 07/26/2020, it was recommended she start on metoprolol succinate 25 mg daily and Lasix 40 mg daily.  Due to symptom of dysphagia and inability to swallow metoprolol, this medication was later changed to bisoprolol 2.5 mg daily.   Patient presents today for follow-up.  Since starting on the bisoprolol, she has been doing okay.  She previously had some chest pain and shoulder pain several months ago, this has resolved.  She continued to have leg pain as well, this occurs more so at rest compared to exertion.  I will obtain ABI but suspicion for significant PAD is relatively low.  At first I wanted to increase the bisoprolol, however she appears to have some degree of orthostatic dizziness, I decided to hold off on increasing the heart failure medication at this time.   Past Medical History:  Diagnosis Date  . Abnormal Pap smear   . Achalasia    esphog stretched several times  . Anxiety   . Arthritis   .  Asthma    rarely uses inhaler  . Blood transfusion 01/2006   [place]  . Cardiomyopathy ([place])   . Cardiomyopathy, idiopathic ([place]) 01/09/2014  . Chronically on opiate therapy 01/18/2018  . Constipation due to opioid therapy   . Depression   . DEPRESSION 10/09/2008   Qualifier: Diagnosis of  By: Tilden Dome    . Dysphagia 08/10/2009   Qualifier: Diagnosis of  By: Shane Crutch, Amy S   . Dysrhythmia   . ESOPHAGEAL STRICTURE 08/10/2009   Qualifier: Diagnosis of  By: Trellis Paganini PA-c, Amy S   . Fibromyalgia   . Gallstones and inflammation of gallbladder without obstruction 01/23/2015  . GERD (gastroesophageal reflux disease)    no meds - diet controlled  . 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 Heart murmur   . Heart palpitations    tx with atenolol by steven klein   . Hiatal hernia   . History of abnormal Pap smear   . History of chlamydia 1990's  . 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  . HSV-2 infection   . HTN (hypertension)   . Occipital neuralgia of right side 04/21/2017  . Palpitation 04/11/2011  . Pneumonia 05/2013  . PVC's (premature ventricular contractions)   . Shortness of breath dyspnea   . 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 Status post dilation of esophageal narrowing   . UTI (urinary  tract infection) 02/12/2017   E Coli Rx Septra DS Unable to reach patient, busy signal  . Vaginal Pap smear, abnormal   . Vocal cord paralysis     Past Surgical History:  Procedure Laterality Date  . ADENOIDECTOMY  12/2002  . BLADDER SUSPENSION  06/11/2012   Procedure: TRANSVAGINAL TAPE (TVT) PROCEDURE;  Surgeon: Delice Lesch, MD;  Location: [facility];  Service: Gynecology;  Laterality: N/A;  Tension Free Vaginal Tape/cystoscopy  . BREAST SURGERY  07/2001   tumor removed right breast  . CHOLECYSTECTOMY N/A 01/23/2015   Procedure: LAPAROSCOPIC CHOLECYSTECTOMY ;  Surgeon: Fanny Skates, MD;  Location: [place];  Service: General;  Laterality:  N/A;  case booked with cholangiogram but MD did not complete intraoperative cholangiogram   . CYSTOSCOPY  06/11/2012   Procedure: CYSTOSCOPY;  Surgeon: Delice Lesch, MD;  Location: [facility];  Service: Gynecology;  Laterality: N/A;  . DILATION AND CURETTAGE OF UTERUS  1999  . DILATION AND CURETTAGE OF UTERUS  01/2006   retained products  . ESOPHAGEAL MANOMETRY N/A 02/23/2019   Procedure: ESOPHAGEAL MANOMETRY (EM);  Surgeon: Mauri Pole, MD;  Location: [facility];  Service: Endoscopy;  Laterality: N/A;  . ESOPHAGOGASTRODUODENOSCOPY     with esophageal dilation x 3  . FOREIGN BODY REMOVAL  06/11/2012   Procedure: REMOVAL FOREIGN BODY EXTREMITY;  Surgeon: Delice Lesch, MD;  Location: [facility];  Service: Gynecology;  Laterality: Right;  Removal of Implanon   . LAPAROSCOPIC TUBAL LIGATION  06/11/2012   Procedure: LAPAROSCOPIC TUBAL LIGATION;  Surgeon: Delice Lesch, MD;  Location: [facility];  Service: Gynecology;  Laterality: Bilateral;  Laparoscopic Bilateral Fulgeration  . TONSILLECTOMY    . TUBAL LIGATION      Current Medications: Current Meds  Medication Sig  . bisoprolol (ZEBETA) 5 MG tablet Take 0.5 tablets (2.5 mg total) by mouth daily.  . busPIRone (BUSPAR) 7.5 MG tablet Take 1 tablet by mouth daily.  . diclofenac (CATAFLAM) 50 MG tablet Take 1 tablet (50 mg total) by mouth 2 (two) times daily as needed.  . diclofenac (VOLTAREN) 50 MG EC tablet Take 1 tablet (50 mg total) by mouth 2 (two) times daily.  Marland Kitchen dicyclomine (BENTYL) 10 MG capsule Take 1-2 every 8 hours as needed (Patient taking differently: Take 10 mg by mouth in the morning and at bedtime. )  . FLUoxetine (PROZAC) 20 MG/5ML solution Take 10 mg by mouth daily.   . furosemide (LASIX) 40 MG tablet Take 1 tablet (40 mg total) by mouth daily.  . hydrOXYzine (ATARAX/VISTARIL) 50 MG tablet Take 50 mg by mouth every 6 (six) hours as needed for anxiety.   Marland Kitchen LATUDA 60 MG TABS Take 60 mg by mouth daily.   Marland Kitchen lidocaine  (XYLOCAINE) 2 % solution Use as directed 15 mLs in the mouth or throat every 4 (four) hours as needed for mouth pain. Swish and spit  . linaclotide (LINZESS) 290 MCG CAPS capsule Take 1 capsule (290 mcg total) by mouth daily before breakfast.  . LORazepam (ATIVAN) 2 MG tablet Take 2 mg by mouth 3 (three) times daily as needed for anxiety or sleep.  Marland Kitchen oxyCODONE-acetaminophen (PERCOCET) 10-325 MG tablet Take 1 tablet by mouth 4 (four) times daily.   . pantoprazole sodium (PROTONIX) 40 mg/20 mL PACK Take 20 mLs (40 mg total) by mouth daily.  . traZODone (DESYREL) 50 MG tablet Take 50 mg by mouth at bedtime.      Allergies:   Dust mite extract,  Other, and Pollen extract   Social History   Socioeconomic History  . Marital status: Single    Spouse name: Not on file  . Number of children: 2  . Years of education: College  . Highest education level: Not on file  Occupational History  . Not on file  Tobacco Use  . Smoking status: Former Smoker    Packs/day: 0.25    Years: 15.00    Pack years: 3.75    Types: Cigarettes    Quit date: 04/29/2019    Years since quitting: 1.3  . Smokeless tobacco: Never Used  . Tobacco comment: stopped smoking 04-29-2019  Vaping Use  . Vaping Use: Never used  Substance and Sexual Activity  . Alcohol use: No    Comment: on occasion  . Drug use: No  . Sexual activity: Yes    Birth control/protection: Surgical  Other Topics Concern  . Not on file  Social History Narrative   Lives with fiance and children   Caffeine use:  Hot ginger tea- daily   Left-handed   Social Determinants of Health   Financial Resource Strain:   . Difficulty of Paying Living Expenses: Not on file  Food Insecurity:   . Worried About Charity fundraiser in the Last Year: Not on file  . Ran Out of Food in the Last Year: Not on file  Transportation Needs:   . Lack of Transportation (Medical): Not on file  . Lack of Transportation (Non-Medical): Not on file  Physical Activity:   .  Days of Exercise per Week: Not on file  . Minutes of Exercise per Session: Not on file  Stress:   . Feeling of Stress : Not on file  Social Connections:   . Frequency of Communication with Friends and Family: Not on file  . Frequency of Social Gatherings with Friends and Family: Not on file  . Attends Religious Services: Not on file  . Active Member of Clubs or Organizations: Not on file  . Attends Archivist Meetings: Not on file  . Marital Status: Not on file     Family History: The patient's family history includes Arthritis in her mother and sister; Asthma in her mother and sister; Breast cancer in her mother; Colon cancer in her paternal uncle; Heart attack in her mother; Heart disease in her father; Hypertension in her father and mother; Neuropathy in her mother and sister; Peripheral Artery Disease (age of onset: 75) in her mother; Stomach cancer in her paternal uncle; Stroke in her mother. There is no history of Esophageal cancer or Rectal cancer.  ROS:   Please see the history of present illness.     All other systems reviewed and are negative.  EKGs/Labs/Other Studies Reviewed:    The following studies were reviewed today:  Echo 07/05/2020 1. Left ventricular ejection fraction, by estimation, is 30 to 35%. The  left ventricle has moderate to severely decreased function. The left  ventricle demonstrates global hypokinesis. There is mild left ventricular  hypertrophy. Left ventricular  diastolic parameters are consistent with Grade I diastolic dysfunction  (impaired relaxation).  2. Right ventricular systolic function is normal. The right ventricular  size is normal.  3. The mitral valve is normal in structure. Trivial mitral valve  regurgitation. No evidence of mitral stenosis.  4. The aortic valve is tricuspid. Aortic valve regurgitation is not  visualized. No aortic stenosis is present.  5. The inferior vena cava is normal in size with greater than  50%    respiratory variability, suggesting right atrial pressure of 3 mmHg.   EKG:  EKG is not ordered today.  Recent Labs: 06/15/2020: ALT 27; Hemoglobin 13.9; Platelets 239 08/17/2020: BUN 12; Creatinine, Ser 0.95; Magnesium 2.3; Potassium 4.8; Sodium 142  Recent Lipid Panel No results found for: CHOL, TRIG, HDL, CHOLHDL, VLDL, LDLCALC, LDLDIRECT  Physical Exam:    VS:  BP 120/78   Pulse 93   Ht 5\' 9"  (1.753 m)   Wt 186 lb 6.4 oz (84.6 kg)   BMI 27.53 kg/m     Wt Readings from Last 3 Encounters:  08/17/20 186 lb 6.4 oz (84.6 kg)  07/26/20 190 lb (86.2 kg)  06/28/20 190 lb (86.2 kg)     GEN:  Well nourished, well developed in no acute distress HEENT: Normal NECK: No JVD; No carotid bruits LYMPHATICS: No lymphadenopathy CARDIAC: RRR, no murmurs, rubs, gallops RESPIRATORY:  Clear to auscultation without rales, wheezing or rhonchi  ABDOMEN: Soft, non-tender, non-distended MUSCULOSKELETAL:  No edema; No deformity  SKIN: Warm and dry NEUROLOGIC:  Alert and oriented x 3 PSYCHIATRIC:  Normal affect   ASSESSMENT:    1. Pain in both lower legs   2. NICM (nonischemic cardiomyopathy) ([place])   3. Essential hypertension    PLAN:    In order of problems listed above:  1. Lower extremity pain: I doubt her symptom is PAD, however will rule out using ABI.  2. Nonischemic cardiomyopathy: Recent echocardiogram showed EF 35%.  Continue on bisoprolol, unable to further uptitrate given some signs of orthostatic dizziness.  3. Hypertension: Blood pressure borderline.  Since addition of bisoprolol, he has been having some orthostatic dizziness.   Medication Adjustments/Labs and Tests Ordered: Current medicines are reviewed at length with the patient today.  Concerns regarding medicines are outlined above.  Orders Placed This Encounter  Procedures  . VAS [place] ABI WITH/WO TBI   No orders of the defined types were placed in this encounter.   Patient Instructions  Medication Instructions:   Continue same medications *If you need a refill on your cardiac medications before your next appointment, please call your pharmacy*   Lab Work: Bmet,magnesium today    Testing/Procedures: Schedule ABI's    Follow-Up: At [facility], you and your health needs are our priority.  As part of our continuing mission to provide you with exceptional heart care, we have created designated Provider Care Teams.  These Care Teams include your primary Cardiologist (physician) and Advanced Practice Providers (APPs -  Physician Assistants and Nurse Practitioners) who all work together to provide you with the care you need, when you need it.  We recommend signing up for the patient portal called "MyChart".  Sign up information is provided on this After Visit Summary.  MyChart is used to connect with patients for Virtual Visits (Telemedicine).  Patients are able to view lab/test results, encounter notes, upcoming appointments, etc.  Non-urgent messages can be sent to your provider as well.   To learn more about what you can do with MyChart, go to [email or web address].    Your next appointment:  2 to 3  months   The format for your next appointment: Office     Provider: Dr.Acharya      Signed, Almyra Deforest, [place]  08/19/2020 10:58 PM    [place]

## 2020-08-18 LAB — BASIC METABOLIC PANEL
BUN/Creatinine Ratio: 13 (ref 9–23)
BUN: 12 mg/dL (ref 6–24)
CO2: 23 mmol/L (ref 20–29)
Calcium: 10 mg/dL (ref 8.7–10.2)
Chloride: 103 mmol/L (ref 96–106)
Creatinine, Ser: 0.95 mg/dL (ref 0.57–1.00)
GFR calc Af Amer: 87 mL/min/{1.73_m2} (ref >59)
GFR calc non Af Amer: 75 mL/min/{1.73_m2} (ref >59)
Glucose: 96 mg/dL (ref 65–99)
Potassium: 4.8 mmol/L (ref 3.5–5.2)
Sodium: 142 mmol/L (ref 134–144)

## 2020-08-18 LAB — MAGNESIUM: Magnesium: 2.3 mg/dL (ref 1.6–2.3)

## 2020-08-19 ENCOUNTER — Encounter: Payer: Self-pay | Admitting: Physician Assistant

## 2020-08-21 ENCOUNTER — Other Ambulatory Visit: Payer: Self-pay | Admitting: Physician Assistant

## 2020-08-21 DIAGNOSIS — M79662 Pain in left lower leg: Secondary | ICD-10-CM

## 2020-08-27 ENCOUNTER — Telehealth: Payer: Self-pay | Admitting: Internal Medicine

## 2020-08-27 NOTE — Telephone Encounter (Signed)
Called patient, advised I did not see the clearance- but to have dentist office sent it over again.  Gave patient fax number, she will have them send it again.

## 2020-08-27 NOTE — Telephone Encounter (Signed)
Patient states she needs oral surgery and the office performing the procedure faxed a clearance request to our office some time last week (patient is unsure of exact date). She states she can not have procedure until they hear back from our office. She would like to know if we received the clearance request. Please advise.

## 2020-09-06 ENCOUNTER — Telehealth: Payer: Self-pay | Admitting: Cardiology

## 2020-09-06 ENCOUNTER — Telehealth: Payer: Self-pay

## 2020-09-06 NOTE — Telephone Encounter (Signed)
This patient called today about preop clearance.  She tells me that the surgeon's office has sent a clearance request twice but it is not in the chart. Can you please contact the patient and give her our fax number so she can give it to the surgeon so they can fax a clearance request.  Kerin Ransom PA-C 09/06/2020 1:11 PM

## 2020-09-06 NOTE — Telephone Encounter (Signed)
   [facility] Pre-operative Risk Assessment    HEARTCARE STAFF: - Please ensure there is not already an duplicate clearance open for this procedure. - Under Visit Info/Reason for Call, type in Other and utilize the format Clearance MM/DD/YY or Clearance TBD. Do not use dashes or single digits. - If request is for dental extraction, please clarify the # of teeth to be extracted.  Request for surgical clearance:  1. What type of surgery is being performed? Teeth Extraction - 7 Teeth   2. When is this surgery scheduled? TBD   3. What type of clearance is required (medical clearance vs. Pharmacy clearance to hold med vs. Both)? Both  4. Are there any medications that need to be held prior to surgery and how long? Anticoagulation Management   5. Practice name and name of physician performing surgery? [facility]t [facility]   6. What is the office phone number? [phone]   7.   What is the office fax number? [phone]  8.   Anesthesia type (None, local, MAC, general) ? General -- Per clearance request please provide recent labs, medication list, need for SBE prophylaxis, clearance for the procedure with proposed anesthesia, pre-op history and physical, cardiac risk assessment, and anticoag medication management.--   Bobby Rumpf 09/06/2020, 3:51 PM  _________________________________________________________________   (provider comments below)

## 2020-09-06 NOTE — Telephone Encounter (Signed)
Please check the fax number for this clearance- apparently clearance was sent to cameron Cox in our administration office.  Kerin Ransom PA-C 09/06/2020 5:03 PM

## 2020-09-06 NOTE — Telephone Encounter (Signed)
   Primary Cardiologist: Elouise Munroe, MD  Chart reviewed and patient contacted today by phone as part of pre-operative protocol coverage. Given past medical history and time since last visit, based on ACC/AHA guidelines, Raven [name] would be at acceptable risk for the planned procedure without further cardiovascular testing.   No need for SBE prophylaxis (antibiotics).   The patient was advised that if she develops new symptoms prior to surgery to contact our office to arrange for a follow-up visit, and she verbalized understanding.  I will route this recommendation to the requesting party via Epic fax function and remove from pre-op pool.  Please call with questions.  Kerin Ransom, PA-C 09/06/2020, 4:05 PM

## 2020-09-06 NOTE — Telephone Encounter (Signed)
Spoke with patient who informed me that she needs to have 7 teeth extracted by Dr. Hoyt Koch, [place]. She gave me the contact information for Dr. Lupita Leash office I called and spoke with the front desk who I gave the [phone] fax number to and she said she will pass the information on to her office manager who is handling the clearance.  DR JENSEN  (p) [phone] (f) [phone]

## 2020-09-07 ENCOUNTER — Ambulatory Visit (HOSPITAL_COMMUNITY)
Admission: RE | Admit: 2020-09-07 | Payer: Medicaid Other | Source: Ambulatory Visit | Attending: Physician Assistant | Admitting: Physician Assistant

## 2020-09-11 ENCOUNTER — Other Ambulatory Visit: Payer: Self-pay

## 2020-09-11 ENCOUNTER — Ambulatory Visit (AMBULATORY_SURGERY_CENTER): Payer: Self-pay

## 2020-09-11 VITALS — Ht 69.0 in | Wt 193.6 lb

## 2020-09-11 DIAGNOSIS — R1319 Other dysphagia: Secondary | ICD-10-CM

## 2020-09-11 DIAGNOSIS — K219 Gastro-esophageal reflux disease without esophagitis: Secondary | ICD-10-CM

## 2020-09-11 DIAGNOSIS — R1310 Dysphagia, unspecified: Secondary | ICD-10-CM

## 2020-09-11 NOTE — Progress Notes (Signed)
No egg or soy allergy known to patient  No issues with past sedation with any surgeries or procedures No intubation problems in the past  No FH of Malignant Hyperthermia No diet pills per patient No home 02 use per patient  No blood thinners per patient  Pt denies issues with constipation  No A fib or A flutter  EMMI video via MyChart  COVID 19 guidelines implemented in PV today with Pt and RN  Coupon given to pt in PV today , Code to Pharmacy   COVID screening scheduled on 09/21/2020 at 10:10 in [place] is aware of appt date/time;   Due to the COVID-19 pandemic we are asking patients to follow these guidelines. Please only bring one care partner. Please be aware that your care partner may wait in the car in the parking lot or if they feel like they will be too hot to wait in the car, they may wait in the lobby on the 4th floor. All care partners are required to wear a mask the entire time (we do not have any that we can provide them), they need to practice social distancing, and we will do a Covid check for all patient's and care partners when you arrive. Also we will check their temperature and your temperature. If the care partner waits in their car they need to stay in the parking lot the entire time and we will call them on their cell phone when the patient is ready for discharge so they can bring the car to the front of the building. Also all patient's will need to wear a mask into building.

## 2020-09-21 ENCOUNTER — Other Ambulatory Visit (HOSPITAL_COMMUNITY)
Admission: RE | Admit: 2020-09-21 | Discharge: 2020-09-21 | Disposition: A | Discharge status: Home / Self Care | Payer: Medicaid Other | Source: Ambulatory Visit | Attending: Gastroenterology | Admitting: Gastroenterology

## 2020-09-21 DIAGNOSIS — Z01812 Encounter for preprocedural laboratory examination: Secondary | ICD-10-CM | POA: Insufficient documentation

## 2020-09-21 DIAGNOSIS — Z20822 Contact with and (suspected) exposure to covid-19: Secondary | ICD-10-CM | POA: Insufficient documentation

## 2020-09-21 LAB — SARS CORONAVIRUS 2 (TAT 6-24 HRS): SARS Coronavirus 2: NEGATIVE

## 2020-09-24 NOTE — Progress Notes (Signed)
Pre call completed for endo procedure tomorrow 09/25/20. Patient states that she has been quarantined since covid test. Will be NPO past midnight, will refrain from smoking day of procedure, and has a ride home post procedure. All questions addressed.

## 2020-09-25 ENCOUNTER — Encounter (HOSPITAL_COMMUNITY)
Admission: RE | Disposition: A | Discharge status: Home / Self Care | Payer: Self-pay | Source: Home / Self Care | Attending: Gastroenterology

## 2020-09-25 ENCOUNTER — Ambulatory Visit (HOSPITAL_COMMUNITY): Payer: Medicaid Other | Admitting: Certified Registered Nurse Anesthetist

## 2020-09-25 ENCOUNTER — Other Ambulatory Visit: Payer: Self-pay

## 2020-09-25 ENCOUNTER — Ambulatory Visit (HOSPITAL_COMMUNITY)
Admission: RE | Admit: 2020-09-25 | Discharge: 2020-09-25 | Disposition: A | Discharge status: Home / Self Care | Payer: Medicaid Other | Attending: Gastroenterology | Admitting: Gastroenterology

## 2020-09-25 ENCOUNTER — Encounter (HOSPITAL_COMMUNITY): Payer: Self-pay | Admitting: Gastroenterology

## 2020-09-25 DIAGNOSIS — J45909 Unspecified asthma, uncomplicated: Secondary | ICD-10-CM | POA: Diagnosis not present

## 2020-09-25 DIAGNOSIS — R1319 Other dysphagia: Secondary | ICD-10-CM

## 2020-09-25 DIAGNOSIS — Z8249 Family history of ischemic heart disease and other diseases of the circulatory system: Secondary | ICD-10-CM | POA: Insufficient documentation

## 2020-09-25 DIAGNOSIS — Z87891 Personal history of nicotine dependence: Secondary | ICD-10-CM | POA: Insufficient documentation

## 2020-09-25 DIAGNOSIS — Z9049 Acquired absence of other specified parts of digestive tract: Secondary | ICD-10-CM | POA: Diagnosis not present

## 2020-09-25 DIAGNOSIS — I509 Heart failure, unspecified: Secondary | ICD-10-CM | POA: Insufficient documentation

## 2020-09-25 DIAGNOSIS — M797 Fibromyalgia: Secondary | ICD-10-CM | POA: Insufficient documentation

## 2020-09-25 DIAGNOSIS — K219 Gastro-esophageal reflux disease without esophagitis: Secondary | ICD-10-CM

## 2020-09-25 DIAGNOSIS — M1990 Unspecified osteoarthritis, unspecified site: Secondary | ICD-10-CM | POA: Insufficient documentation

## 2020-09-25 DIAGNOSIS — Z79891 Long term (current) use of opiate analgesic: Secondary | ICD-10-CM | POA: Insufficient documentation

## 2020-09-25 DIAGNOSIS — I429 Cardiomyopathy, unspecified: Secondary | ICD-10-CM | POA: Insufficient documentation

## 2020-09-25 DIAGNOSIS — F458 Other somatoform disorders: Secondary | ICD-10-CM

## 2020-09-25 DIAGNOSIS — R490 Dysphonia: Secondary | ICD-10-CM

## 2020-09-25 DIAGNOSIS — I110 Hypertensive heart disease with heart failure: Secondary | ICD-10-CM | POA: Insufficient documentation

## 2020-09-25 DIAGNOSIS — R1310 Dysphagia, unspecified: Secondary | ICD-10-CM | POA: Diagnosis not present

## 2020-09-25 DIAGNOSIS — R0989 Other specified symptoms and signs involving the circulatory and respiratory systems: Secondary | ICD-10-CM

## 2020-09-25 HISTORY — PX: SAVORY DILATION: SHX5439

## 2020-09-25 HISTORY — PX: ESOPHAGOGASTRODUODENOSCOPY (EGD) WITH PROPOFOL: SHX5813

## 2020-09-25 LAB — PREGNANCY, URINE: Preg Test, Ur: NEGATIVE

## 2020-09-25 SURGERY — ESOPHAGOGASTRODUODENOSCOPY (EGD) WITH PROPOFOL
Anesthesia: Monitor Anesthesia Care

## 2020-09-25 MED ORDER — PROPOFOL 10 MG/ML IV BOLUS
INTRAVENOUS | Status: DC | PRN
  Administered 2020-09-25: 30 mg via INTRAVENOUS
  Administered 2020-09-25: 20 mg via INTRAVENOUS
  Administered 2020-09-25 (×2): 30 mg via INTRAVENOUS

## 2020-09-25 MED ORDER — PHENYLEPHRINE 40 MCG/ML (10ML) SYRINGE FOR IV PUSH (FOR BLOOD PRESSURE SUPPORT)
PREFILLED_SYRINGE | INTRAVENOUS | Status: DC | PRN
  Administered 2020-09-25: 80 ug via INTRAVENOUS

## 2020-09-25 MED ORDER — PROPOFOL 500 MG/50ML IV EMUL
INTRAVENOUS | Status: DC | PRN
  Administered 2020-09-25: 125 ug/kg/min via INTRAVENOUS

## 2020-09-25 MED ORDER — LACTATED RINGERS IV SOLN
INTRAVENOUS | Status: DC
  Administered 2020-09-25: 1000 mL via INTRAVENOUS

## 2020-09-25 MED ORDER — SODIUM CHLORIDE 0.9 % IV SOLN: INTRAVENOUS | Status: DC

## 2020-09-25 SURGICAL SUPPLY — 14 items

## 2020-09-25 NOTE — Anesthesia Preprocedure Evaluation (Signed)
Anesthesia Evaluation  Patient identified by MRN, date of birth, ID band Patient awake    Reviewed: Allergy & Precautions, NPO status , Patient's Chart, lab work & pertinent test results  Airway Mallampati: II  TM Distance: >3 FB Neck ROM: Full    Dental no notable dental hx.    Pulmonary neg pulmonary ROS, former smoker,    Pulmonary exam normal breath sounds clear to auscultation       Cardiovascular hypertension, +CHF  Normal cardiovascular exam Rhythm:Regular Rate:Normal  Left ventricular ejection fraction, by estimation, is 30 to 35%. The left ventricle has moderate to severely decreased function. The left ventricle demonstrates global hypokinesis. There is mild left ventricular hypertrophy. Left ventricular  diastolic parameters are consistent with Grade I diastolic dysfunction (impaired relaxation). 2. Right ventricular systolic function is normal. The right ventricular size is normal. 3. The mitral valve is normal in structure. Trivial mitral valve regurgitation. No evidence of mitral stenosis. 4. The aortic valve is tricuspid. Aortic valve regurgitation is not visualized. No aortic stenosis is present. 5. The inferior vena cava is normal in size with greater than 50% respiratory variability, suggesting right atrial pressure of 3 mmHg.   Neuro/Psych negative neurological ROS  negative psych ROS   GI/Hepatic Neg liver ROS, GERD  ,  Endo/Other  negative endocrine ROS  Renal/GU negative Renal ROS  negative genitourinary   Musculoskeletal negative musculoskeletal ROS (+)   Abdominal   Peds negative pediatric ROS (+)  Hematology negative hematology ROS (+)   Anesthesia Other Findings   Reproductive/Obstetrics negative OB ROS                             Anesthesia Physical Anesthesia Plan  ASA: III  Anesthesia Plan: MAC   Post-op Pain Management:    Induction:  Intravenous  PONV Risk Score and Plan: 0  Airway Management Planned: Simple Face Mask  Additional Equipment:   Intra-op Plan:   Post-operative Plan:   Informed Consent: I have reviewed the patients History and Physical, chart, labs and discussed the procedure including the risks, benefits and alternatives for the proposed anesthesia with the patient or authorized representative who has indicated his/her understanding and acceptance.     Dental advisory given  Plan Discussed with: CRNA and Surgeon  Anesthesia Plan Comments:         Anesthesia Quick Evaluation

## 2020-09-25 NOTE — Transfer of Care (Signed)
Immediate Anesthesia Transfer of Care Note  Patient: Raven [name]  Procedure(s) Performed: ESOPHAGOGASTRODUODENOSCOPY (EGD) WITH PROPOFOL (N/A ) BALLOON DILATION (N/A )  Patient Location: Endoscopy Unit  Anesthesia Type:MAC  Level of Consciousness: awake, alert , oriented and patient cooperative  Airway & Oxygen Therapy: Patient Spontanous Breathing and Patient connected to face mask  Post-op Assessment: Report given to RN and Post -op Vital signs reviewed and stable  Post vital signs: Reviewed and stable  Last Vitals:  Vitals Value Taken Time  BP 110/49 09/25/20 0846  Temp    Pulse 82 09/25/20 0848  Resp 18 09/25/20 0848  SpO2 98 % 09/25/20 0848  Vitals shown include unvalidated device data.  Last Pain:  Vitals:   09/25/20 0734  TempSrc: Oral  PainSc: 0-No pain         Complications: No complications documented.

## 2020-09-25 NOTE — Op Note (Signed)
[facility] Patient Name: Raven [name] Procedure Date: 09/25/2020 MRN: [id number] Attending MD: Carlota Raspberry. Caulin Begley , MD Date of Birth: 23-Feb-1981 CSN: [id number] Age: 39 Admit Type: Outpatient Procedure:                Upper GI endoscopy Indications:              Dysphagia, Globus sensation - extensive evaluation                            in the past, has subtle UES stenosis that has been                            dilated in the past with significant benefit, here                            for repeat EGD with dilation. Last exam 1 year ago. Providers:                Carlota Raspberry. Havery Moros, MD, Josie Dixon, RN,                            Particia Nearing, RN, William Dalton, Technician, Cletis Athens, Technician Referring MD:              Medicines:                Monitored Anesthesia Care Complications:            No immediate complications. Estimated blood loss:                            Minimal. Estimated Blood Loss:     Estimated blood loss was minimal. Procedure:                Pre-Anesthesia Assessment:                           - Prior to the procedure, a History and Physical                            was performed, and patient medications and                            allergies were reviewed. The patient's tolerance of                            previous anesthesia was also reviewed. The risks                            and benefits of the procedure and the sedation                            options and risks were discussed with the patient.  All questions were answered, and informed consent                            was obtained. Prior Anticoagulants: The patient has                            taken no previous anticoagulant or antiplatelet                            agents. ASA Grade Assessment: III - A patient with                            severe systemic disease. After reviewing the risks                             and benefits, the patient was deemed in                            satisfactory condition to undergo the procedure.                           After obtaining informed consent, the endoscope was                            passed under direct vision. Throughout the                            procedure, the patient's blood pressure, pulse, and                            oxygen saturations were monitored continuously. The                            [id number]-H190 ([id number]) Olympus gastroscope was                            introduced through the mouth, and advanced to the                            second part of duodenum. The upper GI endoscopy was                            accomplished without difficulty. The patient                            tolerated the procedure well. Scope In: Scope Out: Findings:      Esophagogastric landmarks were identified: the Z-line was found at 41       cm, the gastroesophageal junction was found at 41 cm and the upper       extent of the gastric folds was found at 41 cm from the incisors.      The exam of the esophagus was otherwise normal. No obvious stenosis /       stricture noted or inflammatory changes.  A guidewire was placed and the scope was withdrawn. Empiric dilation was       performed in the entire esophagus with a Savary dilator with mild       resistance at 17 mm and 18 mm. Relook endoscopy showed 2 appropriate       mucosal wrents at the UES.      The entire examined stomach was normal.      The duodenal bulb and second portion of the duodenum were normal. Impression:               - Esophagogastric landmarks identified.                           - Normal esophagus otherwise - empiric dilation to                            16m performed, leading to 2 appropriate mucosal                            wrents at the UES. Patient has a subtle stenosis                            there, hopefully dilation will provide benefit as                             it has in the past.                           - Normal stomach.                           - Normal duodenal bulb and second portion of the                            duodenum. Moderate Sedation:      No moderate sedation, case performed with MAC Recommendation:           - Patient has a contact number available for                            emergencies. The signs and symptoms of potential                            delayed complications were discussed with the                            patient. Return to normal activities tomorrow.                            Written discharge instructions were provided to the                            patient.                           - Post dilation diet.                           -  Continue present medications.                           - Await course post dilation Procedure Code(s):        --- Professional ---                           [phone], Esophagogastroduodenoscopy, flexible,                            transoral; with insertion of guide wire followed by                            passage of dilator(s) through esophagus over guide                            wire Diagnosis Code(s):        --- Professional ---                           R13.10, Dysphagia, unspecified                           F45.8, Other somatoform disorders CPT copyright 2019 [employer]. All rights reserved. The codes documented in this report are preliminary and upon coder review may  be revised to meet current compliance requirements. Remo Lipps P. Havery Moros, MD 09/25/2020 8:46:41 AM This report has been signed electronically. Number of Addenda: 0

## 2020-09-25 NOTE — Interval H&P Note (Signed)
History and Physical Interval Note:  09/25/2020 8:14 AM  Raven [name]  has presented today for surgery, with the diagnosis of oropharyngeal dysphagia, reflux.  The various methods of treatment have been discussed with the patient and family. After consideration of risks, benefits and other options for treatment, the patient has consented to  Procedure(s): ESOPHAGOGASTRODUODENOSCOPY (EGD) WITH PROPOFOL (N/A) BALLOON DILATION (N/A) as a surgical intervention.  The patient's history has been reviewed, patient examined, no change in status, stable for surgery.  I have reviewed the patient's chart and labs.  Questions were answered to the patient's satisfaction.     [place]

## 2020-09-25 NOTE — Anesthesia Postprocedure Evaluation (Signed)
Anesthesia Post Note  Patient: Raven [name]  Procedure(s) Performed: ESOPHAGOGASTRODUODENOSCOPY (EGD) WITH PROPOFOL (N/A ) BALLOON DILATION (N/A )     Patient location during evaluation: PACU Anesthesia Type: MAC Level of consciousness: awake and alert Pain management: pain level controlled Vital Signs Assessment: post-procedure vital signs reviewed and stable Respiratory status: spontaneous breathing, nonlabored ventilation, respiratory function stable and patient connected to nasal cannula oxygen Cardiovascular status: stable and blood pressure returned to baseline Postop Assessment: no apparent nausea or vomiting Anesthetic complications: no   No complications documented.  Last Vitals:  Vitals:   09/25/20 0734 09/25/20 0850  BP: 134/79 (!) 121/54  Pulse: 74 80  Resp: 17 (!) 23  Temp: 37 C 36.7 C  SpO2: 98% 98%    Last Pain:  Vitals:   09/25/20 0850  TempSrc: Oral  PainSc: 0-No pain                 Tarek Cravens S

## 2020-09-25 NOTE — H&P (Signed)
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 She has had empiric dilation of the esophagus in the past with ? Subtle stenosis at the UES, dilation is the only thing that has helped these symptoms thus far, however has not provided long term relief. Given the severity of her symptoms which persist she is requesting another EGD with dilation for this issue. Otherwise feels well. She had an echo in the summer showing EF of 30-35%, at the hospital for anesthesia support for her case. She is being followed by cardiology. She denies other complaints at this time.   Past Medical History:  Diagnosis Date  . Abnormal Pap smear   . Achalasia    esphog stretched several times  . Allergy    seasonal allergies  . Anxiety    on meds  . 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 Asthma    rarely uses inhaler-PRN-stopped smoking-symptoms decreased  . Blood transfusion 01/2006   [facility])   . Cardiomyopathy, idiopathic ([place]) 01/09/2014  . CHF (congestive heart failure) ([place]) 06/2020   on meds  . Chronically on opiate therapy 01/18/2018  . Constipation due to opioid therapy   . Depression   . DEPRESSION 10/09/2008   Qualifier: Diagnosis of  By: Tilden Dome  -on meds  . Dysphagia 08/10/2009   Qualifier: Diagnosis of  By: Shane Crutch, Amy S   . Dysrhythmia   . ESOPHAGEAL STRICTURE 08/10/2009   Qualifier: Diagnosis of  By: Trellis Paganini PA-c, Amy S   . Fibromyalgia    on meds  . Gallstones and inflammation of gallbladder without obstruction 01/23/2015  . GERD (gastroesophageal reflux disease)    on meds - diet controlled  . 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 Heart murmur   . Heart palpitations    tx with atenolol by Kaelin Bonelli klein   . Hiatal hernia   . History of abnormal Pap smear   . History of chlamydia 1990's  . 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  . HSV-2 infection   . HTN  (hypertension)    on meds  . Occipital neuralgia of right side 04/21/2017  . Palpitation 04/11/2011  . Pneumonia 05/2013  . PVC's (premature ventricular contractions)   . Shortness of breath dyspnea   . 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 Status post dilation of esophageal narrowing   . UTI (urinary tract infection) 02/12/2017   E Coli Rx Septra DS Unable to reach patient, busy signal  . Vaginal Pap smear, abnormal   . Vocal cord paralysis      Past Surgical History:  Procedure Laterality Date  . ADENOIDECTOMY  12/2002  . BLADDER SUSPENSION  06/11/2012   Procedure: TRANSVAGINAL TAPE (TVT) PROCEDURE;  Surgeon: Delice Lesch, MD;  Location: [facility];  Service: Gynecology;  Laterality: N/A;  Tension Free Vaginal Tape/cystoscopy  . BREAST SURGERY  07/2001   tumor removed right breast  . CHOLECYSTECTOMY N/A 01/23/2015   Procedure: LAPAROSCOPIC CHOLECYSTECTOMY ;  Surgeon: Fanny Skates, MD;  Location: [facility];  Service: General;  Laterality: N/A;  case booked with cholangiogram but MD did not complete intraoperative cholangiogram   . COLONOSCOPY  2020   1 polyp/TICS  . CYSTOSCOPY  06/11/2012   Procedure: CYSTOSCOPY;  Surgeon: Delice Lesch, MD;  Location: [facility];  Service: Gynecology;  Laterality: N/A;  .  DILATION AND CURETTAGE OF UTERUS  1999  . DILATION AND CURETTAGE OF UTERUS  01/2006   retained products  . ESOPHAGEAL MANOMETRY N/A 02/23/2019   Procedure: ESOPHAGEAL MANOMETRY (EM);  Surgeon: Mauri Pole, MD;  Location: [facility];  Service: Endoscopy;  Laterality: N/A;  . ESOPHAGOGASTRODUODENOSCOPY     with esophageal dilation x 3  . FOREIGN BODY REMOVAL  06/11/2012   Procedure: REMOVAL FOREIGN BODY EXTREMITY;  Surgeon: Delice Lesch, MD;  Location: [facility];  Service: Gynecology;  Laterality: Right;  Removal of Implanon   . LAPAROSCOPIC TUBAL LIGATION  06/11/2012   Procedure: LAPAROSCOPIC TUBAL LIGATION;  Surgeon: Delice Lesch, MD;  Location: [facility];  Service: Gynecology;  Laterality: Bilateral;  Laparoscopic Bilateral Fulgeration  . POLYPECTOMY  2020   tics/1 polyp  . TONSILLECTOMY     Family History  Problem Relation Age of Onset  . Heart disease Father   . Hypertension Father   . Colon polyps Father 59  . Stroke Mother   . Hypertension Mother   . Heart attack Mother   . Breast cancer Mother   . Neuropathy Mother   . Arthritis Mother   . Asthma Mother   . Peripheral Artery Disease Mother 17       had her leg amputated 2020  . Neuropathy Sister   . Arthritis Sister   . Asthma Sister   . Stomach cancer Paternal Uncle 40  . Colon cancer Paternal Uncle 62  . Colon polyps Paternal Uncle 36  . Esophageal cancer Neg Hx   . Rectal cancer Neg Hx    Social History   Tobacco Use  . Smoking status: Former Smoker    Packs/day: 0.25    Years: 15.00    Pack years: 3.75    Types: Cigarettes    Quit date: 04/29/2019    Years since quitting: 1.4  . Smokeless tobacco: Never Used  . Tobacco comment: stopped smoking 04-29-2019  Vaping Use  . Vaping Use: Never used  Substance Use Topics  . Alcohol use: No    Comment: special occassions  . Drug use: No   Current Facility-Administered Medications  Medication Dose Route Frequency Provider Last Rate Last Admin  . 0.9 %  sodium chloride infusion   Intravenous Continuous Rawn Quiroa, Carlota Raspberry, MD      . lactated ringers infusion   Intravenous Continuous Laverne Hursey, Carlota Raspberry, MD 10 mL/hr at 09/25/20 0749 1,000 mL at 09/25/20 0749   Allergies  Allergen Reactions  . Dust Mite Extract Shortness Of Breath  . Other Shortness Of Breath    Feathers, trees "almost everything outside"  . Pollen Extract Shortness Of Breath     Review of Systems: All systems reviewed and negative except where noted in HPI.   Lab Results  Component Value Date   WBC 10.2 06/15/2020   HGB 13.9 06/15/2020   HCT 41.2 06/15/2020   MCV 90.7 06/15/2020   PLT 239 06/15/2020     Lab Results  Component Value Date   CREATININE 0.95 08/17/2020   BUN 12 08/17/2020   NA 142 08/17/2020   K 4.8 08/17/2020   CL 103 08/17/2020   CO2 23 08/17/2020    Lab Results  Component Value Date   ALT 27 06/15/2020   AST 23 06/15/2020   ALKPHOS 54 06/15/2020   BILITOT 0.7 06/15/2020     Physical Exam: BP 134/79   Pulse 74   Temp 98.6 F (37 C) (Oral)   Resp  17   Ht 5\' 9"  (1.753 m)   Wt 87.8 kg   SpO2 98%   BMI 28.58 kg/m  Constitutional: Pleasant, female in no acute distress. Cardiovascular: Normal rate, regular rhythm.  Pulmonary/chest: Effort normal and breath sounds normal.  Abdominal: Soft, nondistended, nontender. . There are no masses palpable.  Extremities: no edema Psychiatric: Normal mood and affect. Behavior is normal.   ASSESSMENT AND PLAN: 39 y/o female with cardiomyopathy as above, longstanding globus and dysphagia s/p extensive evaluation between GI and ENT, here for follow up EGD as empiric dilation is the only thing that has provided her any relief up to this point. I have discussed risks / benefits of EGD and dilation with her as well as risks of anesthesia, she wishes to proceed. Further recommendations pending the results and her course.   Victoria Cellar, MD [facility]

## 2020-09-27 ENCOUNTER — Ambulatory Visit (HOSPITAL_COMMUNITY): Payer: Medicaid Other

## 2020-09-27 ENCOUNTER — Encounter (HOSPITAL_COMMUNITY): Payer: Self-pay | Admitting: Gastroenterology

## 2020-10-25 ENCOUNTER — Ambulatory Visit (HOSPITAL_COMMUNITY)
Admission: RE | Admit: 2020-10-25 | Payer: Medicaid Other | Source: Ambulatory Visit | Attending: Physician Assistant | Admitting: Physician Assistant

## 2020-11-02 ENCOUNTER — Ambulatory Visit: Payer: Medicaid Other | Admitting: Internal Medicine

## 2020-11-13 ENCOUNTER — Ambulatory Visit (HOSPITAL_COMMUNITY)
Admission: RE | Admit: 2020-11-13 | Payer: Medicaid Other | Source: Ambulatory Visit | Attending: Physician Assistant | Admitting: Physician Assistant

## 2020-11-19 ENCOUNTER — Ambulatory Visit: Payer: Medicaid Other | Admitting: Internal Medicine

## 2021-01-31 NOTE — Progress Notes (Signed)
Cardiology Office Note:    Date:  02/01/2021   ID:  Raven [name], DOB 1981-03-05, MRN [id number]  PCP:  Lin Landsman, MD  Cardiologist:  Elouise Munroe, MD  Electrophysiologist:  None   Referring MD: Lin Landsman, MD   Chief Complaint/Reason for Referral: NICM  History of Present Illness:    Raven [name] is a 40 y.o. female with a history of symptomatic PVCs, anxiety, HTN and nonischemic cardiomyopathy who presents for further evaluation of HTN, palpitations, and follow up of nonischemic cardiomyopathy.   Due to symptom of dysphagia and inability to swallow metoprolol, this medication was later changed to bisoprolol 2.5 mg daily.   Palpitations worsening. Not taking bisoprolol.   Will try metoprolol tartrate 12.5 mg BID.   Past Medical History:  Diagnosis Date  . Abnormal Pap smear   . Achalasia    esphog stretched several times  . Allergy    seasonal allergies  . Anxiety    on meds  . 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 Asthma    rarely uses inhaler-PRN-stopped smoking-symptoms decreased  . Blood transfusion 01/2006   [facility] [place] [facility])   . Cardiomyopathy, idiopathic ([place]) 01/09/2014  . CHF (congestive heart failure) ([place]) 06/2020   on meds  . Chronically on opiate therapy 01/18/2018  . Constipation due to opioid therapy   . Depression   . DEPRESSION 10/09/2008   Qualifier: Diagnosis of  By: Tilden Dome  -on meds  . Dysphagia 08/10/2009   Qualifier: Diagnosis of  By: Shane Crutch, Amy S   . Dysrhythmia   . ESOPHAGEAL STRICTURE 08/10/2009   Qualifier: Diagnosis of  By: Trellis Paganini PA-c, Amy S   . Fibromyalgia    on meds  . Gallstones and inflammation of gallbladder without obstruction 01/23/2015  . GERD (gastroesophageal reflux disease)    on meds - diet controlled  . 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 Heart murmur   . Heart palpitations    tx with atenolol by steven klein   . Hiatal hernia   . History of abnormal  Pap smear   . History of chlamydia 1990's  . 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  . HSV-2 infection   . HTN (hypertension)    on meds  . Occipital neuralgia of right side 04/21/2017  . Palpitation 04/11/2011  . Pneumonia 05/2013  . PVC's (premature ventricular contractions)   . Shortness of breath dyspnea   . 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 Status post dilation of esophageal narrowing   . UTI (urinary tract infection) 02/12/2017   E Coli Rx Septra DS Unable to reach patient, busy signal  . Vaginal Pap smear, abnormal   . Vocal cord paralysis     Past Surgical History:  Procedure Laterality Date  . ADENOIDECTOMY  12/2002  . BLADDER SUSPENSION  06/11/2012   Procedure: TRANSVAGINAL TAPE (TVT) PROCEDURE;  Surgeon: Delice Lesch, MD;  Location: [facility];  Service: Gynecology;  Laterality: N/A;  Tension Free Vaginal Tape/cystoscopy  . BREAST SURGERY  07/2001   tumor removed right breast  . CHOLECYSTECTOMY N/A 01/23/2015   Procedure: LAPAROSCOPIC CHOLECYSTECTOMY ;  Surgeon: Fanny Skates, MD;  Location: [facility];  Service: General;  Laterality: N/A;  case booked with cholangiogram but MD did not complete intraoperative cholangiogram   . COLONOSCOPY  2020   1 polyp/TICS  . CYSTOSCOPY  06/11/2012   Procedure: CYSTOSCOPY;  Surgeon:  Delice Lesch, MD;  Location: [facility];  Service: Gynecology;  Laterality: N/A;  . DILATION AND CURETTAGE OF UTERUS  1999  . DILATION AND CURETTAGE OF UTERUS  01/2006   retained products  . ESOPHAGEAL MANOMETRY N/A 02/23/2019   Procedure: ESOPHAGEAL MANOMETRY (EM);  Surgeon: Mauri Pole, MD;  Location: [facility];  Service: Endoscopy;  Laterality: N/A;  . ESOPHAGOGASTRODUODENOSCOPY     with esophageal dilation x 3  . ESOPHAGOGASTRODUODENOSCOPY (EGD) WITH PROPOFOL N/A 09/25/2020   Procedure: ESOPHAGOGASTRODUODENOSCOPY (EGD) WITH PROPOFOL;  Surgeon: Yetta Flock, MD;  [facility];  Service:  Gastroenterology;  Laterality: N/A;  . FOREIGN BODY REMOVAL  06/11/2012   Procedure: REMOVAL FOREIGN BODY EXTREMITY;  Surgeon: Delice Lesch, MD;  Location: [facility];  Service: Gynecology;  Laterality: Right;  Removal of Implanon   . LAPAROSCOPIC TUBAL LIGATION  06/11/2012   Procedure: LAPAROSCOPIC TUBAL LIGATION;  Surgeon: Delice Lesch, MD;  Location: [facility];  Service: Gynecology;  Laterality: Bilateral;  Laparoscopic Bilateral Fulgeration  . POLYPECTOMY  2020   tics/1 polyp  . SAVORY DILATION N/A 09/25/2020   Procedure: SAVORY DILATION;  Surgeon: Yetta Flock, MD;  [facility];  Service: Gastroenterology;  Laterality: N/A;  . TONSILLECTOMY      Current Medications: Current Meds  Medication Sig  . dicyclomine (BENTYL) 10 MG capsule Take 1-2 every 8 hours as needed (Patient taking differently: Take 10-20 mg by mouth every 8 (eight) hours as needed for spasms.)  . FLUoxetine (PROZAC) 20 MG/5ML solution Take 20 mg by mouth daily.   . hydrOXYzine (ATARAX/VISTARIL) 50 MG tablet Take 50 mg by mouth every 6 (six) hours as needed for anxiety.   Marland Kitchen LATUDA 60 MG TABS Take 60 mg by mouth daily.   Marland Kitchen linaclotide (LINZESS) 290 MCG CAPS capsule Take 1 capsule (290 mcg total) by mouth daily before breakfast.  . LORazepam (ATIVAN) 2 MG tablet Take 2 mg by mouth 3 (three) times daily.  Marland Kitchen oxyCODONE-acetaminophen (PERCOCET) 10-325 MG tablet Take 1 tablet by mouth every 4 (four) hours.  . pantoprazole sodium (PROTONIX) 40 mg/20 mL PACK Take 20 mLs (40 mg total) by mouth daily.  . traZODone (DESYREL) 50 MG tablet Take 50 mg by mouth at bedtime.      Allergies:   Dust mite extract, Other, and Pollen extract   Social History   Tobacco Use  . Smoking status: Former Smoker    Packs/day: 0.25    Years: 15.00    Pack years: 3.75    Types: Cigarettes    Quit date: 04/29/2019    Years since quitting: 1.7  . Smokeless tobacco: Never Used  . Tobacco comment: stopped smoking 04-29-2019   Vaping Use  . Vaping Use: Never used  Substance Use Topics  . Alcohol use: No    Comment: special occassions  . Drug use: No     Family History: The patient's family history includes Arthritis in her mother and sister; Asthma in her mother and sister; Breast cancer in her mother; Colon cancer (age of onset: 88) in her paternal uncle; Colon polyps (age of onset: 38) in her father and paternal uncle; Heart attack in her mother; Heart disease in her father; Hypertension in her father and mother; Neuropathy in her mother and sister; Peripheral Artery Disease (age of onset: 1) in her mother; Stomach cancer (age of onset: 64) in her paternal uncle; Stroke in her mother. There is no history of Esophageal cancer or Rectal cancer.  ROS:   Please see the history of present illness.    All other systems reviewed and are negative.  EKGs/Labs/Other Studies Reviewed:    The following studies were reviewed today:  EKG:  NSR, poor R wave progression    Recent Labs: 06/15/2020: ALT 27; Hemoglobin 13.9; Platelets 239 08/17/2020: BUN 12; Creatinine, Ser 0.95; Magnesium 2.3; Potassium 4.8; Sodium 142  Recent Lipid Panel No results found for: CHOL, TRIG, HDL, CHOLHDL, VLDL, LDLCALC, LDLDIRECT  Physical Exam:    VS:  BP 134/82   Pulse 85   Ht 5\' 9"  (1.753 m)   Wt 196 lb (88.9 kg)   SpO2 98%   BMI 28.94 kg/m     Wt Readings from Last 5 Encounters:  02/01/21 196 lb (88.9 kg)  09/25/20 193 lb 9 oz (87.8 kg)  09/11/20 193 lb 9.6 oz (87.8 kg)  08/17/20 186 lb 6.4 oz (84.6 kg)  07/26/20 190 lb (86.2 kg)    Constitutional: No acute distress Eyes: sclera non-icteric, normal conjunctiva and lids ENMT: normal dentition, moist mucous membranes Cardiovascular: regular rhythm, normal rate, no murmurs. S1 and S2 normal. Radial pulses normal bilaterally. No jugular venous distention.  Respiratory: clear to auscultation bilaterally GI : normal bowel sounds, soft and nontender. No distention.   MSK:  extremities warm, well perfused. No edema.  NEURO: grossly nonfocal exam, moves all extremities. PSYCH: alert and oriented x 3, normal mood and affect.   ASSESSMENT:    1. PVC's (premature ventricular contractions)   2. NICM (nonischemic cardiomyopathy) (HCC)    PLAN:    PVC's (premature ventricular contractions) - Plan: EKG 12-Lead  NICM (nonischemic cardiomyopathy) ([place])   - has not been able to tolerate GDMT for reduced EF. Will trial lopressor for palpitations. If symptoms improve we will plan for CMR for HF assessment in 6 weeks.   Lives in [place], will plan for follow up with Dr. Harriet Masson for convenience and [facility] expertise.   Total time of encounter: 30 minutes total time of encounter, including 25 minutes spent in face-to-face patient care on the date of this encounter. This time includes coordination of care and counseling regarding above mentioned problem list. Remainder of non-face-to-face time involved reviewing chart documents/testing relevant to the patient encounter and documentation in the medical record. I have independently reviewed documentation from referring provider.   Cherlynn Kaiser, MD [place]  [facility]    Medication Adjustments/Labs and Tests Ordered: Current medicines are reviewed at length with the patient today.  Concerns regarding medicines are outlined above.   Orders Placed This Encounter  Procedures  . EKG 12-Lead     Meds ordered this encounter  Medications  . metoprolol tartrate (LOPRESSOR) 25 MG tablet    Sig: Take 0.5 tablets (12.5 mg total) by mouth 2 (two) times daily.    Dispense:  30 tablet    Refill:  3    Patient Instructions  Medication Instructions:  START: METOPROLOL TARTRATE 12.5mg  TWICE DAILY  *If you need a refill on your cardiac medications before your next appointment, please call your pharmacy*  Testing/Procedures: Your physician has requested that you have a cardiac MRI in 6 weeks. Cardiac MRI uses  a computer to create images of your heart as its beating, producing both still and moving pictures of your heart and major blood vessels. For further information please visit [email or web address] Please follow the instruction sheet given to you today for more information.  Follow-Up: At [facility], you and your health needs are  our priority.  As part of our continuing mission to provide you with exceptional heart care, we have created designated Provider Care Teams.  These Care Teams include your primary Cardiologist (physician) and Advanced Practice Providers (APPs -  Physician Assistants and Nurse Practitioners) who all work together to provide you with the care you need, when you need it.  Your next appointment:   (3-4) WEEKS- NEEDS TO ESTABLISH CARE WITH Dr. Harriet Masson.   The format for your next appointment:   In Person  Provider:   Berniece Salines, DO

## 2021-02-01 ENCOUNTER — Ambulatory Visit (INDEPENDENT_AMBULATORY_CARE_PROVIDER_SITE_OTHER): Payer: Medicaid Other | Admitting: Internal Medicine

## 2021-02-01 ENCOUNTER — Other Ambulatory Visit: Payer: Self-pay

## 2021-02-01 ENCOUNTER — Encounter: Payer: Self-pay | Admitting: Internal Medicine

## 2021-02-01 VITALS — BP 134/82 | HR 85 | Ht 69.0 in | Wt 196.0 lb

## 2021-02-01 DIAGNOSIS — I5020 Unspecified systolic (congestive) heart failure: Secondary | ICD-10-CM

## 2021-02-01 DIAGNOSIS — I428 Other cardiomyopathies: Secondary | ICD-10-CM | POA: Diagnosis not present

## 2021-02-01 DIAGNOSIS — I493 Ventricular premature depolarization: Secondary | ICD-10-CM | POA: Diagnosis not present

## 2021-02-01 MED ORDER — METOPROLOL TARTRATE 25 MG PO TABS: 12.5000 mg | ORAL_TABLET | Freq: Two times a day (BID) | ORAL | 3 refills | Status: DC

## 2021-02-01 NOTE — Patient Instructions (Addendum)
Medication Instructions:  START: METOPROLOL TARTRATE 12.5mg  TWICE DAILY  *If you need a refill on your cardiac medications before your next appointment, please call your pharmacy*  Testing/Procedures: Your physician has requested that you have a cardiac MRI in 6 weeks. Cardiac MRI uses a computer to create images of your heart as its beating, producing both still and moving pictures of your heart and major blood vessels. For further information please visit [email or web address] Please follow the instruction sheet given to you today for more information.  Follow-Up: At [facility], you and your health needs are our priority.  As part of our continuing mission to provide you with exceptional heart care, we have created designated Provider Care Teams.  These Care Teams include your primary Cardiologist (physician) and Advanced Practice Providers (APPs -  Physician Assistants and Nurse Practitioners) who all work together to provide you with the care you need, when you need it.  Your next appointment:   (3-4) WEEKS- NEEDS TO ESTABLISH CARE WITH Dr. Harriet Masson.   The format for your next appointment:   In Person  Provider:   Berniece Salines, DO

## 2021-02-05 ENCOUNTER — Telehealth: Payer: Self-pay | Admitting: Internal Medicine

## 2021-02-05 NOTE — Telephone Encounter (Signed)
Spoke with patient to discuss preferred weekdays and times for scheduling the Cardiac MRI ordered by Dr. Margaretann Loveless.

## 2021-02-06 ENCOUNTER — Encounter: Payer: Self-pay | Admitting: Internal Medicine

## 2021-02-06 NOTE — Telephone Encounter (Signed)
Spoke with patient regarding scheduled appointment 03/11/21 at 9:00 am for the Cardiac MRI---arrival time is 8:30 am---1st floor admissions office at [facility]----will ail information to patient and it is also available in My Chart

## 2021-02-21 DIAGNOSIS — T402X5A Adverse effect of other opioids, initial encounter: Secondary | ICD-10-CM | POA: Insufficient documentation

## 2021-02-21 DIAGNOSIS — M1990 Unspecified osteoarthritis, unspecified site: Secondary | ICD-10-CM | POA: Insufficient documentation

## 2021-02-21 DIAGNOSIS — K449 Diaphragmatic hernia without obstruction or gangrene: Secondary | ICD-10-CM | POA: Insufficient documentation

## 2021-02-21 DIAGNOSIS — I499 Cardiac arrhythmia, unspecified: Secondary | ICD-10-CM | POA: Insufficient documentation

## 2021-02-21 DIAGNOSIS — I10 Essential (primary) hypertension: Secondary | ICD-10-CM | POA: Insufficient documentation

## 2021-02-21 DIAGNOSIS — R87629 Unspecified abnormal cytological findings in specimens from vagina: Secondary | ICD-10-CM | POA: Insufficient documentation

## 2021-02-21 DIAGNOSIS — K5903 Drug induced constipation: Secondary | ICD-10-CM | POA: Insufficient documentation

## 2021-02-21 DIAGNOSIS — T7840XA Allergy, unspecified, initial encounter: Secondary | ICD-10-CM | POA: Insufficient documentation

## 2021-02-21 DIAGNOSIS — I429 Cardiomyopathy, unspecified: Secondary | ICD-10-CM | POA: Insufficient documentation

## 2021-02-21 DIAGNOSIS — J3800 Paralysis of vocal cords and larynx, unspecified: Secondary | ICD-10-CM | POA: Insufficient documentation

## 2021-02-21 DIAGNOSIS — Z8719 Personal history of other diseases of the digestive system: Secondary | ICD-10-CM | POA: Insufficient documentation

## 2021-02-25 ENCOUNTER — Ambulatory Visit: Payer: Medicaid Other | Admitting: Cardiology

## 2021-03-08 ENCOUNTER — Telehealth (HOSPITAL_COMMUNITY): Payer: Self-pay | Admitting: Emergency Medicine

## 2021-03-08 NOTE — Telephone Encounter (Signed)
Reaching out to patient to offer assistance regarding upcoming cardiac imaging study; pt verbalizes understanding of appt date/time, parking situation and where to check in, and verified current allergies; name and call back number provided for further questions should they arise Marchia Bond RN [place] [facility] [phone] office [phone] cell  Pt states she will take her ativan 30 mins prior to scan Clarise Cruz

## 2021-03-11 ENCOUNTER — Other Ambulatory Visit: Payer: Self-pay

## 2021-03-11 ENCOUNTER — Ambulatory Visit (HOSPITAL_COMMUNITY)
Admission: RE | Admit: 2021-03-11 | Discharge: 2021-03-11 | Disposition: A | Discharge status: Home / Self Care | Payer: Medicare HMO | Source: Ambulatory Visit | Attending: Internal Medicine | Admitting: Internal Medicine

## 2021-03-11 DIAGNOSIS — I428 Other cardiomyopathies: Secondary | ICD-10-CM | POA: Diagnosis present

## 2021-03-11 DIAGNOSIS — I5020 Unspecified systolic (congestive) heart failure: Secondary | ICD-10-CM | POA: Insufficient documentation

## 2021-03-11 MED ORDER — GADOBUTROL 1 MMOL/ML IV SOLN
8.0000 mL | Freq: Once | INTRAVENOUS | Status: AC | PRN
  Administered 2021-03-11: 8 mL via INTRAVENOUS

## 2021-03-21 ENCOUNTER — Ambulatory Visit (INDEPENDENT_AMBULATORY_CARE_PROVIDER_SITE_OTHER): Payer: Medicare HMO | Admitting: Pharmacist Clinician (PhC)/ Clinical Pharmacy Specialist

## 2021-03-21 ENCOUNTER — Other Ambulatory Visit: Payer: Self-pay

## 2021-03-21 VITALS — BP 108/72 | HR 108 | Resp 16 | Ht 69.0 in | Wt 191.4 lb

## 2021-03-21 DIAGNOSIS — I509 Heart failure, unspecified: Secondary | ICD-10-CM | POA: Diagnosis not present

## 2021-03-21 DIAGNOSIS — I5020 Unspecified systolic (congestive) heart failure: Secondary | ICD-10-CM | POA: Diagnosis not present

## 2021-03-21 MED ORDER — LISINOPRIL 5 MG PO TABS: 5.0000 mg | ORAL_TABLET | Freq: Every day | ORAL | 6 refills | Status: DC

## 2021-03-21 NOTE — Patient Instructions (Signed)
Return for a a follow up appointment April 26 at 11 am  Go to the lab in 2 weeks (sometime between April 6-12)  Take your meds as follows:  Start LISINOPRIL 5 mg once daily - this is fine to crush.    Continue with all other medications  Bring all of your meds, your BP cuff and your record of home blood pressures to your next appointment.  Exercise as you're able, try to walk approximately 30 minutes per day.  Keep salt intake to a minimum, especially watch canned and prepared boxed foods.  Eat more fresh fruits and vegetables and fewer canned items.  Avoid eating in fast food restaurants.    HOW TO TAKE YOUR BLOOD PRESSURE: . Rest 5 minutes before taking your blood pressure. .  Don't smoke or drink caffeinated beverages for at least 30 minutes before. . Take your blood pressure before (not after) you eat. . Sit comfortably with your back supported and both feet on the floor (don't cross your legs). . Elevate your arm to heart level on a table or a desk. . Use the proper sized cuff. It should fit smoothly and snugly around your bare upper arm. There should be enough room to slip a fingertip under the cuff. The bottom edge of the cuff should be 1 inch above the crease of the elbow. . Ideally, take 3 measurements at one sitting and record the average.

## 2021-03-21 NOTE — Progress Notes (Signed)
03/27/2021 Nolene Ebbs GENORA ARP 07/28/1981 [id number]   HPI:  Raven [name] is a 40 y.o. female patient of Dr Margaretann Loveless, with a PMH below who presents today for heart failure medication titration.  In addition to heart failure, her medical history is notable for esophageal stricture.  Because of this, she has difficulty swallowing medications and needs products that can be safely crushed.  Patient was seen by Dr. Margaretann Loveless last month at which time she was started on metoprolol tart 12.5 mg bid for her HFrEF as well as PVC's and palpitations.  An MRI done just a few weeks ago showed her EF to be at 34% with global hypokinesis.   She is here today to work on getting GDMT in forms that are safe to crush.    Blood Pressure Goal:  130/80  Current Medications: metoprolol 12.5 mg bid  Family Hx: mother had PAD, leg amputation 2 yrs ago, also has first MI at 84 and hypertension - now late 38's; dad in his 47's - hypertension; 3 sisters - none with CV issues  Social Hx: smoking black cigars - 2x per week; no alcohol except rare holidays/events; no caffeine  Diet: drinks water, juice; rare soda; mostly home cooked meals, grinds/processes food to eat; not many meats; weight dropped to < 190 since throat issues; uses beans and eggs for protein; raw veggies/salad  Exercise: joined silver sneakers - mostly walking/biking; too much casuse palpitiations  Home BP readings:  No home meter  Intolerances: no cardiac medication intolerances  Labs: 8/21  Na 142, K 4.8, Glu 96, BUN 12, SCr 0.95 GFR 87   Wt Readings from Last 3 Encounters:  03/21/21 191 lb 6.4 oz (86.8 kg)  02/01/21 196 lb (88.9 kg)  09/25/20 193 lb 9 oz (87.8 kg)   BP Readings from Last 3 Encounters:  03/21/21 108/72  02/01/21 134/82  09/25/20 127/70   Pulse Readings from Last 3 Encounters:  03/21/21 (!) 108  02/01/21 85  09/25/20 69    Current Outpatient Medications  Medication Sig Dispense Refill  . dicyclomine (BENTYL) 10 MG  capsule Take 1-2 every 8 hours as needed (Patient taking differently: Take 10-20 mg by mouth every 8 (eight) hours as needed for spasms.) 90 capsule 3  . FLUoxetine (PROZAC) 20 MG/5ML solution Take 20 mg by mouth daily.   3  . furosemide (LASIX) 40 MG tablet Take 1 tablet (40 mg total) by mouth daily. 30 tablet 3  . hydrOXYzine (ATARAX/VISTARIL) 50 MG tablet Take 50 mg by mouth every 6 (six) hours as needed for anxiety.   0  . LATUDA 60 MG TABS Take 60 mg by mouth daily.   0  . linaclotide (LINZESS) 290 MCG CAPS capsule Take 1 capsule (290 mcg total) by mouth daily before breakfast. 90 capsule 3  . lisinopril (ZESTRIL) 5 MG tablet Take 1 tablet (5 mg total) by mouth daily. 30 tablet 6  . LORazepam (ATIVAN) 2 MG tablet Take 2 mg by mouth 3 (three) times daily.    . metoprolol tartrate (LOPRESSOR) 25 MG tablet Take 0.5 tablets (12.5 mg total) by mouth 2 (two) times daily. 30 tablet 3  . oxyCODONE-acetaminophen (PERCOCET) 10-325 MG tablet Take 1 tablet by mouth every 4 (four) hours.  0  . pantoprazole sodium (PROTONIX) 40 mg/20 mL PACK Take 20 mLs (40 mg total) by mouth daily. 600 mL 11   No current facility-administered medications for this visit.    Allergies  Allergen Reactions  .  Dust Mite Extract Shortness Of Breath  . Other Shortness Of Breath    Feathers, trees "almost everything outside"  . Pollen Extract Shortness Of Breath    Past Medical History:  Diagnosis Date  . Abdominal pain, left lower quadrant 03/27/2014  . Abnormal CXR 05/03/2019   05/03/2019   Walked RA  2 laps @  approx 233ft each @ fast pace  stopped due to  End of study with sats 95% at end    . Abnormal Pap smear   . Achalasia    esphog stretched several times  . Acute pain of both knees 09/07/2018  . Allergy    seasonal allergies  . Anxiety    on meds  . 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 Asthma    rarely uses inhaler-PRN-stopped smoking-symptoms decreased  . ASTHMA 10/09/2008   Spirometry wnl  08/08/14 with  active symptoms attributed to VCD    . Blood transfusion 01/2006   [facility])   . Cardiomyopathy, idiopathic ([place]) 01/09/2014  . CHF (congestive heart failure) ([place]) 06/2020   on meds  . Chronic bilateral low back pain 09/07/2018  . 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 01/18/2018  . Chronically on opiate therapy 01/18/2018  . Cigarette smoker 08/09/2014  . Constipation due to opioid therapy   . Depression   . DEPRESSION 10/09/2008   Qualifier: Diagnosis of  By: Tilden Dome  -on meds  . Dysphagia 08/10/2009   Qualifier: Diagnosis of  By: Shane Crutch, Amy S   . Dyspnea 08/08/2014   Followed in Pulmonary clinic/ [facility]/ [facility]  - 08/08/2014  Walked RA x 3 laps @ 185 ft each stopped due to  End of study, no sob or desat - Spirometry 08/08/14 wnl    . Dysrhythmia   . Esophageal reflux 11/30/2008   Centricity Description: ESOPHAGEAL REFLUX Qualifier: Diagnosis of  By: Deatra Ina MD, Sandy Salaam  Centricity Description: GERD Qualifier: Diagnosis of  By: Shane Crutch, Amy S   . ESOPHAGEAL STRICTURE 08/10/2009   Qualifier: Diagnosis of  By: Shane Crutch, Amy S   . Excessive daytime sleepiness 01/18/2018  . Fibromyalgia    on meds  . Gallstones and inflammation of gallbladder without obstruction 01/23/2015  . GERD (gastroesophageal reflux disease)    on meds - diet controlled  . Globus sensation   . 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 Heart murmur   . Heart palpitations    tx with atenolol by steven klein   . Hiatal hernia   . History of abnormal Pap smear   . History of chlamydia 1990's  . 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  . HSV-2 infection   . HTN (hypertension)    on meds  . 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 Occipital neuralgia of right side 04/21/2017  . Palpitation 04/11/2011  . 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 Pneumonia 05/2013  . PVC's (premature ventricular contractions)   . S/P laparoscopic surgery 01/23/2015  . Shortness of  breath dyspnea   . 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 Status post dilation of esophageal narrowing   . Unspecified constipation 11/30/2008   Centricity Description: UNSPECIFIED CONSTIPATION Qualifier: Diagnosis of  By: Deatra Ina MD, Sandy Salaam  Centricity Description: CONSTIPATION Qualifier: Diagnosis of  By: Shane Crutch, Amy S   . Upper airway cough syndrome 09/05/2014   Followed in Pulmonary clinic/ [facility]/ [facility]  Onset  2002 p tonsilectomy  -  Spirometry wnl  08/08/14  - referred to [place] [facility] at [facility] 09/05/2014 > inconclusive w/u by ENT / GI s f/u ov by either since 2016 > referred  Back to Dr Joya Gaskins 10/20/2017  - Dg Es 01/27/2019  Spontaneous gastroesophageal reflux. - 05/03/2019 pulmonary re-eval = ppi bid ac/ elevate hob and 1st gen H1 blockers p  . UTI (urinary tract infection) 02/12/2017   E Coli Rx Septra DS Unable to reach patient, busy signal  . Vaginal Pap smear, abnormal   . Vivid dream 01/18/2018  . Vocal cord paralysis   . 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 rate 16, height 5\' 9"  (1.753 m), weight 191 lb 6.4 oz (86.8 kg), last menstrual period 02/17/2021, SpO2 99 %.  CHF (congestive heart failure) ([place]) Patient with HFrEF as well as difficulty with swallowing.  Would like to have her on GDMT.  Will add lisinopril 5 mg once daily, as this is safe to crush.  We will see her back in 4 weeks for follow up, at which time we can hopefully add spironolactone and a SGLT-2 inhibitor.  Will need to check with manufacturer to determine if any evidence of crushing Entresto, but for now will just use lisinopril.     Tommy Medal PharmD CPP [facility] [address] [place], [place] [address] [phone]

## 2021-03-27 ENCOUNTER — Encounter: Payer: Self-pay | Admitting: Pharmacist Clinician (PhC)/ Clinical Pharmacy Specialist

## 2021-03-27 NOTE — Assessment & Plan Note (Signed)
Patient with HFrEF as well as difficulty with swallowing.  Would like to have her on GDMT.  Will add lisinopril 5 mg once daily, as this is safe to crush.  We will see her back in 4 weeks for follow up, at which time we can hopefully add spironolactone and a SGLT-2 inhibitor.  Will need to check with manufacturer to determine if any evidence of crushing Entresto, but for now will just use lisinopril.

## 2021-04-23 ENCOUNTER — Other Ambulatory Visit: Payer: Self-pay

## 2021-04-23 ENCOUNTER — Ambulatory Visit (INDEPENDENT_AMBULATORY_CARE_PROVIDER_SITE_OTHER): Payer: Medicare HMO | Admitting: Pharmacist Clinician (PhC)/ Clinical Pharmacy Specialist

## 2021-04-23 DIAGNOSIS — I509 Heart failure, unspecified: Secondary | ICD-10-CM

## 2021-04-23 MED ORDER — SPIRONOLACTONE 25 MG PO TABS: 12.5000 mg | ORAL_TABLET | ORAL | 3 refills | Status: DC

## 2021-04-23 NOTE — Patient Instructions (Signed)
Return for a a follow up appointment May 20 with Dr. Margaretann Loveless  Go to the lab Monday to check potassium and kidney function  Check your blood pressure at home daily (if able) and keep record of the readings.  Take your BP meds as follows:  Start lisinopril 5 mg each evening  Start spironolactone 12.5 mg (1/2 tablet) every other day  Continue with all other medications  Bring all of your meds, your BP cuff and your record of home blood pressures to your next appointment.  Exercise as you're able, try to walk approximately 30 minutes per day.  Keep salt intake to a minimum, especially watch canned and prepared boxed foods.  Eat more fresh fruits and vegetables and fewer canned items.  Avoid eating in fast food restaurants.    HOW TO TAKE YOUR BLOOD PRESSURE: . Rest 5 minutes before taking your blood pressure. .  Don't smoke or drink caffeinated beverages for at least 30 minutes before. . Take your blood pressure before (not after) you eat. . Sit comfortably with your back supported and both feet on the floor (don't cross your legs). . Elevate your arm to heart level on a table or a desk. . Use the proper sized cuff. It should fit smoothly and snugly around your bare upper arm. There should be enough room to slip a fingertip under the cuff. The bottom edge of the cuff should be 1 inch above the crease of the elbow. . Ideally, take 3 measurements at one sitting and record the average.

## 2021-04-23 NOTE — Progress Notes (Signed)
04/23/2021 Raven [name] [birth year] [id number]   HPI:  Raven [name] is a 40 y.o. female patient of Dr Margaretann Loveless, with a PMH below who presents today for heart failure medication titration.  In addition to heart failure (EF 30-35%), her medical history is notable for esophageal stricture.  Because of this, she has difficulty swallowing medications and needs products that can be safely crushed.  Patient was seen by Dr. Margaretann Loveless last month at which time she was started on metoprolol tart 12.5 mg bid for her HFrEF as well as PVC's and palpitations.  An MRI done just a few weeks ago showed her EF to be at 34% with global hypokinesis.   She is here today to work on getting GDMT in forms that are safe to crush.  At her last visit we started her on lisinopril 5 mg daily.   Today she returns for follow up.  She did not start the lisinopril 5 mg from her last visit, as she was worried that it would hurt her kidneys.  She has been in a rush this morning, trying to get to [facility] on time, and she believes this is why her pressure is elevated today.    BP today elevated - had to see the principal at her kids' school [employer]/medicaid/medicare - has hme BP cuff, home readings good.     Blood Pressure Goal:  130/80  Current Medications: metoprolol 12.5 mg bid  Family Hx: mother had PAD, leg amputation 2 yrs ago, also has first MI at 52 and hypertension - now late 44's; dad in his 71's - hypertension; 3 sisters - none with CV issues  Social Hx: smoking black cigars - 2x per week; no alcohol except rare holidays/events; no caffeine  Diet: drinks water, juice; rare soda; mostly home cooked meals, grinds/processes food to eat; not many meats; weight dropped to < 190 since throat issues; uses beans and eggs for protein; raw veggies/salad  Exercise: joined silver sneakers - mostly walking/biking; too much casuse palpitiations  Home BP readings:  Now has home meter from [place] - states home readings mostly  326-712'W systolic.  No readings < 580 systolic.    Intolerances: no cardiac medication intolerances  Labs: 8/21  Na 142, K 4.8, Glu 96, BUN 12, SCr 0.95 GFR 87   Wt Readings from Last 3 Encounters:  04/23/21 190 lb 3.2 oz (86.3 kg)  03/21/21 191 lb 6.4 oz (86.8 kg)  02/01/21 196 lb (88.9 kg)   BP Readings from Last 3 Encounters:  04/23/21 (!) 144/84  03/21/21 108/72  02/01/21 134/82   Pulse Readings from Last 3 Encounters:  04/23/21 98  03/21/21 (!) 108  02/01/21 85    Current Outpatient Medications  Medication Sig Dispense Refill  . dicyclomine (BENTYL) 10 MG capsule Take 1-2 every 8 hours as needed (Patient taking differently: Take 10-20 mg by mouth every 8 (eight) hours as needed for spasms.) 90 capsule 3  . FLUoxetine (PROZAC) 20 MG/5ML solution Take 20 mg by mouth daily.   3  . furosemide (LASIX) 40 MG tablet Take 1 tablet (40 mg total) by mouth daily. 30 tablet 3  . hydrOXYzine (ATARAX/VISTARIL) 50 MG tablet Take 50 mg by mouth every 6 (six) hours as needed for anxiety.   0  . LATUDA 60 MG TABS Take 60 mg by mouth daily.   0  . linaclotide (LINZESS) 290 MCG CAPS capsule Take 1 capsule (290 mcg total) by mouth daily before breakfast. 90  capsule 3  . LORazepam (ATIVAN) 2 MG tablet Take 2 mg by mouth 3 (three) times daily.    . metoprolol tartrate (LOPRESSOR) 25 MG tablet Take 0.5 tablets (12.5 mg total) by mouth 2 (two) times daily. 30 tablet 3  . oxyCODONE-acetaminophen (PERCOCET) 10-325 MG tablet Take 1 tablet by mouth every 4 (four) hours.  0  . pantoprazole sodium (PROTONIX) 40 mg/20 mL PACK Take 20 mLs (40 mg total) by mouth daily. 600 mL 11  . spironolactone (ALDACTONE) 25 MG tablet Take 0.5 tablets (12.5 mg total) by mouth every other day. 15 tablet 3  . lisinopril (ZESTRIL) 5 MG tablet Take 1 tablet (5 mg total) by mouth daily. (Patient not taking: Reported on 04/23/2021) 30 tablet 6   No current facility-administered medications for this visit.    Allergies   Allergen Reactions  . Dust Mite Extract Shortness Of Breath  . Other Shortness Of Breath    Feathers, trees "almost everything outside"  . Pollen Extract Shortness Of Breath    Past Medical History:  Diagnosis Date  . Abdominal pain, left lower quadrant 03/27/2014  . Abnormal CXR 05/03/2019   05/03/2019   Walked RA  2 laps @  approx 238ft each @ fast pace  stopped due to  End of study with sats 95% at end    . Abnormal Pap smear   . Achalasia    esphog stretched several times  . Acute pain of both knees 09/07/2018  . Allergy    seasonal allergies  . Anxiety    on meds  . 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 Asthma    rarely uses inhaler-PRN-stopped smoking-symptoms decreased  . ASTHMA 10/09/2008   Spirometry wnl  08/08/14 with active symptoms attributed to VCD    . Blood transfusion 01/2006   [facility])   . Cardiomyopathy, idiopathic ([place]) 01/09/2014  . CHF (congestive heart failure) ([place]) 06/2020   on meds  . Chronic bilateral low back pain 09/07/2018  . 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 01/18/2018  . Chronically on opiate therapy 01/18/2018  . Cigarette smoker 08/09/2014  . Constipation due to opioid therapy   . Depression   . DEPRESSION 10/09/2008   Qualifier: Diagnosis of  By: Tilden Dome  -on meds  . Dysphagia 08/10/2009   Qualifier: Diagnosis of  By: Shane Crutch, Amy S   . Dyspnea 08/08/2014   Followed in Pulmonary clinic/ [facility]/ [facility]  - 08/08/2014  Walked RA x 3 laps @ 185 ft each stopped due to  End of study, no sob or desat - Spirometry 08/08/14 wnl    . Dysrhythmia   . Esophageal reflux 11/30/2008   Centricity Description: ESOPHAGEAL REFLUX Qualifier: Diagnosis of  By: Deatra Ina MD, Sandy Salaam  Centricity Description: GERD Qualifier: Diagnosis of  By: Shane Crutch, Amy S   . ESOPHAGEAL STRICTURE 08/10/2009   Qualifier: Diagnosis of  By: Shane Crutch, Amy S   . Excessive daytime sleepiness 01/18/2018  . Fibromyalgia    on  meds  . Gallstones and inflammation of gallbladder without obstruction 01/23/2015  . GERD (gastroesophageal reflux disease)    on meds - diet controlled  . Globus sensation   . 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 Heart murmur   . Heart palpitations    tx with atenolol by steven klein   . Hiatal hernia   . History of abnormal Pap smear   . History of chlamydia 1990's  .  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  . HSV-2 infection   . HTN (hypertension)    on meds  . 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 Occipital neuralgia of right side 04/21/2017  . Palpitation 04/11/2011  . 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 Pneumonia 05/2013  . PVC's (premature ventricular contractions)   . S/P laparoscopic surgery 01/23/2015  . Shortness of breath dyspnea   . 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 Status post dilation of esophageal narrowing   . Unspecified constipation 11/30/2008   Centricity Description: UNSPECIFIED CONSTIPATION Qualifier: Diagnosis of  By: Deatra Ina MD, Sandy Salaam  Centricity Description: CONSTIPATION Qualifier: Diagnosis of  By: Shane Crutch, Amy S   . Upper airway cough syndrome 09/05/2014   Followed in Pulmonary clinic/ [facility]/ [facility]  Onset  2002 p tonsilectomy  -Spirometry wnl  08/08/14  - referred to [place] [facility] at [facility] 09/05/2014 > inconclusive w/u by ENT / GI s f/u ov by either since 2016 > referred  Back to Dr Joya Gaskins 10/20/2017  - Dg Es 01/27/2019  Spontaneous gastroesophageal reflux. - 05/03/2019 pulmonary re-eval = ppi bid ac/ elevate hob and 1st gen H1 blockers p  . UTI (urinary tract infection) 02/12/2017   E Coli Rx Septra DS Unable to reach patient, busy signal  . Vaginal Pap smear, abnormal   . Vivid dream 01/18/2018  . Vocal cord paralysis   . 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 rate 17, height 5\' 9"  (1.753 m), weight 190 lb 3.2 oz (86.3 kg), last menstrual  period 04/23/2021, SpO2 99 %.  CHF (congestive heart failure) ([place]) Patient with HFrEF, currently only on metoprolol and prn furosemide.  Had a discussion today about her concerns for kidney damage if taking lisinopril.  Explained the benefits to patients with HF as well as monitoring labs regularly to be sure no kidney disease arises.  She is eager to be on GDMT, but still hesitant about taking medications.  Will have her start the lisinopril today, and also add in spironolactone 12.5 mg every other day.  Suspect that she would do fine from a BP standpoint with daily dosing, but hopefully easing her to that will make her more comfortable with taking meds.   She will need to repeat BMET on Monday and she understands that this to to ensure her kidneys are safe.  Still would like to start her on SGLT-2 inhibitor, but will wait until next month when she sees Dr. Margaretann Loveless.  Patient agreeable to plan and all questions answered.    Tommy Medal PharmD CPP [facility] [address] [place], [place] [address] [phone]

## 2021-04-23 NOTE — Assessment & Plan Note (Signed)
Patient with HFrEF, currently only on metoprolol and prn furosemide.  Had a discussion today about her concerns for kidney damage if taking lisinopril.  Explained the benefits to patients with HF as well as monitoring labs regularly to be sure no kidney disease arises.  She is eager to be on GDMT, but still hesitant about taking medications.  Will have her start the lisinopril today, and also add in spironolactone 12.5 mg every other day.  Suspect that she would do fine from a BP standpoint with daily dosing, but hopefully easing her to that will make her more comfortable with taking meds.   She will need to repeat BMET on Monday and she understands that this to to ensure her kidneys are safe.  Still would like to start her on SGLT-2 inhibitor, but will wait until next month when she sees Dr. Margaretann Loveless.  Patient agreeable to plan and all questions answered.

## 2021-05-17 ENCOUNTER — Ambulatory Visit: Payer: Medicare HMO | Admitting: Internal Medicine

## 2021-05-17 NOTE — Progress Notes (Incomplete)
Cardiology Office Note:    Date:  05/17/2021   ID:  Raven [name], DOB 1981-12-27, MRN [id number]  PCP:  Lin Landsman, MD  Cardiologist:  Elouise Munroe, MD  Electrophysiologist:  None   Referring MD: Lin Landsman, MD   Chief Complaint/Reason for Referral: NICM  History of Present Illness:    Raven [name] is a 40 y.o. female with a history of symptomatic PVCs, anxiety, HTN and nonischemic cardiomyopathy who presents for further evaluation of HTN, palpitations, and follow up of nonischemic cardiomyopathy.   Due to symptom of dysphagia and inability to swallow metoprolol, this medication was later changed to bisoprolol 2.5 mg daily.   Palpitations worsening. Not taking bisoprolol.   Will try metoprolol tartrate 12.5 mg BID.   05/17/2021:  She denies any chest pain, shortness of breath, palpitations, or exertional symptoms. No headaches, lightheadedness, or syncope to report. Also has no lower extremity edema, orthopnea or PND.   Past Medical History:  Diagnosis Date   Abdominal pain, left lower quadrant 03/27/2014   Abnormal CXR 05/03/2019   05/03/2019   Walked RA  2 laps @  approx 272ft each @ fast pace  stopped due to  End of study with sats 95% at end     Abnormal Pap smear    Achalasia    esphog stretched several times   Acute pain of both knees 09/07/2018   Allergy    seasonal allergies   Anxiety    on meds   Arthritis    generalized-on meds   Asthma    rarely uses inhaler-PRN-stopped smoking-symptoms decreased   ASTHMA 10/09/2008   Spirometry wnl  08/08/14 with active symptoms attributed to [place]     Blood transfusion 01/2006   [facility]   Cardiomyopathy [facility])    Cardiomyopathy, idiopathic ([place]) 01/09/2014   CHF (congestive heart failure) ([place]) 06/2020   on meds   Chronic bilateral low back pain 09/07/2018   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 01/18/2018   Chronically on opiate therapy 01/18/2018   Cigarette smoker 08/09/2014    Constipation due to opioid therapy    Depression    DEPRESSION 10/09/2008   Qualifier: Diagnosis of  By: Tilden Dome  -on meds   Dysphagia 08/10/2009   Qualifier: Diagnosis of  By: Shane Crutch, Amy S    Dyspnea 08/08/2014   Followed in Pulmonary clinic/ [facility]/ [facility]  - 08/08/2014  Walked RA x 3 laps @ 185 ft each stopped due to  End of study, no sob or desat - Spirometry 08/08/14 wnl     Dysrhythmia    Esophageal reflux 11/30/2008   Centricity Description: ESOPHAGEAL REFLUX Qualifier: Diagnosis of  By: Deatra Ina MD, Sandy Salaam  Centricity Description: GERD Qualifier: Diagnosis of  By: Shane Crutch, Amy S    ESOPHAGEAL STRICTURE 08/10/2009   Qualifier: Diagnosis of  By: Shane Crutch, Amy S    Excessive daytime sleepiness 01/18/2018   Fibromyalgia    on meds   Gallstones and inflammation of gallbladder without obstruction 01/23/2015   GERD (gastroesophageal reflux disease)    on meds - diet controlled   Globus sensation    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   Heart murmur    Heart palpitations    tx with atenolol by steven klein    Hiatal hernia    History of abnormal Pap smear    History of chlamydia 1990's   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   HSV-2 infection    HTN (hypertension)  on meds   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   Occipital neuralgia of right side 04/21/2017   Palpitation 04/11/2011   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   Pneumonia 05/2013   PVC's (premature ventricular contractions)    S/P laparoscopic surgery 01/23/2015   Shortness of breath dyspnea    Sleep disorder, circadian, 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   Status post dilation of esophageal narrowing    Unspecified constipation 11/30/2008   Centricity Description: UNSPECIFIED CONSTIPATION Qualifier: Diagnosis of  By: Deatra Ina MD, Sandy Salaam  Centricity Description: CONSTIPATION Qualifier: Diagnosis of  By: Shane Crutch, Amy S     Upper airway cough syndrome 09/05/2014   Followed in Pulmonary clinic/ [facility]/ [facility]  Onset  2002 p tonsilectomy  -Spirometry wnl  08/08/14  - referred to [place] [facility] at [facility] 09/05/2014 > inconclusive w/u by ENT / GI s f/u ov by either since 2016 > referred  Back to Dr Joya Gaskins 10/20/2017  - Dg Es 01/27/2019  Spontaneous gastroesophageal reflux. 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    Past Surgical History:  Procedure Laterality Date   ADENOIDECTOMY  12/2002   BLADDER SUSPENSION  06/11/2012   Procedure: TRANSVAGINAL TAPE (TVT) PROCEDURE;  Surgeon: Delice Lesch, MD;  Location: [facility];  Service: Gynecology;  Laterality: N/A;  Tension Free Vaginal Tape/cystoscopy   BREAST SURGERY  07/2001   tumor removed right breast   CHOLECYSTECTOMY N/A 01/23/2015   Procedure: LAPAROSCOPIC CHOLECYSTECTOMY ;  Surgeon: Fanny Skates, MD;  Location: [facility];  Service: General;  Laterality: N/A;  case booked with cholangiogram but MD did not complete intraoperative cholangiogram    COLONOSCOPY  2020   1 polyp/TICS   CYSTOSCOPY  06/11/2012   Procedure: CYSTOSCOPY;  Surgeon: Delice Lesch, MD;  Location: [facility];  Service: Gynecology;  Laterality: N/A;   DILATION AND CURETTAGE OF UTERUS  1999   DILATION AND CURETTAGE OF UTERUS  01/2006   retained products   ESOPHAGEAL MANOMETRY N/A 02/23/2019   Procedure: ESOPHAGEAL MANOMETRY (EM);  Surgeon: Mauri Pole, MD;  Location: [facility];  Service: Endoscopy;  Laterality: N/A;   ESOPHAGOGASTRODUODENOSCOPY     with esophageal dilation x 3   ESOPHAGOGASTRODUODENOSCOPY (EGD) WITH PROPOFOL N/A 09/25/2020   Procedure: ESOPHAGOGASTRODUODENOSCOPY (EGD) WITH PROPOFOL;  Surgeon: Yetta Flock, MD;  Location: [facility];  Service: Gastroenterology;  Laterality: N/A;   FOREIGN BODY REMOVAL  06/11/2012   Procedure: REMOVAL FOREIGN BODY EXTREMITY;  Surgeon: Delice Lesch, MD;  Location: [facility];  Service: Gynecology;  Laterality: Right;  Removal of Implanon    LAPAROSCOPIC TUBAL LIGATION  06/11/2012   Procedure: LAPAROSCOPIC TUBAL LIGATION;  Surgeon: Delice Lesch, MD;  Location: [facility];  Service: Gynecology;  Laterality: Bilateral;  Laparoscopic Bilateral Fulgeration   POLYPECTOMY  2020   tics/1 polyp   SAVORY DILATION N/A 09/25/2020   Procedure: SAVORY DILATION;  Surgeon: Yetta Flock, MD;  Location: [facility];  Service: Gastroenterology;  Laterality: N/A;   TONSILLECTOMY      Current Medications: No outpatient medications have been marked as taking for the 05/17/21 encounter (Appointment) with Elouise Munroe, MD.  Allergies:   Dust mite extract, Other, and Pollen extract   Social History   Tobacco Use   Smoking status: Former Smoker    Packs/day: 0.25    Years: 15.00    Pack years: 3.75    Types: Cigarettes    Quit date: 04/29/2019    Years since quitting: 2.0   Smokeless tobacco: Never Used   Tobacco comment: stopped smoking 04-29-2019  Vaping Use   Vaping Use: Never used  Substance Use Topics   Alcohol use: No    Comment: special occassions   Drug use: No     Family History: The patient's family history includes Arthritis in her mother and sister; Asthma in her mother and sister; Breast cancer in her mother; Colon cancer (age of onset: 48) in her paternal uncle; Colon polyps (age of onset: 51) in her father and paternal uncle; Heart attack in her mother; Heart disease in her father; Hypertension in her father and mother; Neuropathy in her mother and sister; Peripheral Artery Disease (age of onset: 38) in her mother; Stomach cancer (age of onset: 76) in her paternal uncle; Stroke in her mother. There is no history of Esophageal  cancer or Rectal cancer.  ROS:   Please see the history of present illness.    (+) All other systems reviewed and are negative.  EKGs/Labs/Other Studies Reviewed:    The following studies were reviewed today:  MR Cardiac Morphology 03/11/2021: 1. Severely decreased left ventricular function. Normal LV size by indexed volume. LVEF 34%.  2.  Normal right ventricular size and function.  RVEF 51%.  3. Mid-myocardial stripe of delayed enhancement in left ventricular septum, anterior and inferior walls, nonspecific finding. No myocardial edema.  4. ECV 30%, mildly increased, may be seen in hypertensive heart disease.  Findings consistent with nonischemic cardiomyopathy.  RECOMMENDATIONS: Guideline directed medical therapy for heart failure with reduced ejection fraction.  EKG:   05/17/2021: *** 02/01/2021: NSR, poor R wave progression    Recent Labs: 06/15/2020: ALT 27; Hemoglobin 13.9; Platelets 239 08/17/2020: BUN 12; Creatinine, Ser 0.95; Magnesium 2.3; Potassium 4.8; Sodium 142  Recent Lipid Panel No results found for: CHOL, TRIG, HDL, CHOLHDL, VLDL, LDLCALC, LDLDIRECT  Physical Exam:    VS:  LMP 04/23/2021     Wt Readings from Last 5 Encounters:  04/23/21 190 lb 3.2 oz (86.3 kg)  03/21/21 191 lb 6.4 oz (86.8 kg)  02/01/21 196 lb (88.9 kg)  09/25/20 193 lb 9 oz (87.8 kg)  09/11/20 193 lb 9.6 oz (87.8 kg)    Constitutional: No acute distress Eyes: sclera non-icteric, normal conjunctiva and lids ENMT: normal dentition, moist mucous membranes Cardiovascular: regular rhythm, normal rate, no murmurs. S1 and S2 normal. Radial pulses normal bilaterally. No jugular venous distention.  Respiratory: clear to auscultation bilaterally GI : normal bowel sounds, soft and nontender. No distention.   MSK: extremities warm, well perfused. No edema.  NEURO: grossly nonfocal exam, moves all extremities. PSYCH: alert and oriented x 3, normal mood and affect.   ASSESSMENT:     No diagnosis found. PLAN:    No diagnosis found.   - has not been able to tolerate GDMT for reduced EF. Will trial lopressor for palpitations. If symptoms improve we will plan for CMR for HF assessment in 6 weeks.   Lives in [place], will plan for follow up with Dr. Harriet Masson for convenience and [facility] expertise.   Total time of encounter: *** minutes total time of encounter, including *** minutes spent  in face-to-face patient care on the date of this encounter. This time includes coordination of care and counseling regarding above mentioned problem list. Remainder of non-face-to-face time involved reviewing chart documents/testing relevant to the patient encounter and documentation in the medical record. I have independently reviewed documentation from referring provider.   Cherlynn Kaiser, MD    [facility]    Medication Adjustments/Labs and Tests Ordered: Current medicines are reviewed at length with the patient today.  Concerns regarding medicines are outlined above.   No orders of the defined types were placed in this encounter.    No orders of the defined types were placed in this encounter.   There are no Patient Instructions on file for this visit.    I,Mathew Stumpf,acting as a Education administrator for Elouise Munroe, MD.,have documented all relevant documentation on the behalf of Elouise Munroe, MD,as directed by  Elouise Munroe, MD while in the presence of Elouise Munroe, MD.  ***

## 2021-07-08 ENCOUNTER — Telehealth: Payer: Self-pay | Admitting: Gastroenterology

## 2021-07-08 NOTE — Telephone Encounter (Signed)
Inbound call requesting a call back regarding a medication refill. Please advise. Thanks

## 2021-07-09 NOTE — Telephone Encounter (Signed)
Returned patient's call and LM to call back. If I am not available when she calls back Patient needs to leave a detailed message regarding what medication she is calling about and what her needs are regarding that medication.

## 2022-01-01 ENCOUNTER — Other Ambulatory Visit: Payer: Self-pay | Admitting: Internal Medicine

## 2022-01-05 ENCOUNTER — Other Ambulatory Visit: Payer: Self-pay | Admitting: Internal Medicine

## 2022-01-14 ENCOUNTER — Other Ambulatory Visit (INDEPENDENT_AMBULATORY_CARE_PROVIDER_SITE_OTHER): Payer: Medicare HMO

## 2022-01-14 ENCOUNTER — Ambulatory Visit (INDEPENDENT_AMBULATORY_CARE_PROVIDER_SITE_OTHER): Payer: Medicare HMO | Admitting: Nurse Practitioner

## 2022-01-14 VITALS — BP 130/80 | HR 101 | Ht 69.0 in | Wt 191.0 lb

## 2022-01-14 DIAGNOSIS — R1311 Dysphagia, oral phase: Secondary | ICD-10-CM | POA: Diagnosis not present

## 2022-01-14 DIAGNOSIS — R1084 Generalized abdominal pain: Secondary | ICD-10-CM

## 2022-01-14 DIAGNOSIS — K5909 Other constipation: Secondary | ICD-10-CM | POA: Diagnosis not present

## 2022-01-14 LAB — COMPREHENSIVE METABOLIC PANEL
ALT: 22 U/L (ref 0–35)
AST: 17 U/L (ref 0–37)
Albumin: 4.4 g/dL (ref 3.5–5.2)
Alkaline Phosphatase: 55 U/L (ref 39–117)
BUN: 9 mg/dL (ref 6–23)
CO2: 25 mEq/L (ref 19–32)
Calcium: 9.1 mg/dL (ref 8.4–10.5)
Chloride: 106 mEq/L (ref 96–112)
Creatinine, Ser: 0.77 mg/dL (ref 0.40–1.20)
GFR: 95.79 mL/min (ref >60.00)
Glucose, Bld: 109 mg/dL — ABNORMAL HIGH (ref 70–99)
Potassium: 4.1 mEq/L (ref 3.5–5.1)
Sodium: 139 mEq/L (ref 135–145)
Total Bilirubin: 0.6 mg/dL (ref 0.2–1.2)
Total Protein: 7.3 g/dL (ref 6.0–8.3)

## 2022-01-14 LAB — CBC
HCT: 40.7 % (ref 36.0–46.0)
Hemoglobin: 13.4 g/dL (ref 12.0–15.0)
MCHC: 32.8 g/dL (ref 30.0–36.0)
MCV: 92.1 fl (ref 78.0–100.0)
Platelets: 202 10*3/uL (ref 150.0–400.0)
RBC: 4.42 Mil/uL (ref 3.87–5.11)
RDW: 13.3 % (ref 11.5–15.5)
WBC: 8.2 10*3/uL (ref 4.0–10.5)

## 2022-01-14 LAB — C-REACTIVE PROTEIN: CRP: <1 mg/dL (ref 0.5–20.0)

## 2022-01-14 LAB — CK: Total CK: 101 U/L (ref 7–177)

## 2022-01-14 LAB — SEDIMENTATION RATE: Sed Rate: 7 mm/hr (ref 0–20)

## 2022-01-14 MED ORDER — LINACLOTIDE 290 MCG PO CAPS: 290.0000 ug | ORAL_CAPSULE | Freq: Every day | ORAL | 1 refills | Status: AC

## 2022-01-14 MED ORDER — PANTOPRAZOLE SODIUM 40 MG PO PACK: 40.0000 mg | PACK | Freq: Every day | ORAL | 1 refills | Status: DC

## 2022-01-14 MED ORDER — DICYCLOMINE HCL 10 MG PO CAPS: 10.0000 mg | ORAL_CAPSULE | Freq: Three times a day (TID) | ORAL | 1 refills | Status: DC

## 2022-01-14 NOTE — Progress Notes (Addendum)
01/14/2022 TELSA DILLAVOU [id number] [birth year]   Chief Complaint: Constipation, rectal bleeding, difficulty swallowing  History of Present Illness: Raven [name]. [place] is a 41 year old female with a past medical history of fibromyalgia, asthma, nonischemic cardiomyopathy, CHF, PVCs, GERD, chronic oropharyngeal dysphagia, tension dysphonia, chronic narcotic use with chronic constipation. She is followed by Dr. Havery Moros.  She presents her office today with complaints of numerous recurrent GI symptoms.  She continues to have dysphagia on a daily basis.  She describes feeling as if food gets stuck in a pocket in her throat which can remain stuck for an entire day results and pain with swallowing.  She also describes throat tightness/fullness which makes it difficult to swallow at times but does not occur daily.  She often has a foul taste in her mouth and bad breath.  She stopped taking pantoprazole 1 year ago when she ran out of this prescription.  No heartburn.  Her most recent EGD was 09/29/2019, a subtle stenosis at the UES was dilated, the esophagus was dilated to 18 mm.  She stated her dysphagia symptoms improved 6 months post EGD with dilatation then recurred and gradually worsened over the past year. She saw ENT Dr. Thedore Mins with [facility] 11/13/2021 who completed a laryngoscopy which showed she likely had muscle tension dysphagia without evidence of vocal cordy paralysis. She stated she is scheduled to have a video swallow study while eating solid food in [place] on 01/27/2021.  She continues to have voice dysphonia.  Today, her voice seems clear and audible but she stated she has days when her voice is hoarse which results in speaking in a whisper type tone. She has undergone extensive dysphagia evaluation as previously reviewed by Dr. Havery Moros.  See past endoscopic and ENT procedure results below.  Laryngoscopy 11/13/2021: The nasopharynx is clear.  The tongue base, vallecula and  epiglottis are normal. Vocal folds are fully mobile for abduction and adduction. Moderate MTD. No vocal cord nodules, cysts, lesions or masses are seen.  The piriform sinuses are clear. The inner arytenoid area is normal.  ENT lVideo larynostroboscopy on 05/03/20- Salient features from that report include: Severe to profound anterior to posterior compression (left arytenoid touches petiole of epiglottis) during phonation reflective of muscle tension dysphonia/laryngeal spasm, bilateral vocal fold fullness likely secondary to smoking history (mild polypoid degeneration). Her dysphonia and laryngoscopy findings were consistent with tension dysphonia per ENT.  Manometry done again Feb 2020 - normal Barium swallow 01/27/19 - reflux noted, otherwise normal exam   Modified barium swallow 01/26/2018 - normal CT neck 12/10/2017 - normal   Esophageal manometry 02/23/15 - normal, no evidence of dysmotility Barium swallow 10/04/2014 - incomplete clearance of contrast in upper esophagus, absent secondary peristalsis, concern for dysmotility   Colonoscopy 05/10/2014 - 54mm ascending colon polyp, 57mm ascending colon polyp, diverticulosis sigmoid colon - one polyp TA, one polyp HP EGD 04/13/2014 - normal  Barium study 2010 normal    EGD 09/29/19 - Esophagogastric landmarks identified. - Normal esophagus - empiric dilation performed to 92mm with good result, patient appears to have a subtle stenosis at UES which was dilated - Normal stomach. - Normal duodenal bulb and second portion of the duodenum.    EGD 06/08/19 -  - The exam of the esophagus was otherwise normal. - A guidewire was placed and the scope was withdrawn. Empiric dilation was performed in the entire esophagus with a Savary dilator with mild resistance at 17 mm and 18 mm. [place]  endoscopy showed an appropriate mucosal wrent at the proximal esophagus / UES, suspect subtle stenosis there was causing dysphagia. Biopsies were taken with a cold forceps  in the upper third of the esophagus, in the middle third of the esophagus and in the lower third of the esophagus for histology, rule out EoE. - The entire examined stomach was normal. - The duodenal bulb and second portion of the duodenum were normal.  Path benign    She also complains of constipation with generalized abdominal pain, gas and bloat.  She sometimes has a throbbing pain to the center of her abdomen.  Her constipation was previously well controlled with Linzess, ever, she ran out of her Linzess prescription 1 year ago.  She is passing a hard brown stool twice weekly.  She feels pressure in her lower abdomen with intermittent sharp rectal pain over the past few months.  She denies using any over-the-counter laxatives.  She occasionally sees a spot of bright red blood on the stool or on the toilet tissue after straining.  Her most recent colonoscopy was 06/08/2019: See results below.  Colonoscopy 06/08/19 - Hemorrhoids were found on perianal exam. - A 3 mm polyp was found in the cecum. The polyp was sessile. The polyp was removed with a cold snare. Resection and retrieval were complete. - Multiple small-mouthed diverticula were found in the ascending colon and left colon. - Internal hemorrhoids were found during retroflexion. - The exam was otherwise without abnormality.  Path adenoma - repeat colonoscopy in 7 years  Colonoscopy 05/10/2014 - 33mm ascending colon polyp, 93mm ascending colon polyp, diverticulosis sigmoid colon - one polyp TA, one polyp HP  She continues to have generalized body aches.  Specifically worse to her arms and legs for which she takes Oxycodone 4 times daily as prescribed by her pain specialist.  She complains of  having swelling to both breasts with lateral nipple discharge for the past few months.  Her last menstrual cycle was 01/27/2022.  She has not seen her gynecologist for more than 1 year.  She is in the process of searching for a new gynecologist as her  prior GYN is no longer accepting her insurance.  No history of prolactinoma.  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  Past Medical History:  Diagnosis Date   Abdominal pain, left lower quadrant 03/27/2014   Abnormal CXR 05/03/2019   05/03/2019   Walked RA  2 laps @  approx 211ft each @ fast pace  stopped due to  End of study with sats 95% at end     Abnormal Pap smear    Achalasia    esphog stretched several times   Acute pain of both knees 09/07/2018   Allergy    seasonal allergies   Anxiety    on meds   Arthritis    generalized-on meds   Asthma    rarely uses inhaler-PRN-stopped smoking-symptoms decreased   ASTHMA 10/09/2008   Spirometry wnl  08/08/14 with active symptoms attributed to [place]     Blood transfusion 01/2006   [facility]   Cardiomyopathy [facility])    Cardiomyopathy, idiopathic ([place]) 01/09/2014   CHF (congestive heart failure) ([place]) 06/2020   on meds   Chronic bilateral low back pain 09/07/2018   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 01/18/2018   Chronically on opiate therapy 01/18/2018   Cigarette smoker 08/09/2014   Constipation due  to opioid therapy    Depression    DEPRESSION 10/09/2008   Qualifier: Diagnosis of  By: Tilden Dome  -on meds   Dysphagia 08/10/2009   Qualifier: Diagnosis of  By: Shane Crutch, Amy S    Dyspnea 08/08/2014   Followed in Pulmonary clinic/ [facility]/ [place]  - 08/08/2014  Walked RA x 3 laps @ 185 ft each stopped due to  End of study, no sob or desat - Spirometry 08/08/14 wnl     Dysrhythmia    Esophageal reflux 11/30/2008   Centricity Description: ESOPHAGEAL REFLUX Qualifier: Diagnosis of  By: Deatra Ina MD, Sandy Salaam  Centricity Description: GERD Qualifier: Diagnosis of  By: Shane Crutch, Amy S    ESOPHAGEAL STRICTURE 08/10/2009   Qualifier: Diagnosis of  By: Shane Crutch, Amy S    Excessive  daytime sleepiness 01/18/2018   Fibromyalgia    on meds   Gallstones and inflammation of gallbladder without obstruction 01/23/2015   GERD (gastroesophageal reflux disease)    on meds - diet controlled   Globus sensation    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   Heart murmur    Heart palpitations    tx with atenolol by steven klein    Hiatal hernia    History of abnormal Pap smear    History of chlamydia 1990's   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   HSV-2 infection    HTN (hypertension)    on meds   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   Occipital neuralgia of right side 04/21/2017   Palpitation 04/11/2011   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   Pneumonia 05/2013   PVC's (premature ventricular contractions)    S/P laparoscopic surgery 01/23/2015   Shortness of breath dyspnea    Sleep disorder, circadian, 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   Status post dilation of esophageal narrowing    Unspecified constipation 11/30/2008   Centricity Description: UNSPECIFIED CONSTIPATION Qualifier: Diagnosis of  By: Deatra Ina MD, Sandy Salaam  Centricity Description: CONSTIPATION Qualifier: Diagnosis of  By: Shane Crutch, Amy S    Upper airway cough syndrome 09/05/2014   Followed in Pulmonary clinic/ [facility]/ [place]  Onset  2002 p tonsilectomy  -Spirometry wnl  08/08/14  - referred [facility] 09/05/2014 > inconclusive w/u by ENT / GI s f/u ov by either since 2016 > referred  Back to Dr Joya Gaskins 10/20/2017  - Dg Es 01/27/2019  Spontaneous gastroesophageal reflux. 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   3   furosemide (LASIX) 40 MG tablet Take 1 tablet (40 mg total) by mouth daily. 30 tablet 3   hydrOXYzine (ATARAX/VISTARIL) 50 MG tablet Take 50 mg by mouth every 6 (six) hours as needed for anxiety.   0   LATUDA 60 MG TABS Take 60 mg by mouth daily.   0   lisinopril (ZESTRIL) 5 MG tablet Take 1 tablet (5 mg total) by mouth daily. 30 tablet 6   LORazepam (ATIVAN) 2 MG tablet Take 2  mg by mouth 3 (three) times daily.     metoprolol tartrate (LOPRESSOR) 25 MG tablet Take 0.5 tablets (12.5 mg total) by mouth 2 (two) times daily. 30 tablet 3   oxyCODONE-acetaminophen (PERCOCET) 10-325 MG tablet Take 1 tablet by mouth every 4 (four) hours.  0   spironolactone (ALDACTONE) 25 MG tablet Take 0.5 tablets (12.5 mg total) by mouth every other day. 15 tablet 3   No current facility-administered medications on file prior to visit.   Allergies  Allergen Reactions   Dust Mite Extract Shortness Of Breath   Other Shortness Of Breath    Feathers, trees "almost everything outside"   Pollen Extract Shortness Of Breath   Current Medications, Allergies, Past Medical History, Past Surgical History, Family History and Social History were reviewed in [employer] record.  Review of Systems:   Constitutional: Negative for fever, sweats, chills or weight loss.  Respiratory: Negative for shortness of breath.   Cardiovascular: Negative for chest pain, palpitations and leg swelling.  Gastrointestinal: See HPI.  Musculoskeletal: + Generalized body aches more prevalent to the upper and lower extremities. Neurological: Negative for dizziness, headaches or paresthesias.   Physical Exam: BP 130/80    Pulse (!) 101    Ht 5\' 9"  (1.753 m)    Wt 191 lb (86.6 kg)    BMI 28.21 kg/m  General: 41 year old female in no acute distress. Head: Normocephalic and atraumatic. Eyes: No scleral icterus. Conjunctiva pink . Ears: Normal auditory  acuity. Mouth: Dentition intact. No ulcers or lesions.  Lungs: Clear throughout to auscultation. Heart: Regular rate and rhythm, no murmur. Abdomen: Soft, nondistended.  Mild generalized tenderness without rebound or guarding.  No masses or hepatomegaly. Normal bowel sounds x 4 quadrants.  No bruit. Rectal: No external hemorrhoids.  A small amount of clear drainage to the perianal area.  Internal hemorrhoids palpated without prolapse.  No stool or blood in the rectal vault.  Heather CMA present during exam. Musculoskeletal: Symmetrical with no gross deformities. Extremities: No edema. Neurological: Alert oriented x 4. No focal deficits.  Psychological: Alert and cooperative. Normal mood and affect  Assessment and Recommendations:  53) 41 year old female with oral phase dysphagia +/-cervical esophagus (skeletal muscle) dysphagia. Dysphagia with dysphonia, diagnosed with tension dysphonia per ENT.  No evidence of vocal cord paralysis per recent laryngoscopy.  Her most recent EGD was 09/29/2019, a subtle stenosis at the UES was dilated, the esophagus was empirically dilated to 18 mm.  Her dysphagia symptoms improved for approximately 6 months post dilatation then recurred.  See past EGD, esophageal manometry, swallow study and laryngoscopy results above. -Proceed with video swallow study with ENT in [place] on 01/27/2022 -I will consult with Dr. Havery Moros to determine if a repeat EGD with dilatation is warranted -Rule out Check CK level to rule out polymyositis, CRP/Sed rate to rule out PMR -Restart Pantoprazole 40 mg 1 p.o. daily -If the above evaluation inconclusive, consider neuro evaluation to rule out neuro etiology   2) Chronic constipation in setting of chronic narcotic use.  Linzess previously controlled constipation well, she stopped taking it 1 year ago after her prescription ran out. -Restart Linzess to 90 mg 1 p.o. daily -Fiber diet as tolerated  3) Rectal bleeding associated with  straining/constipation, likely hemorrhoidal -See plan in #2 -Apply a small amount of Desitin inside the anal opening and to the external anal area tid as needed for anal or hemorrhoidal irritation/bleeding.  -CBC  4) Generalized abdominal pain, likely due  to constipation -CBC, CMP -Linzess as ordered above -Gas-X twice daily as needed -Consider CTAP if abdominal pain worsens  5) History of colon polys. Father with history of colon polyps. Paternal uncle diagnosed with colon cancer age 62.  Her most recent colonoscopy 06/08/2019 identified a 3 mm tubular adenomatous polyp removed from the cecum. -Recall colonoscopy 05/2026  6) Bilateral breast swelling with reported nipple discharge -Patient to follow-up with PCP for further evaluation and to obtain GYN referral.  She will need a mammogram, consider brain MRI to rule out prolactinoma.  7) History of ischemic cardiomyopathy, CHF.  LV EF 30 - 35%.  Clinically stable at this time.  Today's encounter was 40 minutes including precharting, result review, obtaining history/exam, face-to-face time used for counseling, formulating treatment plan with follow-up and documentation.  ADDENDUM: Patient elects to schedule an EGD with esophageal dilatation with Dr. Havery Moros.  EGD will be scheduled at [facility] due to LVEF 30 to 35%.

## 2022-01-14 NOTE — Patient Instructions (Signed)
It was great seeing you today! Thank you for entrusting me with your care and choosing [facility].  Noralyn Pick, CRNP  LABS:  Lab work has been ordered for you today. Our lab is located in the basement. Press "B" on the elevator. The lab is located at the first door on the left as you exit the elevator.  HEALTHCARE LAWS AND MY CHART RESULTS: Due to recent changes in healthcare laws, you may see the results of your imaging and laboratory studies on MyChart before your provider has had a chance to review them.   We understand that in some cases there may be results that are confusing or concerning to you. Not all laboratory results come back in the same time frame and the provider may be waiting for multiple results in order to interpret others.  Please give [place] 48 hours in order for your provider to thoroughly review all the results before contacting the office for clarification of your results.   We refilled your Pantoprazole, Dicyclomine and Linzess. We have also given you some samples of the Linzess.  Desitin: Apply a small amount to the external and internal anal area three times a day as needed.  The [facility] providers would like to encourage you to use [facility] to communicate with providers for non-urgent requests or questions.  Due to long hold times on the telephone, sending your provider a message by [facility] may be faster and more efficient way to get a response. Please allow 48 business hours for a response.  Please remember that this is for non-urgent requests/questions. If you are age 42 or older, your body mass index should be between 23-30. Your Body mass index is 28.21 kg/m. If this is out of the aforementioned range listed, please consider follow up with your Primary Care Provider.  If you are age 20 or younger, your body mass index should be between 19-25. Your Body mass index is 28.21 kg/m. If this is out of the aformentioned range listed, please  consider follow up with your Primary Care Provider.

## 2022-01-14 NOTE — H&P (View-Only) (Signed)
01/14/2022 Raven [name] [id number] 11-10-1981   Chief Complaint: Constipation, rectal bleeding, difficulty swallowing  History of Present Illness: Raven [name] is a 41 year old female with a past medical history of fibromyalgia, asthma, nonischemic cardiomyopathy, CHF, PVCs, GERD, chronic oropharyngeal dysphagia, tension dysphonia, chronic narcotic use with chronic constipation. She is followed by Dr. Havery Moros.  She presents her office today with complaints of numerous recurrent GI symptoms.  She continues to have dysphagia on a daily basis.  She describes feeling as if food gets stuck in a pocket in her throat which can remain stuck for an entire day results and pain with swallowing.  She also describes throat tightness/fullness which makes it difficult to swallow at times but does not occur daily.  She often has a foul taste in her mouth and bad breath.  She stopped taking pantoprazole 1 year ago when she ran out of this prescription.  No heartburn.  Her most recent EGD was 09/29/2019, a subtle stenosis at the UES was dilated, the esophagus was dilated to 18 mm.  She stated her dysphagia symptoms improved 6 months post EGD with dilatation then recurred and gradually worsened over the past year. She saw ENT Dr. Thedore Mins with [facility] 11/13/2021 who completed a laryngoscopy which showed she likely had muscle tension dysphagia without evidence of vocal cordy paralysis. She stated she is scheduled to have a video swallow study while eating solid food in [place] on 01/27/2021.  She continues to have voice dysphonia.  Today, her voice seems clear and audible but she stated she has days when her voice is hoarse which results in speaking in a whisper type tone. She has undergone extensive dysphagia evaluation as previously reviewed by Dr. Havery Moros.  See past endoscopic and ENT procedure results below.  Laryngoscopy 11/13/2021: The nasopharynx is clear.  The tongue base, vallecula and  epiglottis are normal. Vocal folds are fully mobile for abduction and adduction. Moderate MTD. No vocal cord nodules, cysts, lesions or masses are seen.  The piriform sinuses are clear. The inner arytenoid area is normal.  ENT lVideo larynostroboscopy on 05/03/20- Salient features from that report include: Severe to profound anterior to posterior compression (left arytenoid touches petiole of epiglottis) during phonation reflective of muscle tension dysphonia/laryngeal spasm, bilateral vocal fold fullness likely secondary to smoking history (mild polypoid degeneration). Her dysphonia and laryngoscopy findings were consistent with tension dysphonia per ENT.  Manometry done again Feb 2020 - normal Barium swallow 01/27/19 - reflux noted, otherwise normal exam   Modified barium swallow 01/26/2018 - normal CT neck 12/10/2017 - normal   Esophageal manometry 02/23/15 - normal, no evidence of dysmotility Barium swallow 10/04/2014 - incomplete clearance of contrast in upper esophagus, absent secondary peristalsis, concern for dysmotility   Colonoscopy 05/10/2014 - 79mm ascending colon polyp, 55mm ascending colon polyp, diverticulosis sigmoid colon - one polyp TA, one polyp HP EGD 04/13/2014 - normal  Barium study 2010 normal    EGD 09/29/19 - Esophagogastric landmarks identified. - Normal esophagus - empiric dilation performed to 76mm with good result, patient appears to have a subtle stenosis at UES which was dilated - Normal stomach. - Normal duodenal bulb and second portion of the duodenum.    EGD 06/08/19 -  - The exam of the esophagus was otherwise normal. - A guidewire was placed and the scope was withdrawn. Empiric dilation was performed in the entire esophagus with a Savary dilator with mild resistance at 17 mm and 18 mm. [place]  endoscopy showed an appropriate mucosal wrent at the proximal esophagus / UES, suspect subtle stenosis there was causing dysphagia. Biopsies were taken with a cold forceps  in the upper third of the esophagus, in the middle third of the esophagus and in the lower third of the esophagus for histology, rule out EoE. - The entire examined stomach was normal. - The duodenal bulb and second portion of the duodenum were normal.  Path benign    She also complains of constipation with generalized abdominal pain, gas and bloat.  She sometimes has a throbbing pain to the center of her abdomen.  Her constipation was previously well controlled with Linzess, ever, she ran out of her Linzess prescription 1 year ago.  She is passing a hard brown stool twice weekly.  She feels pressure in her lower abdomen with intermittent sharp rectal pain over the past few months.  She denies using any over-the-counter laxatives.  She occasionally sees a spot of bright red blood on the stool or on the toilet tissue after straining.  Her most recent colonoscopy was 06/08/2019: See results below.  Colonoscopy 06/08/19 - Hemorrhoids were found on perianal exam. - A 3 mm polyp was found in the cecum. The polyp was sessile. The polyp was removed with a cold snare. Resection and retrieval were complete. - Multiple small-mouthed diverticula were found in the ascending colon and left colon. - Internal hemorrhoids were found during retroflexion. - The exam was otherwise without abnormality.  Path adenoma - repeat colonoscopy in 7 years  Colonoscopy 05/10/2014 - 52mm ascending colon polyp, 26mm ascending colon polyp, diverticulosis sigmoid colon - one polyp TA, one polyp HP  She continues to have generalized body aches.  Specifically worse to her arms and legs for which she takes Oxycodone 4 times daily as prescribed by her pain specialist.  She complains of  having swelling to both breasts with lateral nipple discharge for the past few months.  Her last menstrual cycle was 01/27/2022.  She has not seen her gynecologist for more than 1 year.  She is in the process of searching for a new gynecologist as her  prior GYN is no longer accepting her insurance.  No history of prolactinoma.  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  Past Medical History:  Diagnosis Date   Abdominal pain, left lower quadrant 03/27/2014   Abnormal CXR 05/03/2019   05/03/2019   Walked RA  2 laps @  approx 274ft each @ fast pace  stopped due to  End of study with sats 95% at end     Abnormal Pap smear    Achalasia    esphog stretched several times   Acute pain of both knees 09/07/2018   Allergy    seasonal allergies   Anxiety    on meds   Arthritis    generalized-on meds   Asthma    rarely uses inhaler-PRN-stopped smoking-symptoms decreased   ASTHMA 10/09/2008   Spirometry wnl  08/08/14 with active symptoms attributed to [place]     Blood transfusion 01/2006   [facility]   Cardiomyopathy [facility])    Cardiomyopathy, idiopathic ([place]) 01/09/2014   CHF (congestive heart failure) ([place]) 06/2020   on meds   Chronic bilateral low back pain 09/07/2018   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 01/18/2018   Chronically on opiate therapy 01/18/2018   Cigarette smoker 08/09/2014   Constipation due  to opioid therapy    Depression    DEPRESSION 10/09/2008   Qualifier: Diagnosis of  By: Tilden Dome  -on meds   Dysphagia 08/10/2009   Qualifier: Diagnosis of  By: Shane Crutch, Amy S    Dyspnea 08/08/2014   Followed in Pulmonary clinic/ [facility]/ [place]  - 08/08/2014  Walked RA x 3 laps @ 185 ft each stopped due to  End of study, no sob or desat - Spirometry 08/08/14 wnl     Dysrhythmia    Esophageal reflux 11/30/2008   Centricity Description: ESOPHAGEAL REFLUX Qualifier: Diagnosis of  By: Deatra Ina MD, Sandy Salaam  Centricity Description: GERD Qualifier: Diagnosis of  By: Shane Crutch, Amy S    ESOPHAGEAL STRICTURE 08/10/2009   Qualifier: Diagnosis of  By: Shane Crutch, Amy S    Excessive  daytime sleepiness 01/18/2018   Fibromyalgia    on meds   Gallstones and inflammation of gallbladder without obstruction 01/23/2015   GERD (gastroesophageal reflux disease)    on meds - diet controlled   Globus sensation    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   Heart murmur    Heart palpitations    tx with atenolol by steven klein    Hiatal hernia    History of abnormal Pap smear    History of chlamydia 1990's   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   HSV-2 infection    HTN (hypertension)    on meds   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   Occipital neuralgia of right side 04/21/2017   Palpitation 04/11/2011   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   Pneumonia 05/2013   PVC's (premature ventricular contractions)    S/P laparoscopic surgery 01/23/2015   Shortness of breath dyspnea    Sleep disorder, circadian, 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   Status post dilation of esophageal narrowing    Unspecified constipation 11/30/2008   Centricity Description: UNSPECIFIED CONSTIPATION Qualifier: Diagnosis of  By: Deatra Ina MD, Sandy Salaam  Centricity Description: CONSTIPATION Qualifier: Diagnosis of  By: Shane Crutch, Amy S    Upper airway cough syndrome 09/05/2014   Followed in Pulmonary clinic/ [facility]/ [place]  Onset  2002 p tonsilectomy  -Spirometry wnl  08/08/14  - referred to [place] [facility] 09/05/2014 > inconclusive w/u by ENT / GI s f/u ov by either since 2016 > referred  Back to Dr Joya Gaskins 10/20/2017  - Dg Es 01/27/2019  Spontaneous gastroesophageal reflux. 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   3   furosemide (LASIX) 40 MG tablet Take 1 tablet (40 mg total) by mouth daily. 30 tablet 3   hydrOXYzine (ATARAX/VISTARIL) 50 MG tablet Take 50 mg by mouth every 6 (six) hours as needed for anxiety.   0   LATUDA 60 MG TABS Take 60 mg by mouth daily.   0   lisinopril (ZESTRIL) 5 MG tablet Take 1 tablet (5 mg total) by mouth daily. 30 tablet 6   LORazepam (ATIVAN) 2 MG tablet Take 2  mg by mouth 3 (three) times daily.     metoprolol tartrate (LOPRESSOR) 25 MG tablet Take 0.5 tablets (12.5 mg total) by mouth 2 (two) times daily. 30 tablet 3   oxyCODONE-acetaminophen (PERCOCET) 10-325 MG tablet Take 1 tablet by mouth every 4 (four) hours.  0   spironolactone (ALDACTONE) 25 MG tablet Take 0.5 tablets (12.5 mg total) by mouth every other day. 15 tablet 3   No current facility-administered medications on file prior to visit.   Allergies  Allergen Reactions   Dust Mite Extract Shortness Of Breath   Other Shortness Of Breath    Feathers, trees "almost everything outside"   Pollen Extract Shortness Of Breath   Current Medications, Allergies, Past Medical History, Past Surgical History, Family History and Social History were reviewed in [employer] record.  Review of Systems:   Constitutional: Negative for fever, sweats, chills or weight loss.  Respiratory: Negative for shortness of breath.   Cardiovascular: Negative for chest pain, palpitations and leg swelling.  Gastrointestinal: See HPI.  Musculoskeletal: + Generalized body aches more prevalent to the upper and lower extremities. Neurological: Negative for dizziness, headaches or paresthesias.   Physical Exam: BP 130/80    Pulse (!) 101    Ht 5\' 9"  (1.753 m)    Wt 191 lb (86.6 kg)    BMI 28.21 kg/m  General: 41 year old female in no acute distress. Head: Normocephalic and atraumatic. Eyes: No scleral icterus. Conjunctiva pink . Ears: Normal auditory  acuity. Mouth: Dentition intact. No ulcers or lesions.  Lungs: Clear throughout to auscultation. Heart: Regular rate and rhythm, no murmur. Abdomen: Soft, nondistended.  Mild generalized tenderness without rebound or guarding.  No masses or hepatomegaly. Normal bowel sounds x 4 quadrants.  No bruit. Rectal: No external hemorrhoids.  A small amount of clear drainage to the perianal area.  Internal hemorrhoids palpated without prolapse.  No stool or blood in the rectal vault.  Raven [name] CMA present during exam. Musculoskeletal: Symmetrical with no gross deformities. Extremities: No edema. Neurological: Alert oriented x 4. No focal deficits.  Psychological: Alert and cooperative. Normal mood and affect  Assessment and Recommendations:  46) 41 year old female with oral phase dysphagia +/-cervical esophagus (skeletal muscle) dysphagia. Dysphagia with dysphonia, diagnosed with tension dysphonia per ENT.  No evidence of vocal cord paralysis per recent laryngoscopy.  Her most recent EGD was 09/29/2019, a subtle stenosis at the UES was dilated, the esophagus was empirically dilated to 18 mm.  Her dysphagia symptoms improved for approximately 6 months post dilatation then recurred.  See past EGD, esophageal manometry, swallow study and laryngoscopy results above. -Proceed with video swallow study with ENT in [place] on 01/27/2022 -I will consult with Dr. Havery Moros to determine if a repeat EGD with dilatation is warranted -Rule out Check CK level to rule out polymyositis, CRP/Sed rate to rule out PMR -Restart Pantoprazole 40 mg 1 p.o. daily -If the above evaluation inconclusive, consider neuro evaluation to rule out neuro etiology   2) Chronic constipation in setting of chronic narcotic use.  Linzess previously controlled constipation well, she stopped taking it 1 year ago after her prescription ran out. -Restart Linzess to 90 mg 1 p.o. daily -Fiber diet as tolerated  3) Rectal bleeding associated with  straining/constipation, likely hemorrhoidal -See plan in #2 -Apply a small amount of Desitin inside the anal opening and to the external anal area tid as needed for anal or hemorrhoidal irritation/bleeding.  -CBC  4) Generalized abdominal pain, likely due  to constipation -CBC, CMP -Linzess as ordered above -Gas-X twice daily as needed -Consider CTAP if abdominal pain worsens  5) History of colon polys. Father with history of colon polyps. Paternal uncle diagnosed with colon cancer age 68.  Her most recent colonoscopy 06/08/2019 identified a 3 mm tubular adenomatous polyp removed from the cecum. -Recall colonoscopy 05/2026  6) Bilateral breast swelling with reported nipple discharge -Patient to follow-up with PCP for further evaluation and to obtain GYN referral.  She will need a mammogram, consider brain MRI to rule out prolactinoma.  7) History of ischemic cardiomyopathy, CHF.  LV EF 30 - 35%.  Clinically stable at this time.  Today's encounter was 40 minutes including precharting, result review, obtaining history/exam, face-to-face time used for counseling, formulating treatment plan with follow-up and documentation.  ADDENDUM: Patient elects to schedule an EGD with esophageal dilatation with Dr. Havery Moros.  EGD will be scheduled at [facility] due to LVEF 30 to 35%.

## 2022-01-15 ENCOUNTER — Telehealth: Payer: Self-pay

## 2022-01-15 NOTE — Progress Notes (Signed)
Beth, pls contact patient and schedule her for an EGD with esophageal dilatation with Dr. Havery Moros. Refer to office visit 01/14/2022 and Dr. Doyne Keel addendum.  Pls remind patient to follow up with her pcp to obtain a gyn referral for her bilateral breast swelling and nipple discharge. Patient's pasts gyn is no longer takes her insurance. thx

## 2022-01-15 NOTE — Progress Notes (Signed)
Agree with assessment and plan as outlined. She should continue to follow up with ENT for her dysphonia. I have done a few EGDs for her in the past and she does seem to have reliable benefit for her dysphagia with dilation of her upper esophagus. I have thought she has had a subtle stenosis just inferior to the UES on prior exams. If she had significant benefit from the last EGD with dilation and wishes to pursue another for repeat dilation we can offer her the exam. She should continue to see ENT in the interim.

## 2022-01-15 NOTE — Telephone Encounter (Signed)
See the office visit from 01/14/22. Called the patient. She feels she had "really good improvement" when she last had an esophageal dilation. She is agreeable to repeating this with Dr Havery Moros.  She will need this done at the hospital due to her EF less than 35%. Will monitor the hospital endo schedule for an opening. Dr Havery Moros does not have hospital time this month or February. March schedule is not released. Patient is aware of this. If she acutely worsens, she will contact us.

## 2022-01-17 ENCOUNTER — Other Ambulatory Visit: Payer: Self-pay

## 2022-01-17 DIAGNOSIS — R1310 Dysphagia, unspecified: Secondary | ICD-10-CM

## 2022-01-17 NOTE — Telephone Encounter (Signed)
Spoke with the patient. She is agreeable to having her procedure with Dr Silverio Decamp. Instructions reviewed over the phone verbally. Written copy will be mailed to her. She also uses her My Chart and can review them through her portal. She will call with any questions or concerns. Referral and hospital orders enter.  Sending this to Dr Silverio Decamp for review.

## 2022-01-17 NOTE — Telephone Encounter (Signed)
There is no hospital time Feb, March or April on Dr Armbruster's schedule.  Please advise.

## 2022-01-17 NOTE — Telephone Encounter (Signed)
Called the patient to discuss. No answer. Left her a message to call me back.

## 2022-01-17 NOTE — Telephone Encounter (Signed)
Got it, thanks!

## 2022-01-22 ENCOUNTER — Encounter (HOSPITAL_COMMUNITY): Payer: Self-pay | Admitting: Gastroenterology

## 2022-01-25 ENCOUNTER — Other Ambulatory Visit: Payer: Self-pay | Admitting: Family Medicine

## 2022-01-25 DIAGNOSIS — N6452 Nipple discharge: Secondary | ICD-10-CM

## 2022-01-28 ENCOUNTER — Other Ambulatory Visit: Payer: Self-pay

## 2022-01-28 MED ORDER — DICYCLOMINE HCL 10 MG PO CAPS: 10.0000 mg | ORAL_CAPSULE | Freq: Three times a day (TID) | ORAL | 1 refills | Status: DC

## 2022-01-30 ENCOUNTER — Encounter (HOSPITAL_COMMUNITY): Payer: Self-pay | Admitting: Gastroenterology

## 2022-01-30 ENCOUNTER — Encounter (HOSPITAL_COMMUNITY)
Admission: RE | Disposition: A | Discharge status: Home / Self Care | Payer: Self-pay | Source: Home / Self Care | Attending: Gastroenterology

## 2022-01-30 ENCOUNTER — Other Ambulatory Visit: Payer: Self-pay

## 2022-01-30 ENCOUNTER — Ambulatory Visit (HOSPITAL_COMMUNITY)
Admission: RE | Admit: 2022-01-30 | Discharge: 2022-01-30 | Disposition: A | Discharge status: Home / Self Care | Payer: Medicare HMO | Attending: Gastroenterology | Admitting: Gastroenterology

## 2022-01-30 ENCOUNTER — Ambulatory Visit (HOSPITAL_COMMUNITY): Payer: Medicare HMO | Admitting: Certified Registered Nurse Anesthetist

## 2022-01-30 DIAGNOSIS — M797 Fibromyalgia: Secondary | ICD-10-CM | POA: Insufficient documentation

## 2022-01-30 DIAGNOSIS — I509 Heart failure, unspecified: Secondary | ICD-10-CM | POA: Insufficient documentation

## 2022-01-30 DIAGNOSIS — J45909 Unspecified asthma, uncomplicated: Secondary | ICD-10-CM | POA: Insufficient documentation

## 2022-01-30 DIAGNOSIS — I255 Ischemic cardiomyopathy: Secondary | ICD-10-CM | POA: Diagnosis not present

## 2022-01-30 DIAGNOSIS — K5909 Other constipation: Secondary | ICD-10-CM | POA: Diagnosis not present

## 2022-01-30 DIAGNOSIS — K219 Gastro-esophageal reflux disease without esophagitis: Secondary | ICD-10-CM | POA: Insufficient documentation

## 2022-01-30 DIAGNOSIS — Z800 Family history of malignant neoplasm of digestive organs: Secondary | ICD-10-CM | POA: Diagnosis not present

## 2022-01-30 DIAGNOSIS — R490 Dysphonia: Secondary | ICD-10-CM | POA: Insufficient documentation

## 2022-01-30 DIAGNOSIS — Z8371 Family history of colonic polyps: Secondary | ICD-10-CM | POA: Insufficient documentation

## 2022-01-30 DIAGNOSIS — Z86010 Personal history of colonic polyps: Secondary | ICD-10-CM | POA: Diagnosis not present

## 2022-01-30 DIAGNOSIS — I110 Hypertensive heart disease with heart failure: Secondary | ICD-10-CM | POA: Insufficient documentation

## 2022-01-30 DIAGNOSIS — R1310 Dysphagia, unspecified: Secondary | ICD-10-CM | POA: Insufficient documentation

## 2022-01-30 DIAGNOSIS — N6310 Unspecified lump in the right breast, unspecified quadrant: Secondary | ICD-10-CM | POA: Diagnosis not present

## 2022-01-30 DIAGNOSIS — N6452 Nipple discharge: Secondary | ICD-10-CM | POA: Diagnosis not present

## 2022-01-30 DIAGNOSIS — Z79891 Long term (current) use of opiate analgesic: Secondary | ICD-10-CM | POA: Diagnosis not present

## 2022-01-30 DIAGNOSIS — N6320 Unspecified lump in the left breast, unspecified quadrant: Secondary | ICD-10-CM | POA: Diagnosis not present

## 2022-01-30 HISTORY — PX: ESOPHAGOGASTRODUODENOSCOPY (EGD) WITH PROPOFOL: SHX5813

## 2022-01-30 HISTORY — PX: ESOPHAGEAL DILATION: SHX303

## 2022-01-30 SURGERY — ESOPHAGOGASTRODUODENOSCOPY (EGD) WITH PROPOFOL
Anesthesia: Monitor Anesthesia Care

## 2022-01-30 MED ORDER — LIDOCAINE 2% (20 MG/ML) 5 ML SYRINGE
INTRAMUSCULAR | Status: DC | PRN
  Administered 2022-01-30: 100 mg via INTRAVENOUS

## 2022-01-30 MED ORDER — PROPOFOL 10 MG/ML IV BOLUS
INTRAVENOUS | Status: DC | PRN
  Administered 2022-01-30: 50 mg via INTRAVENOUS

## 2022-01-30 MED ORDER — PROPOFOL 500 MG/50ML IV EMUL
INTRAVENOUS | Status: DC | PRN
  Administered 2022-01-30: 150 ug/kg/min via INTRAVENOUS

## 2022-01-30 MED ORDER — SODIUM CHLORIDE 0.9 % IV SOLN: INTRAVENOUS | Status: DC

## 2022-01-30 MED ORDER — LACTATED RINGERS IV SOLN: INTRAVENOUS | Status: DC

## 2022-01-30 MED ORDER — LACTATED RINGERS IV SOLN: INTRAVENOUS | Status: DC | PRN

## 2022-01-30 SURGICAL SUPPLY — 15 items

## 2022-01-30 NOTE — Op Note (Addendum)
[facility] Patient Name: Raven [name] Procedure Date: 01/30/2022 MRN: [id number] Attending MD: Mauri Pole , MD Date of Birth: Oct 02, 1981 CSN: [id number] Age: 41 Admit Type: Outpatient Procedure:                Upper GI endoscopy Indications:              Dysphagia Providers:                Mauri Pole, MD, Princess Bruins, RN, Jeanella Cara, RN, Tyna Jaksch Technician Referring MD:              Medicines:                Monitored Anesthesia Care Complications:            No immediate complications. Estimated Blood Loss:     Estimated blood loss was minimal. Procedure:                Pre-Anesthesia Assessment:                           - Prior to the procedure, a History and Physical                            was performed, and patient medications and                            allergies were reviewed. The patient's tolerance of                            previous anesthesia was also reviewed. The risks                            and benefits of the procedure and the sedation                            options and risks were discussed with the patient.                            All questions were answered, and informed consent                            was obtained. ASA Grade Assessment: II - A patient                            with mild systemic disease. After reviewing the                            risks and benefits, the patient was deemed in                            satisfactory condition to undergo the procedure.  After obtaining informed consent, the endoscope was                            passed under direct vision. Throughout the                            procedure, the patient's blood pressure, pulse, and                            oxygen saturations were monitored continuously. The                            [id number]-[id number] ([id number]) Olympus endoscope was introduced                             through the mouth, and advanced to the second part                            of duodenum. The upper GI endoscopy was                            accomplished without difficulty. The patient                            tolerated the procedure well. Scope In: Scope Out: Findings:      The Z-line was regular and was found 36 cm from the incisors.      No endoscopic abnormality was evident in the esophagus to explain the       patient's complaint of dysphagia. It was decided, however, to proceed       with dilation of the entire esophagus. The scope was withdrawn. Dilation       was performed with a Maloney dilator with no resistance at 52 Fr and       mild resistance at 54 Fr. The dilation site was examined following       endoscope reinsertion and showed mild mucosal disruption in the proximal       esophagus near upper esophageal sphincter.      The stomach was normal.      The cardia and gastric fundus were normal on retroflexion.      The examined duodenum was normal. Impression:               - Z-line regular, 36 cm from the incisors.                           - No endoscopic esophageal abnormality to explain                            patient's dysphagia. Esophagus dilated. Dilated.                           - Normal stomach.                           - Normal examined duodenum.                           -  No specimens collected. Moderate Sedation:      Not Applicable - Patient had care per Anesthesia. Recommendation:           - Patient has a contact number available for                            emergencies. The signs and symptoms of potential                            delayed complications were discussed with the                            patient. Return to normal activities tomorrow.                            Written discharge instructions were provided to the                            patient.                           - Resume previous diet.                            - Continue present medications.                           - Follow up with Dr.Armbruster in GI office at next                            available appointment, please call [facility]                            office to schedule appointment Procedure Code(s):        --- Professional ---                           [id number], Esophagogastroduodenoscopy, flexible,                            transoral; diagnostic, including collection of                            specimen(s) by brushing or washing, when performed                            (separate procedure)                           43450, Dilation of esophagus, by unguided sound or                            bougie, single or multiple passes Diagnosis Code(s):        --- Professional ---  R13.10, Dysphagia, unspecified CPT copyright 2019 American Medical Association. All rights reserved. The codes documented in this report are preliminary and upon coder review may  be revised to meet current compliance requirements. Mauri Pole, MD 01/30/2022 11:34:05 AM This report has been signed electronically. Number of Addenda: 0

## 2022-01-30 NOTE — Interval H&P Note (Signed)
History and Physical Interval Note:  01/30/2022 10:48 AM  Raven [name]  has presented today for surgery, with the diagnosis of dysphagia.  The various methods of treatment have been discussed with the patient and family. After consideration of risks, benefits and other options for treatment, the patient has consented to  Procedure(s): ESOPHAGOGASTRODUODENOSCOPY (EGD) WITH PROPOFOL (N/A) BALLOON DILATION (N/A) as a surgical intervention.  The patient's history has been reviewed, patient examined, no change in status, stable for surgery.  I have reviewed the patient's chart and labs.  Questions were answered to the patient's satisfaction.     [name]

## 2022-01-30 NOTE — Anesthesia Preprocedure Evaluation (Addendum)
Anesthesia Evaluation  Patient identified by MRN, date of birth, ID band Patient awake    Reviewed: Allergy & Precautions, NPO status , Patient's Chart, lab work & pertinent test results  History of Anesthesia Complications Negative for: history of anesthetic complications  Airway Mallampati: II  TM Distance: >3 FB Neck ROM: Full    Dental no notable dental hx.    Pulmonary asthma , former smoker,    Pulmonary exam normal        Cardiovascular hypertension, +CHF  Normal cardiovascular exam  TTE 06/2020: EF 30 to 35%, global hypokinesis, mild LVH, grade I DD    Neuro/Psych Anxiety Depression negative neurological ROS     GI/Hepatic Neg liver ROS, hiatal hernia, GERD  Medicated and Controlled,Achalasia   Endo/Other  negative endocrine ROS  Renal/GU negative Renal ROS  negative genitourinary   Musculoskeletal  (+) Arthritis , Fibromyalgia -  Abdominal   Peds  Hematology negative hematology ROS (+)   Anesthesia Other Findings Day of surgery medications reviewed with patient.  Reproductive/Obstetrics negative OB ROS                            Anesthesia Physical Anesthesia Plan  ASA: 3  Anesthesia Plan: MAC   Post-op Pain Management: Minimal or no pain anticipated   Induction:   PONV Risk Score and Plan: 2 and Treatment may vary due to age or medical condition and Propofol infusion  Airway Management Planned: Natural Airway and Nasal Cannula  Additional Equipment: None  Intra-op Plan:   Post-operative Plan:   Informed Consent: I have reviewed the patients History and Physical, chart, labs and discussed the procedure including the risks, benefits and alternatives for the proposed anesthesia with the patient or authorized representative who has indicated his/her understanding and acceptance.       Plan Discussed with: CRNA  Anesthesia Plan Comments:        Anesthesia  Quick Evaluation

## 2022-01-30 NOTE — Discharge Instructions (Signed)

## 2022-01-30 NOTE — Transfer of Care (Signed)
Immediate Anesthesia Transfer of Care Note  Patient: Raven [name]  Procedure(s) Performed: ESOPHAGOGASTRODUODENOSCOPY (EGD) WITH PROPOFOL ESOPHAGEAL DILATION  Patient Location: PACU and Endoscopy Unit  Anesthesia Type:MAC  Level of Consciousness: awake, alert  and oriented  Airway & Oxygen Therapy: Patient Spontanous Breathing and Patient connected to face mask oxygen  Post-op Assessment: Report given to RN and Post -op Vital signs reviewed and stable  Post vital signs: Reviewed and stable  Last Vitals:  Vitals Value Taken Time  BP    Temp    Pulse    Resp    SpO2      Last Pain:  Vitals:   01/30/22 1036  TempSrc: Oral  PainSc: 0-No pain         Complications: No notable events documented.

## 2022-01-30 NOTE — Anesthesia Postprocedure Evaluation (Signed)
Anesthesia Post Note  Patient: Raven [name]  Procedure(s) Performed: ESOPHAGOGASTRODUODENOSCOPY (EGD) WITH PROPOFOL ESOPHAGEAL DILATION     Patient location during evaluation: PACU Anesthesia Type: MAC Level of consciousness: awake and alert Pain management: pain level controlled Vital Signs Assessment: post-procedure vital signs reviewed and stable Respiratory status: spontaneous breathing, nonlabored ventilation and respiratory function stable Cardiovascular status: blood pressure returned to baseline Postop Assessment: no apparent nausea or vomiting Anesthetic complications: no   No notable events documented.  Last Vitals:  Vitals:   01/30/22 1131 01/30/22 1140  BP: (!) 109/39 124/61  Pulse: 95 99  Resp: (!) 27 20  Temp: 36.6 C   SpO2: 97% 98%    Last Pain:  Vitals:   01/30/22 1131  TempSrc: Temporal  PainSc: 0-No pain                 Marthenia Rolling

## 2022-01-31 ENCOUNTER — Encounter (HOSPITAL_COMMUNITY): Payer: Self-pay | Admitting: Gastroenterology

## 2022-02-19 ENCOUNTER — Ambulatory Visit
Admission: RE | Admit: 2022-02-19 | Discharge: 2022-02-19 | Disposition: A | Discharge status: Home / Self Care | Payer: Medicare HMO | Source: Ambulatory Visit | Attending: Family Medicine | Admitting: Family Medicine

## 2022-02-19 ENCOUNTER — Ambulatory Visit: Payer: Medicare HMO

## 2022-02-19 ENCOUNTER — Other Ambulatory Visit: Payer: Self-pay | Admitting: Internal Medicine

## 2022-02-19 ENCOUNTER — Other Ambulatory Visit: Payer: Self-pay

## 2022-02-19 ENCOUNTER — Other Ambulatory Visit: Payer: Self-pay | Admitting: Family Medicine

## 2022-02-19 DIAGNOSIS — R921 Mammographic calcification found on diagnostic imaging of breast: Secondary | ICD-10-CM

## 2022-02-19 DIAGNOSIS — N6452 Nipple discharge: Secondary | ICD-10-CM

## 2022-03-03 ENCOUNTER — Other Ambulatory Visit: Payer: Self-pay | Admitting: Internal Medicine

## 2022-03-12 ENCOUNTER — Other Ambulatory Visit: Payer: Self-pay | Admitting: Internal Medicine

## 2022-08-21 ENCOUNTER — Inpatient Hospital Stay: Admission: RE | Admit: 2022-08-21 | Payer: Medicare HMO | Source: Ambulatory Visit

## 2022-09-03 ENCOUNTER — Other Ambulatory Visit: Payer: Self-pay | Admitting: Family Medicine

## 2022-09-03 ENCOUNTER — Ambulatory Visit
Admission: RE | Admit: 2022-09-03 | Discharge: 2022-09-03 | Disposition: A | Discharge status: Home / Self Care | Payer: Medicare HMO | Source: Ambulatory Visit | Attending: Family Medicine | Admitting: Family Medicine

## 2022-09-03 DIAGNOSIS — R921 Mammographic calcification found on diagnostic imaging of breast: Secondary | ICD-10-CM

## 2022-11-04 ENCOUNTER — Other Ambulatory Visit (HOSPITAL_COMMUNITY): Payer: Self-pay

## 2022-11-04 MED ORDER — OXYCODONE-ACETAMINOPHEN 10-325 MG PO TABS
1.0000 | ORAL_TABLET | Freq: Four times a day (QID) | ORAL | 0 refills | Status: DC
Start: 2022-11-04 — End: 2022-12-02
  Filled 2022-11-04: qty 120, 30d supply, fill #0

## 2022-11-24 ENCOUNTER — Telehealth: Payer: Self-pay | Admitting: Gastroenterology

## 2022-11-24 DIAGNOSIS — R1311 Dysphagia, oral phase: Secondary | ICD-10-CM

## 2022-11-24 DIAGNOSIS — R1310 Dysphagia, unspecified: Secondary | ICD-10-CM

## 2022-11-24 NOTE — Telephone Encounter (Signed)
Thanks [employer]. If she gets reliable relief with an EGD we can do it at the hospital (due to low EF). However, need to check with Dr. Dayle Points nurse - he asked me to help him with a patient with an urgent need who he asked me about last week, not sure if they are using that spot or not. If they are not, then we can book this patient there. If they are using the spot, we may have to do her case in January or February.

## 2022-11-24 NOTE — Telephone Encounter (Signed)
Dr. Havery Moros, pt last saw [facility] but you have performed her EGD's with dilation in the past. You currently have room on 12/01/22 at [facility] if you are OK with scheduling direct procedure. Please let me know and I will check the date with the patient. Thanks

## 2022-11-24 NOTE — Telephone Encounter (Signed)
Patient is calling states her throat is closing up again and its causing trouble swallowing and talking she is wondering if there was a way we could schedule her another esophageal dialation ASAP. Please advise

## 2022-11-25 NOTE — Telephone Encounter (Signed)
Called and spoke with patient. She states that she is having dysphagia and feels like there is a lump in her throat. I advised that the next available hospital slot is 02/05/23, pt states that she can't wait that long. I told pt that I understood her concerns. I encouraged pt to go to the ED if she was having severe difficulty swallowing and they can request a GI consult and they may be able to complete her EGD with dilation as an inpatient. I advised pt that our providers round at [facility]. Pt states that she gets panic attacks when she can't swallow. Pt will try the ED and will call us if she has any other concerns.

## 2022-12-02 ENCOUNTER — Other Ambulatory Visit (HOSPITAL_COMMUNITY): Payer: Self-pay

## 2022-12-02 MED ORDER — OXYCODONE-ACETAMINOPHEN 10-325 MG PO TABS
1.0000 | ORAL_TABLET | Freq: Four times a day (QID) | ORAL | 0 refills | Status: DC
Start: 2022-12-02 — End: 2023-01-05
  Filled 2022-12-02: qty 120, 30d supply, fill #0

## 2023-01-05 ENCOUNTER — Other Ambulatory Visit (HOSPITAL_COMMUNITY): Payer: Self-pay

## 2023-01-05 MED ORDER — OXYCODONE-ACETAMINOPHEN 10-325 MG PO TABS
1.0000 | ORAL_TABLET | Freq: Four times a day (QID) | ORAL | 0 refills | Status: DC
Start: 2023-01-05 — End: 2023-02-03
  Filled 2023-01-05: qty 120, 30d supply, fill #0

## 2023-02-03 ENCOUNTER — Other Ambulatory Visit (HOSPITAL_COMMUNITY): Payer: Self-pay

## 2023-02-03 MED ORDER — OXYCODONE-ACETAMINOPHEN 10-325 MG PO TABS
1.0000 | ORAL_TABLET | Freq: Four times a day (QID) | ORAL | 0 refills | Status: DC
Start: 2023-02-03 — End: 2023-03-03
  Filled 2023-02-03: qty 120, 30d supply, fill #0

## 2023-02-17 NOTE — Progress Notes (Deleted)
02/17/2023 Raven [name] [id number] 1981/02/06  Referring provider: Lin Landsman, MD Primary GI doctor: {acdocs:27040}  ASSESSMENT AND PLAN:   There are no diagnoses linked to this encounter.   Patient Care Team: Lin Landsman, MD as PCP - General (Family Medicine) Elouise Munroe, MD as PCP - Cardiology (Cardiology)  HISTORY OF PRESENT ILLNESS: 42 y.o. female with a past medical history of fibromyalgia, asthma, nonischemic cardiomyopathy, CHF EF 30%, PVCs chronic oropharyngeal dysphagia, reflux, vocal cord dysfunction, chronic narcotic use with chronic constipation and others listed below presents for evaluation of ***.  Extensive workup for patient's chronic oropharyngeal dysphagia/reflux that she has had since 2001 with multiple EGDs, barium swallows, esophageal manometry testing, CT imaging of the neck and following with ENT.  05/2019 EGD with empiric dilatation with Dr. Havery Moros provided some benefit of her symptoms for a few months  09/2019 repeat EGD with dilatation 18 mm good results for a few months but benefits waned. Patient stopped smoking On Linzess to 90 mcg for chronic constipation occasional Bentyl.  09/25/2020 Repeat EGD dilated to 18 11/13/2021 ENT laryngoscopy  without evidence of vocal cord paralysis, moderate MTD, no vocal cord nodules, cysts, lesions or masses 01/27/2022 supposed to have video swallow study while eating solid food neurology  02/16/2023 flexible endoscopic evaluation of swallowing performed with speech pathologist showed normal oropharyngeal swallow function with no penetration/aspiration.  Regular diet with thin liquids.  Provided reassurance that oropharyngeal swallow safe and functional offered course of swallowing therapy and biofeedback patient was not interested. 01/30/2022 EGD at [facility] long with Dr. Silverio Decamp showed no endoscopic abnormality esophagus dilated normal stomach normal duodenum no specimens collected. Patient with barium  swallow 12/2018 normal esophagram other than spontaneous gastroesophageal reflux. 01/2019 normal esophageal manometry. Patient on oxycodone 4 times daily for generalized body aches arms and legs.   Prior work-up: Manometry done again Feb 2020 - normal Barium swallow 01/27/19 - reflux noted, otherwise normal exam   Modified barium swallow 01/26/2018 - normal CT neck 12/10/2017 - normal   Esophageal manometry 02/23/15 - normal, no evidence of dysmotility Barium swallow 10/04/2014 - incomplete clearance of contrast in upper esophagus, absent secondary peristalsis, concern for dysmotility   Colonoscopy 05/10/2014 - 69m ascending colon polyp, 322mascending colon polyp, diverticulosis sigmoid colon - one polyp TA, one polyp HP EGD 04/13/2014 - normal  Barium study 2010 normal   EGD 09/29/19 - Esophagogastric landmarks identified. - Normal esophagus - empiric dilation performed to 1832mith good result, patient appears to have a subtle stenosis at UES which was dilated - Normal stomach. - Normal duodenal bulb and second portion of the duodenum.   EGD 06/08/19 - Path benign - The exam of the esophagus was otherwise normal. - A guidewire was placed and the scope was withdrawn. Empiric dilation was performed in the entire esophagus with a Savary dilator with mild resistance at 17 mm and 18 mm. Relook endoscopy showed an appropriate mucosal wrent at the proximal esophagus / UES, suspect subtle stenosis there was causing dysphagia. Biopsies were taken with a cold forceps in the upper third of the esophagus, in the middle third of the esophagus and in the lower third of the esophagus for histology, rule out EoE. - The entire examined stomach was normal. - The duodenal bulb and second portion of the duodenum were normal.   Colonoscopy 06/08/19 - Hemorrhoids were found on perianal exam. - A 3 mm polyp was found in the cecum. The polyp was sessile. The polyp  was removed with a cold snare. Resection and  retrieval were complete. - Multiple small-mouthed diverticula were found in the ascending colon and left colon. - Internal hemorrhoids were found during retroflexion. - The exam was otherwise without abnormality. Path adenoma - repeat colonoscopy in 7 years ( 05/2026)   05/03/20- speech path - video larynostroboscopy-  Salient features from that report include: Severe to profound anterior to posterior compression (left arytenoid touches petiole of epiglottis) during phonation reflective of muscle tension dysphonia/laryngeal spasm; bilateral vocal fold fullness likely secondary to smoking history (mild polypoid degeneration).   EGD 09/25/2020  - Esophagogastric landmarks identified.  - Normal esophagus otherwise - empiric dilation to 66m performed, leading to 2 appropriate mucosal wrents at the UES. Patient has a subtle stenosis there, hopefully dilation will provide benefit as it has in the past. - Normal stomach. - Normal duodenal bulb and second portion of the duodenum.  Laryngoscopy 11/13/2021: The nasopharynx is clear.  The tongue base, vallecula and epiglottis are normal. Vocal folds are fully mobile for abduction and adduction. Moderate MTD. No vocal cord nodules, cysts, lesions or masses are seen.  The piriform sinuses are clear. The inner arytenoid area is normal.  She  reports that she quit smoking about 3 years ago. Her smoking use included cigarettes. She has a 3.75 pack-year smoking history. She has never used smokeless tobacco. She reports that she does not drink alcohol and does not use drugs.  RELEVANT LABS AND IMAGING: CBC    Component Value Date/Time   WBC 8.2 01/14/2022 1003   RBC 4.42 01/14/2022 1003   HGB 13.4 01/14/2022 1003   HCT 40.7 01/14/2022 1003   PLT 202.0 01/14/2022 1003   MCV 92.1 01/14/2022 1003   MCH 30.6 06/15/2020 0836   MCHC 32.8 01/14/2022 1003   RDW 13.3 01/14/2022 1003   LYMPHSABS 3.8 04/29/2019 0245   MONOABS 0.6 04/29/2019 0245   EOSABS 0.1  04/29/2019 0245   BASOSABS 0.0 04/29/2019 0245   No results for input(s): "HGB" in the last 8760 hours.   CMP     Component Value Date/Time   NA 139 01/14/2022 1003   NA 142 08/17/2020 1227   K 4.1 01/14/2022 1003   CL 106 01/14/2022 1003   CO2 25 01/14/2022 1003   GLUCOSE 109 (H) 01/14/2022 1003   BUN 9 01/14/2022 1003   BUN 12 08/17/2020 1227   CREATININE 0.77 01/14/2022 1003   CALCIUM 9.1 01/14/2022 1003   PROT 7.3 01/14/2022 1003   ALBUMIN 4.4 01/14/2022 1003   AST 17 01/14/2022 1003   ALT 22 01/14/2022 1003   ALKPHOS 55 01/14/2022 1003   BILITOT 0.6 01/14/2022 1003   GFRNONAA 75 08/17/2020 1227   GFRAA 87 08/17/2020 1227      Latest Ref Rng & Units 01/14/2022   10:03 AM 06/15/2020    8:36 AM 04/29/2019    2:45 AM  Hepatic Function  Total Protein 6.0 - 8.3 g/dL 7.3  7.3  6.6   Albumin 3.5 - 5.2 g/dL 4.4  4.4  3.9   AST 0 - 37 U/L 17  23  23   $ ALT 0 - 35 U/L 22  27  24   $ Alk Phosphatase 39 - 117 U/L 55  54  61   Total Bilirubin 0.2 - 1.2 mg/dL 0.6  0.7  0.5       Current Medications:    Current Outpatient Medications (Cardiovascular):    furosemide (LASIX) 40 MG tablet, Take 1 tablet (  40 mg total) by mouth daily.   lisinopril (ZESTRIL) 5 MG tablet, Take 1 tablet (5 mg total) by mouth daily.   metoprolol tartrate (LOPRESSOR) 25 MG tablet, Take 0.5 tablets (12.5 mg total) by mouth 2 (two) times daily.   spironolactone (ALDACTONE) 25 MG tablet, Take 0.5 tablets (12.5 mg total) by mouth every other day.   Current Outpatient Medications (Analgesics):    oxyCODONE-acetaminophen (PERCOCET) 10-325 MG tablet, Take 1 tablet by mouth every 4 (four) hours.   oxyCODONE-acetaminophen (PERCOCET) 10-325 MG tablet, Take 1 tablet by mouth 4 (four) times daily.   Current Outpatient Medications (Other):    dicyclomine (BENTYL) 10 MG capsule, Take 1 capsule (10 mg total) by mouth 3 (three) times daily before meals.   FLUoxetine (PROZAC) 20 MG/5ML solution, Take 20 mg by mouth  daily.    hydrOXYzine (ATARAX/VISTARIL) 50 MG tablet, Take 50 mg by mouth every 6 (six) hours as needed for anxiety.    LATUDA 60 MG TABS, Take 60 mg by mouth daily.    linaclotide (LINZESS) 290 MCG CAPS capsule, Take 1 capsule (290 mcg total) by mouth daily before breakfast.   LORazepam (ATIVAN) 2 MG tablet, Take 2 mg by mouth 3 (three) times daily.   pantoprazole (PROTONIX) 40 MG tablet, Take 40 mg by mouth daily.   pantoprazole sodium (PROTONIX) 40 mg, Take 40 mg by mouth daily.   triamcinolone cream (KENALOG) 0.1 %, Apply topically 2 (two) times daily.  Medical History:  Past Medical History:  Diagnosis Date   Abdominal pain, left lower quadrant 03/27/2014   Abnormal CXR 05/03/2019   05/03/2019   Walked RA  2 laps @  approx 282f each @ fast pace  stopped due to  End of study with sats 95% at end     Abnormal Pap smear    Achalasia    esphog stretched several times   Acute pain of both knees 09/07/2018   Allergy    seasonal allergies   Anxiety    on meds   Arthritis    generalized-on meds   Asthma    rarely uses inhaler-PRN-stopped smoking-symptoms decreased   ASTHMA 10/09/2008   Spirometry wnl  08/08/14 with active symptoms attributed to [place]    [facility]  [facility]    Cardiomyopathy, idiopathic ([place] 01/09/2014   CHF (congestive heart failure) ([place] 06/[place]   on meds   Chronic bilateral low back pain 09/07/2018   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 01/18/2018   Chronically on opiate therapy 01/18/2018   Cigarette smoker 08/09/2014   Constipation due to opioid therapy    Depression    DEPRESSION 10/09/2008   Qualifier: Diagnosis of  By: RTilden Dome -on meds   Dysphagia 08/10/2009   Qualifier: Diagnosis of  By: EShane Crutch Amy S    Dyspnea 08/08/2014   Followed in Pulmonary clinic/ [facility]/ [place]  - 08/08/2014  Walked RA x 3 laps @ 185 ft each stopped due to  End of study, no sob or desat - Spirometry  08/08/14 wnl     Dysrhythmia    Esophageal reflux 11/30/2008   Centricity Description: ESOPHAGEAL REFLUX Qualifier: Diagnosis of  By: KDeatra InaMD, RSandy Salaam Centricity Description: GERD Qualifier: Diagnosis of  By: EShane Crutch Amy S    ESOPHAGEAL STRICTURE 08/10/2009   Qualifier: Diagnosis of  By: EShane Crutch Amy S    Excessive daytime sleepiness 01/18/2018   Fibromyalgia    on meds  Gallstones and inflammation of gallbladder without obstruction 01/23/2015   GERD (gastroesophageal reflux disease)    on meds - diet controlled   Globus sensation    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   Heart murmur    Heart palpitations    tx with atenolol by steven klein    Hiatal hernia    History of abnormal Pap smear    History of chlamydia 1990's   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   HSV-2 infection    HTN (hypertension)    on meds   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   Occipital neuralgia of right side 04/21/2017   Palpitation 04/11/2011   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   Pneumonia 05/2013   PVC's (premature ventricular contractions)    S/P laparoscopic surgery 01/23/2015   Shortness of breath dyspnea    Sleep disorder, circadian, 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   Status post dilation of esophageal narrowing    Unspecified constipation 11/30/2008   Centricity Description: UNSPECIFIED CONSTIPATION Qualifier: Diagnosis of  By: Deatra Ina MD, Sandy Salaam  Centricity Description: CONSTIPATION Qualifier: Diagnosis of  By: Shane Crutch, Amy S    Upper airway cough syndrome 09/05/2014   Followed in Pulmonary clinic/ [facility]/ [place]  Onset  2002 p tonsilectomy  -Spirometry wnl  08/08/14  - referred to [facility] 09/05/2014 > inconclusive w/u by ENT / GI s f/u ov by either since 2016 > referred  Back to Dr Joya Gaskins 10/20/2017  - Dg Es 01/27/2019  Spontaneous gastroesophageal reflux. 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   Allergies:  Allergies  Allergen Reactions   Dust Mite Extract Shortness Of Breath   Other Shortness Of Breath    Feathers, trees "almost everything outside"   Pollen Extract Shortness Of Breath     Surgical History:  She  has a past surgical history that includes Tonsillectomy; Adenoidectomy (12/2002); Esophagogastroduodenoscopy; Dilation and curettage of uterus (1999); Breast surgery (07/2001); Dilation and curettage of uterus (01/2006); Bladder suspension (06/11/2012); Laparoscopic tubal ligation (06/11/2012); Cystoscopy (06/11/2012); Foreign Body Removal (06/11/2012); Cholecystectomy (N/A, 01/23/2015); Esophageal manometry (N/A, 02/23/2019); Colonoscopy (2020); Polypectomy (2020); Esophagogastroduodenoscopy (egd) with propofol (N/A, 09/25/2020); Savory dilation (N/A, 09/25/2020); Esophagogastroduodenoscopy (egd) with propofol (N/A, 01/30/2022); and Esophageal dilation (01/30/2022). Family History:  Her family history includes Arthritis in her mother and sister; Asthma in her mother and sister; Breast cancer in her mother; Colon cancer (age of onset: 59) in her paternal uncle; Colon polyps (age of onset: 62) in her father and paternal uncle; Heart attack in her mother; Heart disease in her father; Hypertension in her father and mother; Neuropathy in her mother and sister; Peripheral Artery Disease (age of onset: 8) in her mother; Stomach cancer (age of onset: 83) in her paternal uncle; Stroke in her mother.  REVIEW OF SYSTEMS  : All other systems reviewed and negative except where noted in the History of Present Illness.  PHYSICAL EXAM: There were no vitals taken for this visit. General Appearance: Well nourished, in no apparent distress. Head:   Normocephalic and  atraumatic. Eyes:  sclerae anicteric,conjunctive pink  Respiratory: Respiratory effort normal, BS equal bilaterally without rales, rhonchi,  wheezing. Cardio: RRR with no MRGs. Peripheral pulses intact.  Abdomen: Soft,  {BlankSingle:19197::"Flat","Obese","Non-distended"} ,active bowel sounds. {actendernessAB:27319} tenderness {anatomy; site abdomen:5010}. {BlankMultiple:19196::"Without guarding","With guarding","Without rebound","With rebound"}. No masses. Rectal: {acrectalexam:27461} Musculoskeletal: Full ROM, {PSY - GAIT AND STATION:22860} gait. {With/Without:304960234} edema. Skin:  Dry and intact without significant lesions or rashes Neuro: Alert and  oriented x4;  No focal deficits. Psych:  Cooperative. Normal mood and affect.    Vladimir Crofts, PA-C 9:59 AM

## 2023-02-18 ENCOUNTER — Ambulatory Visit: Payer: Medicare HMO | Admitting: Physician Assistant

## 2023-03-03 ENCOUNTER — Other Ambulatory Visit (HOSPITAL_COMMUNITY): Payer: Self-pay

## 2023-03-03 MED ORDER — OXYCODONE-ACETAMINOPHEN 10-325 MG PO TABS
1.0000 | ORAL_TABLET | Freq: Four times a day (QID) | ORAL | 0 refills | Status: AC
Start: 2023-03-03 — End: ?
  Filled 2023-03-03: qty 120, 30d supply, fill #0

## 2023-03-17 ENCOUNTER — Telehealth: Payer: Self-pay | Admitting: Internal Medicine

## 2023-03-17 NOTE — Telephone Encounter (Signed)
New Message;    Patient would like for Dr Margaretann Loveless to please order a doppler and an Echo. She said she really heed the doppler for her legs. She said she have ben having some problems with her legs.       New Message:    Please call, patient says her legs and feet are cold and swollen. She is also having lot of pain in her feet.  She says her toenails look purple.. She says she have poor circulation in both legs, but the left leg is worse

## 2023-03-17 NOTE — Telephone Encounter (Signed)
Patient called for swelling and discoloration ("bad circulation" to my feet").  She wants test done to see what to do.  She gives a lot of information and during conversation realize that she has had this problem for a year.  She has not been seen since 2022 in this office.  Advised she will need to be seen prior to any orders.  She ask for echo as she hasn't had one to check  "my Ejection Fraction in a long time".  She also ask for a doppler because a doctor came to see her and said it was "hard to hear in my feet".  She has no swelling to legs or feet.  She has some swelling to ankles non pitting.  She states her toes are not pink as her fingernails.  They change sometimes from bluish purple, then normal and sometimes like a gray on two or three of her toes. She states they are more cold than warm and the pain when she gets up is bad.  She also states this is when she sees the swelling, but resolves in the daytime. She then states she has a paper she needs signed for dental procedure needs signing when she comes in.  She states jaw pain but when discussed more she states it is her chin and the tip of her chin due to teeth.  Advised this would not necessarily be able to be signed at visit because she may need test prior to clearance. Sent to scheduling to get appointment.

## 2023-03-18 NOTE — Telephone Encounter (Signed)
Believe this is a patient who belongs to Dr. Margaretann Loveless.   [name]

## 2023-03-19 NOTE — Telephone Encounter (Signed)
Patient is scheduled 03/31/23 at 8:50a m

## 2023-03-21 NOTE — Progress Notes (Deleted)
Cardiology Office Note:    Date:  03/21/2023   ID:  Raven [name], DOB [birth year], MRN [id number]  PCP:  Raven Landsman, MD  Cardiologist:  Raven Munroe, MD  Electrophysiologist:  None   Referring MD: Raven Landsman, MD   Chief Complaint: leg swelling and discoloration  History of Present Illness:    Raven [name] is a 42 y.o. female with a history of non-ischemic cardiomyopathy/ chronic HFrEF with EF of 34% on cardiac MRI in 02/2021, palpitations with known symptomatic PVCs, hypertension, GERD, achalasia s/p esophageal dilatation, vocal cord paralysis, and anxiety who is followed by Dr. Margaretann [name] and presents today for evaluation of leg swelling and discoloration.  Patient has a long history of palpitations. Remote monitor reportedly showed some accelerated idioventricular rhythm. She was followed by Dr. Caryl [name] remotely for this. Echo in 11/2013 showed LVEF of 40-45% with anterolateral, anterior, and anteroseptal wall hypokinesis. Myoview in 12/2013 showed no evidence of ischemia.  She was re-referred to Dr. Margaretann [name] in 05/2020 after not having been seen by Cardiology for several years at which time she reported frequent palpitations but denied any other cardiac symptoms. Repeat Echo and Event Monitor were ordered for further evaluation. Monitored showed rare PVC /PACs and one 4 beat run of NSVT. Triggered events correlated with PVCs. Echo showed LVEF of 30-35% with global hypokinesis and grade 1 diastolic dysfunction.  Cardiac MRI was in 02/2021 showed normal size LV with EF of 34% and normal RV size and function. Also showed mid-myocardial stripe of delayed enhancement in LV septum, anterior, and inferior walls (non-specific finding) but no myocardial edema as well as ECV 30%. Findings were consistent with non-ischemic cardiomyopathy. She has had trouble taking GDMT due to dysphagia. She was last seen in our Pharm[facility] for GDMT optimization at which time she was started on Lisinopril daily and  Spironolactone every other day in addition to the Metoprolol that she was already taking.   Patient called our office on 03/17/2023 requesting a lower extremity dopplers and an Echo due to leg pain that she had been having. She reported swelling and discoloration of her legs. Therefore, this visit was arranged for further evaluation.   ***  Non-Ischemic Cardiomyopathy Chronic HFrEF Last Echo in 06/2020 showed LVEF of 30-35% with global hypokinesis and grade 1 diastolic dysfunction.  Cardiac MRI was in 02/2021 showed normal size LV with EF of 34% and normal RV size and function. Also showed mid-myocardial stripe of delayed enhancement in LV septum, anterior, and inferior walls (non-specific finding) but no myocardial edema as well as ECV 30%. Findings were consistent with non-ischemic cardiomyopathy. - *** - Continue Lasix 40mg  daily.  - Continue Lisinopril 5mg  daily.  - Will transition Lopressor 12.5mg  twice daily to Toprol-XL 25mg  daily given reduced EF. *** - Continue Spironolactone 12.5mg  every other day. *** - Discussed the importance of daily weights and sodium/fluid restrictions.  Palpitations Symptomatic PVCs Remote monitor reportedly showed some accelerated idioventricular rhythm. More recent monitor in 05/2020 showed rare PAC/PVC and a 4 beat run of NSVT but no significant arrhythmias.  Triggered events correlated with PVCs. - *** - Will transition Lopressor to Toprol-XL as above.  Hypertension BP *** - Continue medications for CHF as above.   Past Medical History:  Diagnosis Date   Abdominal pain, left lower quadrant 03/27/2014   Abnormal CXR 05/03/2019   05/03/2019   Walked RA  2 laps @  approx 252ft each @ fast pace  stopped due to  End of study  with sats 95% at end     Abnormal Pap smear    Achalasia    esphog stretched several times   Acute pain of both knees 09/07/2018   Allergy    seasonal allergies   Anxiety    on meds   Arthritis    generalized-on meds   Asthma     rarely uses inhaler-PRN-stopped smoking-symptoms decreased   ASTHMA 10/09/2008   Spirometry wnl  08/08/14 with active symptoms attributed to VCD     Blood transfusion 01/2006   [facility]   Cardiomyopathy [facility])    Cardiomyopathy, idiopathic ([place]) 01/09/2014   CHF (congestive heart failure) ([place]) 06/2020   on meds   Chronic bilateral low back pain 09/07/2018   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 01/18/2018   Chronically on opiate therapy 01/18/2018   Cigarette smoker 08/09/2014   Constipation due to opioid therapy    Depression    DEPRESSION 10/09/2008   Qualifier: Diagnosis of  By: Tilden Dome  -on meds   Dysphagia 08/10/2009   Qualifier: Diagnosis of  By: Shane Crutch, Amy S    Dyspnea 08/08/2014   Followed in Pulmonary clinic/ [facility]/ [place]  - 08/08/2014  Walked RA x 3 laps @ 185 ft each stopped due to  End of study, no sob or desat - Spirometry 08/08/14 wnl     Dysrhythmia    Esophageal reflux 11/30/2008   Centricity Description: ESOPHAGEAL REFLUX Qualifier: Diagnosis of  By: Deatra Ina MD, Sandy Salaam  Centricity Description: GERD Qualifier: Diagnosis of  By: Shane Crutch, Amy S    ESOPHAGEAL STRICTURE 08/10/2009   Qualifier: Diagnosis of  By: Shane Crutch, Amy S    Excessive daytime sleepiness 01/18/2018   Fibromyalgia    on meds   Gallstones and inflammation of gallbladder without obstruction 01/23/2015   GERD (gastroesophageal reflux disease)    on meds - diet controlled   Globus sensation    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   Heart murmur    Heart palpitations    tx with atenolol by steven klein    Hiatal hernia    History of abnormal Pap smear    History of chlamydia 1990's   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   HSV-2 infection    HTN (hypertension)    on meds   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   Occipital neuralgia of right side 04/21/2017   Palpitation 04/11/2011   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   Pneumonia 05/2013    PVC's (premature ventricular contractions)    S/P laparoscopic surgery 01/23/2015   Shortness of breath dyspnea    Sleep disorder, circadian, 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   Status post dilation of esophageal narrowing    Unspecified constipation 11/30/2008   Centricity Description: UNSPECIFIED CONSTIPATION Qualifier: Diagnosis of  By: Deatra Ina MD, Sandy Salaam  Centricity Description: CONSTIPATION Qualifier: Diagnosis of  By: Shane Crutch, Amy S    Upper airway cough syndrome 09/05/2014   Followed in Pulmonary clinic/ [facility]/ [place]  Onset  2002 p tonsilectomy  -Spirometry wnl  08/08/14  - referred to [place] [facility] 09/05/2014 > inconclusive w/u by ENT / GI s f/u ov by either since 2016 > referred  Back to Dr Joya Gaskins 10/20/2017  - Dg Es 01/27/2019  Spontaneous gastroesophageal reflux. 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    Past Surgical History:  Procedure Laterality Date   ADENOIDECTOMY  12/2002   BLADDER SUSPENSION  06/11/2012   Procedure: TRANSVAGINAL TAPE (TVT) PROCEDURE;  Surgeon: Delice Lesch, MD;  Location: [facility];  Service: Gynecology;  Laterality: N/A;  Tension Free Vaginal Tape/cystoscopy   BREAST SURGERY  07/2001   tumor removed right breast   CHOLECYSTECTOMY N/A 01/23/2015   Procedure: LAPAROSCOPIC CHOLECYSTECTOMY ;  Surgeon: Fanny Skates, MD;  Location: [facility];  Service: General;  Laterality: N/A;  case booked with cholangiogram but MD did not complete intraoperative cholangiogram    COLONOSCOPY  2020   1 polyp/TICS   CYSTOSCOPY  06/11/2012   Procedure: CYSTOSCOPY;  Surgeon: Delice Lesch, MD;  Location: [facility];  Service: Gynecology;  Laterality: N/A;   DILATION AND  CURETTAGE OF UTERUS  1999   DILATION AND CURETTAGE OF UTERUS  01/2006   retained products   ESOPHAGEAL DILATION  01/30/2022   Procedure: ESOPHAGEAL DILATION;  Surgeon: Mauri Pole, MD;  Location: [facility];  Service: Endoscopy;;   ESOPHAGEAL MANOMETRY N/A 02/23/2019   Procedure: ESOPHAGEAL MANOMETRY (EM);  Surgeon: Mauri Pole, MD;  Location: [facility];  Service: Endoscopy;  Laterality: N/A;   ESOPHAGOGASTRODUODENOSCOPY     with esophageal dilation x 3   ESOPHAGOGASTRODUODENOSCOPY (EGD) WITH PROPOFOL N/A 09/25/2020   Procedure: ESOPHAGOGASTRODUODENOSCOPY (EGD) WITH PROPOFOL;  Surgeon: Yetta Flock, MD;  Location: [facility];  Service: Gastroenterology;  Laterality: N/A;   ESOPHAGOGASTRODUODENOSCOPY (EGD) WITH PROPOFOL N/A 01/30/2022   Procedure: ESOPHAGOGASTRODUODENOSCOPY (EGD) WITH PROPOFOL;  Surgeon: Mauri Pole, MD;  Location: [facility];  Service: Endoscopy;  Laterality: N/A;   FOREIGN BODY REMOVAL  06/11/2012   Procedure: REMOVAL FOREIGN BODY EXTREMITY;  Surgeon: Delice Lesch, MD;  Location: [facility];  Service: Gynecology;  Laterality: Right;  Removal of Implanon    LAPAROSCOPIC TUBAL LIGATION  06/11/2012   Procedure: LAPAROSCOPIC TUBAL LIGATION;  Surgeon: Delice Lesch, MD;  Location: [facility];  Service: Gynecology;  Laterality: Bilateral;  Laparoscopic Bilateral Fulgeration   POLYPECTOMY  2020   tics/1 polyp   SAVORY DILATION N/A 09/25/2020   Procedure: SAVORY DILATION;  Surgeon: Yetta Flock, MD;  Location: [facility];  Service: Gastroenterology;  Laterality: N/A;   TONSILLECTOMY      Current Medications: No outpatient medications have been marked as taking for the 03/31/23 encounter (Appointment) with Darreld Mclean, PA-C.     Allergies:   Dust mite extract, Other, and Pollen extract   Social History   Socioeconomic History   Marital status: Single    Spouse name: Not on file   Number of children: 2   Years of education:  College   Highest education level: Not on file  Occupational History   Not on file  Tobacco Use   Smoking status: Former    Packs/day: 0.25    Years: 15.00    Additional pack years: 0.00    Total pack years: 3.75    Types: Cigarettes    Quit date: 04/29/2019    Years since quitting: 3.8   Smokeless tobacco: Never   Tobacco comments:    stopped smoking 04-29-2019  Vaping Use   Vaping Use: Never used  Substance and Sexual Activity   Alcohol use: No    Comment: special occassions  Drug use: No   Sexual activity: Yes    Birth control/protection: Surgical  Other Topics Concern   Not on file  Social History Narrative   Lives with fiance and children   Caffeine use:  Hot ginger tea- daily   Left-handed   Social Determinants of Health   Financial Resource Strain: Not on file  Food Insecurity: Not on file  Transportation Needs: Not on file  Physical Activity: Not on file  Stress: Not on file  Social Connections: Not on file     Family History: The patient's family history includes Arthritis in her mother and sister; Asthma in her mother and sister; Breast cancer in her mother; Colon cancer (age of onset: 52) in her paternal uncle; Colon polyps (age of onset: 70) in her father and paternal uncle; Heart attack in her mother; Heart disease in her father; Hypertension in her father and mother; Neuropathy in her mother and sister; Peripheral Artery Disease (age of onset: 64) in her mother; Stomach cancer (age of onset: 82) in her paternal uncle; Stroke in her mother. There is no history of Esophageal cancer or Rectal cancer.  ROS:   Please see the history of present illness.     EKGs/Labs/Other Studies Reviewed:    The following studies were reviewed:  Monitor 06/07/2020 to 07/06/2020: Minimum HR (bpm): 56 Maximum HR (bpm): 137   Supraventricular Ectopy: <1% SVT: none   Ventricular Ectopy: <1% NSVT: 4 beat run at rate of 137 bpm Ventricular Tachycardia: none   Pauses:  none AV block: none   Atrial fibrillation: none   Diary events: fluttering/skipped beats associated with PVCs  Impressions: Fluttering/skipped beats associated with PVCs  _______________  Echocardiogram 07/05/2020: Impressions: 1. Left ventricular ejection fraction, by estimation, is 30 to 35%. The  left ventricle has moderate to severely decreased function. The left  ventricle demonstrates global hypokinesis. There is mild left ventricular  hypertrophy. Left ventricular  diastolic parameters are consistent with Grade I diastolic dysfunction  (impaired relaxation).   2. Right ventricular systolic function is normal. The right ventricular  size is normal.   3. The mitral valve is normal in structure. Trivial mitral valve  regurgitation. No evidence of mitral stenosis.   4. The aortic valve is tricuspid. Aortic valve regurgitation is not  visualized. No aortic stenosis is present.   5. The inferior vena cava is normal in size with greater than 50%  respiratory variability, suggesting right atrial pressure of 3 mmHg.  _______________  Cardiac MRI 03/11/2021: Impression: 1. Severely decreased left ventricular function. Normal LV size by indexed volume. LVEF 34%. 2.  Normal right ventricular size and function.  RVEF 51%. 3. Mid-myocardial stripe of delayed enhancement in left ventricular septum, anterior and inferior walls, nonspecific finding. No myocardial edema. 4. ECV 30%, mildly increased, may be seen in hypertensive heart disease.   Findings consistent with nonischemic cardiomyopathy.   Recommendations: Guideline directed medical therapy for heart failure with reduced ejection fraction.   EKG:  EKG ordered today. EKG personally reviewed and demonstrates ***.  Recent Labs: No results found for requested labs within last 365 days.  Recent Lipid Panel No results found for: "CHOL", "TRIG", "HDL", "CHOLHDL", "VLDL", "LDLCALC", "LDLDIRECT"  Physical Exam:    Vital  Signs: There were no vitals taken for this visit.    Wt Readings from Last 3 Encounters:  01/14/22 191 lb (86.6 kg)  04/23/21 190 lb 3.2 oz (86.3 kg)  03/21/21 191 lb 6.4 oz (86.8 kg)  General: 42 y.o. female in no acute distress. HEENT: Normocephalic and atraumatic. Sclera clear. EOMs intact. Neck: Supple. No carotid bruits. No JVD. Heart: *** RRR. Distinct S1 and S2. No murmurs, gallops, or rubs. Radial and distal pedal pulses 2+ and equal bilaterally. Lungs: No increased work of breathing. Clear to ausculation bilaterally. No wheezes, rhonchi, or rales.  Abdomen: Soft, non-distended, and non-tender to palpation. Bowel sounds present in all 4 quadrants.  MSK: Normal strength and tone for age. *** Extremities: No lower extremity edema.    Skin: Warm and dry. Neuro: Alert and oriented x3. No focal deficits. Psych: Normal affect. Responds appropriately.   Assessment:    No diagnosis found.  Plan:     Disposition: Follow up in ***   Medication Adjustments/Labs and Tests Ordered: Current medicines are reviewed at length with the patient today.  Concerns regarding medicines are outlined above.  No orders of the defined types were placed in this encounter.  No orders of the defined types were placed in this encounter.   There are no Patient Instructions on file for this visit.   Liane Comber, PA-C  03/21/2023 8:03 PM    [place]

## 2023-03-31 ENCOUNTER — Ambulatory Visit: Payer: Medicare HMO | Admitting: Student

## 2023-04-22 NOTE — Telephone Encounter (Signed)
PT is calling to discuss scheduling another EGD. She expressed it needs to  be done in a hospital. She says that it was supposed to have been done but was never called. Please advise.

## 2023-04-22 NOTE — Telephone Encounter (Signed)
Dr. Adela Lank, please see note below. Patient has not been seen in the office since 12/2021. She no showed her 01/2023 OV with PA. Do you want her to be scheduled for OV or scheduled for direct EGD? She also follows with ENT. Please advise, thanks.

## 2023-04-23 ENCOUNTER — Other Ambulatory Visit: Payer: Self-pay

## 2023-04-23 DIAGNOSIS — R1311 Dysphagia, oral phase: Secondary | ICD-10-CM

## 2023-04-23 DIAGNOSIS — R1310 Dysphagia, unspecified: Secondary | ICD-10-CM

## 2023-04-23 NOTE — Telephone Encounter (Signed)
I think we can direct book her in the hospital for EGD with dilation as she has reliably responded to dilation in the past of upper esophageal stricture.  Can you book her in first available opening, I am not sure when that is right now. Please remind her she missed an appointment in February and she really needs to show up for an appointment if booked moving forward. Thanks

## 2023-04-23 NOTE — Telephone Encounter (Signed)
Called and spoke with patient. Pt is frustrated because she says that she has been waiting for someone to call her back for months to schedule a procedure. She has also been seen by ENT in [place] and they have not helped. I reviewed the last discussion I had with patient and she stated that she didn't want to wait until February to have procedure and she was advised to go to ED for evaluation if she had severe difficulty swallowing. Pt called in February 2024 to schedule an office visit that she no showed. Pt states that "no one ever called her about the appt". The office visit was scheduled with the patient. I told pt that it would be best to go ahead and get her scheduled for EGD with dilation now, if not it will be later this year before we can get her scheduled. Pt stated that she will schedule because she doesn't have any other choice.  Pt scheduled for EGD with dilation at [facility] on 06/15/23 at 9:30 am. Pt was advised to arrive at 8 am with a care partner. Pt confirmed that she has access to MyChart and she is aware that I will send her instructions there for her review. Pt had no other concerns at the end of the call.   Ambulatory referral to GI in epic. EGD instructions sent to patient via MyChart.

## 2023-04-23 NOTE — Addendum Note (Signed)
Addended by: Missy Sabins on: 04/23/2023 09:36 AM   Modules accepted: Orders

## 2023-04-27 NOTE — Progress Notes (Unsigned)
Cardiology Clinic Note   Date: 04/28/2023 ID: Chrishana, Spargur 1981-06-08, MRN [id number]  Primary Cardiologist:  Parke Poisson, MD  Patient Profile    Raven [name] is a 42 y.o. female who presents to the clinic today for follow-up.  Past medical history significant for: Chronic systolic heart failure/nonischemic cardiomyopathy. Echo 07/05/2020: EF 30 to 35%.  Global hypokinesis.  Mild LVH.  Grade I DD.  Trivial MR. Cardiac MRI 03/11/2021: Severely decreased LV function.  Normal LV size.  LVEF 34%.  Normal RV size and function.  RVEF 51%.  Mid myocardial stripe of delayed enhancement in the left ventricular septum, anterior inferior walls, nonspecific finding.  No myocardial edema.  ECV 30%, mildly increased, may be seen in hypertensive heart disease.  Findings consistent with nonischemic cardiomyopathy. Palpitations/PVCs. Event monitor 06/07/2020: Min HR 56 bpm, max HR 137 bpm.  NSVT 4 beat run, rate 137 bpm.  Less than 1% ventricular ectopy.  Fluttering/skipped beats associated with PVCs. Hypertension. GERD.   History of Present Illness    Raven [name] is a longtime patient of cardiology initially followed by Dr. Graciela Husbands.  She was first seen by Dr. Jacques Navy on 05/29/2020 for further evaluation of hypertension, palpitations and follow-up of nonischemic cardiomyopathy.  She is followed for the above outlined history.  Patient was last seen in the office by Dr. Jacques Navy on 02/01/2021 for follow-up.  Patient had not been able to tolerate GDMT for chronic systolic heart failure and was trialed on Lopressor.  Due to patient living in [place] plan was to follow-up with Dr. Servando Salina for convenience and women's health expertise.  Patient was lost in follow-up.  Patient contacted triage on 03/17/2023 with request for testing for leg pain and swelling: "Patient called for swelling and discoloration ("bad circulation" to my feet").  She wants test done to see what to do.  She gives a lot of  information and during conversation realize that she has had this problem for a year.  She has not been seen since 2022 in this office.  Advised she will need to be seen prior to any orders.  She ask for echo as she hasn't had one to check  "my Ejection Fraction in a long time".  She also ask for a doppler because a doctor came to see her and said it was "hard to hear in my feet".  She has no swelling to legs or feet.  She has some swelling to ankles non pitting.  She states her toes are not pink as her fingernails.  They change sometimes from bluish purple, then normal and sometimes like a gray on two or three of her toes. She states they are more cold than warm and the pain when she gets up is bad.  She also states this is when she sees the swelling, but resolves in the daytime. She then states she has a paper she needs signed for dental procedure needs signing when she comes in.  She states jaw pain but when discussed more she states it is her chin and the tip of her chin due to teeth.  Advised this would not necessarily be able to be signed at visit because she may need test prior to clearance. Sent to scheduling to get appointment."  Today, patient is here for concerns of bilateral lower extremity edema L>R.  Patient reports she is pain in bilateral legs at rest.  She reports legs become cold to touch and toes tingle left more than  right.  She also reports shortness of breath at rest typically associated with palpitations/fluttering in her chest.  Episodes of palpitations can last for 60 minutes and then resolve on their own before returning.  She has not had shortness of breath or palpitations in the last 2 to 3 weeks.  She denies dyspnea with exertion.  She stays very active walking daily and jumping rope for 20 minutes at a time 3 times a week.  She has not been taking any medications for over 6 months.  She feels medications were making her feel worse.  She would like to have a repeat echo to "know what  my heart function is doing."  She denies chest pain, pressure, tightness.  She reports she needs several dental procedures including wisdom tooth removal" bone shaving."  There is no official request in our system.  She will contact them to have it sent.    ROS: All other systems reviewed and are otherwise negative except as noted in History of Present Illness.  Studies Reviewed    ECG personally reviewed by me today: Sinus rhythm with occasional PVC, 92 bpm.  No significant changes from 02/01/2021.  No ectopy.           Physical Exam    VS:  BP 124/80 (BP Location: Left Arm, Patient Position: Sitting, Cuff Size: Normal)   Pulse 92   Ht 5\' 9"  (1.753 m)   Wt 193 lb (87.5 kg)   BMI 28.50 kg/m  , BMI Body mass index is 28.5 kg/m.  GEN: Well nourished, well developed, in no acute distress. Neck: No JVD or carotid bruits. Cardiac:  RRR.  Occasional extrasystole.  No murmurs. No rubs or gallops.   Respiratory:  Respirations regular and unlabored. Clear to auscultation without rales, wheezing or rhonchi. GI: Soft, nontender, nondistended. Extremities: Radials/DP/PT 2+ and equal bilaterally. No clubbing or cyanosis. No edema.  Skin: Warm and dry, no rash. Neuro: Strength intact.  Assessment & Plan    Chronic systolic heart failure/nonischemic cardiomyopathy.  Echo July 2021 showed EF 30 to 35%, mild LVH, Grade I DD.  Cardiac MRI March 2022 had findings consistent with nonischemic cardiomyopathy.  Patient had been unable to tolerate GDMT in the past.  Patient has not been taking medications for 6 months.  She reports intermittent bilateral lower extremity edema.  No dyspnea with exertion.  She has mild shortness of breath at rest typically associated with palpitations/fluttering.  Episodes will last 60 minutes and resolve on their own before returning.  She has not had any of these episodes in the 2 to 3 weeks.  Euvolemic and well compensated on exam.  Will get echo.  Patient agrees to start  metoprolol 12.5 mg twice daily.  She is not edematous today so we will forego diuretic for the time being. Lower extremity pain.  Patient reports bilateral lower extremity pain and occasional edema with temperature change.  Patient reports legs will become cold to the touch and toes will tingle left more than right.  Skin is warm and dry today.  2+ PT/DP pulses.  Will get ABIs and arterial ultrasound of bilateral legs for further evaluation. Palpitations.  Event monitor June 2021 showed less than 1% ventricular ectopy, 4 beat run of NSVT.  Patient has a known history of PVCs.  She feels palpitations maybe lasting longer than previously.  EKG shows occasional PVCs.  Occasional ectopy heard on auscultation.  I do not think repeat ZIO is indicated at this time and  patient is in agreement.  Will get CBC, BMP, TSH. Hypertension. BP today 124/80.  Patient denies headaches, dizziness or vision changes.  Will restart metoprolol 12.5 mg twice daily. Preoperative cardiovascular risk assessment.  Dental procedures to include wisdom teeth removal and "bone shavings."  Official request has not been sent patient will contact dental office to have that sent in. According to the RCRI, patient has a 0.9% risk of MACE. Patient reports activity equivalent to 8.91 METS (per DASI).  Will defer final recommendations until echo is performed and official request is sent in.    Disposition: TSH, CBC, BMP today.  Echo.  ABI/arterial ultrasound.  Metoprolol 12.5 mg daily.  Return in 6 to 8 weeks or sooner as needed.         Signed, Etta Grandchild. Heaven Meeker, DNP, NP-C

## 2023-04-28 ENCOUNTER — Ambulatory Visit: Payer: Medicare HMO | Attending: Student | Admitting: Student

## 2023-04-28 ENCOUNTER — Encounter: Payer: Self-pay | Admitting: Student

## 2023-04-28 VITALS — BP 124/80 | HR 92 | Ht 69.0 in | Wt 193.0 lb

## 2023-04-28 DIAGNOSIS — I10 Essential (primary) hypertension: Secondary | ICD-10-CM

## 2023-04-28 DIAGNOSIS — R002 Palpitations: Secondary | ICD-10-CM | POA: Diagnosis not present

## 2023-04-28 DIAGNOSIS — I428 Other cardiomyopathies: Secondary | ICD-10-CM | POA: Diagnosis not present

## 2023-04-28 DIAGNOSIS — M79604 Pain in right leg: Secondary | ICD-10-CM | POA: Diagnosis not present

## 2023-04-28 DIAGNOSIS — Z01810 Encounter for preprocedural cardiovascular examination: Secondary | ICD-10-CM

## 2023-04-28 DIAGNOSIS — I5022 Chronic systolic (congestive) heart failure: Secondary | ICD-10-CM | POA: Diagnosis not present

## 2023-04-28 DIAGNOSIS — M79605 Pain in left leg: Secondary | ICD-10-CM

## 2023-04-28 NOTE — Patient Instructions (Signed)
Medication Instructions:  Your physician recommends that you continue on your current medications as directed. Please refer to the Current Medication list given to you today.  *If you need a refill on your cardiac medications before your next appointment, please call your pharmacy*   Lab Work: Your physician recommends that you have the following labs drawn: TSH, CBC, and BMET.  If you have labs (blood work) drawn today and your tests are completely normal, you will receive your results only by: MyChart Message (if you have MyChart) OR A paper copy in the mail If you have any lab test that is abnormal or we need to change your treatment, we will call you to review the results.   Testing/Procedures: Your physician has requested that you have an echocardiogram. Echocardiography is a painless test that uses sound waves to create images of your heart. It provides your doctor with information about the size and shape of your heart and how well your heart's chambers and valves are working. This procedure takes approximately one hour. There are no restrictions for this procedure. Please do NOT wear cologne, perfume, aftershave, or lotions (deodorant is allowed). Please arrive 15 minutes prior to your appointment time.  Your physician has requested that you have an ankle brachial index (ABI). During this test an ultrasound and blood pressure cuff are used to evaluate the arteries that supply the arms and legs with blood. Allow thirty minutes for this exam. There are no restrictions or special instructions.  Your physician has requested that you have a lower extremity arterial duplex. This test is an ultrasound of the arteries in the legs. It looks at arterial blood flow in the legs. Allow one hour for Lower Arterial scans. There are no restrictions or special instructions      Follow-Up: At [facility], you and your health needs are our priority.  As part of our continuing mission to  provide you with exceptional heart care, we have created designated Provider Care Teams.  These Care Teams include your primary Cardiologist (physician) and Advanced Practice Providers (APPs -  Physician Assistants and Nurse Practitioners) who all work together to provide you with the care you need, when you need it.  We recommend signing up for the patient portal called "MyChart".  Sign up information is provided on this After Visit Summary.  MyChart is used to connect with patients for Virtual Visits (Telemedicine).  Patients are able to view lab/test results, encounter notes, upcoming appointments, etc.  Non-urgent messages can be sent to your provider as well.   To learn more about what you can do with MyChart, go to [email or web address].    Your next appointment:   6-8 week(s)  Provider:   Carlos Levering, NP

## 2023-04-29 LAB — BASIC METABOLIC PANEL
BUN/Creatinine Ratio: 13 (ref 9–23)
BUN: 10 mg/dL (ref 6–24)
CO2: 20 mmol/L (ref 20–29)
Calcium: 9.5 mg/dL (ref 8.7–10.2)
Chloride: 105 mmol/L (ref 96–106)
Creatinine, Ser: 0.77 mg/dL (ref 0.57–1.00)
Glucose: 83 mg/dL (ref 70–99)
Potassium: 4.7 mmol/L (ref 3.5–5.2)
Sodium: 141 mmol/L (ref 134–144)
eGFR: 99 mL/min/{1.73_m2} (ref >59)

## 2023-04-29 LAB — CBC
Hematocrit: 41.2 % (ref 34.0–46.6)
Hemoglobin: 13.8 g/dL (ref 11.1–15.9)
MCH: 31.2 pg (ref 26.6–33.0)
MCHC: 33.5 g/dL (ref 31.5–35.7)
MCV: 93 fL (ref 79–97)
Platelets: 221 10*3/uL (ref 150–450)
RBC: 4.43 x10E6/uL (ref 3.77–5.28)
RDW: 12.7 % (ref 11.7–15.4)
WBC: 8.9 10*3/uL (ref 3.4–10.8)

## 2023-04-29 LAB — TSH: TSH: 1.01 u[IU]/mL (ref 0.450–4.500)

## 2023-04-30 ENCOUNTER — Telehealth: Payer: Self-pay | Admitting: Internal Medicine

## 2023-04-30 MED ORDER — METOPROLOL TARTRATE 25 MG PO TABS: 12.5000 mg | ORAL_TABLET | Freq: Two times a day (BID) | ORAL | 1 refills | Status: DC

## 2023-04-30 NOTE — Telephone Encounter (Signed)
*  STAT* If patient is at the pharmacy, call can be transferred to refill team.   1. Which medications need to be refilled? (please list name of each medication and dose if known)  metoprolol tartrate (LOPRESSOR) 25 MG tablet   2. Which pharmacy/location (including street and city if local pharmacy) is medication to be sent to? [id number] - [place], [place] - [address] AT [address]   3. Do they need a 30 day or 90 day supply? 90 day supply   

## 2023-04-30 NOTE — Telephone Encounter (Signed)
Refill sent to pharmacy. Patient called.

## 2023-05-13 ENCOUNTER — Ambulatory Visit (HOSPITAL_COMMUNITY): Payer: Medicare HMO

## 2023-05-26 ENCOUNTER — Ambulatory Visit (HOSPITAL_COMMUNITY)
Admission: RE | Admit: 2023-05-26 | Discharge: 2023-05-26 | Disposition: A | Discharge status: Home / Self Care | Payer: Medicare HMO | Source: Ambulatory Visit | Attending: Internal Medicine | Admitting: Internal Medicine

## 2023-05-26 DIAGNOSIS — M79604 Pain in right leg: Secondary | ICD-10-CM | POA: Diagnosis present

## 2023-05-26 DIAGNOSIS — M79605 Pain in left leg: Secondary | ICD-10-CM | POA: Diagnosis present

## 2023-05-27 LAB — VAS US ABI WITH/WO TBI
Left ABI: 1.23
Right ABI: 1.2

## 2023-05-28 ENCOUNTER — Ambulatory Visit (HOSPITAL_COMMUNITY): Payer: Medicare HMO | Attending: Student

## 2023-05-28 DIAGNOSIS — I5022 Chronic systolic (congestive) heart failure: Secondary | ICD-10-CM | POA: Insufficient documentation

## 2023-05-28 LAB — ECHOCARDIOGRAM COMPLETE
Area-P 1/2: 2.94 cm2
S' Lateral: 3.65 cm

## 2023-06-04 ENCOUNTER — Encounter (HOSPITAL_COMMUNITY): Payer: Self-pay | Admitting: Gastroenterology

## 2023-06-05 NOTE — Progress Notes (Signed)
Cardiology Clinic Note   Date: 06/09/2023 ID: Giordana, Heinkel May 19, 1981, MRN [id number]  Primary Cardiologist:  Parke Poisson, MD  Patient Profile    Raven [name] is a 42 y.o. female who presents to the clinic today for follow up after cardiac testing.   Past medical history significant for: Chronic systolic heart failure/nonischemic cardiomyopathy. Cardiac MRI 03/11/2021: Severely decreased LV function.  Normal LV size.  LVEF 34%.  Normal RV size and function.  RVEF 51%.  Mid myocardial stripe of delayed enhancement in the left ventricular septum, anterior inferior walls, nonspecific finding.  No myocardial edema.  ECV 30%, mildly increased, may be seen in hypertensive heart disease.  Findings consistent with nonischemic cardiomyopathy. Echo 05/28/2023: EF 30 to 35%.  Global hypokinesis.  Normal RV function.  Trivial MR. Palpitations/PVCs. Event monitor 06/07/2020: Min HR 56 bpm, max HR 137 bpm.  NSVT 4 beat run, rate 137 bpm.  Less than 1% ventricular ectopy.  Fluttering/skipped beats associated with PVCs. Hypertension. GERD.   History of Present Illness    Raven [name] is a longtime patient of cardiology initially followed by Dr. Graciela Husbands. She was first seen by Dr. Jacques Navy on 05/29/2020 for further evaluation of hypertension, palpitations and follow-up of nonischemic cardiomyopathy. She is followed for the above outlined history.   Patient was last seen in the office by me on 04/28/2023 for complaints of L>R lower extremity edema. She complained of bilateral lower extremities at rest and legs become cold to touch with tingling toes L>R.  She also reported palpitations/fluttering with associated shortness of breath.  Patient had not been taking her medications for >6 months.  She agreed to start low-dose metoprolol and have repeat echo.  ABIs to evaluate her leg symptoms were normal bilaterally.  Echo was essentially unchanged from 2021 showing an EF of 30 to 35% with global  hypokinesis.  Today, patient is accompanied by her daughter. She is doing well. No chest pain, pressure, or tightness. She reports dyspnea with heavy exertion but none with routine activities. She continues to have lower extremity pain with occasional edema. Denies orthopnea or PND. She does not weigh daily. She has been tolerating Metoprolol well. She is having difficulty swallowing for which she is going to be evaluated with an EGD. She crushes her medications because if she swallows them whole they get stuck. She continues to walk for exercise. She is a [employer] of the [employer] and since recently getting a car she plans to start exercising there. She also cares for her 41 year old grandson. Palpitations are worse at night and described as skipping and racing. She is concerned about starting any more medications stating "I don't want anything to happen to the rest of my organs like my kidneys." Utilized shared decision making to take a slow approach to getting patient on best GDMT.        ROS: All other systems reviewed and are otherwise negative except as noted in History of Present Illness.  Studies Reviewed    ECG is not ordered today.       Physical Exam    VS:  BP 124/84 (BP Location: Right Arm, Patient Position: Sitting, Cuff Size: Normal)   Pulse 91   Ht 5\' 9"  (1.753 m)   Wt 193 lb (87.5 kg)   LMP 05/25/2023   SpO2 99%   BMI 28.50 kg/m  , BMI Body mass index is 28.5 kg/m.  GEN: Well nourished, well developed, in no acute distress.  Neck: No JVD or carotid bruits. Cardiac:  RRR. No murmurs. No rubs or gallops.   Respiratory:  Respirations regular and unlabored. Clear to auscultation without rales, wheezing or rhonchi. GI: Soft, nontender, nondistended. Extremities: Radials/DP/PT 2+ and equal bilaterally. No clubbing or cyanosis. No edema.  Skin: Warm and dry, no rash. Neuro: Strength intact.  Assessment & Plan    Chronic systolic heart failure/nonischemic cardiomyopathy.  Cardiac  MRI March 2022 had findings consistent with nonischemic cardiomyopathy. Echo May 2024 showed EF 30 to 35%. Patient reports dyspnea with heavy exertion but not with routine activities. No PND or orthopnea. She has occasional lower extremity edema particularly in bilateral ankles. She does not weigh every day. Breath sounds are clear to auscultation. Euvolemic and well compensated on exam.  Start losartan 25 mg daily.  Continue metoprolol. She cannot take Entresto as it cannot be crushed. Instructed to weigh daily and contact office for weight gain of 3 lb overnight and 5 lb in a week.  Lower extremity pain.  ABIs May 2024 were normal.  Patient reports continued lower extremity pain and numbness. She is considering seeing neurology for further evaluation. She is encouraged to speak to PCP.  Palpitations.  Event monitor June 2021 showed less than 1% ventricular ectopy, 4 beat run of NSVT.  Patient has a known history of PVCs.  She reports palpitations are worse at night when she is trying to sleep. RRR on auscultation today. Continue metoprolol.  Patient is instructed to take an extra half of Metoprolol if she has sustained palpitations at night.  Hypertension. BP today 124/84. Marland Kitchen  Patient denies headaches, dizziness or vision changes.Continue metoprolol. Add losartan (see #1).  Preoperative cardiovascular risk assessment.  Dental procedures to include wisdom teeth removal and "bone shavings."  According to the RCRI, patient has a 0.9% risk of MACE. Patient reports activity equivalent to 8.91 METS (per DASI). Based on ACC/AHA guidelines, [name] would be at acceptable risk for the planned procedure without further cardiovascular testing.   Disposition: Losartan 25 mg daily. Refer to advanced heart failure clinic. Weight daily - contact office for weight increase 3 lb overnight, 5 lb in a week. BMP in 1 week. Return in 6 months or sooner as needed.          Signed, Etta Grandchild. Shayana Hornstein, DNP,  NP-C

## 2023-06-09 ENCOUNTER — Encounter: Payer: Self-pay | Admitting: Student

## 2023-06-09 ENCOUNTER — Ambulatory Visit: Payer: Medicare HMO | Attending: Student | Admitting: Student

## 2023-06-09 VITALS — BP 124/84 | HR 91 | Ht 69.0 in | Wt 193.0 lb

## 2023-06-09 DIAGNOSIS — I428 Other cardiomyopathies: Secondary | ICD-10-CM | POA: Diagnosis not present

## 2023-06-09 DIAGNOSIS — I5022 Chronic systolic (congestive) heart failure: Secondary | ICD-10-CM | POA: Diagnosis not present

## 2023-06-09 DIAGNOSIS — M79604 Pain in right leg: Secondary | ICD-10-CM | POA: Diagnosis not present

## 2023-06-09 DIAGNOSIS — Z79899 Other long term (current) drug therapy: Secondary | ICD-10-CM | POA: Diagnosis not present

## 2023-06-09 DIAGNOSIS — Z01810 Encounter for preprocedural cardiovascular examination: Secondary | ICD-10-CM

## 2023-06-09 DIAGNOSIS — I10 Essential (primary) hypertension: Secondary | ICD-10-CM

## 2023-06-09 DIAGNOSIS — M79605 Pain in left leg: Secondary | ICD-10-CM

## 2023-06-09 DIAGNOSIS — R002 Palpitations: Secondary | ICD-10-CM

## 2023-06-09 MED ORDER — LOSARTAN POTASSIUM 25 MG PO TABS: 25.0000 mg | ORAL_TABLET | Freq: Every day | ORAL | 3 refills | Status: DC

## 2023-06-09 NOTE — Patient Instructions (Signed)
Medication Instructions:  Your physician has recommended you make the following change in your medication:  START: Losartan 25mg  daily  *If you need a refill on your cardiac medications before your next appointment, please call your pharmacy*  Please weigh yourself daily. If you gain 3lbs overnight or 5lbs in a week please give our office a call for further instructions.    Lab Work: Your physician recommends that you return in 1 week to have the following lab work drawn: BMET  If you have labs (blood work) drawn today and your tests are completely normal, you will receive your results only by: MyChart Message (if you have MyChart) OR A paper copy in the mail If you have any lab test that is abnormal or we need to change your treatment, we will call you to review the results.   Testing/Procedures: NONE   Follow-Up: At [facility], you and your health needs are our priority.  As part of our continuing mission to provide you with exceptional heart care, we have created designated Provider Care Teams.  These Care Teams include your primary Cardiologist (physician) and Advanced Practice Providers (APPs -  Physician Assistants and Nurse Practitioners) who all work together to provide you with the care you need, when you need it.  We recommend signing up for the patient portal called "MyChart".  Sign up information is provided on this After Visit Summary.  MyChart is used to connect with patients for Virtual Visits (Telemedicine).  Patients are able to view lab/test results, encounter notes, upcoming appointments, etc.  Non-urgent messages can be sent to your provider as well.   To learn more about what you can do with MyChart, go to [email or web address].    Your next appointment:   6 month(s)  Provider:   Parke Poisson, MD     Other Instructions We have placed a referral for you to be seen at our advanced heart failure clinic. Their office will give you a call and get you  scheduled to see one of our providers.

## 2023-06-15 ENCOUNTER — Other Ambulatory Visit: Payer: Self-pay

## 2023-06-15 ENCOUNTER — Encounter (HOSPITAL_COMMUNITY)
Admission: RE | Disposition: A | Discharge status: Home / Self Care | Payer: Self-pay | Source: Ambulatory Visit | Attending: Gastroenterology

## 2023-06-15 ENCOUNTER — Encounter (HOSPITAL_COMMUNITY): Payer: Self-pay | Admitting: Gastroenterology

## 2023-06-15 ENCOUNTER — Ambulatory Visit (HOSPITAL_COMMUNITY)
Admission: RE | Admit: 2023-06-15 | Discharge: 2023-06-15 | Disposition: A | Discharge status: Home / Self Care | Payer: Medicare HMO | Source: Ambulatory Visit | Attending: Gastroenterology | Admitting: Gastroenterology

## 2023-06-15 ENCOUNTER — Ambulatory Visit (HOSPITAL_BASED_OUTPATIENT_CLINIC_OR_DEPARTMENT_OTHER): Payer: Medicare HMO | Admitting: Anesthesiology

## 2023-06-15 ENCOUNTER — Ambulatory Visit (HOSPITAL_COMMUNITY): Payer: Medicare HMO | Admitting: Anesthesiology

## 2023-06-15 DIAGNOSIS — Z87891 Personal history of nicotine dependence: Secondary | ICD-10-CM | POA: Insufficient documentation

## 2023-06-15 DIAGNOSIS — F32A Depression, unspecified: Secondary | ICD-10-CM | POA: Insufficient documentation

## 2023-06-15 DIAGNOSIS — K2970 Gastritis, unspecified, without bleeding: Secondary | ICD-10-CM

## 2023-06-15 DIAGNOSIS — K449 Diaphragmatic hernia without obstruction or gangrene: Secondary | ICD-10-CM | POA: Insufficient documentation

## 2023-06-15 DIAGNOSIS — R1310 Dysphagia, unspecified: Secondary | ICD-10-CM | POA: Diagnosis present

## 2023-06-15 DIAGNOSIS — F419 Anxiety disorder, unspecified: Secondary | ICD-10-CM | POA: Diagnosis not present

## 2023-06-15 DIAGNOSIS — I110 Hypertensive heart disease with heart failure: Secondary | ICD-10-CM | POA: Insufficient documentation

## 2023-06-15 DIAGNOSIS — I509 Heart failure, unspecified: Secondary | ICD-10-CM | POA: Diagnosis not present

## 2023-06-15 DIAGNOSIS — K3189 Other diseases of stomach and duodenum: Secondary | ICD-10-CM | POA: Diagnosis not present

## 2023-06-15 DIAGNOSIS — K219 Gastro-esophageal reflux disease without esophagitis: Secondary | ICD-10-CM | POA: Insufficient documentation

## 2023-06-15 DIAGNOSIS — Z79899 Other long term (current) drug therapy: Secondary | ICD-10-CM | POA: Diagnosis not present

## 2023-06-15 DIAGNOSIS — M797 Fibromyalgia: Secondary | ICD-10-CM | POA: Insufficient documentation

## 2023-06-15 DIAGNOSIS — R1311 Dysphagia, oral phase: Secondary | ICD-10-CM

## 2023-06-15 HISTORY — PX: SAVORY DILATION: SHX5439

## 2023-06-15 HISTORY — PX: ESOPHAGOGASTRODUODENOSCOPY (EGD) WITH PROPOFOL: SHX5813

## 2023-06-15 HISTORY — PX: BIOPSY: SHX5522

## 2023-06-15 SURGERY — ESOPHAGOGASTRODUODENOSCOPY (EGD) WITH PROPOFOL
Anesthesia: Monitor Anesthesia Care

## 2023-06-15 MED ORDER — PROPOFOL 500 MG/50ML IV EMUL
INTRAVENOUS | Status: AC
  Filled 2023-06-15: qty 150

## 2023-06-15 MED ORDER — PROPOFOL 1000 MG/100ML IV EMUL
INTRAVENOUS | Status: AC
  Filled 2023-06-15: qty 100

## 2023-06-15 MED ORDER — PROPOFOL 10 MG/ML IV BOLUS
INTRAVENOUS | Status: DC | PRN
  Administered 2023-06-15: 60 mg via INTRAVENOUS
  Administered 2023-06-15 (×2): 50 mg via INTRAVENOUS
  Administered 2023-06-15: 40 mg via INTRAVENOUS
  Administered 2023-06-15 (×3): 50 mg via INTRAVENOUS

## 2023-06-15 MED ORDER — SODIUM CHLORIDE 0.9 % IV SOLN: INTRAVENOUS | Status: DC

## 2023-06-15 MED ORDER — LACTATED RINGERS IV SOLN: INTRAVENOUS | Status: DC

## 2023-06-15 MED ORDER — LIDOCAINE 2% (20 MG/ML) 5 ML SYRINGE
INTRAMUSCULAR | Status: DC | PRN
  Administered 2023-06-15: 60 mg via INTRAVENOUS

## 2023-06-15 SURGICAL SUPPLY — 15 items

## 2023-06-15 NOTE — Transfer of Care (Signed)
Immediate Anesthesia Transfer of Care Note  Patient: Raven [name]  Procedure(s) Performed: ESOPHAGOGASTRODUODENOSCOPY (EGD) WITH PROPOFOL SAVORY DILATION BIOPSY  Patient Location: PACU  Anesthesia Type:MAC  Level of Consciousness: drowsy and patient cooperative  Airway & Oxygen Therapy: Patient Spontanous Breathing and Patient connected to face mask oxygen  Post-op Assessment: Report given to RN and Post -op Vital signs reviewed and stable  Post vital signs: Reviewed and stable  Last Vitals:  Vitals Value Taken Time  BP    Temp    Pulse 95 06/15/23 0934  Resp 24 06/15/23 0934  SpO2 98 % 06/15/23 0934  Vitals shown include unvalidated device data.  Last Pain:  Vitals:   06/15/23 0822  TempSrc: Temporal  PainSc: 0-No pain         Complications: No notable events documented.

## 2023-06-15 NOTE — Discharge Instructions (Signed)
YOU HAD AN ENDOSCOPIC PROCEDURE TODAY: Refer to the procedure report and other information in the discharge instructions given to you for any specific questions about what was found during the examination. If this information does not answer your questions, please call [place] office at [phone] to clarify.  ° °YOU SHOULD EXPECT: Some feelings of bloating in the abdomen. Passage of more gas than usual. Walking can help get rid of the air that was put into your GI tract during the procedure and reduce the bloating. If you had a lower endoscopy (such as a colonoscopy or flexible sigmoidoscopy) you may notice spotting of blood in your stool or on the toilet paper. Some abdominal soreness may be present for a day or two, also. ° °DIET: Your first meal following the procedure should be a light meal and then it is ok to progress to your normal diet. A half-sandwich or bowl of soup is an example of a good first meal. Heavy or fried foods are harder to digest and may make you feel nauseous or bloated. Drink plenty of fluids but you should avoid alcoholic beverages for 24 hours. If you had a esophageal dilation, please see attached instructions for diet.   ° °ACTIVITY: Your care partner should take you home directly after the procedure. You should plan to take it easy, moving slowly for the rest of the day. You can resume normal activity the day after the procedure however YOU SHOULD NOT DRIVE, use power tools, machinery or perform tasks that involve climbing or major physical exertion for 24 hours (because of the sedation medicines used during the test).  ° °SYMPTOMS TO REPORT IMMEDIATELY: °A gastroenterologist can be reached at any hour. Please call [phone]  for any of the following symptoms:  °Following lower endoscopy (colonoscopy, flexible sigmoidoscopy) °Excessive amounts of blood in the stool  °Significant tenderness, worsening of abdominal pains  °Swelling of the abdomen that is new, acute  °Fever of 100° or  higher  °Following upper endoscopy (EGD, EUS, ERCP, esophageal dilation) °Vomiting of blood or coffee ground material  °New, significant abdominal pain  °New, significant chest pain or pain under the shoulder blades  °Painful or persistently difficult swallowing  °New shortness of breath  °Black, tarry-looking or red, bloody stools ° °FOLLOW UP:  °If any biopsies were taken you will be contacted by phone or by letter within the next 1-3 weeks. Call [phone]  if you have not heard about the biopsies in 3 weeks.  °Please also call with any specific questions about appointments or follow up tests. ° °

## 2023-06-15 NOTE — Anesthesia Preprocedure Evaluation (Addendum)
Anesthesia Evaluation  Patient identified by MRN, date of birth, ID band Patient awake    Reviewed: Allergy & Precautions, NPO status , Patient's Chart, lab work & pertinent test results  History of Anesthesia Complications Negative for: history of anesthetic complications  Airway Mallampati: II  TM Distance: >3 FB Neck ROM: Full    Dental no notable dental hx. (+) Poor Dentition, Missing, Dental Advisory Given   Pulmonary asthma , former smoker   Pulmonary exam normal        Cardiovascular hypertension, Pt. on medications and Pt. on home beta blockers +CHF  Normal cardiovascular exam+ dysrhythmias   ECHO 24  1. Left ventricular ejection fraction, by estimation, is 30 to 35%. The  left ventricle has moderately decreased function. The left ventricle  demonstrates global hypokinesis. Left ventricular diastolic parameters are  indeterminate.   2. Right ventricular systolic function is normal. The right ventricular  size is normal. There is normal pulmonary artery systolic pressure. The  estimated right ventricular systolic pressure is 24.9 mmHg.   3. The mitral valve is normal in structure. Trivial mitral valve  regurgitation. No evidence of mitral stenosis.   4. The aortic valve is tricuspid. Aortic valve regurgitation is not  visualized. No aortic stenosis is present.   5. The inferior vena cava is normal in size with greater than 50%  respiratory variability, suggesting right atrial pressure of 3 mmHg.      TTE 06/2020: EF 30 to 35%, global hypokinesis, mild LVH, grade I DD    Neuro/Psych   Anxiety Depression    negative neurological ROS     GI/Hepatic Neg liver ROS, hiatal hernia,GERD  Medicated and Controlled,,Achalasia   Endo/Other  negative endocrine ROS    Renal/GU negative Renal ROS  negative genitourinary   Musculoskeletal  (+) Arthritis ,  Fibromyalgia -  Abdominal   Peds  Hematology negative  hematology ROS (+)   Anesthesia Other Findings Day of surgery medications reviewed with patient.  Reproductive/Obstetrics negative OB ROS                              Anesthesia Physical Anesthesia Plan  ASA: 4  Anesthesia Plan: MAC   Post-op Pain Management: Minimal or no pain anticipated   Induction:   PONV Risk Score and Plan: 2 and Treatment may vary due to age or medical condition and Propofol infusion  Airway Management Planned: Natural Airway and Nasal Cannula  Additional Equipment: None  Intra-op Plan:   Post-operative Plan:   Informed Consent: I have reviewed the patients History and Physical, chart, labs and discussed the procedure including the risks, benefits and alternatives for the proposed anesthesia with the patient or authorized representative who has indicated his/her understanding and acceptance.       Plan Discussed with: CRNA  Anesthesia Plan Comments: [name] is a longtime patient of cardiology initially followed by Dr. Graciela Husbands. She was first seen by Dr. Jacques Navy on 05/29/2020 for further evaluation of hypertension, palpitations and follow-up of nonischemic cardiomyopathy. She is followed for the above outlined history.    Patient was last seen in the office by me on 04/28/2023 for complaints of L>R lower extremity edema. She complained of bilateral lower extremities at rest and legs become cold to touch with tingling toes L>R.  She also reported palpitations/fluttering with associated shortness of breath.  Patient had not been taking her medications for >6 months.  She agreed to start  low-dose metoprolol and have repeat echo.  ABIs to evaluate her leg symptoms were normal bilaterally.  Echo was essentially unchanged from 2021 showing an EF of 30 to 35% with global hypokinesis.   Past medical history significant for:  Chronic systolic heart failure/nonischemic cardiomyopathy.  Cardiac MRI 03/11/2021: Severely decreased LV  function.  Normal LV size.  LVEF 34%.  Normal RV size and function.  RVEF 51%.  Mid myocardial stripe of delayed enhancement in the left ventricular septum, anterior inferior walls, nonspecific finding.  No myocardial edema.  ECV 30%, mildly increased, may be seen in hypertensive heart disease.  Findings consistent with nonischemic cardiomyopathy.  Echo 05/28/2023: EF 30 to 35%.  Global hypokinesis.  Normal RV function.  Trivial MR.  Palpitations/PVCs.  Event monitor 06/07/2020: Min HR 56 bpm, max HR 137 bpm.  NSVT 4 beat run, rate 137 bpm.  Less than 1% ventricular ectopy.  Fluttering/skipped beats associated with PVCs.  Hypertension.  GERD. )        Anesthesia Quick Evaluation

## 2023-06-15 NOTE — H&P (Signed)
[facility] History and Physical   Primary Care Physician:  Leilani Able, MD   Reason for Procedure:   dysphagia  Plan:    EGD with dilation     HPI: Raven [name] is a 42 y.o. female  here for EGD with dilation for recurrent dysphagia. See prior notes for details. Extensive workup in the past for dysphagia and globus. Dilations in the past have reliably helped her, subtle stenosis just inferior to the UES. Patient denies any bowel symptoms at this time. Otherwise feels well without any cardiopulmonary symptoms. Case done at the hospital given history of CHF.   I have discussed risks / benefits of anesthesia and endoscopic procedure with Raven [name] and they wish to proceed with the exams as outlined today.    Past Medical History:  Diagnosis Date   Abdominal pain, left lower quadrant 03/27/2014   Abnormal CXR 05/03/2019   05/03/2019   Walked RA  2 laps @  approx 271ft each @ fast pace  stopped due to  End of study with sats 95% at end     Abnormal Pap smear    Achalasia    esphog stretched several times   Acute pain of both knees 09/07/2018   Allergy    seasonal allergies   Anxiety    on meds   Arthritis    generalized-on meds   Asthma    rarely uses inhaler-PRN-stopped smoking-symptoms decreased   ASTHMA 10/09/2008   Spirometry wnl  08/08/14 with active symptoms attributed to VCD     Blood transfusion 01/2006   [facility]   Cardiomyopathy [facility])    Cardiomyopathy, idiopathic (HCC) 01/09/2014   CHF (congestive heart failure) (HCC) 06/2020   on meds   Chronic bilateral low back pain 09/07/2018   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 01/18/2018   Chronically on opiate therapy 01/18/2018   Cigarette smoker 08/09/2014   Constipation due to opioid therapy    Depression    DEPRESSION 10/09/2008   Qualifier: Diagnosis of  By: Vernie Murders  -on meds   Dysphagia 08/10/2009   Qualifier: Diagnosis of  By: Myrtie Hawk, Amy S    Dyspnea 08/08/2014    Followed in Pulmonary clinic/ [facility]/ [place]  - 08/08/2014  Walked RA x 3 laps @ 185 ft each stopped due to  End of study, no sob or desat - Spirometry 08/08/14 wnl     Dysrhythmia    Esophageal reflux 11/30/2008   Centricity Description: ESOPHAGEAL REFLUX Qualifier: Diagnosis of  By: Arlyce Dice MD, Barbette Hair  Centricity Description: GERD Qualifier: Diagnosis of  By: Myrtie Hawk, Amy S    ESOPHAGEAL STRICTURE 08/10/2009   Qualifier: Diagnosis of  By: Myrtie Hawk, Amy S    Excessive daytime sleepiness 01/18/2018   Fibromyalgia    on meds   Gallstones and inflammation of gallbladder without obstruction 01/23/2015   GERD (gastroesophageal reflux disease)    on meds - diet controlled   Globus sensation    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   Heart murmur    Heart palpitations    tx with atenolol by Jaline Pincock klein    Hiatal hernia    History of abnormal Pap smear    History of chlamydia 1990's   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   HSV-2 infection    HTN (hypertension)    on meds   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   Occipital neuralgia of right side 04/21/2017   Palpitation 04/11/2011   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   Pneumonia 05/2013   PVC's (premature ventricular contractions)    S/P laparoscopic surgery 01/23/2015   Shortness of breath dyspnea    Sleep disorder, circadian, 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   Status post dilation of esophageal narrowing    Unspecified constipation 11/30/2008   Centricity Description: UNSPECIFIED CONSTIPATION Qualifier: Diagnosis of  By: Arlyce Dice MD, Barbette Hair  Centricity Description: CONSTIPATION Qualifier: Diagnosis of  By: Myrtie Hawk, Amy S    Upper airway cough syndrome 09/05/2014   Followed in Pulmonary clinic/ [facility]/ [place]  Onset  2002 p tonsilectomy  -Spirometry wnl  08/08/14  - [facility] 09/05/2014 > inconclusive w/u by ENT / GI s f/u ov by either  since 2016 > referred  Back to Dr Delford Field 10/20/2017  - Dg Es 01/27/2019  Spontaneous gastroesophageal reflux. 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    Past Surgical History:  Procedure Laterality Date   ADENOIDECTOMY  12/2002   BLADDER SUSPENSION  06/11/2012   Procedure: TRANSVAGINAL TAPE (TVT) PROCEDURE;  Surgeon: Purcell Nails, MD;  Location: [facility];  Service: Gynecology;  Laterality: N/A;  Tension Free Vaginal Tape/cystoscopy   BREAST SURGERY  07/2001   tumor removed right breast   CHOLECYSTECTOMY N/A 01/23/2015   Procedure: LAPAROSCOPIC CHOLECYSTECTOMY ;  Surgeon: Claud Kelp, MD;  Location: [facility] OR;  Service: General;  Laterality: N/A;  case booked with cholangiogram but MD did not complete intraoperative cholangiogram    COLONOSCOPY  2020   1 polyp/TICS   CYSTOSCOPY  06/11/2012   Procedure: CYSTOSCOPY;  Surgeon: Purcell Nails, MD;  Location: [facility];  Service: Gynecology;  Laterality: N/A;   DILATION AND CURETTAGE OF UTERUS  1999   DILATION AND CURETTAGE OF UTERUS  01/2006   retained products   ESOPHAGEAL DILATION  01/30/2022   Procedure: ESOPHAGEAL DILATION;  Surgeon: Napoleon Form, MD;  Location: [facility];  Service: Endoscopy;;   ESOPHAGEAL MANOMETRY N/A 02/23/2019   Procedure: ESOPHAGEAL MANOMETRY (EM);  Surgeon: Napoleon Form, MD;  Location: [facility];  Service: Endoscopy;  Laterality: N/A;   ESOPHAGOGASTRODUODENOSCOPY     with esophageal dilation x 3   ESOPHAGOGASTRODUODENOSCOPY (EGD) WITH PROPOFOL N/A 09/25/2020   Procedure: ESOPHAGOGASTRODUODENOSCOPY (EGD) WITH PROPOFOL;  Surgeon: Benancio Deeds, MD;  Location: [facility];  Service: Gastroenterology;  Laterality: N/A;   ESOPHAGOGASTRODUODENOSCOPY  (EGD) WITH PROPOFOL N/A 01/30/2022   Procedure: ESOPHAGOGASTRODUODENOSCOPY (EGD) WITH PROPOFOL;  Surgeon: Napoleon Form, MD;  Location: [facility];  Service: Endoscopy;  Laterality: N/A;   FOREIGN BODY REMOVAL  06/11/2012   Procedure: REMOVAL FOREIGN BODY EXTREMITY;  Surgeon: Purcell Nails, MD;  Location: [facility];  Service: Gynecology;  Laterality: Right;  Removal of Implanon    LAPAROSCOPIC TUBAL LIGATION  06/11/2012   Procedure: LAPAROSCOPIC TUBAL LIGATION;  Surgeon: Purcell Nails, MD;  Location: [facility];  Service: Gynecology;  Laterality: Bilateral;  Laparoscopic Bilateral Fulgeration   POLYPECTOMY  2020   tics/1 polyp   SAVORY DILATION N/A 09/25/2020   Procedure: SAVORY DILATION;  Surgeon: Benancio Deeds, MD;  Location: [facility];  Service: Gastroenterology;  Laterality: N/A;  TONSILLECTOMY      Prior to Admission medications   Medication Sig Start Date End Date Taking? Authorizing Provider  dicyclomine (BENTYL) 10 MG capsule Take 1 capsule (10 mg total) by mouth 3 (three) times daily before meals. 01/28/22  Yes Kennedy-Smith, Malachi Carl, NP  LATUDA 60 MG TABS Take 60 mg by mouth daily.  11/08/18  Yes [provider]  linaclotide Karlene Einstein) 290 MCG CAPS capsule Take 1 capsule (290 mcg total) by mouth daily before breakfast. 01/14/22  Yes Kennedy-Smith, Malachi Carl, NP  LORazepam (ATIVAN) 2 MG tablet Take 2 mg by mouth 3 (three) times daily.   Yes [provider]  losartan (COZAAR) 25 MG tablet Take 1 tablet (25 mg total) by mouth daily. 06/09/23 09/07/23 Yes Wittenborn, [name], NP  metoprolol tartrate (LOPRESSOR) 25 MG tablet Take 0.5 tablets (12.5 mg total) by mouth 2 (two) times daily. 04/30/23 07/29/23 Yes Parke Poisson, MD  oxyCODONE-acetaminophen (PERCOCET) 10-325 MG tablet Take 1 tablet by mouth 4 (four) times daily. 03/03/23  Yes   pantoprazole sodium (PROTONIX) 40 mg Take 40 mg by mouth daily. 01/14/22  Yes Arnaldo Natal, NP  triamcinolone cream  (KENALOG) 0.1 % Apply 1 Application topically 2 (two) times daily. 01/01/22  Yes [provider]    No current facility-administered medications for this encounter.    Allergies as of 04/23/2023 - Review Complete 01/30/2022  Allergen Reaction Noted   Dust mite extract Shortness Of Breath 01/28/2015   Other Shortness Of Breath 01/19/2015   Pollen extract Shortness Of Breath 01/28/2015    Family History  Problem Relation Age of Onset   Heart disease Father    Hypertension Father    Colon polyps Father 58   Stroke Mother    Hypertension Mother    Heart attack Mother    Breast cancer Mother    Neuropathy Mother    Arthritis Mother    Asthma Mother    Peripheral Artery Disease Mother 45       had her leg amputated 2020   Neuropathy Sister    Arthritis Sister    Asthma Sister    Stomach cancer Paternal Uncle 66   Colon cancer Paternal Uncle 16   Colon polyps Paternal Uncle 32   Esophageal cancer Neg Hx    Rectal cancer Neg Hx     Social History   Socioeconomic History   Marital status: Single    Spouse name: Not on file   Number of children: 2   Years of education: College   Highest education level: Not on file  Occupational History   Not on file  Tobacco Use   Smoking status: Former    Packs/day: 0.25    Years: 15.00    Additional pack years: 0.00    Total pack years: 3.75    Types: Cigarettes    Quit date: 04/29/2019    Years since quitting: 4.1   Smokeless tobacco: Never   Tobacco comments:    stopped smoking 04-29-2019  Vaping Use   Vaping Use: Never used  Substance and Sexual Activity   Alcohol use: No    Comment: special occassions   Drug use: No   Sexual activity: Yes    Birth control/protection: Surgical  Other Topics Concern   Not on file  Social History Narrative   Lives with fiance and children   Caffeine use:  Hot ginger tea- daily   Left-handed   Social Determinants of Health   Financial Resource Strain: Not  on file  Food  Insecurity: Not on file  Transportation Needs: Not on file  Physical Activity: Not on file  Stress: Not on file  Social Connections: Not on file  Intimate Partner Violence: Not on file    Review of Systems: All other review of systems negative except as mentioned in the HPI.  Physical Exam: Vital signs Ht 5\' 9"  (1.753 m)   Wt 87.1 kg   LMP 05/30/2023   BMI 28.35 kg/m   General:   Alert,  Well-developed, pleasant and cooperative in NAD Lungs:  Clear throughout to auscultation.   Heart:  Regular rate and rhythm Abdomen:  Soft, nontender and nondistended.   Neuro/Psych:  Alert and cooperative. Normal mood and affect. A and O x 3  Harlin Rain, MD [facility]

## 2023-06-15 NOTE — Anesthesia Postprocedure Evaluation (Signed)
Anesthesia Post Note  Patient: MAKYNLEE QUINDE  Procedure(s) Performed: ESOPHAGOGASTRODUODENOSCOPY (EGD) WITH PROPOFOL SAVORY DILATION BIOPSY     Patient location during evaluation: PACU Anesthesia Type: MAC Level of consciousness: awake and alert Pain management: pain level controlled Vital Signs Assessment: post-procedure vital signs reviewed and stable Respiratory status: spontaneous breathing, nonlabored ventilation, respiratory function stable and patient connected to nasal cannula oxygen Cardiovascular status: stable and blood pressure returned to baseline Postop Assessment: no apparent nausea or vomiting Anesthetic complications: no   No notable events documented.  Last Vitals:  Vitals:   06/15/23 0822  BP: (!) 140/102  Pulse: 80  Resp: 16  Temp: 36.6 C  SpO2: 99%    Last Pain:  Vitals:   06/15/23 0822  TempSrc: Temporal  PainSc: 0-No pain                 Urho Rio

## 2023-06-15 NOTE — Anesthesia Procedure Notes (Signed)
Procedure Name: MAC Date/Time: 06/15/2023 9:20 AM  Performed by: Adria Dill, CRNAPre-anesthesia Checklist: Patient identified, Emergency Drugs available, Suction available and Patient being monitored Patient Re-evaluated:Patient Re-evaluated prior to induction Oxygen Delivery Method: Simple face mask Preoxygenation: Pre-oxygenation with 100% oxygen Induction Type: IV induction Airway Equipment and Method: Bite block Placement Confirmation: positive ETCO2 and breath sounds checked- equal and bilateral Dental Injury: Teeth and Oropharynx as per pre-operative assessment

## 2023-06-15 NOTE — Op Note (Signed)
[facility] Patient Name: Raven [name] Procedure Date: 06/15/2023 MRN: [id number] Attending MD: Willaim Rayas. Adela Lank , MD, [phone] Date of Birth: 1981-09-21 CSN: [id number] Age: 42 Admit Type: Outpatient Procedure:                Upper GI endoscopy Indications:              Dysphagia - recurrent dysphagia, extensive prior                            workup, has reliably responded to dilation with                            subtle stenosis noted just inferior to UES Providers:                Willaim Rayas. Adela Lank, MD, Marge Duncans, RN, Beryle Beams, Technician Referring MD:              Medicines:                Monitored Anesthesia Care Complications:            No immediate complications. Estimated blood loss:                            Minimal. Estimated Blood Loss:     Estimated blood loss was minimal. Procedure:                Pre-Anesthesia Assessment:                           - Prior to the procedure, a History and Physical                            was performed, and patient medications and                            allergies were reviewed. The patient's tolerance of                            previous anesthesia was also reviewed. The risks                            and benefits of the procedure and the sedation                            options and risks were discussed with the patient.                            All questions were answered, and informed consent                            was obtained. Prior Anticoagulants: The patient has  taken no anticoagulant or antiplatelet agents. ASA                            Grade Assessment: IV - A patient with severe                            systemic disease that is a constant threat to life.                            After reviewing the risks and benefits, the patient                            was deemed in satisfactory condition to undergo the                             procedure.                           After obtaining informed consent, the endoscope was                            passed under direct vision. Throughout the                            procedure, the patient's blood pressure, pulse, and                            oxygen saturations were monitored continuously. The                            [id number]-H190 ([id number]) Olympus endoscope was introduced                            through the mouth, and advanced to the second part                            of duodenum. The upper GI endoscopy was                            accomplished without difficulty. The patient                            tolerated the procedure well. Scope In: Scope Out: Findings:      A small hiatal hernia was present.      The Z-line was regular.      The exam of the esophagus was otherwise normal.      A guidewire was placed and the scope was withdrawn. Dilation was       performed in the entire esophagus with a Savary dilator with mild       resistance at 17 mm and 18 mm. [place] endoscopy showed appropriate       dilation effect / 2 mucosal wrents just inferior to the UES at site of       suspected subtle stricture.      Patchy mildly  erythematous mucosa was found in the gastric antrum.      The exam of the stomach was otherwise normal.      Biopsies were taken with a cold forceps for Helicobacter pylori testing.      The duodenal bulb and second portion of the duodenum were normal. Impression:               - Small hiatal hernia.                           - Z-line regular.                           - Normal esophagus otherwise - dilated to 18mm with                            good dilation effect at site of suspected subtle                            stricture just inferior to the UES.                           - Erythematous mucosa in the antrum.                           - Normal stomach otherwise - biopsies taken to rule                             out H pylori                           - Normal duodenal bulb and second portion of the                            duodenum. Moderate Sedation:      No moderate sedation, case performed with MAC Recommendation:           - Patient has a contact number available for                            emergencies. The signs and symptoms of potential                            delayed complications were discussed with the                            patient. Return to normal activities tomorrow.                            Written discharge instructions were provided to the                            patient.                           - Post dilation diet                           -  Continue present medications.                           - Await pathology results.                           - Follow up as needed for repeat dilation for                            recurrent symptoms, if dilation provides benefit Procedure Code(s):        --- Professional ---                           [phone], Esophagogastroduodenoscopy, flexible,                            transoral; with insertion of guide wire followed by                            passage of dilator(s) through esophagus over guide                            wire                           [id number], 59, Esophagogastroduodenoscopy, flexible,                            transoral; with biopsy, single or multiple Diagnosis Code(s):        --- Professional ---                           K44.9, Diaphragmatic hernia without obstruction or                            gangrene                           K31.89, Other diseases of stomach and duodenum                           R13.10, Dysphagia, unspecified CPT copyright 2022 American Medical Association. All rights reserved. The codes documented in this report are preliminary and upon coder review may  be revised to meet current compliance requirements. Viviann Spare P. Orlyn Odonoghue, MD 06/15/2023 9:39:03 AM This report  has been signed electronically. Number of Addenda: 0

## 2023-06-16 LAB — SURGICAL PATHOLOGY

## 2023-06-18 ENCOUNTER — Encounter (HOSPITAL_COMMUNITY): Payer: Self-pay | Admitting: Gastroenterology

## 2023-09-28 ENCOUNTER — Encounter (HOSPITAL_COMMUNITY): Payer: Medicare HMO | Admitting: Cardiology

## 2023-09-29 ENCOUNTER — Ambulatory Visit
Admission: RE | Admit: 2023-09-29 | Discharge: 2023-09-29 | Disposition: A | Discharge status: Home / Self Care | Payer: Medicare HMO | Source: Ambulatory Visit | Attending: Family Medicine | Admitting: Family Medicine

## 2023-09-29 ENCOUNTER — Other Ambulatory Visit: Payer: Self-pay | Admitting: Family Medicine

## 2023-09-29 DIAGNOSIS — M542 Cervicalgia: Secondary | ICD-10-CM

## 2023-11-30 ENCOUNTER — Other Ambulatory Visit (HOSPITAL_COMMUNITY): Payer: Self-pay

## 2023-11-30 MED ORDER — OXYCODONE-ACETAMINOPHEN 10-325 MG PO TABS
1.0000 | ORAL_TABLET | Freq: Four times a day (QID) | ORAL | 0 refills | Status: DC
  Filled 2023-11-30: qty 120, 30d supply, fill #0

## 2023-12-17 ENCOUNTER — Other Ambulatory Visit (HOSPITAL_COMMUNITY): Payer: Self-pay

## 2023-12-17 MED ORDER — OXYCODONE-ACETAMINOPHEN 10-325 MG PO TABS
1.0000 | ORAL_TABLET | Freq: Four times a day (QID) | ORAL | 0 refills | Status: DC
  Filled 2023-12-25: qty 120, 30d supply, fill #0

## 2023-12-25 ENCOUNTER — Other Ambulatory Visit (HOSPITAL_COMMUNITY): Payer: Self-pay

## 2023-12-28 ENCOUNTER — Other Ambulatory Visit: Payer: Self-pay

## 2023-12-28 ENCOUNTER — Other Ambulatory Visit (HOSPITAL_COMMUNITY): Payer: Self-pay

## 2024-01-21 ENCOUNTER — Other Ambulatory Visit (HOSPITAL_COMMUNITY): Payer: Self-pay

## 2024-01-21 MED ORDER — OXYCODONE-ACETAMINOPHEN 10-325 MG PO TABS
1.0000 | ORAL_TABLET | Freq: Four times a day (QID) | ORAL | 0 refills | Status: DC
  Filled 2024-01-21: qty 120, 30d supply, fill #0

## 2024-02-05 ENCOUNTER — Telehealth (HOSPITAL_COMMUNITY): Payer: Self-pay | Admitting: Cardiology

## 2024-02-05 NOTE — Telephone Encounter (Signed)
 Called patient at [phone] to remind patient of her appointment with Dr. Zenaida on Monday 02/08/24 at 11:40 AM with Dr. Zenaida.   Front office was unable to reach patient directly - - left a detailed voice message of her appointment date, time, and [facility] clinic office number if patient has any questions or concerns.

## 2024-02-08 ENCOUNTER — Encounter (HOSPITAL_COMMUNITY): Payer: Medicare HMO | Admitting: Cardiology

## 2024-04-07 ENCOUNTER — Encounter (HOSPITAL_COMMUNITY): Payer: Self-pay

## 2024-04-07 ENCOUNTER — Emergency Department (EMERGENCY_DEPARTMENT_HOSPITAL): Admitting: Anesthesiology

## 2024-04-07 ENCOUNTER — Emergency Department (HOSPITAL_COMMUNITY)
Admission: EM | Admit: 2024-04-07 | Discharge: 2024-04-08 | Disposition: A | Discharge status: Home / Self Care | Attending: Emergency Medicine | Admitting: Emergency Medicine

## 2024-04-07 ENCOUNTER — Other Ambulatory Visit: Payer: Self-pay

## 2024-04-07 ENCOUNTER — Emergency Department (HOSPITAL_COMMUNITY): Admitting: Anesthesiology

## 2024-04-07 ENCOUNTER — Emergency Department (HOSPITAL_COMMUNITY)

## 2024-04-07 ENCOUNTER — Encounter (HOSPITAL_COMMUNITY)
Admission: EM | Disposition: A | Discharge status: Home / Self Care | Payer: Self-pay | Source: Home / Self Care | Attending: Emergency Medicine

## 2024-04-07 DIAGNOSIS — D72829 Elevated white blood cell count, unspecified: Secondary | ICD-10-CM | POA: Insufficient documentation

## 2024-04-07 DIAGNOSIS — T18128A Food in esophagus causing other injury, initial encounter: Secondary | ICD-10-CM

## 2024-04-07 DIAGNOSIS — I110 Hypertensive heart disease with heart failure: Secondary | ICD-10-CM | POA: Diagnosis not present

## 2024-04-07 DIAGNOSIS — R062 Wheezing: Secondary | ICD-10-CM | POA: Insufficient documentation

## 2024-04-07 DIAGNOSIS — K2289 Other specified disease of esophagus: Secondary | ICD-10-CM

## 2024-04-07 DIAGNOSIS — R1310 Dysphagia, unspecified: Secondary | ICD-10-CM | POA: Insufficient documentation

## 2024-04-07 DIAGNOSIS — I509 Heart failure, unspecified: Secondary | ICD-10-CM | POA: Diagnosis not present

## 2024-04-07 DIAGNOSIS — R0682 Tachypnea, not elsewhere classified: Secondary | ICD-10-CM | POA: Diagnosis not present

## 2024-04-07 DIAGNOSIS — W44F3XA Food entering into or through a natural orifice, initial encounter: Secondary | ICD-10-CM

## 2024-04-07 DIAGNOSIS — T182XXA Foreign body in stomach, initial encounter: Secondary | ICD-10-CM | POA: Diagnosis not present

## 2024-04-07 HISTORY — PX: ESOPHAGOGASTRODUODENOSCOPY: SHX5428

## 2024-04-07 LAB — CBC
HCT: 36.8 % (ref 36.0–46.0)
Hemoglobin: 12.7 g/dL (ref 12.0–15.0)
MCH: 31.4 pg (ref 26.0–34.0)
MCHC: 34.5 g/dL (ref 30.0–36.0)
MCV: 90.9 fL (ref 80.0–100.0)
Platelets: 207 10*3/uL (ref 150–400)
RBC: 4.05 MIL/uL (ref 3.87–5.11)
RDW: 12.6 % (ref 11.5–15.5)
WBC: 11.1 10*3/uL — ABNORMAL HIGH (ref 4.0–10.5)
nRBC: 0 % (ref 0.0–0.2)

## 2024-04-07 LAB — BASIC METABOLIC PANEL WITH GFR
Anion gap: 5 (ref 5–15)
BUN: 10 mg/dL (ref 6–20)
CO2: 23 mmol/L (ref 22–32)
Calcium: 8.6 mg/dL — ABNORMAL LOW (ref 8.9–10.3)
Chloride: 111 mmol/L (ref 98–111)
Creatinine, Ser: 0.81 mg/dL (ref 0.44–1.00)
GFR, Estimated: >60 mL/min (ref >60)
Glucose, Bld: 134 mg/dL — ABNORMAL HIGH (ref 70–99)
Potassium: 3.7 mmol/L (ref 3.5–5.1)
Sodium: 139 mmol/L (ref 135–145)

## 2024-04-07 SURGERY — EGD (ESOPHAGOGASTRODUODENOSCOPY)
Anesthesia: General

## 2024-04-07 MED ORDER — ONDANSETRON HCL 4 MG/2ML IJ SOLN
INTRAMUSCULAR | Status: AC
  Administered 2024-04-07: 4 mg via INTRAVENOUS
  Filled 2024-04-07: qty 2

## 2024-04-07 MED ORDER — ALBUTEROL SULFATE HFA 108 (90 BASE) MCG/ACT IN AERS
1.0000 | INHALATION_SPRAY | Freq: Once | RESPIRATORY_TRACT | Status: AC
  Administered 2024-04-07: 1 via RESPIRATORY_TRACT
  Filled 2024-04-07: qty 6.7

## 2024-04-07 MED ORDER — MIDAZOLAM HCL 2 MG/2ML IJ SOLN
INTRAMUSCULAR | Status: DC | PRN
  Administered 2024-04-07: 2 mg via INTRAVENOUS

## 2024-04-07 MED ORDER — PROPOFOL 10 MG/ML IV BOLUS
INTRAVENOUS | Status: DC | PRN
  Administered 2024-04-07: 150 mg via INTRAVENOUS

## 2024-04-07 MED ORDER — LIDOCAINE 2% (20 MG/ML) 5 ML SYRINGE
INTRAMUSCULAR | Status: DC | PRN
  Administered 2024-04-07: 60 mg via INTRAVENOUS

## 2024-04-07 MED ORDER — ONDANSETRON HCL 4 MG/2ML IJ SOLN: 4.0000 mg | Freq: Once | INTRAMUSCULAR | Status: AC

## 2024-04-07 MED ORDER — SUCCINYLCHOLINE CHLORIDE 200 MG/10ML IV SOSY
PREFILLED_SYRINGE | INTRAVENOUS | Status: DC | PRN
  Administered 2024-04-07: 100 mg via INTRAVENOUS

## 2024-04-07 MED ORDER — NITROGLYCERIN 0.4 MG SL SUBL
0.4000 mg | SUBLINGUAL_TABLET | Freq: Once | SUBLINGUAL | Status: AC
  Administered 2024-04-07: 0.4 mg via SUBLINGUAL
  Filled 2024-04-07: qty 1

## 2024-04-07 MED ORDER — PROPOFOL 10 MG/ML IV BOLUS
INTRAVENOUS | Status: AC
  Filled 2024-04-07: qty 20

## 2024-04-07 MED ORDER — MORPHINE SULFATE (PF) 4 MG/ML IV SOLN
4.0000 mg | Freq: Once | INTRAVENOUS | Status: AC
  Administered 2024-04-07: 4 mg via INTRAVENOUS
  Filled 2024-04-07: qty 1

## 2024-04-07 MED ORDER — DEXAMETHASONE SODIUM PHOSPHATE 10 MG/ML IJ SOLN
INTRAMUSCULAR | Status: DC | PRN
  Administered 2024-04-07: 10 mg via INTRAVENOUS

## 2024-04-07 MED ORDER — MIDAZOLAM HCL 2 MG/2ML IJ SOLN
INTRAMUSCULAR | Status: AC
  Filled 2024-04-07: qty 2

## 2024-04-07 MED ORDER — ONDANSETRON HCL 4 MG/2ML IJ SOLN
INTRAMUSCULAR | Status: DC | PRN
  Administered 2024-04-07: 4 mg via INTRAVENOUS

## 2024-04-07 MED ORDER — GLUCAGON HCL RDNA (DIAGNOSTIC) 1 MG IJ SOLR
1.0000 mg | Freq: Once | INTRAMUSCULAR | Status: AC
  Administered 2024-04-07: 1 mg via INTRAMUSCULAR
  Filled 2024-04-07: qty 1

## 2024-04-07 MED ORDER — SODIUM CHLORIDE 0.9 % IV SOLN: INTRAVENOUS | Status: DC | PRN

## 2024-04-07 MED ORDER — FENTANYL CITRATE (PF) 250 MCG/5ML IJ SOLN
INTRAMUSCULAR | Status: DC | PRN
  Administered 2024-04-07: 50 ug via INTRAVENOUS

## 2024-04-07 MED ORDER — FENTANYL CITRATE (PF) 100 MCG/2ML IJ SOLN
INTRAMUSCULAR | Status: AC
  Filled 2024-04-07: qty 2

## 2024-04-07 NOTE — ED Triage Notes (Signed)
 Pt states that she was eating dinner and felt like she had something stuck in her throat. Pt states that she has had issues with her throat for a while now. Pt states that she is having difficulty breathing.

## 2024-04-07 NOTE — Op Note (Signed)
 [facility] Patient Name: Raven [name] Procedure Date: 04/07/2024 MRN: [id number] Attending MD: Lynann Bologna , MD, [id number] Date of Birth: 08-29-1981 CSN: [id number] Age: 43 Admit Type: Emergency Department Procedure:                Upper GI endoscopy Indications:              Acute on chronic dysphagia. Food impaction Providers:                Lynann Bologna, MD, Rogue Jury, RN, Salley Scarlet, Technician, Dairl Ponder, CRNA Referring MD:              Medicines:                General Anesthesia Complications:            No immediate complications. Estimated Blood Loss:     Estimated blood loss: none. Procedure:                Pre-Anesthesia Assessment:                           - Prior to the procedure, a History and Physical                            was performed, and patient medications and                            allergies were reviewed. The patient's tolerance of                            previous anesthesia was also reviewed. The risks                            and benefits of the procedure and the sedation                            options and risks were discussed with the patient.                            All questions were answered, and informed consent                            was obtained. Prior Anticoagulants: The patient has                            taken no anticoagulant or antiplatelet agents. ASA                            Grade Assessment: E - Emergency. After reviewing                            the risks and benefits, the patient was deemed in  satisfactory condition to undergo the procedure.                           After obtaining informed consent, the endoscope was                            passed under direct vision. Throughout the                            procedure, the patient's blood pressure, pulse, and                            oxygen saturations were monitored  continuously. The                            [id number]-[id number] ([id number] Olympus endoscope was introduced                            through the mouth, and advanced to the second part                            of duodenum. The upper GI endoscopy was                            accomplished without difficulty. The patient                            tolerated the procedure well. Scope In: Scope Out: Findings:      The examined esophagus was normal except for mild erythema in the       proximal esophagus. She had spontaneous food disimpaction. No definite       stricture noted. It did distend well.      The entire examined stomach was normal. Some food was noted.      The examined duodenum was normal. Impression:               - Mild erythema in the proximal esophagus s/o                            recent food impaction s/p spontaneous food                            disimpaction. No definite stricture noted.                           - Some food in the stomach.                           - No specimens collected. Moderate Sedation:      Not Applicable - Patient had care per Anesthesia. Recommendation:           - Patient has a contact number available for                            emergencies. The signs and symptoms of potential  delayed complications were discussed with the                            patient. Return to normal activities tomorrow.                            Written discharge instructions were provided to the                            patient.                           - Soft diet today, then regular in AM. She needs to                            chew foods especially meats and breads well and eat                            slowly.                           - Continue present medications including Protonix                            40 mg p.o. daily.                           - Recommend follow-up with [facility] as outpatient. I                             do recommend barium swallow followed by repeat EGD                            with dilatation if needed. I will get in touch with                            Dr. Adela Lank                           - The findings and recommendations were discussed                            with the patient's family. Procedure Code(s):        --- Professional ---                           [phone], Esophagogastroduodenoscopy, flexible,                            transoral; diagnostic, including collection of                            specimen(s) by brushing or washing, when performed                            (  separate procedure) Diagnosis Code(s):        --- Professional ---                           R13.10, Dysphagia, unspecified CPT copyright 2022 [employer]. All rights reserved. The codes documented in this report are preliminary and upon coder review may  be revised to meet current compliance requirements. Lynann Bologna, MD 04/07/2024 10:02:46 PM This report has been signed electronically. Number of Addenda: 0

## 2024-04-07 NOTE — Anesthesia Procedure Notes (Signed)
 Procedure Name: Intubation Date/Time: 04/07/2024 9:48 PM  Performed by: Dairl Ponder, CRNAPre-anesthesia Checklist: Patient identified, Emergency Drugs available, Suction available and Patient being monitored Patient Re-evaluated:Patient Re-evaluated prior to induction Oxygen Delivery Method: Circle System Utilized Preoxygenation: Pre-oxygenation with 100% oxygen Induction Type: IV induction Ventilation: Mask ventilation without difficulty Laryngoscope Size: Mac and 3 Grade View: Grade I Tube type: Oral Tube size: 7.0 mm Number of attempts: 1 Airway Equipment and Method: Stylet and Oral airway Placement Confirmation: ETT inserted through vocal cords under direct vision, positive ETCO2 and breath sounds checked- equal and bilateral Secured at: 22 cm Tube secured with: Tape Dental Injury: Teeth and Oropharynx as per pre-operative assessment

## 2024-04-07 NOTE — Transfer of Care (Signed)
 Immediate Anesthesia Transfer of Care Note  Patient: Raven [name]  Procedure(s) Performed: EGD (ESOPHAGOGASTRODUODENOSCOPY)  Patient Location: Endoscopy Unit  Anesthesia Type:General  Level of Consciousness: drowsy and patient cooperative  Airway & Oxygen Therapy: Patient Spontanous Breathing  Post-op Assessment: Report given to RN and Post -op Vital signs reviewed and stable  Post vital signs: Reviewed and stable  Last Vitals:  Vitals Value Taken Time  BP    Temp    Pulse    Resp    SpO2      Last Pain:  Vitals:   04/07/24 2102  TempSrc:   PainSc: 9          Complications: No notable events documented.

## 2024-04-07 NOTE — Anesthesia Preprocedure Evaluation (Signed)
 Anesthesia Evaluation  Patient identified by MRN, date of birth, ID band Patient awake    Reviewed: Allergy & Precautions, NPO status , Patient's Chart, lab work & pertinent test results  History of Anesthesia Complications Negative for: history of anesthetic complications  Airway Mallampati: II  TM Distance: >3 FB Neck ROM: Full    Dental no notable dental hx. (+) Poor Dentition, Missing, Dental Advisory Given   Pulmonary asthma , former smoker   Pulmonary exam normal        Cardiovascular hypertension, Pt. on medications +CHF  Normal cardiovascular exam+ dysrhythmias   ECHO 24  1. Left ventricular ejection fraction, by estimation, is 30 to 35%. The  left ventricle has moderately decreased function. The left ventricle  demonstrates global hypokinesis. Left ventricular diastolic parameters are  indeterminate.   2. Right ventricular systolic function is normal. The right ventricular  size is normal. There is normal pulmonary artery systolic pressure. The  estimated right ventricular systolic pressure is 24.9 mmHg.   3. The mitral valve is normal in structure. Trivial mitral valve  regurgitation. No evidence of mitral stenosis.   4. The aortic valve is tricuspid. Aortic valve regurgitation is not  visualized. No aortic stenosis is present.   5. The inferior vena cava is normal in size with greater than 50%  respiratory variability, suggesting right atrial pressure of 3 mmHg.      TTE 06/2020: EF 30 to 35%, global hypokinesis, mild LVH, grade I DD    Neuro/Psych   Anxiety Depression    negative neurological ROS     GI/Hepatic Neg liver ROS, hiatal hernia,GERD  Medicated and Controlled,,Food impaction Achalasia   Endo/Other  negative endocrine ROS    Renal/GU negative Renal ROS  negative genitourinary   Musculoskeletal  (+) Arthritis ,  Fibromyalgia -  Abdominal   Peds  Hematology negative hematology ROS (+)    Anesthesia Other Findings   Reproductive/Obstetrics negative OB ROS                             Anesthesia Physical Anesthesia Plan  ASA: 4  Anesthesia Plan: General   Post-op Pain Management: Minimal or no pain anticipated   Induction: Intravenous and Rapid sequence  PONV Risk Score and Plan: 2 and Treatment may vary due to age or medical condition, Dexamethasone and Ondansetron  Airway Management Planned: Oral ETT  Additional Equipment:   Intra-op Plan:   Post-operative Plan: Extubation in OR  Informed Consent: I have reviewed the patients History and Physical, chart, labs and discussed the procedure including the risks, benefits and alternatives for the proposed anesthesia with the patient or authorized representative who has indicated his/her understanding and acceptance.     Dental advisory given  Plan Discussed with: CRNA  Anesthesia Plan Comments: Raven [name] is a longtime patient of cardiology initially followed by Dr. Graciela Husbands. She was first seen by Dr. Jacques Navy on 05/29/2020 for further evaluation of hypertension, palpitations and follow-up of nonischemic cardiomyopathy. She is followed for the above outlined history.    Patient was last seen in the office by me on 04/28/2023 for complaints of L>R lower extremity edema. She complained of bilateral lower extremities at rest and legs become cold to touch with tingling toes L>R.  She also reported palpitations/fluttering with associated shortness of breath.  Patient had not been taking her medications for >6 months.  She agreed to start low-dose metoprolol and have repeat echo.  ABIs to evaluate her leg symptoms were normal bilaterally.  Echo was essentially unchanged from 2021 showing an EF of 30 to 35% with global hypokinesis.   Past medical history significant for:  Chronic systolic heart failure/nonischemic cardiomyopathy.  Cardiac MRI 03/11/2021: Severely decreased LV function.  Normal LV  size.  LVEF 34%.  Normal RV size and function.  RVEF 51%.  Mid myocardial stripe of delayed enhancement in the left ventricular septum, anterior inferior walls, nonspecific finding.  No myocardial edema.  ECV 30%, mildly increased, may be seen in hypertensive heart disease.  Findings consistent with nonischemic cardiomyopathy.  Echo 05/28/2023: EF 30 to 35%.  Global hypokinesis.  Normal RV function.  Trivial MR.  Palpitations/PVCs.  Event monitor 06/07/2020: Min HR 56 bpm, max HR 137 bpm.  NSVT 4 beat run, rate 137 bpm.  Less than 1% ventricular ectopy.  Fluttering/skipped beats associated with PVCs.  Hypertension.  GERD. )        Anesthesia Quick Evaluation

## 2024-04-07 NOTE — ED Provider Notes (Signed)
 [place] EMERGENCY DEPARTMENT AT [facility] Provider Note   CSN: [id number] Arrival date & time: 04/07/24  1818     History  Chief Complaint  Patient presents with   Dysphagia    Raven [name] is a 43 y.o. female with past medical history significant for achalasia, esophageal stricture, dysphagia, frequent esophageal dilations who presents with concern for after eating dinner last night she felt like there was something partially stuck in her throat, she reports that she has had issues with her throat intermittently for a while.  She endorses some difficulty breathing, cough, wheezing,  HPI     Home Medications Prior to Admission medications   Medication Sig Start Date End Date Taking? Authorizing Provider  dicyclomine (BENTYL) 10 MG capsule Take 1 capsule (10 mg total) by mouth 3 (three) times daily before meals. 01/28/22   Kennedy-Smith, Malachi Carl, NP  LATUDA 60 MG TABS Take 60 mg by mouth daily.  11/08/18   [provider]  linaclotide Karlene Einstein) 290 MCG CAPS capsule Take 1 capsule (290 mcg total) by mouth daily before breakfast. 01/14/22   Arnaldo Natal, NP  LORazepam (ATIVAN) 2 MG tablet Take 2 mg by mouth 3 (three) times daily.    [provider]  losartan (COZAAR) 25 MG tablet Take 1 tablet (25 mg total) by mouth daily. 06/09/23 09/07/23  Carlos Levering, NP  metoprolol tartrate (LOPRESSOR) 25 MG tablet Take 0.5 tablets (12.5 mg total) by mouth 2 (two) times daily. 04/30/23 07/29/23  Parke Poisson, MD  oxyCODONE-acetaminophen (PERCOCET) 10-325 MG tablet Take 1 tablet by mouth 4 (four) times daily. 03/03/23     pantoprazole sodium (PROTONIX) 40 mg Take 40 mg by mouth daily. 01/14/22   Arnaldo Natal, NP  triamcinolone cream (KENALOG) 0.1 % Apply 1 Application topically 2 (two) times daily. 01/01/22   [provider]      Allergies    Codeine, Dust mite extract, Other, Pollen extract, and Promethazine    Review of  Systems   Review of Systems  All other systems reviewed and are negative.   Physical Exam Updated Vital Signs BP (!) 143/86   Pulse 95   Temp 98.2 F (36.8 C) (Axillary)   Resp (!) 22   Wt 87 kg   SpO2 100%   BMI 28.32 kg/m  Physical Exam Vitals and nursing note reviewed.  Constitutional:      General: She is not in acute distress.    Appearance: Normal appearance.  HENT:     Head: Normocephalic and atraumatic.  Eyes:     General:        Right eye: No discharge.        Left eye: No discharge.  Cardiovascular:     Rate and Rhythm: Normal rate and regular rhythm.     Heart sounds: No murmur heard.    No friction rub. No gallop.  Pulmonary:     Effort: Pulmonary effort is normal.     Breath sounds: Normal breath sounds.     Comments: Coarse rhonchi, wheezing throughout left-sided lung fields. Abdominal:     General: Bowel sounds are normal.     Palpations: Abdomen is soft.  Skin:    General: Skin is warm and dry.     Capillary Refill: Capillary refill takes less than 2 seconds.  Neurological:     Mental Status: She is alert and oriented to person, place, and time.  Psychiatric:  Mood and Affect: Mood normal.        Behavior: Behavior normal.     ED Results / Procedures / Treatments   Labs (all labs ordered are listed, but only abnormal results are displayed) Labs Reviewed  CBC - Abnormal; Notable for the following components:      Result Value   WBC 11.1 (*)    All other components within normal limits  BASIC METABOLIC PANEL WITH GFR - Abnormal; Notable for the following components:   Glucose, Bld 134 (*)    Calcium 8.6 (*)    All other components within normal limits    EKG EKG Interpretation Date/Time:  Thursday April 07 2024 18:56:08 EDT Ventricular Rate:  95 PR Interval:  162 QRS Duration:  103 QT Interval:  375 QTC Calculation: 472 R Axis:   50  Text Interpretation: Sinus rhythm Ventricular premature complex Low voltage, extremity leads  No significant change since last tracing Confirmed by Vanetta Mulders [phone]) on 04/07/2024 6:59:35 PM  Radiology No results found.  Procedures Procedures    Medications Ordered in ED Medications  nitroGLYCERIN (NITROSTAT) SL tablet 0.4 mg (0.4 mg Sublingual Given 04/07/24 1904)  glucagon (human recombinant) (GLUCAGEN) injection 1 mg (1 mg Intramuscular Given 04/07/24 1904)  albuterol (VENTOLIN HFA) 108 (90 Base) MCG/ACT inhaler 1 puff (1 puff Inhalation Given 04/07/24 1905)  morphine (PF) 4 MG/ML injection 4 mg (4 mg Intravenous Given 04/07/24 2102)  ondansetron (ZOFRAN) injection 4 mg (4 mg Intravenous Given 04/07/24 2103)    ED Course/ Medical Decision Making/ A&P Clinical Course as of 04/07/24 2152  Thu Apr 07, 2024  2032 gupta [CP]    Clinical Course User Index [CP] Olene Floss, PA-C                                 Medical Decision Making Amount and/or Complexity of Data Reviewed Labs: ordered. Radiology: ordered.  Risk Prescription drug management.   This patient is a 43 y.o. female  who presents to the ED for concern of dysphagia, possible aspiration, possible food bolus.   Differential diagnoses prior to evaluation: The emergent differential diagnosis includes, but is not limited to, achalasia, esophageal stricture, aspiration pneumonia, asthma or COPD exacerbation, other complication of esophageal stricture including acute choking, other respiratory abnormality. This is not an exhaustive differential.   Past Medical History / Co-morbidities / Social History: achalasia, esophageal stricture, dysphagia, frequent esophageal dilations  Additional history: Chart reviewed. Pertinent results include: Reviewed extensive previous GI evaluations, esophageal dilations after multiple episodes of previous food bolus impaction  Physical Exam: Physical exam performed. The pertinent findings include: She has some wheezing, rhonchi in the upper lung fields, some mild  tachypnea, she has no stridor, no dysphonia.  She is tolerating oral secretions at this time.  Lab Tests/Imaging studies: I personally interpreted labs/imaging and the pertinent results include: CBC notable for mild leukocytosis, blood cells 11.1, BMP overall unremarkable, I independently interpreted plain home chest x-ray which shows no evidence of acute intrathoracic abnormality, although clinically with some concern for possible developing aspiration pneumonia based on her history and exam. Radiology read pending at time of handoff.  Cardiac monitoring: EKG obtained and interpreted by myself and attending physician which shows: Normal sinus rhythm, no significant change from last tracing   Medications: I ordered medication including glucagon, nitroglycerin, morphine for pain, and attempting to clear her food bolus, she reports no relief so we reached  out to GI, spoke with Dr. Chales Abrahams who will reach out to the GI team in endoscopy suite regarding endoscopy.  I have reviewed the patients home medicines and have made adjustments as needed.   Patient taken to the endoscopy suite, we will plan disposition on GI recommendations. Final Clinical Impression(s) / ED Diagnoses Final diagnoses:  None    Rx / DC Orders ED Discharge Orders     None         [place] 04/07/24 2152    Vanetta Mulders, MD 04/08/24 1732

## 2024-04-07 NOTE — Consult Note (Signed)
 Chief Complaint: Acute dysphagia  Referring Provider: Dr. Adela Lank      ASSESSMENT AND PLAN;   #1.  Acute on chronic dysphagia-suggestive of esophageal food impaction   Plan: -EGD with FB disimpaction  Risks and benefits including risks of perforation, bleeding, aspiration were discussed.  I have discussed with ED, nursing staff and anesthesia.  HPI:    Raven [name] is a 43 y.o. female   With recurrent dysphagia Thought to have stricture just below UES Multiple dilatations with most recent dilatation on 06/15/2023  With feeling of food getting hung up after eating dinner. She has nausea Not able to handle secretions  EGD 06/15/2023 - Small hiatal hernia. - Z- line regular. - Normal esophagus otherwise - dilated to 18mm with good dilation effect at site of suspected subtle stricture just inferior to the UES. - Erythematous mucosa in the antrum. - Normal stomach otherwise - biopsies taken to rule out H pylori - Normal duodenal bulb and second portion of the duodenum. Past Medical History:  Diagnosis Date   Abdominal pain, left lower quadrant 03/27/2014   Abnormal CXR 05/03/2019   05/03/2019   Walked RA  2 laps @  approx 227ft each @ fast pace  stopped due to  End of study with sats 95% at end     Abnormal Pap smear    Achalasia    esphog stretched several times   Acute pain of both knees 09/07/2018   Allergy    seasonal allergies   Anxiety    on meds   Arthritis    generalized-on meds   Asthma    rarely uses inhaler-PRN-stopped smoking-symptoms decreased   ASTHMA 10/09/2008   Spirometry wnl  08/08/14 with active symptoms attributed to VCD     Blood transfusion 01/2006   [facility]   Cardiomyopathy [facility])    Cardiomyopathy, idiopathic (HCC) 01/09/2014   CHF (congestive heart failure) (HCC) 06/2020   on meds   Chronic bilateral low back pain 09/07/2018   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 01/18/2018   Chronically on opiate therapy 01/18/2018    Cigarette smoker 08/09/2014   Constipation due to opioid therapy    Depression    DEPRESSION 10/09/2008   Qualifier: Diagnosis of  By: Vernie Murders  -on meds   Dysphagia 08/10/2009   Qualifier: Diagnosis of  By: Myrtie Hawk, Amy S    Dyspnea 08/08/2014   Followed in Pulmonary clinic/ [facility]/ [place]  - 08/08/2014  Walked RA x 3 laps @ 185 ft each stopped due to  End of study, no sob or desat - Spirometry 08/08/14 wnl     Dysrhythmia    Esophageal reflux 11/30/2008   Centricity Description: ESOPHAGEAL REFLUX Qualifier: Diagnosis of  By: Arlyce Dice MD, Barbette Hair  Centricity Description: GERD Qualifier: Diagnosis of  By: Myrtie Hawk, Amy S    ESOPHAGEAL STRICTURE 08/10/2009   Qualifier: Diagnosis of  By: Myrtie Hawk, Amy S    Excessive daytime sleepiness 01/18/2018   Fibromyalgia    on meds   Gallstones and inflammation of gallbladder without obstruction 01/23/2015   GERD (gastroesophageal reflux disease)    on meds - diet controlled   Globus sensation    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   Heart murmur    Heart palpitations    tx with atenolol by steven klein    Hiatal hernia    History of abnormal Pap smear    History of chlamydia 1990's   History of PID  01/09/2013   HSV-2 infection    HTN (hypertension)    on meds   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   Occipital neuralgia of right side 04/21/2017   Palpitation 04/11/2011   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   Pneumonia 05/2013   PVC's (premature ventricular contractions)    S/P laparoscopic surgery 01/23/2015   Shortness of breath dyspnea    Sleep disorder, circadian, 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   Status post dilation of esophageal narrowing    Unspecified constipation 11/30/2008   Centricity Description: UNSPECIFIED CONSTIPATION Qualifier: Diagnosis of  By: Arlyce Dice MD, Barbette Hair  Centricity Description: CONSTIPATION Qualifier: Diagnosis of  By: Myrtie Hawk, Amy S    Upper airway cough syndrome 09/05/2014   Followed in Pulmonary clinic/ [facility]/ [place]  Onset  2002 p tonsilectomy  -Spirometry wnl  08/08/14  - referred to [facility] Voice center [facility] 09/05/2014 > inconclusive w/u by ENT / GI s f/u ov by either since 2016 > referred  Back to Dr Delford Field 10/20/2017  - Dg Es 01/27/2019  Spontaneous gastroesophageal reflux. 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    Past Surgical History:  Procedure Laterality Date   ADENOIDECTOMY  12/2002   BIOPSY  06/15/2023   Procedure: BIOPSY;  Surgeon: Benancio Deeds, MD;  Location: [facility];  Service: Gastroenterology;;   BLADDER SUSPENSION  06/11/2012   Procedure: TRANSVAGINAL TAPE (TVT) PROCEDURE;  Surgeon: Purcell Nails, MD;  Location: [facility];  Service: Gynecology;  Laterality: N/A;  Tension Free Vaginal Tape/cystoscopy   BREAST SURGERY  07/2001   tumor removed right breast   CHOLECYSTECTOMY N/A 01/23/2015   Procedure: LAPAROSCOPIC CHOLECYSTECTOMY ;  Surgeon: Claud Kelp, MD;  Location: [facility] OR;  Service: General;  Laterality: N/A;  case booked with cholangiogram but MD did not complete intraoperative cholangiogram    COLONOSCOPY  2020   1 polyp/TICS   CYSTOSCOPY  06/11/2012   Procedure: CYSTOSCOPY;  Surgeon: Purcell Nails, MD;  Location: [facility];  Service: Gynecology;  Laterality: N/A;   DILATION AND CURETTAGE OF UTERUS  1999   DILATION AND CURETTAGE OF UTERUS  01/2006   retained products   ESOPHAGEAL DILATION  01/30/2022   Procedure: ESOPHAGEAL DILATION;  Surgeon: Napoleon Form, MD;  Location: [facility];  Service: Endoscopy;;   ESOPHAGEAL MANOMETRY N/A 02/23/2019   Procedure: ESOPHAGEAL MANOMETRY (EM);  Surgeon: Napoleon Form, MD;  Location: [facility];  Service: Endoscopy;  Laterality: N/A;   ESOPHAGOGASTRODUODENOSCOPY     with esophageal dilation x 3   ESOPHAGOGASTRODUODENOSCOPY (EGD) WITH PROPOFOL N/A 09/25/2020   Procedure: ESOPHAGOGASTRODUODENOSCOPY (EGD) WITH PROPOFOL;  Surgeon: Benancio Deeds, MD;  Location: [facility];  Service: Gastroenterology;  Laterality: N/A;   ESOPHAGOGASTRODUODENOSCOPY (EGD) WITH PROPOFOL N/A 01/30/2022   Procedure: ESOPHAGOGASTRODUODENOSCOPY (EGD) WITH PROPOFOL;  Surgeon: Napoleon Form, MD;  Location: [facility];  Service: Endoscopy;  Laterality: N/A;   ESOPHAGOGASTRODUODENOSCOPY (EGD) WITH PROPOFOL N/A 06/15/2023   Procedure: ESOPHAGOGASTRODUODENOSCOPY (EGD) WITH PROPOFOL;  Surgeon: Benancio Deeds, MD;  Location: [facility];  Service: Gastroenterology;  Laterality: N/A;   FOREIGN BODY REMOVAL  06/11/2012   Procedure: REMOVAL FOREIGN BODY EXTREMITY;  Surgeon: Purcell Nails, MD;  Location: [facility];  Service: Gynecology;  Laterality: Right;  Removal of Implanon    LAPAROSCOPIC TUBAL LIGATION  06/11/2012   Procedure: LAPAROSCOPIC TUBAL LIGATION;  Surgeon: Purcell Nails, MD;  Location: [facility];  Service: Gynecology;  Laterality: Bilateral;  Laparoscopic Bilateral Fulgeration   POLYPECTOMY  2020   tics/1 polyp   SAVORY DILATION N/A 09/25/2020   Procedure: SAVORY DILATION;  Surgeon: Benancio Deeds, MD;  Location: [facility];  Service: Gastroenterology;  Laterality: N/A;   SAVORY DILATION N/A 06/15/2023   Procedure: SAVORY DILATION;  Surgeon: Benancio Deeds, MD;  Location: [facility];  Service: Gastroenterology;  Laterality: N/A;   TONSILLECTOMY      Family History  Problem Relation Age of Onset   Heart disease Father    Hypertension Father    Colon polyps Father 61   Stroke Mother    Hypertension Mother    Heart attack Mother    Breast cancer Mother    Neuropathy Mother    Arthritis Mother    Asthma Mother    Peripheral Artery  Disease Mother 6       had her leg amputated 2020   Neuropathy Sister    Arthritis Sister    Asthma Sister    Stomach cancer Paternal Uncle 84   Colon cancer Paternal Uncle 71   Colon polyps Paternal Uncle 32   Esophageal cancer Neg Hx    Rectal cancer Neg Hx     Social History   Tobacco Use   Smoking status: Former    Current packs/day: 0.00    Average packs/day: 0.3 packs/day for 15.0 years (3.8 ttl pk-yrs)    Types: Cigarettes    Start date: 04/28/2004    Quit date: 04/29/2019    Years since quitting: 4.9   Smokeless tobacco: Never   Tobacco comments:    stopped smoking 04-29-2019  Vaping Use   Vaping status: Never Used  Substance Use Topics   Alcohol use: No    Comment: special occassions   Drug use: No    Current Facility-Administered Medications  Medication Dose Route Frequency Provider Last Rate Last Admin   morphine (PF) 4 MG/ML injection 4 mg  4 mg Intravenous Once Prosperi, Christian H, PA-C       Current Outpatient Medications  Medication Sig Dispense Refill   dicyclomine (BENTYL) 10 MG capsule Take 1 capsule (10 mg total) by mouth 3 (three) times daily before meals. 90 capsule 1   LATUDA 60 MG TABS Take 60 mg by mouth daily.   0   linaclotide (LINZESS) 290 MCG CAPS capsule Take 1 capsule (290 mcg total) by mouth daily before breakfast. 90 capsule 1   LORazepam (ATIVAN) 2 MG tablet Take 2 mg by mouth 3 (three) times daily.     losartan (COZAAR) 25 MG tablet Take 1 tablet (25 mg total) by mouth daily. 90 tablet 3   metoprolol tartrate (LOPRESSOR) 25 MG tablet Take 0.5 tablets (12.5 mg total) by mouth 2 (two) times daily. 90 tablet 1   oxyCODONE-acetaminophen (PERCOCET) 10-325 MG tablet Take 1 tablet by mouth 4 (four) times daily. 120 tablet 0   pantoprazole sodium (PROTONIX) 40 mg Take 40 mg by mouth daily. 90 each 1   triamcinolone cream (KENALOG) 0.1 % Apply 1 Application topically 2 (two) times daily.      Allergies  Allergen Reactions   Codeine Shortness  Of Breath and Rash   Dust Mite Extract Shortness Of  Breath   Other Shortness Of Breath    Feathers, trees "almost everything outside"   Pollen Extract Shortness Of Breath   Promethazine Hives and Shortness Of Breath    Review of Systems:  neg     Physical Exam:    BP (!) 148/95 (BP Location: Right Arm)   Pulse 90   Temp 98.2 F (36.8 C) (Axillary)   Resp (!) 22   Wt 87 kg   SpO2 95%   BMI 28.32 kg/m  Wt Readings from Last 3 Encounters:  04/07/24 87 kg  06/15/23 87.1 kg  06/09/23 87.5 kg   Constitutional:  Well-developed, in no acute distress. Psychiatric: Normal mood and affect. Behavior is normal. HEENT: Pupils normal.  Conjunctivae are normal. No scleral icterus. Neck supple.  Cardiovascular: Normal rate, regular rhythm. No edema Pulmonary/chest: Effort normal and breath sounds normal. No wheezing, rales or rhonchi. Abdominal: Soft, nondistended. Nontender. Bowel sounds active throughout. There are no masses palpable. No hepatomegaly. Rectal: Deferred Neurological: Alert and oriented to person place and time. Skin: Skin is warm and dry. No rashes noted.  Data Reviewed: I have personally reviewed following labs and imaging studies  CBC:    Latest Ref Rng & Units 04/07/2024    7:34 PM 04/28/2023    4:25 PM 01/14/2022   10:03 AM  CBC  WBC 4.0 - 10.5 K/uL 11.1  8.9  8.2   Hemoglobin 12.0 - 15.0 g/dL 16.1  09.6  04.5   Hematocrit 36.0 - 46.0 % 36.8  41.2  40.7   Platelets 150 - 400 K/uL 207  221  202.0     CMP:    Latest Ref Rng & Units 04/07/2024    7:34 PM 04/28/2023    4:25 PM 01/14/2022   10:03 AM  CMP  Glucose 70 - 99 mg/dL 409  83  811   BUN 6 - 20 mg/dL 10  10  9    Creatinine 0.44 - 1.00 mg/dL 9.14  7.82  9.56   Sodium 135 - 145 mmol/L 139  141  139   Potassium 3.5 - 5.1 mmol/L 3.7  4.7  4.1   Chloride 98 - 111 mmol/L 111  105  106   CO2 22 - 32 mmol/L 23  20  25    Calcium 8.9 - 10.3 mg/dL 8.6  9.5  9.1   Total Protein 6.0 - 8.3 g/dL   7.3   Total  Bilirubin 0.2 - 1.2 mg/dL   0.6   Alkaline Phos 39 - 117 U/L   55   AST 0 - 37 U/L   17   ALT 0 - 35 U/L   22        Edman Circle, MD 04/07/2024, 8:53 PM  Cc: No ref. provider found

## 2024-04-07 NOTE — Anesthesia Postprocedure Evaluation (Signed)
 Anesthesia Post Note  Patient: Raven [name]  Procedure(s) Performed: EGD (ESOPHAGOGASTRODUODENOSCOPY)     Patient location during evaluation: PACU Anesthesia Type: General Level of consciousness: awake and alert Pain management: pain level controlled Vital Signs Assessment: post-procedure vital signs reviewed and stable Respiratory status: spontaneous breathing, nonlabored ventilation, respiratory function stable and patient connected to nasal cannula oxygen Cardiovascular status: blood pressure returned to baseline and stable Postop Assessment: no apparent nausea or vomiting Anesthetic complications: no  No notable events documented.  Last Vitals:  Vitals:   04/07/24 2207 04/07/24 2213  BP: 130/80 134/82  Pulse: (!) 110 (!) 108  Resp: 20 18  Temp:    SpO2: 98% 98%    Last Pain:  Vitals:   04/07/24 2213  TempSrc:   PainSc: 8                  Kennieth Rad

## 2024-04-08 ENCOUNTER — Other Ambulatory Visit: Payer: Self-pay

## 2024-04-08 ENCOUNTER — Telehealth: Payer: Self-pay

## 2024-04-08 DIAGNOSIS — R1310 Dysphagia, unspecified: Secondary | ICD-10-CM | POA: Diagnosis not present

## 2024-04-08 MED ORDER — SUCRALFATE 1 GM/10ML PO SUSP: 1.0000 g | Freq: Three times a day (TID) | ORAL | 0 refills | Status: DC

## 2024-04-08 MED ORDER — SUCRALFATE 1 GM/10ML PO SUSP: 1.0000 g | Freq: Three times a day (TID) | ORAL | 0 refills | Status: AC | PRN

## 2024-04-08 MED ORDER — LIDOCAINE VISCOUS HCL 2 % MT SOLN
15.0000 mL | Freq: Once | OROMUCOSAL | Status: AC
  Administered 2024-04-08: 15 mL via ORAL
  Filled 2024-04-08: qty 15

## 2024-04-08 MED ORDER — ALUM & MAG HYDROXIDE-SIMETH 200-200-20 MG/5ML PO SUSP
30.0000 mL | Freq: Once | ORAL | Status: AC
  Administered 2024-04-08: 30 mL via ORAL
  Filled 2024-04-08: qty 30

## 2024-04-08 NOTE — Telephone Encounter (Signed)
 Order placed for barium swallow. Rad scheduling to contact pt regarding appt.

## 2024-04-08 NOTE — ED Provider Notes (Signed)
 I assumed care of this patient from previous provider.  Please see their note for further details of history, exam, and MDM.   Briefly patient is a 43 y.o. female who presented for food impaction.  Taken to endoscopy suite by GI.  Return to the emergency department for reevaluation.  She is tolerating her secretions and oral intake.  The patient appears reasonably screened and/or stabilized for discharge and I doubt any other medical condition or other [facility] requiring further screening, evaluation, or treatment in the ED at this time. I have discussed the findings, Dx and Tx plan with the patient/family who expressed understanding and agree(s) with the plan. Discharge instructions discussed at length. The patient/family was given strict return precautions who verbalized understanding of the instructions. No further questions at time of discharge.  Disposition: Discharge  Condition: Good  ED Discharge Orders          Ordered    sucralfate (CARAFATE) 1 GM/10ML suspension  3 times daily with meals & bedtime,   Status:  Discontinued        04/08/24 0235    sucralfate (CARAFATE) 1 GM/10ML suspension  3 times daily PRN        04/08/24 0235             Follow Up: Benancio Deeds, MD [address] [place] [address] [phone]  Call  as needed  Leilani Able, MD [address] [place] [address] [phone]  Call  as needed          Nira Conn, MD 04/08/24 0236

## 2024-04-08 NOTE — Discharge Instructions (Signed)
 GI recommendations: - Soft diet today, then regular in AM. She needs to chew foods especially meats and breads well and eat slowly. - Continue present medications including Protonix 40 mg p.o. daily. - Recommend follow-up with [facility] as outpatient. I do recommend barium swallow followed by repeat EGD with dilatation if needed. I will get in touch with Dr. Adela Lank - The findings and recommendations were discussed with the patient's family.

## 2024-04-08 NOTE — Telephone Encounter (Signed)
-----   Message from Benancio Deeds sent at 04/08/2024 10:16 AM EDT ----- Regarding: FW: Needs FU Can someone please help coordinate a barium swallow with tablet to further evaluate dysphagia.  I suspect she will need repeat EGD at the hospital (I believe her case has been done there due to difficult airway, in the past). Thanks ----- Message ----- From: Lynann Bologna, MD Sent: 04/07/2024  10:06 PM EDT To: Benancio Deeds, MD Subject: Needs FU                                       Abram Sander,  She came in with food impaction EGD tonight- had spontaneous disimpaction Would need Ba Swallow then rpt dil if needed.  RG

## 2024-04-10 ENCOUNTER — Encounter (HOSPITAL_COMMUNITY): Payer: Self-pay | Admitting: Gastroenterology

## 2024-04-15 ENCOUNTER — Other Ambulatory Visit (HOSPITAL_COMMUNITY): Payer: Self-pay | Admitting: *Deleted

## 2024-04-15 DIAGNOSIS — R1310 Dysphagia, unspecified: Secondary | ICD-10-CM

## 2024-04-18 ENCOUNTER — Ambulatory Visit (HOSPITAL_COMMUNITY)
Admission: RE | Admit: 2024-04-18 | Discharge: 2024-04-18 | Disposition: A | Discharge status: Home / Self Care | Source: Ambulatory Visit | Attending: Gastroenterology | Admitting: Gastroenterology

## 2024-04-18 ENCOUNTER — Ambulatory Visit (HOSPITAL_COMMUNITY)
Admission: RE | Admit: 2024-04-18 | Discharge: 2024-04-18 | Disposition: A | Discharge status: Home / Self Care | Source: Ambulatory Visit | Attending: Family Medicine | Admitting: Family Medicine

## 2024-04-18 ENCOUNTER — Ambulatory Visit (HOSPITAL_COMMUNITY)
Admission: RE | Admit: 2024-04-18 | Discharge: 2024-04-18 | Discharge status: Home / Self Care | Source: Ambulatory Visit | Attending: Family Medicine

## 2024-04-18 DIAGNOSIS — R1314 Dysphagia, pharyngoesophageal phase: Secondary | ICD-10-CM | POA: Insufficient documentation

## 2024-04-18 DIAGNOSIS — R1310 Dysphagia, unspecified: Secondary | ICD-10-CM

## 2024-04-18 DIAGNOSIS — K222 Esophageal obstruction: Secondary | ICD-10-CM | POA: Diagnosis not present

## 2024-04-18 NOTE — Therapy (Signed)
 Modified Barium Swallow Study  Patient Details  Name: CAILEE BLANKE MRN: [id number] Date of Birth: 27-Apr-1981  Today's Date: 04/18/2024  Modified Barium Swallow completed.  Full report located under Chart Review in the Imaging Section.  History of Present Illness Kanasia Gayman is a 43 y.o. female with PMH including but not limited to: achalasia, anxiety, asthma, CHF, dysphagia, esophageal reflux, esophageal stricture s/p multiple dilations with most recent being 06/15/23, fibromyalgia, GERD, HTN, vocal coard paralysis. She presented to this OP MBS due to dysphagia symptoms worsening with sensation of food getting stuck, choking and coughing when eating. She indicated by pointing to right side of neck approximately at level of hyoid where she felt food getting stuck. She crushes her pills which she has done for the past 10 years due to inability to swallow pills. She was recently at the hospital on 04/07/24 for symptoms suggestive of esophageal food impaction. EGD performed and reported spontaneous food disimpaction, no definite stricture noted.   Clinical Impression Patient presents with an oropharyngeal swallow that is largely [facility] and without aspiration or penetration of liquids or solids. Swallow initiated at level of vallecular sinus and posterior laryngeal surface of the epiglottis with thin liquids. With straw sips of thin liquids, patient with instance of delay with head of bolus transiting into pyriform sinus prior to swallow initiation. No significant amount of oral or pharyngeal residuals. Thicker, more dense boluses were slower to transit through PES and through upper esophagus. Trace to mild amount of residuals in esophagus just below PES was observed, which cleared with subsequent swallows and no observed retrograde movement of barium. SLP not recommending skilled intervention but recommend patient f/u with GI MD to discuss results. Factors that may increase risk of adverse event in  presence of aspiration Roderick Civatte & Jessy Morocco 2021): Respiratory or GI disease  Swallow Evaluation Recommendations Recommendations: PO diet PO Diet Recommendation: Regular;Thin liquids (Level 0)    Jacqualine Mater, MA, CCC-SLP Speech Therapy

## 2024-04-19 ENCOUNTER — Encounter: Payer: Self-pay | Admitting: Gastroenterology

## 2024-04-19 ENCOUNTER — Other Ambulatory Visit: Payer: Self-pay

## 2024-04-19 DIAGNOSIS — R1310 Dysphagia, unspecified: Secondary | ICD-10-CM

## 2024-04-19 DIAGNOSIS — K222 Esophageal obstruction: Secondary | ICD-10-CM

## 2024-04-19 NOTE — Progress Notes (Signed)
 Patient has been scheduled for an EGD with dilation at [facility] on 6-5, Thursday at 11:00 am case #[id number]. MyChart message sent to patient with appointment information, and previsit information

## 2024-05-11 ENCOUNTER — Telehealth: Payer: Self-pay | Admitting: *Deleted

## 2024-05-11 NOTE — Telephone Encounter (Signed)
 Pt is already scheduled at [facility].  Thank you, PV

## 2024-05-11 NOTE — Telephone Encounter (Signed)
 Team,  This pt's cardiac EF is below the [facility] standard.  Her procedure will need to be done at the hospital.  Thanks,  Rogena Class

## 2024-05-17 ENCOUNTER — Ambulatory Visit (AMBULATORY_SURGERY_CENTER)

## 2024-05-17 ENCOUNTER — Encounter: Payer: Self-pay | Admitting: Gastroenterology

## 2024-05-17 VITALS — Ht 69.0 in | Wt 190.0 lb

## 2024-05-17 DIAGNOSIS — R1310 Dysphagia, unspecified: Secondary | ICD-10-CM

## 2024-05-17 DIAGNOSIS — K222 Esophageal obstruction: Secondary | ICD-10-CM

## 2024-05-17 NOTE — Progress Notes (Signed)
 No egg or soy allergy known to patient  No issues known to pt with past sedation with any surgeries or procedures Patient denies ever being told they had issues or difficulty with intubation  No FH of Malignant Hyperthermia Pt is not on diet pills Pt is not on  home 02  Pt is not on blood thinners  Pt denies issues with constipation  No A fib or A flutter Have any cardiac testing pending-- no  LOA: independent    PV completed with patient. Prep instructions sent via mychart and home address.

## 2024-05-25 ENCOUNTER — Telehealth: Payer: Self-pay | Admitting: Gastroenterology

## 2024-05-25 NOTE — Telephone Encounter (Addendum)
 Procedure:Endoscopy Procedure date: 06/02/24 Procedure location: [facility] Arrival Time: 6:00 am Spoke with the patient Y/N:   No, Tried to call pt 05/25/24 @ 9:42 am, phone rang until it started beeping, unable to leave a message, mychart message sent No, Tried to call pt 05/26/24 @ 2:28 pm, phone rang until it started beeping,  Any prep concerns? No  Has the patient obtained the prep from the pharmacy ? Noprep needed Do you have a care partner and transportation: ___ Any additional concerns? ___

## 2024-05-26 ENCOUNTER — Encounter (HOSPITAL_COMMUNITY): Payer: Self-pay | Admitting: Gastroenterology

## 2024-05-26 NOTE — Progress Notes (Signed)
 Attempted to obtain medical history via telephone, unable to reach at this time. Unable to leave voicemail to return pre surgical testing department's phone call,due to message stating call could not be completed at this time, will re attempt.

## 2024-05-27 NOTE — Telephone Encounter (Signed)
 Noted

## 2024-06-02 ENCOUNTER — Encounter (HOSPITAL_COMMUNITY)
Admission: RE | Disposition: A | Discharge status: Home / Self Care | Payer: Self-pay | Source: Ambulatory Visit | Attending: Gastroenterology

## 2024-06-02 ENCOUNTER — Other Ambulatory Visit: Payer: Self-pay

## 2024-06-02 ENCOUNTER — Ambulatory Visit (HOSPITAL_COMMUNITY): Admitting: Certified Registered"

## 2024-06-02 ENCOUNTER — Ambulatory Visit (HOSPITAL_COMMUNITY)
Admission: RE | Admit: 2024-06-02 | Discharge: 2024-06-02 | Disposition: A | Discharge status: Home / Self Care | Source: Ambulatory Visit | Attending: Gastroenterology | Admitting: Gastroenterology

## 2024-06-02 ENCOUNTER — Encounter (HOSPITAL_COMMUNITY): Payer: Self-pay | Admitting: Gastroenterology

## 2024-06-02 DIAGNOSIS — K449 Diaphragmatic hernia without obstruction or gangrene: Secondary | ICD-10-CM | POA: Diagnosis not present

## 2024-06-02 DIAGNOSIS — M797 Fibromyalgia: Secondary | ICD-10-CM | POA: Insufficient documentation

## 2024-06-02 DIAGNOSIS — I38 Endocarditis, valve unspecified: Secondary | ICD-10-CM | POA: Insufficient documentation

## 2024-06-02 DIAGNOSIS — K222 Esophageal obstruction: Secondary | ICD-10-CM

## 2024-06-02 DIAGNOSIS — R1310 Dysphagia, unspecified: Secondary | ICD-10-CM

## 2024-06-02 DIAGNOSIS — Z87891 Personal history of nicotine dependence: Secondary | ICD-10-CM | POA: Insufficient documentation

## 2024-06-02 DIAGNOSIS — Z79891 Long term (current) use of opiate analgesic: Secondary | ICD-10-CM | POA: Insufficient documentation

## 2024-06-02 DIAGNOSIS — I509 Heart failure, unspecified: Secondary | ICD-10-CM | POA: Insufficient documentation

## 2024-06-02 DIAGNOSIS — F418 Other specified anxiety disorders: Secondary | ICD-10-CM | POA: Diagnosis not present

## 2024-06-02 DIAGNOSIS — I110 Hypertensive heart disease with heart failure: Secondary | ICD-10-CM

## 2024-06-02 HISTORY — PX: ESOPHAGOGASTRODUODENOSCOPY: SHX5428

## 2024-06-02 LAB — GLUCOSE, CAPILLARY: Glucose-Capillary: 114 mg/dL — ABNORMAL HIGH (ref 70–99)

## 2024-06-02 SURGERY — EGD (ESOPHAGOGASTRODUODENOSCOPY)
Anesthesia: Monitor Anesthesia Care

## 2024-06-02 MED ORDER — SODIUM CHLORIDE 0.9% FLUSH: 3.0000 mL | INTRAVENOUS | Status: DC | PRN

## 2024-06-02 MED ORDER — PROPOFOL 1000 MG/100ML IV EMUL
INTRAVENOUS | Status: AC
  Filled 2024-06-02: qty 100

## 2024-06-02 MED ORDER — LIDOCAINE 2% (20 MG/ML) 5 ML SYRINGE
INTRAMUSCULAR | Status: DC | PRN
  Administered 2024-06-02: 80 mg via INTRAVENOUS

## 2024-06-02 MED ORDER — SODIUM CHLORIDE 0.9% FLUSH: 3.0000 mL | Freq: Two times a day (BID) | INTRAVENOUS | Status: DC

## 2024-06-02 MED ORDER — PROPOFOL 500 MG/50ML IV EMUL
INTRAVENOUS | Status: DC | PRN
  Administered 2024-06-02: 150 ug/kg/min via INTRAVENOUS

## 2024-06-02 MED ORDER — SODIUM CHLORIDE 0.9 % IV SOLN: INTRAVENOUS | Status: DC

## 2024-06-02 MED ORDER — SUCRALFATE 1 GM/10ML PO SUSP: 1.0000 g | Freq: Three times a day (TID) | ORAL | 0 refills | Status: AC | PRN

## 2024-06-02 MED ORDER — PROPOFOL 10 MG/ML IV BOLUS
INTRAVENOUS | Status: DC | PRN
  Administered 2024-06-02: 30 mg via INTRAVENOUS
  Administered 2024-06-02: 20 mg via INTRAVENOUS

## 2024-06-02 NOTE — Progress Notes (Signed)
 Pt complains of a 9 out of 10 pain while swallowing following a EGD with dilation. The pt has had this procedure many times and has always had pain with swallowing to follow the procedure. Dr. General Kenner is aware and at pt's bedside. After discussing with the pt Dr. General Kenner has called in Carafate  to help with the pt's pain and the pt is agreeable to this. Pt is ok being discharged home with the plan. Maxie Spaniel, RN

## 2024-06-02 NOTE — Transfer of Care (Signed)
 Immediate Anesthesia Transfer of Care Note  Patient: Raven [name]  Procedure(s) Performed: EGD (ESOPHAGOGASTRODUODENOSCOPY)  Patient Location: PACU  Anesthesia Type:MAC  Level of Consciousness: awake, alert , and oriented  Airway & Oxygen Therapy: Patient Spontanous Breathing and Patient connected to face mask oxygen  Post-op Assessment: Report given to RN and Post -op Vital signs reviewed and stable  Post vital signs: Reviewed and stable  Last Vitals:  Vitals Value Taken Time  BP    Temp    Pulse 81 06/02/24 0800  Resp 19 06/02/24 0800  SpO2 100 % 06/02/24 0800  Vitals shown include unfiled device data.  Last Pain:  Vitals:   06/02/24 0640  TempSrc: Temporal  PainSc: 0-No pain         Complications: No notable events documented.

## 2024-06-02 NOTE — Anesthesia Postprocedure Evaluation (Signed)
 Anesthesia Post Note  Patient: Raven [name]  Procedure(s) Performed: EGD (ESOPHAGOGASTRODUODENOSCOPY)     Patient location during evaluation: PACU Anesthesia Type: MAC Level of consciousness: awake and alert Pain management: pain level controlled Vital Signs Assessment: post-procedure vital signs reviewed and stable Respiratory status: spontaneous breathing, nonlabored ventilation, respiratory function stable and patient connected to nasal cannula oxygen Cardiovascular status: stable and blood pressure returned to baseline Postop Assessment: no apparent nausea or vomiting Anesthetic complications: no   No notable events documented.  Last Vitals:  Vitals:   06/02/24 0810 06/02/24 0821  BP: 138/77 (!) 156/91  Pulse: 81 78  Resp: 11 11  Temp:    SpO2: 99% 100%    Last Pain:  Vitals:   06/02/24 0821  TempSrc:   PainSc: 9                  Leslye Rast

## 2024-06-02 NOTE — Op Note (Addendum)
 [facility] Patient Name: Raven [name] Procedure Date: 06/02/2024 MRN: [id number] Attending MD: Lendon Queen. General Kenner , MD, [id number] Date of Birth: [birth year] CSN: [id number] Age: 43 Admit Type: Outpatient Procedure:                Upper GI endoscopy Indications:              Dysphagia - history of proximal esophagus UES                            stricture, s/p dilation in the past to 18mm with                            good effect. Food impaction at the site 4/10,                            recurrent dysphagia. Providers:                Lendon Queen. General Kenner, MD, Marden Shaggy, RN, Joline Ned, Technician Referring MD:              Medicines:                Monitored Anesthesia Care Complications:            No immediate complications. Estimated blood loss:                            Minimal. Estimated Blood Loss:     Estimated blood loss was minimal. Procedure:                Pre-Anesthesia Assessment:                           - Prior to the procedure, a History and Physical                            was performed, and patient medications and                            allergies were reviewed. The patient's tolerance of                            previous anesthesia was also reviewed. The risks                            and benefits of the procedure and the sedation                            options and risks were discussed with the patient.                            All questions were answered, and informed consent  was obtained. Prior Anticoagulants: The patient has                            taken no anticoagulant or antiplatelet agents. ASA                            Grade Assessment: III - A patient with severe                            systemic disease. After reviewing the risks and                            benefits, the patient was deemed in satisfactory                            condition to  undergo the procedure.                           After obtaining informed consent, the endoscope was                            passed under direct vision. Throughout the                            procedure, the patient's blood pressure, pulse, and                            oxygen saturations were monitored continuously. The                            [id number]-H190 ([id number] Olympus endoscope was introduced                            through the mouth, and advanced to the second part                            of duodenum. The upper GI endoscopy was                            accomplished without difficulty. The patient                            tolerated the procedure well. Scope In: Scope Out: Findings:      Esophagogastric landmarks were identified: the Z-line was found at 39       cm, the gastroesophageal junction was found at 39 cm and the site of       hiatal narrowing was found at 40 cm from the incisors.      A 1 cm hiatal hernia was present.      The exam of the esophagus was otherwise normal, hard to see subtle       stricture just inferior to the UES.      A guidewire was placed and the scope was withdrawn. Empiric dilation was       performed in the entire esophagus  with a Savary dilator with mild       resistance at 17 mm and 18 mm. Relook endoscopy showed appropriate       dilation effect just inferior to the UES - 2 mucosal wrents, appropriate       post dilation.      The entire examined stomach was normal.      The examined duodenum was normal. Impression:               - Esophagogastric landmarks identified.                           - 1 cm hiatal hernia.                           - Normal esophagus - dilated to 18mm with good                            dilation effect at subtle stricture just inferior                            to the UES.                           - Normal stomach.                           - Normal examined duodenum. Moderate Sedation:      No  moderate sedation, case performed with MAC Recommendation:           - Patient has a contact number available for                            emergencies. The signs and symptoms of potential                            delayed complications were discussed with the                            patient. Return to normal activities tomorrow.                            Written discharge instructions were provided to the                            patient.                           - Post dilation diet                           - Continue present medications.                           - Repeat EGD with dilation in 6-12 months to help                            prevent recurrent symptoms /  impaction. Given her                            course to date with recurrent symptoms I think                            scheduled dilations may be best for this patient to                            minimize symptom recurrence in the future Procedure Code(s):        --- Professional ---                           [phone], Esophagogastroduodenoscopy, flexible,                            transoral; with insertion of guide wire followed by                            passage of dilator(s) through esophagus over guide                            wire Diagnosis Code(s):        --- Professional ---                           K44.9, Diaphragmatic hernia without obstruction or                            gangrene                           R13.10, Dysphagia, unspecified CPT copyright 2022 American Medical Association. All rights reserved. The codes documented in this report are preliminary and upon coder review may  be revised to meet current compliance requirements. Landon Pinion P. Elizah Mierzwa, MD 06/02/2024 8:02:40 AM This report has been signed electronically. Number of Addenda: 0

## 2024-06-02 NOTE — H&P (Signed)
 [facility] History and Physical   Primary Care Physician:  Danella Dunn, MD   Reason for Procedure:   Dysphagia, esophageal stricture  Plan:    EGD with dilation     HPI: Raven [name] is a 43 y.o. female  here for EGD for dysphagia. She has a proximal esophagus stricture that has been stretched periodically in the past. Actually presented with a food impaction on 04/07/24 - EGD at the time showed the food had passed by the time the exam was done but suspected to be site of proximal esophageal stricture. Last dilated 06/15/23 to 18mm. She has had reliable relief of symptoms with dilation in the past. Symptoms have slowly recurred again over time.   Case done at the hospital for anesthesia support, given history of CHF, EF 30%. She  otherwise feels well without any cardiopulmonary symptoms today.    I have discussed risks / benefits of anesthesia and endoscopic procedure with Raven [name] and they wish to proceed with the exams as outlined today.    Past Medical History:  Diagnosis Date   Abdominal pain, left lower quadrant 03/27/2014   Abnormal CXR 05/03/2019   05/03/2019   Walked RA  2 laps @  approx 221ft each @ fast pace  stopped due to  End of study with sats 95% at end     Abnormal Pap smear    Achalasia    esphog stretched several times   Acute pain of both knees 09/07/2018   Allergy    seasonal allergies   Anxiety    on meds   Arthritis    generalized-on meds   Asthma    rarely uses inhaler-PRN-stopped smoking-symptoms decreased   ASTHMA 10/09/2008   Spirometry wnl  08/08/14 with active symptoms attributed to VCD     Blood transfusion 01/2006   [facility]   Cardiomyopathy [facility])    Cardiomyopathy, idiopathic (HCC) 01/09/2014   CHF (congestive heart failure) (HCC) 06/2020   on meds   Chronic bilateral low back pain 09/07/2018   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 01/18/2018   Chronically on opiate therapy 01/18/2018   Cigarette smoker  08/09/2014   Constipation due to opioid therapy    Depression    DEPRESSION 10/09/2008   Qualifier: Diagnosis of  By: Cala Castleman  -on meds   Dysphagia 08/10/2009   Qualifier: Diagnosis of  By: Shelva Dice, Amy S    Dyspnea 08/08/2014   Followed in Pulmonary clinic/ [facility]/ [facility]  - 08/08/2014  Walked RA x 3 laps @ 185 ft each stopped due to  End of study, no sob or desat - Spirometry 08/08/14 wnl     Dysrhythmia    Esophageal reflux 11/30/2008   Centricity Description: ESOPHAGEAL REFLUX Qualifier: Diagnosis of  By: Arvie Latus MD, Moishe Angel  Centricity Description: GERD Qualifier: Diagnosis of  By: Shelva Dice, Amy S    ESOPHAGEAL STRICTURE 08/10/2009   Qualifier: Diagnosis of  By: Shelva Dice, Amy S    Excessive daytime sleepiness 01/18/2018   Fibromyalgia    on meds   Gallstones and inflammation of gallbladder without obstruction 01/23/2015   GERD (gastroesophageal reflux disease)    on meds - diet controlled   Globus sensation    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   Heart murmur    Heart palpitations    tx with atenolol  by Dimple Bastyr klein    Hiatal hernia    History of abnormal Pap smear    History of  chlamydia 1990's   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   HSV-2 infection    HTN (hypertension)    on meds   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   Occipital neuralgia of right side 04/21/2017   Palpitation 04/11/2011   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   Pneumonia 05/2013   PVC's (premature ventricular contractions)    S/P laparoscopic surgery 01/23/2015   Shortness of breath dyspnea    Sleep disorder, circadian, 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   Status post dilation of esophageal narrowing    Unspecified constipation 11/30/2008   Centricity Description: UNSPECIFIED CONSTIPATION Qualifier: Diagnosis of  By: Arvie Latus MD, Moishe Angel  Centricity Description: CONSTIPATION Qualifier: Diagnosis of  By: Shelva Dice, Amy S    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    Past Surgical History:  Procedure Laterality Date   ADENOIDECTOMY  12/2002   BIOPSY  06/15/2023   Procedure: BIOPSY;  Surgeon: Ace Holder, MD;  Location: [facility];  Service: Gastroenterology;;   BLADDER SUSPENSION  06/11/2012   Procedure: TRANSVAGINAL TAPE (TVT) PROCEDURE;  Surgeon: Madelene Schanz, MD;  Location: [facility];  Service: Gynecology;  Laterality: N/A;  Tension Free Vaginal Tape/cystoscopy   BREAST SURGERY  07/2001   tumor removed right breast   CHOLECYSTECTOMY N/A 01/23/2015   Procedure: LAPAROSCOPIC CHOLECYSTECTOMY ;  Surgeon: Boyce Byes, MD;  Location: [facility] OR;  Service: General;  Laterality: N/A;  case booked with cholangiogram but MD did not complete intraoperative cholangiogram    COLONOSCOPY  2020   1 polyp/TICS   CYSTOSCOPY  06/11/2012   Procedure: CYSTOSCOPY;  Surgeon: Madelene Schanz, MD;  Location: [facility];  Service: Gynecology;  Laterality: N/A;   DILATION AND CURETTAGE OF UTERUS  1999   DILATION AND CURETTAGE OF UTERUS  01/2006   retained products   ESOPHAGEAL DILATION  01/30/2022   Procedure: ESOPHAGEAL DILATION;  Surgeon: Sergio Dandy, MD;  Location: [facility];  Service: Endoscopy;;   ESOPHAGEAL MANOMETRY N/A 02/23/2019   Procedure: ESOPHAGEAL MANOMETRY (EM);  Surgeon: Sergio Dandy, MD;  Location:  [facility];  Service: Endoscopy;  Laterality: N/A;   ESOPHAGOGASTRODUODENOSCOPY     with esophageal dilation x 3   ESOPHAGOGASTRODUODENOSCOPY N/A 04/07/2024   Procedure: EGD (ESOPHAGOGASTRODUODENOSCOPY);  Surgeon: Lajuan Pila, MD;  Location: [facility];  Service: Gastroenterology;  Laterality: N/A;   ESOPHAGOGASTRODUODENOSCOPY (EGD) WITH PROPOFOL  N/A 09/25/2020   Procedure: ESOPHAGOGASTRODUODENOSCOPY (EGD) WITH PROPOFOL ;  Surgeon: Ace Holder, MD;  Location: [facility];  Service: Gastroenterology;  Laterality: N/A;   ESOPHAGOGASTRODUODENOSCOPY (EGD) WITH PROPOFOL  N/A 01/30/2022   Procedure: ESOPHAGOGASTRODUODENOSCOPY (EGD) WITH PROPOFOL ;  Surgeon: Sergio Dandy, MD;  Location: [facility];  Service: Endoscopy;  Laterality: N/A;   ESOPHAGOGASTRODUODENOSCOPY (EGD) WITH PROPOFOL  N/A 06/15/2023   Procedure: ESOPHAGOGASTRODUODENOSCOPY (EGD)  WITH PROPOFOL ;  Surgeon: Ace Holder, MD;  Location: [facility];  Service: Gastroenterology;  Laterality: N/A;   FOREIGN BODY REMOVAL  06/11/2012   Procedure: REMOVAL FOREIGN BODY EXTREMITY;  Surgeon: Madelene Schanz, MD;  Location: [facility];  Service: Gynecology;  Laterality: Right;  Removal of Implanon    LAPAROSCOPIC TUBAL LIGATION  06/11/2012   Procedure: LAPAROSCOPIC TUBAL LIGATION;  Surgeon: Madelene Schanz, MD;  Location: [facility];  Service: Gynecology;  Laterality: Bilateral;  Laparoscopic Bilateral Fulgeration   POLYPECTOMY  2020   tics/1 polyp   SAVORY DILATION N/A 09/25/2020   Procedure: SAVORY DILATION;  Surgeon: Ace Holder, MD;  Location: [facility];  Service: Gastroenterology;  Laterality: N/A;   SAVORY DILATION N/A 06/15/2023   Procedure: SAVORY DILATION;  Surgeon: Ace Holder, MD;  Location: [facility];  Service: Gastroenterology;  Laterality: N/A;   TONSILLECTOMY      Prior to Admission medications   Medication Sig Start Date End Date Taking? Authorizing Provider  ciprofloxacin  (CIPRO ) 250 MG  tablet Take 250 mg by mouth 2 (two) times daily. 03/09/24  Yes [provider]  cyclobenzaprine (FLEXERIL) 10 MG tablet Take 10 mg by mouth 3 (three) times daily. 04/01/24  Yes [provider]  glipiZIDE 2.5 MG TABS Take 1 tablet by mouth every morning. 01/25/24  Yes [provider]  linaclotide  (LINZESS ) 290 MCG CAPS capsule Take 1 capsule (290 mcg total) by mouth daily before breakfast. 01/14/22  Yes Kennedy-Smith, Colleen M, NP  LORazepam  (ATIVAN ) 2 MG tablet Take 2 mg by mouth 3 (three) times daily.   Yes [provider]  oxyCODONE -acetaminophen  (PERCOCET) 10-325 MG tablet Take 1 tablet by mouth 4 (four) times daily. 03/03/23  Yes   phentermine (ADIPEX-P) 37.5 MG tablet Take 37.5 mg by mouth daily. 04/08/24  Yes [provider]  sucralfate  (CARAFATE ) 1 GM/10ML suspension Take 10 mLs (1 g total) by mouth 3 (three) times daily as needed. 04/08/24  Yes Cardama, Camila Cecil, MD  losartan  (COZAAR ) 25 MG tablet Take 1 tablet (25 mg total) by mouth daily. 06/09/23 05/17/24  Morey Ar, NP  metoprolol  tartrate (LOPRESSOR ) 25 MG tablet Take 0.5 tablets (12.5 mg total) by mouth 2 (two) times daily. Patient not taking: Reported on 05/17/2024 04/30/23 07/29/23  Acharya, Gayatri A, MD  valACYclovir  (VALTREX ) 500 MG tablet Take 500 mg by mouth daily. 04/01/24   [provider]    No current facility-administered medications for this encounter.    Allergies as of 04/19/2024 - Review Complete 04/08/2024  Allergen Reaction Noted   Codeine Shortness Of Breath and Rash 06/10/2023   Dust mite extract Shortness Of Breath 01/28/2015   Other Shortness Of Breath 01/19/2015   Pollen extract Shortness Of Breath 01/28/2015   Promethazine  Hives and Shortness Of Breath 06/09/2023    Family History  Problem Relation Age of Onset   Heart disease Father    Hypertension Father    Colon polyps Father 25   Stroke Mother    Hypertension Mother    Heart attack Mother     Breast cancer Mother    Neuropathy Mother    Arthritis Mother    Asthma Mother    Peripheral Artery Disease Mother 61       had her leg amputated 2020   Neuropathy Sister    Arthritis Sister    Asthma Sister    Stomach cancer Paternal Uncle 32   Colon cancer Paternal Uncle 27   Colon polyps Paternal Uncle  65   Esophageal cancer Neg Hx    Rectal cancer Neg Hx     Social History   Socioeconomic History   Marital status: Single    Spouse name: Not on file   Number of children: 2   Years of education: College   Highest education level: Not on file  Occupational History   Not on file  Tobacco Use   Smoking status: Former    Current packs/day: 0.00    Average packs/day: 0.3 packs/day for 15.0 years (3.8 ttl pk-yrs)    Types: Cigarettes    Start date: 04/28/2004    Quit date: 04/29/2019    Years since quitting: 5.0   Smokeless tobacco: Never   Tobacco comments:    stopped smoking 04-29-2019  Vaping Use   Vaping status: Never Used  Substance and Sexual Activity   Alcohol use: No    Comment: special occassions   Drug use: No   Sexual activity: Yes    Birth control/protection: Surgical  Other Topics Concern   Not on file  Social History Narrative   Lives with fiance and children   Caffeine use:  Hot ginger tea- daily   Left-handed   Social Drivers of Corporate investment banker Strain: Not on file  Food Insecurity: Not on file  Transportation Needs: Not on file  Physical Activity: Not on file  Stress: Not on file  Social Connections: Not on file  Intimate Partner Violence: Not on file    Review of Systems: All other review of systems negative except as mentioned in the HPI.  Physical Exam: Vital signs BP (!) 149/86   Pulse 84   Temp (!) 97.4 F (36.3 C) (Temporal)   Resp 18   Ht 5\' 9"  (1.753 m)   Wt 81.6 kg   SpO2 100%   BMI 26.58 kg/m   General:   Alert,  Well-developed, pleasant and cooperative in NAD Lungs:  Clear throughout to auscultation.    Heart:  Regular rate and rhythm Abdomen:  Soft, nontender and nondistended.   Neuro/Psych:  Alert and cooperative. Normal mood and affect. A and O x 3  Christi Coward, MD [facility]

## 2024-06-02 NOTE — Discharge Instructions (Signed)

## 2024-06-02 NOTE — Anesthesia Preprocedure Evaluation (Signed)
 Anesthesia Evaluation  Patient identified by MRN, date of birth, ID band Patient awake    Reviewed: Allergy & Precautions, NPO status , Patient's Chart, lab work & pertinent test results, reviewed documented beta blocker date and time   History of Anesthesia Complications Negative for: history of anesthetic complications  Airway Mallampati: III  TM Distance: >3 FB     Dental no notable dental hx.    Pulmonary shortness of breath and with exertion, asthma , pneumonia, former smoker   breath sounds clear to auscultation       Cardiovascular hypertension, +CHF  + dysrhythmias + Valvular Problems/Murmurs  Rhythm:Regular Rate:Normal  IMPRESSIONS     1. Left ventricular ejection fraction, by estimation, is 30 to 35%. The  left ventricle has moderately decreased function. The left ventricle  demonstrates global hypokinesis. Left ventricular diastolic parameters are  indeterminate.   2. Right ventricular systolic function is normal. The right ventricular  size is normal. There is normal pulmonary artery systolic pressure. The  estimated right ventricular systolic pressure is 24.9 mmHg.   3. The mitral valve is normal in structure. Trivial mitral valve  regurgitation. No evidence of mitral stenosis.   4. The aortic valve is tricuspid. Aortic valve regurgitation is not  visualized. No aortic stenosis is present.   5. The inferior vena cava is normal in size with greater than 50%  respiratory variability, suggesting right atrial pressure of 3 mmHg.     Neuro/Psych  Headaches, neg Seizures PSYCHIATRIC DISORDERS Anxiety Depression     Neuromuscular disease    GI/Hepatic hiatal hernia,GERD  ,,(+) neg Cirrhosis        Endo/Other    Renal/GU Renal disease     Musculoskeletal  (+) Arthritis ,  Fibromyalgia -  Abdominal   Peds  Hematology   Anesthesia Other Findings   Reproductive/Obstetrics                               Anesthesia Physical Anesthesia Plan  ASA: 3  Anesthesia Plan: MAC   Post-op Pain Management:    Induction: Intravenous  PONV Risk Score and Plan: 2 and Ondansetron , Dexamethasone  and Propofol  infusion  Airway Management Planned: Natural Airway and Nasal Cannula  Additional Equipment:   Intra-op Plan:   Post-operative Plan:   Informed Consent: I have reviewed the patients History and Physical, chart, labs and discussed the procedure including the risks, benefits and alternatives for the proposed anesthesia with the patient or authorized representative who has indicated his/her understanding and acceptance.     Dental advisory given  Plan Discussed with: CRNA  Anesthesia Plan Comments:          Anesthesia Quick Evaluation

## 2024-06-06 ENCOUNTER — Encounter (HOSPITAL_COMMUNITY): Payer: Self-pay | Admitting: Gastroenterology

## 2024-06-08 ENCOUNTER — Telehealth: Payer: Self-pay | Admitting: *Deleted

## 2024-06-08 NOTE — Telephone Encounter (Signed)
 As per 06/02/24 endoscopy procedure report recommendations, a recall hospital endoscopy with dilation reminder (EF <30%)  has been placed in epic to be completed around 11/2024.

## 2024-09-16 ENCOUNTER — Ambulatory Visit

## 2024-09-16 NOTE — Progress Notes (Deleted)
 New patient visit   Patient: Raven [name]   DOB: 1981-10-09   43 y.o. Female  MRN: [id number] Visit Date: 09/16/2024  Today's healthcare provider: Isaiah DELENA Pepper, MD   No chief complaint on file.  Subjective    Raven [name] is a 43 y.o. female who presents today as a new patient to establish care.   Esophageal stenosis, recurrent dysphagia- last EGD done in June 2025. Needs repeat in December.  HFrEF- last EF of 30-35%. Takes losartan , metoprolol . Cannot take entresto bc cannot be crushed.  HTN- takes metoprolol , losartan   Supposed to see advanced HF clinic.   Past Medical History:  Diagnosis Date   Abdominal pain, left lower quadrant 03/27/2014   Abnormal CXR 05/03/2019   05/03/2019   Walked RA  2 laps @  approx 233ft each @ fast pace  stopped due to  End of study with sats 95% at end     Abnormal Pap smear    Achalasia    esphog stretched several times   Acute pain of both knees 09/07/2018   Allergy    seasonal allergies   Anxiety    on meds   Arthritis    generalized-on meds   Asthma    rarely uses inhaler-PRN-stopped smoking-symptoms decreased   ASTHMA 10/09/2008   Spirometry wnl  08/08/14 with active symptoms attributed to VCD     Blood transfusion 01/2006   [facility]   Cardiomyopathy [facility])    Cardiomyopathy, idiopathic (HCC) 01/09/2014   CHF (congestive heart failure) (HCC) 06/2020   on meds   Chronic bilateral low back pain 09/07/2018   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 01/18/2018   Chronically on opiate therapy 01/18/2018   Cigarette smoker 08/09/2014   Constipation due to opioid therapy    Depression    DEPRESSION 10/09/2008   Qualifier: Diagnosis of  By: Winona Raring  -on meds   Dysphagia 08/10/2009   Qualifier: Diagnosis of  By: Ever Riggers, Amy S    Dyspnea 08/08/2014   Followed in Pulmonary clinic/ [facility]/ [place]  - 08/08/2014  Walked RA x 3 laps @ 185 ft each stopped due to  End of study, no sob or desat -  Spirometry 08/08/14 wnl     Dysrhythmia    Esophageal reflux 11/30/2008   Centricity Description: ESOPHAGEAL REFLUX Qualifier: Diagnosis of  By: Debrah MD, Lamar BIRCH  Centricity Description: GERD Qualifier: Diagnosis of  By: Ever Riggers, Amy S    ESOPHAGEAL STRICTURE 08/10/2009   Qualifier: Diagnosis of  By: Ever Riggers, Amy S    Excessive daytime sleepiness 01/18/2018   Fibromyalgia    on meds   Gallstones and inflammation of gallbladder without obstruction 01/23/2015   GERD (gastroesophageal reflux disease)    on meds - diet controlled   Globus sensation    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   Heart murmur    Heart palpitations    tx with atenolol  by steven klein    Hiatal hernia    History of abnormal Pap smear    History of chlamydia 1990's   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   HSV-2 infection    HTN (hypertension)    on meds   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   Occipital neuralgia of right side 04/21/2017   Palpitation 04/11/2011   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   Pneumonia 05/2013   PVC's (premature ventricular contractions)    S/P laparoscopic surgery 01/23/2015   Shortness of breath dyspnea  Sleep disorder, circadian, 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   Status post dilation of esophageal narrowing    Unspecified constipation 11/30/2008   Centricity Description: UNSPECIFIED CONSTIPATION Qualifier: Diagnosis of  By: Debrah MD, Lamar BIRCH  Centricity Description: CONSTIPATION Qualifier: Diagnosis of  By: Ever Riggers, Amy S    Upper airway cough syndrome 09/05/2014   Followed in Pulmonary clinic/ [facility]/ [place]  Onset  2002 p tonsilectomy  -Spirometry wnl  08/08/14  - referred to [facility] 09/05/2014 > inconclusive w/u by ENT / GI s f/u ov by either since 2016 > referred  Back to Dr Brien 10/20/2017  - Dg Es 01/27/2019  Spontaneous gastroesophageal reflux. 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   Past Surgical History:  Procedure Laterality Date   ADENOIDECTOMY  12/2002   BIOPSY  06/15/2023   Procedure: BIOPSY;  Surgeon: Leigh Elspeth SQUIBB, MD;  Location: [facility];  Service: Gastroenterology;;   BLADDER SUSPENSION  06/11/2012   Procedure: TRANSVAGINAL TAPE (TVT) PROCEDURE;  Surgeon: Jon CINDERELLA Rummer, MD;  Location: [facility];  Service: Gynecology;  Laterality: N/A;  Tension Free Vaginal Tape/cystoscopy   BREAST SURGERY  07/2001   tumor removed right breast   CHOLECYSTECTOMY N/A 01/23/2015   Procedure: LAPAROSCOPIC CHOLECYSTECTOMY ;  Surgeon: Elon Pacini, MD;  Location: [facility];  Service: General;  Laterality: N/A;  case booked with cholangiogram but MD did not complete intraoperative cholangiogram    COLONOSCOPY  2020   1 polyp/TICS   CYSTOSCOPY  06/11/2012   Procedure: CYSTOSCOPY;  Surgeon: Jon CINDERELLA Rummer, MD;  Location: [facility];  Service: Gynecology;  Laterality: N/A;   DILATION AND CURETTAGE OF UTERUS  1999   DILATION AND CURETTAGE OF UTERUS  01/2006   retained products   ESOPHAGEAL DILATION  01/30/2022   Procedure: ESOPHAGEAL DILATION;  Surgeon: Shila Gustav GAILS, MD;  Location: [facility];  Service: Endoscopy;;   ESOPHAGEAL MANOMETRY N/A 02/23/2019   Procedure: ESOPHAGEAL MANOMETRY (EM);  Surgeon: Shila Gustav GAILS, MD;  Location: [facility];  Service: Endoscopy;  Laterality: N/A;   ESOPHAGOGASTRODUODENOSCOPY     with esophageal dilation x 3   ESOPHAGOGASTRODUODENOSCOPY N/A 04/07/2024   Procedure: EGD (ESOPHAGOGASTRODUODENOSCOPY);  Surgeon: Charlanne Groom, MD;  Location: [facility];  Service: Gastroenterology;  Laterality: N/A;   ESOPHAGOGASTRODUODENOSCOPY N/A 06/02/2024   Procedure: EGD (ESOPHAGOGASTRODUODENOSCOPY);   Surgeon: Leigh Elspeth SQUIBB, MD;  Location: [facility];  Service: Gastroenterology;  Laterality: N/A;   ESOPHAGOGASTRODUODENOSCOPY (EGD) WITH PROPOFOL  N/A 09/25/2020   Procedure: ESOPHAGOGASTRODUODENOSCOPY (EGD) WITH PROPOFOL ;  Surgeon: Leigh Elspeth SQUIBB, MD;  Location: [facility];  Service: Gastroenterology;  Laterality: N/A;   ESOPHAGOGASTRODUODENOSCOPY (EGD) WITH PROPOFOL  N/A 01/30/2022   Procedure: ESOPHAGOGASTRODUODENOSCOPY (EGD) WITH PROPOFOL ;  Surgeon: Shila Gustav GAILS, MD;  Location: [facility];  Service: Endoscopy;  Laterality: N/A;   ESOPHAGOGASTRODUODENOSCOPY (EGD) WITH PROPOFOL  N/A 06/15/2023   Procedure: ESOPHAGOGASTRODUODENOSCOPY (EGD) WITH PROPOFOL ;  Surgeon: Leigh Elspeth SQUIBB, MD;  Location: [facility];  Service: Gastroenterology;  Laterality: N/A;   FOREIGN BODY REMOVAL  06/11/2012   Procedure: REMOVAL FOREIGN BODY EXTREMITY;  Surgeon: Jon CINDERELLA Rummer, MD;  Location: [facility];  Service: Gynecology;  Laterality:  Right;  Removal of Implanon    LAPAROSCOPIC TUBAL LIGATION  06/11/2012   Procedure: LAPAROSCOPIC TUBAL LIGATION;  Surgeon: Jon CINDERELLA Rummer, MD;  Location: [facility];  Service: Gynecology;  Laterality: Bilateral;  Laparoscopic Bilateral Fulgeration   POLYPECTOMY  2020   tics/1 polyp   SAVORY DILATION N/A 09/25/2020   Procedure: SAVORY DILATION;  Surgeon: Leigh Elspeth SQUIBB, MD;  Location: [facility];  Service: Gastroenterology;  Laterality: N/A;   SAVORY DILATION N/A 06/15/2023   Procedure: SAVORY DILATION;  Surgeon: Leigh Elspeth SQUIBB, MD;  Location: [facility];  Service: Gastroenterology;  Laterality: N/A;   TONSILLECTOMY     Family Status  Relation Name Status   Father  Alive   Mother  Alive   Sister  (Not Specified)   Sister  (Not Specified)   Pat Uncle  Deceased   Neg Hx  (Not Specified)  No partnership data on file   Family History  Problem Relation Age of Onset   Heart disease Father    Hypertension Father    Colon polyps Father 45    Stroke Mother    Hypertension Mother    Heart attack Mother    Breast cancer Mother    Neuropathy Mother    Arthritis Mother    Asthma Mother    Peripheral Artery Disease Mother 55       had her leg amputated 2020   Neuropathy Sister    Arthritis Sister    Asthma Sister    Stomach cancer Paternal Uncle 63   Colon cancer Paternal Uncle 79   Colon polyps Paternal Uncle 74   Esophageal cancer Neg Hx    Rectal cancer Neg Hx    Social History   Socioeconomic History   Marital status: Single    Spouse name: Not on file   Number of children: 2   Years of education: College   Highest education level: Not on file  Occupational History   Not on file  Tobacco Use   Smoking status: Former    Current packs/day: 0.00    Average packs/day: 0.3 packs/day for 15.0 years (3.8 ttl pk-yrs)    Types: Cigarettes    Start date: 04/28/2004    Quit date: 04/29/2019    Years since quitting: 5.3   Smokeless tobacco: Never   Tobacco comments:    stopped smoking 04-29-2019  Vaping Use   Vaping status: Never Used  Substance and Sexual Activity   Alcohol use: No    Comment: special occassions   Drug use: No   Sexual activity: Yes    Birth control/protection: Surgical  Other Topics Concern   Not on file  Social History Narrative   Lives with fiance and children   Caffeine use:  Hot ginger tea- daily   Left-handed   Social Drivers of Corporate investment banker Strain: Not on file  Food Insecurity: Not on file  Transportation Needs: Not on file  Physical Activity: Not on file  Stress: Not on file  Social Connections: Not on file   Outpatient Medications Prior to Visit  Medication Sig   ciprofloxacin  (CIPRO ) 250 MG tablet Take 250 mg by mouth 2 (two) times daily.   cyclobenzaprine (FLEXERIL) 10 MG tablet Take 10 mg by mouth 3 (three) times daily.   glipiZIDE 2.5 MG TABS Take 1 tablet by mouth every morning.   linaclotide  (LINZESS ) 290 MCG CAPS capsule Take 1 capsule (290 mcg total) by  mouth daily before breakfast.   LORazepam  (ATIVAN ) 2  MG tablet Take 2 mg by mouth 3 (three) times daily.   losartan  (COZAAR ) 25 MG tablet Take 1 tablet (25 mg total) by mouth daily.   metoprolol  tartrate (LOPRESSOR ) 25 MG tablet Take 0.5 tablets (12.5 mg total) by mouth 2 (two) times daily. (Patient not taking: Reported on 05/17/2024)   oxyCODONE -acetaminophen  (PERCOCET) 10-325 MG tablet Take 1 tablet by mouth 4 (four) times daily.   phentermine (ADIPEX-P) 37.5 MG tablet Take 37.5 mg by mouth daily.   sucralfate  (CARAFATE ) 1 GM/10ML suspension Take 10 mLs (1 g total) by mouth 3 (three) times daily as needed.   sucralfate  (CARAFATE ) 1 GM/10ML suspension Take 10 mLs (1 g total) by mouth every 8 (eight) hours as needed.   valACYclovir  (VALTREX ) 500 MG tablet Take 500 mg by mouth daily.   No facility-administered medications prior to visit.   Allergies  Allergen Reactions   Codeine Rash, Shortness Of Breath and Anaphylaxis   Dust Mite Extract Shortness Of Breath   Other Shortness Of Breath    Feathers, trees almost everything outside   Pollen Extract Shortness Of Breath   Promethazine  Hives and Shortness Of Breath    Reviews of Systems as noted in HPI.  {Insert previous labs (optional):23779} {See past labs  Heme  Chem  Endocrine  Serology  Results Review (optional):1}   Objective    There were no vitals taken for this visit. {Insert last BP/Wt (optional):23777}{See vitals history (optional):1}   Physical Exam  Depression Screen     No data to display         No results found for any visits on 09/16/24.  Assessment & Plan      Problem List Items Addressed This Visit   None   Assessment and Plan              No follow-ups on file.      Isaiah DELENA Pepper, MD  [facility] [phone] (phone) [phone] (fax)

## 2024-09-29 ENCOUNTER — Encounter: Payer: Self-pay | Admitting: Obstetrics and Gynecology

## 2024-09-29 ENCOUNTER — Ambulatory Visit (INDEPENDENT_AMBULATORY_CARE_PROVIDER_SITE_OTHER): Admitting: Obstetrics and Gynecology

## 2024-09-29 ENCOUNTER — Other Ambulatory Visit (HOSPITAL_COMMUNITY)
Admission: RE | Admit: 2024-09-29 | Discharge: 2024-09-29 | Disposition: A | Source: Ambulatory Visit | Attending: Obstetrics and Gynecology | Admitting: Obstetrics and Gynecology

## 2024-09-29 ENCOUNTER — Other Ambulatory Visit (HOSPITAL_COMMUNITY)
Admission: RE | Admit: 2024-09-29 | Discharge: 2024-09-29 | Disposition: A | Discharge status: Home / Self Care | Source: Ambulatory Visit | Attending: Obstetrics and Gynecology | Admitting: Obstetrics and Gynecology

## 2024-09-29 VITALS — BP 119/80 | HR 90 | Ht 69.0 in | Wt 180.0 lb

## 2024-09-29 DIAGNOSIS — N946 Dysmenorrhea, unspecified: Secondary | ICD-10-CM

## 2024-09-29 DIAGNOSIS — Z113 Encounter for screening for infections with a predominantly sexual mode of transmission: Secondary | ICD-10-CM | POA: Diagnosis not present

## 2024-09-29 DIAGNOSIS — Z1151 Encounter for screening for human papillomavirus (HPV): Secondary | ICD-10-CM | POA: Insufficient documentation

## 2024-09-29 DIAGNOSIS — Z01419 Encounter for gynecological examination (general) (routine) without abnormal findings: Secondary | ICD-10-CM | POA: Insufficient documentation

## 2024-09-29 DIAGNOSIS — N898 Other specified noninflammatory disorders of vagina: Secondary | ICD-10-CM | POA: Diagnosis present

## 2024-09-29 DIAGNOSIS — Z1231 Encounter for screening mammogram for malignant neoplasm of breast: Secondary | ICD-10-CM

## 2024-09-29 DIAGNOSIS — N841 Polyp of cervix uteri: Secondary | ICD-10-CM | POA: Insufficient documentation

## 2024-09-29 DIAGNOSIS — Z803 Family history of malignant neoplasm of breast: Secondary | ICD-10-CM

## 2024-09-29 DIAGNOSIS — Z124 Encounter for screening for malignant neoplasm of cervix: Secondary | ICD-10-CM

## 2024-09-29 NOTE — Progress Notes (Signed)
 Pt is new to office, here for routine exam.  Pt states her cycles have gotten more painful, lasting 7 days.  Pt does take ibuprofen  and use heating pad for pain.  Pt would like all testing today.

## 2024-09-29 NOTE — Progress Notes (Signed)
 NEW GYNECOLOGY VISIT Chief Complaint  Patient presents with   New Patient (Initial Visit)     Subjective:  Raven [name] is a 43 y.o. [id number] who presents for new visit.  Reports hasn't had a gyn exam in a long time Reports painful periods, not heavy. Uses ibuprofen  prn. Has hx gastritis and esophageal strictures/achalasia    Gyn History: Patient's last menstrual period was 09/15/2024. Sexually active: yes/no: Yes Contraception: bilateral tubal ligation History of STIs: Yes: hx PID Last pap: No results found for: DIAGPAP, HPV, HPVHIGH History of abnormal pap: yes, colpo years ago, denies hx leep/cone Periods: regular, not heavy, but painful     09/29/2024    8:32 AM  Depression screen PHQ 2/9  Decreased Interest 0  Down, Depressed, Hopeless 0  PHQ - 2 Score 0  Altered sleeping 0  Tired, decreased energy 0  Change in appetite 1  Feeling bad or failure about yourself  0  Trouble concentrating 0  Moving slowly or fidgety/restless 0  Suicidal thoughts 0  PHQ-9 Score 1      09/29/2024    8:32 AM  GAD 7 : Generalized Anxiety Score  Nervous, Anxious, on Edge 0  Worry too much - different things 0  Trouble relaxing 0  Restless 0  Easily annoyed or irritable 0  Afraid - awful might happen 0     OB History     Gravida  4   Para  2   Term  2   Preterm  0   AB  2   Living  2      SAB  2   IAB      Ectopic      Multiple      Live Births              Past Medical History:  Diagnosis Date   Abdominal pain, left lower quadrant 03/27/2014   Abnormal CXR 05/03/2019   05/03/2019   Walked RA  2 laps @  approx 237ft each @ fast pace  stopped due to  End of study with sats 95% at end     Abnormal Pap smear    Achalasia    esphog stretched several times   Acute pain of both knees 09/07/2018   Allergy    seasonal allergies   Anxiety    on meds   Arthritis    generalized-on meds   Asthma    rarely uses inhaler-PRN-stopped  smoking-symptoms decreased   ASTHMA 10/09/2008   Spirometry wnl  08/08/14 with active symptoms attributed to VCD     Blood transfusion 01/2006   [facility]   Cardiomyopathy [facility])    Cardiomyopathy, idiopathic (HCC) 01/09/2014   CHF (congestive heart failure) (HCC) 06/2020   on meds   Chronic bilateral low back pain 09/07/2018   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 01/18/2018   Chronically on opiate therapy 01/18/2018   Cigarette smoker 08/09/2014   Constipation due to opioid therapy    Depression    DEPRESSION 10/09/2008   Qualifier: Diagnosis of  By: Winona Raring  -on meds   Dysphagia 08/10/2009   Qualifier: Diagnosis of  By: Ever Riggers, Amy S    Dyspnea 08/08/2014   Followed in Pulmonary clinic/ [facility]/ [place]  - 08/08/2014  Walked RA x 3 laps @ 185 ft each stopped due to  End of study, no sob or desat - Spirometry 08/08/14 wnl     Dysrhythmia    Esophageal reflux 11/30/2008  Centricity Description: ESOPHAGEAL REFLUX Qualifier: Diagnosis of  By: Debrah MD, Lamar BIRCH  Centricity Description: GERD Qualifier: Diagnosis of  By: Ever Riggers, Amy S    ESOPHAGEAL STRICTURE 08/10/2009   Qualifier: Diagnosis of  By: Ever Riggers, Amy S    Excessive daytime sleepiness 01/18/2018   Fibromyalgia    on meds   Gallstones and inflammation of gallbladder without obstruction 01/23/2015   GERD (gastroesophageal reflux disease)    on meds - diet controlled   Globus sensation    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   Heart murmur    Heart palpitations    tx with atenolol  by steven klein    Hiatal hernia    History of abnormal Pap smear    History of chlamydia 1990's   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   HSV-2 infection    HTN (hypertension)    on meds   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   Occipital neuralgia of right side 04/21/2017   Palpitation 04/11/2011   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   Pneumonia 05/2013   PVC's (premature ventricular  contractions)    S/P laparoscopic surgery 01/23/2015   Shortness of breath dyspnea    Sleep disorder, circadian, 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   Status post dilation of esophageal narrowing    Unspecified constipation 11/30/2008   Centricity Description: UNSPECIFIED CONSTIPATION Qualifier: Diagnosis of  By: Debrah MD, Lamar BIRCH  Centricity Description: CONSTIPATION Qualifier: Diagnosis of  By: Ever Riggers, Amy S    Upper airway cough syndrome 09/05/2014   Followed in Pulmonary clinic/ [facility]/ [place]  Onset  2002 p tonsilectomy  -Spirometry wnl  08/08/14  - referred to [facility] 09/05/2014 > inconclusive w/u by ENT / GI s f/u ov by either since 2016 > referred  Back to Dr Brien 10/20/2017  - Dg Es 01/27/2019  Spontaneous gastroesophageal reflux. 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    Past Surgical History:  Procedure Laterality Date   ADENOIDECTOMY  12/2002   BIOPSY  06/15/2023   Procedure: BIOPSY;  Surgeon: Leigh Elspeth SQUIBB, MD;  Location: [facility];  Service: Gastroenterology;;   BLADDER SUSPENSION  06/11/2012   Procedure: TRANSVAGINAL TAPE (TVT) PROCEDURE;  Surgeon: Jon CINDERELLA Rummer, MD;  Location: [facility];  Service: Gynecology;  Laterality: N/A;  Tension Free Vaginal Tape/cystoscopy   BREAST SURGERY  07/2001   tumor removed right breast   CHOLECYSTECTOMY N/A 01/23/2015   Procedure: LAPAROSCOPIC CHOLECYSTECTOMY ;  Surgeon: Elon Pacini, MD;  Location: [facility];  Service: General;  Laterality: N/A;  case booked with cholangiogram but MD did not complete intraoperative cholangiogram    COLONOSCOPY  2020   1 polyp/TICS   CYSTOSCOPY  06/11/2012   Procedure: CYSTOSCOPY;   Surgeon: Jon CINDERELLA Rummer, MD;  Location: [facility];  Service: Gynecology;  Laterality: N/A;   DILATION AND CURETTAGE OF UTERUS  1999   DILATION AND CURETTAGE OF UTERUS  01/2006   retained products   ESOPHAGEAL DILATION  01/30/2022   Procedure: ESOPHAGEAL DILATION;  Surgeon: Shila Gustav GAILS, MD;  Location: [facility];  Service: Endoscopy;;   ESOPHAGEAL MANOMETRY N/A 02/23/2019  Procedure: ESOPHAGEAL MANOMETRY (EM);  Surgeon: Shila Gustav GAILS, MD;  Location: [facility];  Service: Endoscopy;  Laterality: N/A;   ESOPHAGOGASTRODUODENOSCOPY     with esophageal dilation x 3   ESOPHAGOGASTRODUODENOSCOPY N/A 04/07/2024   Procedure: EGD (ESOPHAGOGASTRODUODENOSCOPY);  Surgeon: Charlanne Groom, MD;  Location: [facility];  Service: Gastroenterology;  Laterality: N/A;   ESOPHAGOGASTRODUODENOSCOPY N/A 06/02/2024   Procedure: EGD (ESOPHAGOGASTRODUODENOSCOPY);  Surgeon: Leigh Elspeth SQUIBB, MD;  Location: [facility];  Service: Gastroenterology;  Laterality: N/A;   ESOPHAGOGASTRODUODENOSCOPY (EGD) WITH PROPOFOL  N/A 09/25/2020   Procedure: ESOPHAGOGASTRODUODENOSCOPY (EGD) WITH PROPOFOL ;  Surgeon: Leigh Elspeth SQUIBB, MD;  Location: [facility];  Service: Gastroenterology;  Laterality: N/A;   ESOPHAGOGASTRODUODENOSCOPY (EGD) WITH PROPOFOL  N/A 01/30/2022   Procedure: ESOPHAGOGASTRODUODENOSCOPY (EGD) WITH PROPOFOL ;  Surgeon: Shila Gustav GAILS, MD;  Location: [facility];  Service: Endoscopy;  Laterality: N/A;   ESOPHAGOGASTRODUODENOSCOPY (EGD) WITH PROPOFOL  N/A 06/15/2023   Procedure: ESOPHAGOGASTRODUODENOSCOPY (EGD) WITH PROPOFOL ;  Surgeon: Leigh Elspeth SQUIBB, MD;  Location: [facility];  Service: Gastroenterology;  Laterality: N/A;   FOREIGN BODY REMOVAL  06/11/2012   Procedure: REMOVAL FOREIGN BODY EXTREMITY;  Surgeon: Jon CINDERELLA Rummer, MD;  Location: [facility];  Service: Gynecology;  Laterality: Right;  Removal of Implanon    LAPAROSCOPIC TUBAL LIGATION  06/11/2012   Procedure: LAPAROSCOPIC TUBAL LIGATION;   Surgeon: Jon CINDERELLA Rummer, MD;  Location: [facility];  Service: Gynecology;  Laterality: Bilateral;  Laparoscopic Bilateral Fulgeration   POLYPECTOMY  2020   tics/1 polyp   SAVORY DILATION N/A 09/25/2020   Procedure: SAVORY DILATION;  Surgeon: Leigh Elspeth SQUIBB, MD;  Location: [facility];  Service: Gastroenterology;  Laterality: N/A;   SAVORY DILATION N/A 06/15/2023   Procedure: SAVORY DILATION;  Surgeon: Leigh Elspeth SQUIBB, MD;  Location: [facility];  Service: Gastroenterology;  Laterality: N/A;   TONSILLECTOMY      Social History   Socioeconomic History   Marital status: Single    Spouse name: Not on file   Number of children: 2   Years of education: College   Highest education level: Not on file  Occupational History   Not on file  Tobacco Use   Smoking status: Former    Current packs/day: 0.00    Average packs/day: 0.3 packs/day for 15.0 years (3.8 ttl pk-yrs)    Types: Cigarettes    Start date: 04/28/2004    Quit date: 04/29/2019    Years since quitting: 5.4   Smokeless tobacco: Never   Tobacco comments:    stopped smoking 04-29-2019  Vaping Use   Vaping status: Never Used  Substance and Sexual Activity   Alcohol use: No    Comment: special occassions   Drug use: No   Sexual activity: Yes    Birth control/protection: Surgical  Other Topics Concern   Not on file  Social History Narrative   Lives with fiance and children   Caffeine use:  Hot ginger tea- daily   Left-handed   Social Drivers of Corporate investment banker Strain: Not on file  Food Insecurity: Not on file  Transportation Needs: Not on file  Physical Activity: Not on file  Stress: Not on file  Social Connections: Not on file    Family History  Problem Relation Age of Onset   Heart disease Father    Hypertension Father    Colon polyps Father 71   Stroke Mother    Hypertension Mother    Heart attack Mother    Breast cancer Mother    Neuropathy Mother  Arthritis Mother    Asthma Mother     Peripheral Artery Disease Mother 20       had her leg amputated 2020   Neuropathy Sister    Arthritis Sister    Asthma Sister    Stomach cancer Paternal Uncle 75   Colon cancer Paternal Uncle 43   Colon polyps Paternal Uncle 52   Esophageal cancer Neg Hx    Rectal cancer Neg Hx     Current Outpatient Medications on File Prior to Visit  Medication Sig Dispense Refill   cyclobenzaprine (FLEXERIL) 10 MG tablet Take 10 mg by mouth 3 (three) times daily.     glipiZIDE 2.5 MG TABS Take 1 tablet by mouth every morning.     linaclotide  (LINZESS ) 290 MCG CAPS capsule Take 1 capsule (290 mcg total) by mouth daily before breakfast. 90 capsule 1   LORazepam  (ATIVAN ) 2 MG tablet Take 2 mg by mouth 3 (three) times daily.     losartan  (COZAAR ) 25 MG tablet Take 1 tablet (25 mg total) by mouth daily. 90 tablet 3   metoprolol  tartrate (LOPRESSOR ) 25 MG tablet Take 0.5 tablets (12.5 mg total) by mouth 2 (two) times daily. 90 tablet 1   oxyCODONE -acetaminophen  (PERCOCET) 10-325 MG tablet Take 1 tablet by mouth 4 (four) times daily. 120 tablet 0   sucralfate  (CARAFATE ) 1 GM/10ML suspension Take 10 mLs (1 g total) by mouth every 8 (eight) hours as needed. 420 mL 0   valACYclovir  (VALTREX ) 500 MG tablet Take 500 mg by mouth daily.     ciprofloxacin  (CIPRO ) 250 MG tablet Take 250 mg by mouth 2 (two) times daily.     phentermine (ADIPEX-P) 37.5 MG tablet Take 37.5 mg by mouth daily.     sucralfate  (CARAFATE ) 1 GM/10ML suspension Take 10 mLs (1 g total) by mouth 3 (three) times daily as needed. 420 mL 0   No current facility-administered medications on file prior to visit.    Allergies  Allergen Reactions   Codeine Rash, Shortness Of Breath and Anaphylaxis   Dust Mite Extract Shortness Of Breath   Other Shortness Of Breath    Feathers, trees almost everything outside   Pollen Extract Shortness Of Breath   Promethazine  Hives and Shortness Of Breath     Objective:   Vitals:   09/29/24 0820   BP: 119/80  Pulse: 90  Weight: 180 lb (81.6 kg)  Height: 5' 9 (1.753 m)   Physical Examination:   General appearance - well appearing, and in no distress  Mental status - alert, oriented to person, place, and time  Psych:  normal mood and affect  Skin - warm and dry, normal color, no suspicious lesions noted  Breasts - breasts appear normal, no suspicious masses, no skin or nipple changes or axillary nodes  Abdomen - soft, nontender, nondistended, no masses or organomegaly  Pelvic -  VULVA: normal appearing vulva with no masses, tenderness or lesions   VAGINA:hyperpigmentation of anterior vagina CERVIX: normal appearing cervix, +small polyp  Thin prep pap is done with HR HPV cotesting  UTERUS: uterus is felt to be normal size, shape, consistency and nontender   ADNEXA: No adnexal masses or tenderness noted.  Extremities:  No swelling or varicosities noted  Chaperone present for exam  PROCEDURE NOTE Cervical polyp removal The indications for cervical polyp removal were reviewed.   Risks of the biopsy including pain, bleeding, infection discussed. The patient stated understanding and agreed to undergo procedure today. Verbal consent obtained during  exam. Consent signed after procedure. The cervix and vagina were prepped with betadine x 3. Polyp was grasped with ring forceps, twisted and removed without issue. No significant bleeding noted. Patient tolerated well.  Vaginal biopsy The indications for vaginal biopsy for hyperpigmentation were reviewed.   Risks of the biopsy including pain, bleeding, infection discussed The patient stated understanding and agreed to undergo procedure today. Verbal consent obtained during exam. Consent signed after procedure. The vagina had already been prepped with betadine. A small superficial biopsy was obtained of the anterior vagina. Silver nitrate applied. No significant bleeding noted. Patient tolerated the procedure well.   Assessment and Plan:   1. Well woman exam with routine gynecological exam (Primary) Pap/HPV Mammo ordered STI testing Tubal for contraception  2. Cervical cancer screening - Cytology - PAP( [place])  3. Encounter for screening mammogram for malignant neoplasm of breast - MM 3D SCREENING MAMMOGRAM BILATERAL BREAST; Future  4. Routine screening for STI (sexually transmitted infection) - Cervicovaginal ancillary only( [place]) - HIV antibody (with reflex) - Hepatitis C Antibody - Hepatitis B Surface AntiGEN - [facility]  5. Vaginal lesion Unclear etiology, global pigmentation anteriorly without discrete lesion, biopsy performed - Surgical pathology  6. Cervical polyp Benign appearing - Surgical pathology  7. Dysmenorrhea Counseled on NSAID use in setting of gastritis. Pt also with history of HF. Reviewed options but she declines  8. Family history of breast cancer Mother diagnosed in her 30s, unable to get genetic testing due to home bound/medical problems. Patient interested in genetic testing, referral placed.  Rollo ONEIDA Bring, MD, FACOG Obstetrician & Gynecologist, [facility] for [facility], [facility]

## 2024-09-30 LAB — CERVICOVAGINAL ANCILLARY ONLY
Chlamydia: NEGATIVE
Comment: NEGATIVE
Comment: NEGATIVE
Comment: NORMAL
Neisseria Gonorrhea: NEGATIVE
Trichomonas: NEGATIVE

## 2024-09-30 LAB — HEPATITIS B SURFACE ANTIGEN: Hepatitis B Surface Ag: NEGATIVE

## 2024-09-30 LAB — HIV ANTIBODY (ROUTINE TESTING W REFLEX): HIV Screen 4th Generation wRfx: NONREACTIVE

## 2024-09-30 LAB — RPR: RPR Ser Ql: NONREACTIVE

## 2024-09-30 LAB — HEPATITIS C ANTIBODY: Hep C Virus Ab: NONREACTIVE

## 2024-10-01 ENCOUNTER — Ambulatory Visit: Payer: Self-pay | Admitting: Obstetrics and Gynecology

## 2024-10-01 DIAGNOSIS — L814 Other melanin hyperpigmentation: Secondary | ICD-10-CM

## 2024-10-03 LAB — SURGICAL PATHOLOGY

## 2024-10-05 DIAGNOSIS — L814 Other melanin hyperpigmentation: Secondary | ICD-10-CM | POA: Insufficient documentation

## 2024-10-10 ENCOUNTER — Ambulatory Visit

## 2024-10-11 LAB — CYTOLOGY - PAP
Comment: NEGATIVE
Comment: NEGATIVE
HPV 16: NEGATIVE
HPV 18 / 45: NEGATIVE
High risk HPV: POSITIVE — AB

## 2024-11-29 ENCOUNTER — Encounter: Admitting: Obstetrics and Gynecology

## 2024-12-12 ENCOUNTER — Inpatient Hospital Stay: Attending: Internal Medicine | Admitting: Genetic Counselor

## 2024-12-12 ENCOUNTER — Encounter: Payer: Self-pay | Admitting: Genetic Counselor

## 2024-12-12 ENCOUNTER — Inpatient Hospital Stay

## 2024-12-12 DIAGNOSIS — Z803 Family history of malignant neoplasm of breast: Secondary | ICD-10-CM

## 2024-12-12 DIAGNOSIS — Z8042 Family history of malignant neoplasm of prostate: Secondary | ICD-10-CM

## 2024-12-12 DIAGNOSIS — Z800 Family history of malignant neoplasm of digestive organs: Secondary | ICD-10-CM

## 2024-12-12 LAB — GENETIC SCREENING ORDER

## 2024-12-12 NOTE — Progress Notes (Signed)
 REFERRING PROVIDER: Abigail Rollo DASEN, MD [address] [name],  [place] [address]  PRIMARY PROVIDER:  Ilah Crigler, MD  PRIMARY REASON FOR VISIT:  1. Family history of malignant neoplasm of breast   2. Family history of malignant neoplasm of gastrointestinal tract   3. Family history of malignant neoplasm of prostate     HISTORY OF PRESENT ILLNESS:   Raven [name], a 43 y.o. female, was seen for a [facility] cancer genetics consultation at the request of Dunn, Rollo DASEN, MD due to a family history of cancer.  Raven [name] presents to clinic today to discuss the possibility of a hereditary predisposition to cancer, to discuss genetic testing, and to further clarify her future cancer risks, as well as potential cancer risks for family members.   Raven [name] is a 43 y.o. female with no personal history of cancer. She does report a personal history of cardiomyopathy diagnosed in 2016 and congestive heart failure diagnosed around 2021.     RELEVANT MEDICAL HISTORY:  Menarche was at age 33.  First live birth at age 33.  OCP use for approximately 0 years.  Ovaries intact: yes.  Uterus intact: yes.  Menopausal status: premenopausal.  HRT use: 0 years. Colonoscopy: yes; colonoscopy 2020, one polyp (adenoma). Colonoscopy 2015, two polyps (adenoma, hyperplastic) . EGD 2025, dysphagia, stricture, dilation.  Mammogram within the last year: no. Last mammogram 2023, density C.  Number of breast biopsies: 1. Reports lumpectomy around 2001, benign.  Up to date with pelvic exams: yes. Cervical polyp removed, at high risk for HPV.   Past Medical History:  Diagnosis Date   Abdominal pain, left lower quadrant 03/27/2014   Abnormal CXR 05/03/2019   05/03/2019   Walked RA  2 laps @  approx 268ft each @ fast pace  stopped due to  End of study with sats 95% at end     Abnormal Pap smear    Achalasia    esphog stretched several times   Acute pain of both knees 09/07/2018   Allergy    seasonal allergies    Anxiety    on meds   Arthritis    generalized-on meds   Asthma    rarely uses inhaler-PRN-stopped smoking-symptoms decreased   ASTHMA 10/09/2008   Spirometry wnl  08/08/14 with active symptoms attributed to VCD     Blood transfusion 01/2006   [facility]   Cardiomyopathy [facility])    Cardiomyopathy, idiopathic (HCC) 01/09/2014   CHF (congestive heart failure) (HCC) 06/2020   on meds   Chronic bilateral low back pain 09/07/2018   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 01/18/2018   Chronically on opiate therapy 01/18/2018   Cigarette smoker 08/09/2014   Constipation due to opioid therapy    Depression    DEPRESSION 10/09/2008   Qualifier: Diagnosis of  By: Winona Raring  -on meds   Dysphagia 08/10/2009   Qualifier: Diagnosis of  By: Ever Riggers, Amy S    Dyspnea 08/08/2014   Followed in Pulmonary clinic/ [facility]/ [facility]  - 08/08/2014  Walked RA x 3 laps @ 185 ft each stopped due to  End of study, no sob or desat - Spirometry 08/08/14 wnl     Dysrhythmia    Esophageal reflux 11/30/2008   Centricity Description: ESOPHAGEAL REFLUX Qualifier: Diagnosis of  By: Debrah MD, Lamar BIRCH  Centricity Description: GERD Qualifier: Diagnosis of  By: Ever Riggers, Amy S    ESOPHAGEAL STRICTURE 08/10/2009   Qualifier: Diagnosis of  By: Ever Riggers, Amy  S    Excessive daytime sleepiness 01/18/2018   Fibromyalgia    on meds   Gallstones and inflammation of gallbladder without obstruction 01/23/2015   GERD (gastroesophageal reflux disease)    on meds - diet controlled   Globus sensation    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   Heart murmur    Heart palpitations    tx with atenolol  by steven klein    Hiatal hernia    History of abnormal Pap smear    History of chlamydia 1990's   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   HSV-2 infection    HTN (hypertension)    on meds   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   Occipital neuralgia of right side 04/21/2017   Palpitation 04/11/2011    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   Pneumonia 05/2013   PVC's (premature ventricular contractions)    S/P laparoscopic surgery 01/23/2015   Shortness of breath dyspnea    Sleep disorder, circadian, 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   Status post dilation of esophageal narrowing    Unspecified constipation 11/30/2008   Centricity Description: UNSPECIFIED CONSTIPATION Qualifier: Diagnosis of  By: Debrah MD, Lamar BIRCH  Centricity Description: CONSTIPATION Qualifier: Diagnosis of  By: Ever Riggers, Amy S    Upper airway cough syndrome 09/05/2014   Followed in Pulmonary clinic/ [facility]/ [facility]  Onset  2002 p tonsilectomy  -Spirometry wnl  08/08/14  - referred to [facility] 09/05/2014 > inconclusive w/u by ENT / GI s f/u ov by either since 2016 > referred  Back to Dr Brien 10/20/2017  - Dg Es 01/27/2019  Spontaneous gastroesophageal reflux. 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    Past Surgical History:  Procedure Laterality Date   ADENOIDECTOMY  12/2002   BIOPSY  06/15/2023   Procedure: BIOPSY;  Surgeon: Leigh Elspeth SQUIBB, MD;  Location: [facility];  Service: Gastroenterology;;   BLADDER SUSPENSION  06/11/2012   Procedure: TRANSVAGINAL TAPE (TVT) PROCEDURE;  Surgeon: Jon CINDERELLA Rummer, MD;  Location: [facility];  Service: Gynecology;  Laterality: N/A;  Tension Free Vaginal Tape/cystoscopy   BREAST SURGERY  07/2001   tumor removed right breast   CHOLECYSTECTOMY N/A 01/23/2015   Procedure: LAPAROSCOPIC CHOLECYSTECTOMY ;  Surgeon: Elon Pacini, MD;  Location: [facility];  Service: General;  Laterality: N/A;  case booked with cholangiogram but MD did not complete intraoperative cholangiogram     COLONOSCOPY  2020   1 polyp/TICS   CYSTOSCOPY  06/11/2012   Procedure: CYSTOSCOPY;  Surgeon: Jon CINDERELLA Rummer, MD;  Location: [facility];  Service: Gynecology;  Laterality: N/A;   DILATION AND CURETTAGE OF UTERUS  1999   DILATION AND CURETTAGE OF UTERUS  01/2006   retained products   ESOPHAGEAL DILATION  01/30/2022   Procedure: ESOPHAGEAL DILATION;  Surgeon: Shila Gustav GAILS, MD;  Location: [facility];  Service: Endoscopy;;   ESOPHAGEAL MANOMETRY N/A 02/23/2019   Procedure: ESOPHAGEAL MANOMETRY (EM);  Surgeon: Shila Gustav GAILS, MD;  Location: [facility];  Service: Endoscopy;  Laterality: N/A;   ESOPHAGOGASTRODUODENOSCOPY     with esophageal dilation x 3   ESOPHAGOGASTRODUODENOSCOPY N/A 04/07/2024   Procedure: EGD (ESOPHAGOGASTRODUODENOSCOPY);  Surgeon: Charlanne Groom, MD;  Location: [facility];  Service: Gastroenterology;  Laterality: N/A;   ESOPHAGOGASTRODUODENOSCOPY N/A 06/02/2024   Procedure: EGD (ESOPHAGOGASTRODUODENOSCOPY);  Surgeon: Leigh Elspeth SQUIBB, MD;  Location: [facility];  Service: Gastroenterology;  Laterality: N/A;   ESOPHAGOGASTRODUODENOSCOPY (EGD) WITH PROPOFOL  N/A 09/25/2020   Procedure: ESOPHAGOGASTRODUODENOSCOPY (EGD) WITH PROPOFOL ;  Surgeon: Leigh Elspeth SQUIBB, MD;  [facility]: [facility];  Service: Gastroenterology;  Laterality: N/A;   ESOPHAGOGASTRODUODENOSCOPY (EGD) WITH PROPOFOL  N/A 01/30/2022   Procedure: ESOPHAGOGASTRODUODENOSCOPY (EGD) WITH PROPOFOL ;  Surgeon: Shila Gustav GAILS, MD;  [facility]: [facility];  Service: Endoscopy;  Laterality: N/A;   ESOPHAGOGASTRODUODENOSCOPY (EGD) WITH PROPOFOL  N/A 06/15/2023   Procedure: ESOPHAGOGASTRODUODENOSCOPY (EGD) WITH PROPOFOL ;  Surgeon: Leigh Elspeth SQUIBB, MD;  [facility]: [facility];  Service: Gastroenterology;  Laterality: N/A;   FOREIGN BODY REMOVAL  06/11/2012   Procedure: REMOVAL FOREIGN BODY EXTREMITY;  Surgeon: Jon CINDERELLA Rummer, MD;  Location: [facility];  Service: Gynecology;  Laterality: Right;  Removal of  Implanon    LAPAROSCOPIC TUBAL LIGATION  06/11/2012   Procedure: LAPAROSCOPIC TUBAL LIGATION;  Surgeon: Jon CINDERELLA Rummer, MD;  Location: [facility];  Service: Gynecology;  Laterality: Bilateral;  Laparoscopic Bilateral Fulgeration   POLYPECTOMY  2020   tics/1 polyp   SAVORY DILATION N/A 09/25/2020   Procedure: SAVORY DILATION;  Surgeon: Leigh Elspeth SQUIBB, MD;  [facility]: [facility];  Service: Gastroenterology;  Laterality: N/A;   SAVORY DILATION N/A 06/15/2023   Procedure: SAVORY DILATION;  Surgeon: Leigh Elspeth SQUIBB, MD;  Location: [facility];  Service: Gastroenterology;  Laterality: N/A;   TONSILLECTOMY      Social History   Socioeconomic History   Marital status: Single    Spouse name: Not on file   Number of children: 2   Years of education: College   Highest education level: Not on file  Occupational History   Not on file  Tobacco Use   Smoking status: Former    Current packs/day: 0.00    Average packs/day: 0.3 packs/day for 15.0 years (3.8 ttl pk-yrs)    Types: Cigarettes    Start date: 04/28/2004    Quit date: 04/29/2019    Years since quitting: 5.6   Smokeless tobacco: Never   Tobacco comments:    stopped smoking 04-29-2019  Vaping Use   Vaping status: Never Used  Substance and Sexual Activity   Alcohol use: No    Comment: special occassions   Drug use: No   Sexual activity: Yes    Birth control/protection: Surgical  Other Topics Concern   Not on file  Social History Narrative   Lives with fiance and children   Caffeine use:  Hot ginger tea- daily   Left-handed   Social Drivers of Health   Tobacco Use: Medium Risk (09/29/2024)   Patient History    Smoking Tobacco Use: Former    Smokeless Tobacco Use: Never    Passive Exposure: Not on Actuary Strain: Not on file  Food Insecurity: Not on file  Transportation Needs: Not on file  Physical Activity: Not on file  Stress: Not on file  Social Connections: Not on file  Depression (PHQ2-9): Low  Risk (09/29/2024)   Depression (PHQ2-9)    PHQ-2 Score: 1  Alcohol Screen: Not on file  Housing: Not on file  Utilities: Not on file  Health Literacy: Not on file     FAMILY HISTORY:  We obtained a detailed, 4-generation family history.  Significant diagnoses are listed below: Family History  Problem Relation Age of  Onset   Stroke Mother    Hypertension Mother    Heart attack Mother    Breast cancer Mother 66   Neuropathy Mother    Arthritis Mother    Asthma Mother    Peripheral Artery Disease Mother 64       had her leg amputated 2020   Heart disease Father    Hypertension Father    Neuropathy Sister    Arthritis Sister    Asthma Sister    Cervical cancer Sister 79   Cancer Maternal Uncle 88       spine   Colon cancer Paternal Uncle 6   Prostate cancer Paternal Uncle 32   Cancer Paternal Uncle 65   Cancer Paternal Grandmother 74       vulvar   Esophageal cancer Neg Hx    Rectal cancer Neg Hx     Raven [name] is unaware of previous family history of genetic testing for hereditary cancer risks There is no reported Ashkenazi Jewish ancestry.     GENETIC COUNSELING ASSESSMENT: Raven [name] is a 43 y.o. female with a family history of cancer which is somewhat suggestive of a hereditary predisposition to cancer given her family history of a first degree relative diagnosed with breast cancer under the age of 30. We, therefore, discussed and recommended the following at today's visit.   DISCUSSION: We discussed that 5 - 10% of cancer is hereditary, with most cases of hereditary breast cancer associated with pathogenic variants in BRCA1/2.  There are other genes that can be associated with hereditary breast cancer syndromes.  We discussed that testing is beneficial for several reasons, including knowing about other cancer risks, identifying potential screening and risk-reduction options that may be appropriate, and to understanding if other family members could be at risk for cancer and  allowing them to undergo genetic testing.  We reviewed the characteristics, features and inheritance patterns of hereditary cancer syndromes. We also discussed genetic testing, including the appropriate family members to test, the process of testing, insurance coverage and turn-around-time for results. We discussed the implications of a negative, positive, carrier and/or variant of uncertain significant result. We discussed that negative results would be uninformative given that Raven [name] does not have a personal history of cancer. We recommended Raven [name] pursue genetic testing for a panel that contains genes associated with breast cancer.  Raven [name] was offered a common hereditary cancer panel (40+ genes) and an expanded pan-cancer panel (70+ genes). Raven [name] was informed of the benefits and limitations of each panel, including that expanded pan-cancer panels contain several genes that do not have clear management guidelines at this point in time.  We also discussed that as the number of genes included on a panel increases, the chances of variants of uncertain significance increases.  After considering the benefits and limitations of each gene panel, Raven [name] elected to have [facility] CancerNext-Expanded +RNAinsight panel. The CancerNext-Expanded gene panel offered by [facility] and includes sequencing, rearrangement, and RNA analysis for the following 77 genes: AIP, ALK, APC, ATM, AXIN2, BAP1, BARD1, BMPR1A, BRCA1, BRCA2, BRIP1, CDC73, CDH1, CDK4, CDKN1B, CDKN2A, CEBPA, CHEK2, CTNNA1, DDX41, DICER1, EGFR, EPCAM, ETV6, FH, FLCN, GATA2, GREM1, HOXB13, KIT, LZTR1, MAX, MBD4, MEN1, MET, MITF, MLH1, MSH2, MSH3, MSH6, MUTYH, NF1, NF2, NTHL1, PALB2, PDGFRA, PHOX2B, PMS2, POLD1, POLE, POT1, PRKAR1A, PTCH1, PTEN, RAD51C, RAD51D, RB1, RET, RPS20, RUNX1, SDHA, SDHAF2, SDHB, SDHC, SDHD, SMAD4, SMARCA4, SMARCB1, SMARCE1, STK11, SUFU, TMEM127, TP53, TSC1, TSC2, VHL, WT1.  Based on Ms.  [name]'s family history of cancer, she  meets medical criteria for genetic testing. Though Raven [name] is not personally affected, there are no affected family members that are willing/able to undergo hereditary cancer testing. Despite that she meets criteria, she may still have an out of pocket cost. We discussed that Medicare only covers the cost of genetic testing for individuals diagnosed with cancer. However, Raven [name] does qualify for financial assistance through [employer]. She completed [facility]'s Patient Assistance Form on their website in office today and was determine to qualify for testing at $0 out of pocket.   We discussed that some people do not want to undergo genetic testing due to fear of genetic discrimination.  A federal law called the Genetic Information Non-Discrimination Act (GINA) of 2008 helps protect individuals against genetic discrimination based on their genetic test results.  It impacts both health insurance and employment.  With health insurance, it protects against increased premiums, being kicked off insurance or being forced to take a test in order to be insured.  For employment it protects against hiring, firing and promoting decisions based on genetic test results.  GINA does not apply to those in the [employer], those who work for companies with less than 15 employees, and new life insurance or long-term disability insurance policies.  Health status due to a cancer diagnosis is not protected under GINA.  Will run breast cancer risk model as indicated based on results of genetic testing.   PLAN: After considering the risks, benefits, and limitations, Raven [name] provided informed consent to pursue genetic testing and the blood sample was sent to [facility] for analysis of the CancerNext-Expanded +RNAinsight panel. Results should be available within approximately 2-3 weeks' time, at which point they will be disclosed by telephone to Raven [name], as will any additional recommendations warranted by these results. Ms.  [name] will receive a summary of her genetic counseling visit and a copy of her results once available. This information will also be available in Epic.   Lastly, we encouraged Raven [name] to remain in contact with cancer genetics annually so that we can continuously update the family history and inform her of any changes in cancer genetics and testing that may be of benefit for this family.   Raven [name]'s questions were answered to her satisfaction today. Our contact information was provided should additional questions or concerns arise. Thank you for the referral and allowing us  to share in the care of your patient.   Burnard Ogren, MS, [facility] Licensed, [email or web address] phone: [phone]   60 minutes were spent on the date of the encounter in service to the patient including preparation, face-to-face consultation, documentation and care coordination.  The patient was seen alone.  Drs. Gudena and/or Lanny were available to discuss this case as needed.  _______________________________________________________________________ For Office Staff:  Number of people involved in session: 1 Was an Intern/ student involved with case: no

## 2024-12-13 ENCOUNTER — Other Ambulatory Visit: Payer: Self-pay

## 2024-12-13 ENCOUNTER — Telehealth: Payer: Self-pay

## 2024-12-13 DIAGNOSIS — R1310 Dysphagia, unspecified: Secondary | ICD-10-CM

## 2024-12-13 DIAGNOSIS — K222 Esophageal obstruction: Secondary | ICD-10-CM

## 2024-12-13 NOTE — Progress Notes (Signed)
 SCHEDULED PATIENT FOR EGD WITH DILATION AT [facility] ON 02-13-25

## 2024-12-13 NOTE — Telephone Encounter (Signed)
 Called and spoke to patient to schedule her for EGD at [facility] w/ Dr. Leigh on 02-13-25.  Patient agreeable to date.  She is also having symptoms and would like a refill of Bentyl  sent to her pharmacy.  She take every 8 hours as needed for abdominal pain.  Based on the Sx she is currently having patient has been scheduled for an appointment with [facility] in January since [facility] next opening is in Feb.  Patient will be added to the wait list in case an earlier appointment becomes available.   Ok to refill Bentyl ?

## 2024-12-14 MED ORDER — DICYCLOMINE HCL 10 MG PO CAPS: 10.0000 mg | ORAL_CAPSULE | Freq: Three times a day (TID) | ORAL | 3 refills | Status: DC | PRN

## 2024-12-14 NOTE — Addendum Note (Signed)
 Addended by: Evelina Lore M on: 12/14/2024 10:07 AM   Modules accepted: Orders

## 2024-12-14 NOTE — Telephone Encounter (Signed)
Bentyl sent to pharmacy

## 2024-12-14 NOTE — Telephone Encounter (Signed)
 Yes okay to refill Bentyl . Thanks

## 2024-12-26 ENCOUNTER — Ambulatory Visit: Payer: Self-pay | Admitting: Genetic Counselor

## 2024-12-26 DIAGNOSIS — Z1379 Encounter for other screening for genetic and chromosomal anomalies: Secondary | ICD-10-CM | POA: Insufficient documentation

## 2024-12-26 NOTE — Progress Notes (Signed)
 HPI:  Ms. Berber was previously seen in the [facility] due to a family history of cancer and concerns regarding a hereditary predisposition to cancer. Please refer to our prior cancer genetics clinic note for more information regarding our discussion, assessment and recommendations, at the time. Ms. Mcloughlin's recent genetic test results were disclosed to her, as were recommendations warranted by these results. These results and recommendations are discussed in more detail below.  Results were disclosed via telephone on 12/26/24.   FAMILY HISTORY:  We obtained a detailed, 4-generation family history.  Significant diagnoses are listed below: Family History  Problem Relation Age of Onset   Stroke Mother    Hypertension Mother    Heart attack Mother    Breast cancer Mother 28   Neuropathy Mother    Arthritis Mother    Asthma Mother    Peripheral Artery Disease Mother 60       had her leg amputated 2020   Heart disease Father    Hypertension Father    Neuropathy Sister    Arthritis Sister    Asthma Sister    Cervical cancer Sister 44   Cancer Maternal Uncle 44       spine   Colon cancer Paternal Uncle 60   Prostate cancer Paternal Uncle 44   Cancer Paternal Uncle 48   Cancer Paternal Grandmother 21       vulvar   Esophageal cancer Neg Hx    Rectal cancer Neg Hx   Ms. Hollman is unaware of previous family history of genetic testing for hereditary cancer risks There is no reported Ashkenazi Jewish ancestry.     GENETIC TEST RESULTS: Genetic testing reported out on 12/19/24 through the [facility] CancerNext-Expanded +RNAinsight panel found no pathogenic mutations. The CancerNext-Expanded gene panel offered by [facility] and includes sequencing, rearrangement, and RNA analysis for the following 77 genes: AIP, ALK, APC, ATM, AXIN2, BAP1, BARD1, BMPR1A, BRCA1, BRCA2, BRIP1, CDC73, CDH1, CDK4, CDKN1B, CDKN2A, CEBPA, CHEK2, CTNNA1, DDX41, DICER1, EGFR, EPCAM, ETV6, FH, FLCN,  GATA2, GREM1, HOXB13, KIT, LZTR1, MAX, MBD4, MEN1, MET, MITF, MLH1, MSH2, MSH3, MSH6, MUTYH, NF1, NF2, NTHL1, PALB2, PDGFRA, PHOX2B, PMS2, POLD1, POLE, POT1, PRKAR1A, PTCH1, PTEN, RAD51C, RAD51D, RB1, RET, RPS20, RUNX1, SDHA, SDHAF2, SDHB, SDHC, SDHD, SMAD4, SMARCA4, SMARCB1, SMARCE1, STK11, SUFU, TMEM127, TP53, TSC1, TSC2, VHL, WT1. The test report has been scanned into EPIC and is located under the Molecular Pathology section of the Results Review tab.  A portion of the result report is included below for reference.     We discussed with Ms. Mulvihill that because current genetic testing is not perfect, it is possible there may be a gene mutation in one of these genes that current testing cannot detect, but that chance is small.  We also discussed, that there could be another gene that has not yet been discovered, or that we have not yet tested, that is responsible for the cancer diagnoses in the family. It is also possible there is a hereditary cause for the cancer in the family that Ms. Wanek did not inherit and therefore was not identified in her testing.  Therefore, it is important to remain in touch with cancer genetics in the future so that we can continue to offer Ms. Mattila the most up to date genetic testing.   ADDITIONAL GENETIC TESTING: We discussed with Ms. Westcott that her genetic testing was fairly extensive for the family history of cancer.  If there are genes identified to increase  cancer risk that can be analyzed in the future, we would be happy to discuss and coordinate this testing at that time.   Recommend referral to Medical Genetics given history of idiopathic cardiomyopathy, heart failure, and family history of heart failure. Discussed possible genetic causes for cardiomyopathy. Patient declined referral at this time, will discuss with the heart failure clinic.    CANCER SCREENING RECOMMENDATIONS: Ms. Huisman test result is considered negative (normal).  This means that we have not identified  a hereditary cause for her family history of cancer at this time. Most cancers happen by chance and this negative test suggests that her family history of cancer may fall into this category.    Possible reasons for Ms. Moler's negative genetic test include:  1. There may be a gene mutation in one of these genes that current testing methods cannot detect but that chance is small.  2. There could be another gene that has not yet been discovered, or that we have not yet tested, that is responsible for the cancer diagnoses in the family.  3.  There may be no hereditary risk for cancer in the family. The cancers in Ms. Blatchford and/or her family may be sporadic/familial or due to other genetic and environmental factors. 4. It is also possible there is a hereditary cause for the cancer in the family that Ms. Mower did not inherit.  Therefore, it is recommended she continue to follow the cancer management and screening guidelines provided by her primary healthcare provider. An individual's cancer risk and medical management are not determined by genetic test results alone. Overall cancer risk assessment incorporates additional factors, including personal medical history, family history, and any available genetic information that may result in a personalized plan for cancer prevention and surveillance  Given Ms. Stoecker's family history, we must interpret these negative results with some caution.  Families with features suggestive of hereditary risk for cancer tend to have multiple family members with cancer, diagnoses in multiple generations and diagnoses before the age of 58. Ms. Welp's family exhibits some of these features. Thus, this result may simply reflect our current inability to detect all mutations within these genes or there may be a different gene that has not yet been discovered or tested.   Ms. Sochacki has been determined to be at high risk for breast cancer. her Tyrer-Cuzick risk score is 20.6%.  For women  with a greater than 20% lifetime risk of breast cancer, the [employer] (NCCN) recommends the following:   Clinical encounter every 6-12 months to begin when identified as being at increased risk, but not before age 22  Annual mammograms. Tomosynthesis is recommended starting 10 years earlier than the youngest breast cancer diagnosis in the family or at age 37 (whichever comes first), but not before age 24   Annual breast MRI starting 10 years earlier than the youngest breast cancer diagnosis in the family or at age 73 (whichever comes first), but not before age 2.   We, therefore, discussed that it is reasonable for Ms. Buffin to be followed by a high-risk breast cancer clinic; in addition to a yearly mammogram and physical exam by a healthcare provider, she should discuss the usefulness of an annual breast MRI with the high-risk clinic providers. She declined a referral to a High Risk Clinic at this time. Plans to discuss with her primary care provider.   RECOMMENDATIONS FOR FAMILY MEMBERS:  Individuals in this family might be at some increased risk of  developing cancer, over the general population risk, simply due to the family history of cancer.  We recommended women in this family have a yearly mammogram beginning at age 29, or 19 years younger than the earliest onset of cancer, an annual clinical breast exam, and perform monthly breast self-exams. Women in this family should also have a gynecological exam as recommended by their primary provider. All family members should be referred for colonoscopy starting at age 56, or 55 years younger than the earliest onset of cancer.  It is also possible there is a hereditary cause for the cancer in Ms. Guallpa's family that she did not inherit and therefore was not identified in her.  Based on Ms. Garoutte's family history, we recommended her mother, who was diagnosed with breast cancer at age 14, have genetic counseling and testing. Ms. Mathia  will let us  know if we can be of any assistance in coordinating genetic counseling and/or testing for this family member.   FOLLOW-UP: Lastly, we discussed with Ms. Skillman that cancer genetics is a rapidly advancing field and it is possible that new genetic tests will be appropriate for her and/or her family members in the future. We encouraged her to remain in contact with cancer genetics on an annual basis so we can update her personal and family histories and let her know of advances in cancer genetics that may benefit this family.   Our contact number was provided. Ms. Haralson's questions were answered to her satisfaction, and she knows she is welcome to call us  at anytime with additional questions or concerns.   Burnard Ogren, MS, [facility] Licensed, [email or web address] [phone]

## 2025-01-05 ENCOUNTER — Ambulatory Visit: Admitting: Obstetrics and Gynecology

## 2025-01-05 ENCOUNTER — Encounter: Payer: Self-pay | Admitting: Obstetrics and Gynecology

## 2025-01-05 VITALS — BP 127/83 | HR 94 | Ht 69.0 in | Wt 180.0 lb

## 2025-01-05 LAB — POCT URINE PREGNANCY: Preg Test, Ur: NEGATIVE

## 2025-01-05 NOTE — Progress Notes (Addendum)
 44 y.o. GYN presents for COLPO HIGH RISK HPV ([facility]): Positive Abnormal  YPV 16 ([facility]): Negative HPV 18 / 45 ([place]): Negative ADEQUACY: Satisfactory for evaluation; transformation zone component PRESENT. 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  Pt  period started today, she will reschedule.

## 2025-01-06 NOTE — Progress Notes (Signed)
 Patient on her period. Rescheduled.

## 2025-01-10 ENCOUNTER — Telehealth (HOSPITAL_COMMUNITY): Payer: Self-pay | Admitting: Cardiology

## 2025-01-10 NOTE — Telephone Encounter (Signed)
 Called to confirm/remind patient of their appointment at the [facility] on 01/10/2025.   Appointment:   [x] Confirmed  [] Left mess   [] No answer/No voice mail  [] VM Full/unable to leave message  [] Phone not in service  Patient reminded to bring all medications and/or complete list.  Confirmed patient has transportation. Gave directions, instructed to utilize valet parking.

## 2025-01-11 ENCOUNTER — Encounter (HOSPITAL_COMMUNITY): Payer: Self-pay | Admitting: Cardiology

## 2025-01-11 ENCOUNTER — Ambulatory Visit (HOSPITAL_COMMUNITY)
Admission: RE | Admit: 2025-01-11 | Discharge: 2025-01-11 | Disposition: A | Discharge status: Home / Self Care | Source: Ambulatory Visit | Attending: Cardiology | Admitting: Cardiology

## 2025-01-11 VITALS — BP 120/88 | HR 89 | Wt 180.8 lb

## 2025-01-11 DIAGNOSIS — I5022 Chronic systolic (congestive) heart failure: Secondary | ICD-10-CM | POA: Insufficient documentation

## 2025-01-11 DIAGNOSIS — Z91148 Patient's other noncompliance with medication regimen for other reason: Secondary | ICD-10-CM | POA: Insufficient documentation

## 2025-01-11 DIAGNOSIS — I493 Ventricular premature depolarization: Secondary | ICD-10-CM | POA: Diagnosis not present

## 2025-01-11 DIAGNOSIS — R1310 Dysphagia, unspecified: Secondary | ICD-10-CM | POA: Diagnosis not present

## 2025-01-11 DIAGNOSIS — I110 Hypertensive heart disease with heart failure: Secondary | ICD-10-CM | POA: Insufficient documentation

## 2025-01-11 NOTE — Patient Instructions (Signed)
 There has been no changes to your medications.  Your physician has requested that you have an echocardiogram. Echocardiography is a painless test that uses sound waves to create images of your heart. It provides your doctor with information about the size and shape of your heart and how well your hearts chambers and valves are working. This procedure takes approximately one hour. There are no restrictions for this procedure. Please do NOT wear cologne, perfume, aftershave, or lotions (deodorant is allowed). Please arrive 15 minutes prior to your appointment time.  Please note: We ask at that you not bring children with you during ultrasound (echo/ vascular) testing. Due to room size and safety concerns, children are not allowed in the ultrasound rooms during exams. Our front office staff cannot provide observation of children in our lobby area while testing is being conducted. An adult accompanying a patient to their appointment will only be allowed in the ultrasound room at the discretion of the ultrasound technician under special circumstances. We apologize for any inconvenience.  Your physician recommends that you schedule a follow-up appointment in: 3 months with an echocardiogram ( April) ** PLEASE CALL THE OFFICE IN [place] TO ARRANGE YOUR FOLLOW UP APPOINTMENT.**  If you have any questions or concerns before your next appointment please send us  a message through [place] or call our office at [phone].    TO LEAVE A MESSAGE FOR THE NURSE SELECT OPTION 2, PLEASE LEAVE A MESSAGE INCLUDING: YOUR NAME DATE OF BIRTH CALL BACK NUMBER REASON FOR CALL**this is important as we prioritize the call backs  YOU WILL RECEIVE A CALL BACK THE SAME DAY AS LONG AS YOU CALL BEFORE 4:00 PM  At the [facility], you and your health needs are our priority. As part of our continuing mission to provide you with exceptional heart care, we have created designated Provider Care Teams. These  Care Teams include your primary Cardiologist (physician) and Advanced Practice Providers (APPs- Physician Assistants and Nurse Practitioners) who all work together to provide you with the care you need, when you need it.   You may see any of the following providers on your designated Care Team at your next follow up: Dr Toribio Fuel Dr Ezra Shuck Dr. Morene Brownie Greig Mosses, NP Caffie Shed, [place] [facility] [place], [place] Beckey Coe, NP Jordan Lee, NP Ellouise Class, NP Tinnie Redman, PharmD Jaun Bash, PharmD   Please be sure to bring in all your medications bottles to every appointment.    Thank you for choosing [facility]

## 2025-01-11 NOTE — Progress Notes (Signed)
" ° °  ADVANCED HEART FAILURE NEW PATIENT CLINIC NOTE  Referring Physician: Ilah Crigler, MD  Primary Care: Ilah Crigler, MD Primary Cardiologist:  HPI: Raven [name] is a 44 y.o. female with a PMH of chronic systolic heart failure, recurrent esophageal dilations who presents for initial visit for further evaluation and treatment of heart failure/cardiomyopathy.      Patient with extensive prior cardiac history.  Initially seen by Dr. Fernande in 2016, ejection fraction in 2014 was mildly reduced at 45% and she was having infrequent PVCs at the time.  Normal stress test at that time.  Was started on low-dose heart failure medication at that time.  Prior allergic reaction to heart monitor.  LVEF dropped to 30 to 35% 2021, medical therapy complicated by issues related to her dysphagia.  Referral placed for heart failure clinic 2024.  Prior history of proximal esophageal stricture that has been dilated in the past, most recent 05/2024.  Remained largely asymptomatic from a cardiac perspective     SUBJECTIVE:  Patient reports that she has not been taking any of her HF medications. She denies any recent worsening of her shortness of breath, chest pain, orthopnea, swelling, PND. She has lost a good bit of weight and is feeling overall fairly well. She has had issues with medications in the past and would like to hopefully improve things naturally. We discussed that this may be difficult, but given her lack of symptoms will obtain repeat echo and reassess.   PMH, current medications, allergies, social history, and family history reviewed in epic.  PHYSICAL EXAM: Vitals:   01/11/25 1005  BP: 120/88  Pulse: 89  SpO2: 100%   GENERAL: Well nourished and in no apparent distress at rest.  PULM:  Normal work of breathing, clear to auscultation bilaterally. Respirations are unlabored.  CARDIAC:  JVP: flat         Normal rate with regular rhythm. No murmurs, rubs or gallops.  No edema. Warm and well  perfused extremities. ABDOMEN: Soft, non-tender, non-distended. NEUROLOGIC: Patient is oriented x3 with no focal or lateralizing neurologic deficits.    DATA REVIEW  ECG: 12/2024: NSR, anterior infarct patter    ECHO: 05/28/2023: LVEF 30-35%, normal PA pressures  CATH: None   CMR: 2022, severely decreased LVEF 34%, normal RV size and function,   ASSESSMENT & PLAN:  Chronic systolic heart failure: Last EF 30-35%, progressive decline. History of AIVR, but minimal PVCs. Potentially a peripartum component, but no clear trigger, no significant family history of HF.  - Off medications by choice, discussed the risks involved with this but NYHA class I currently - Medications limited by her dysphagia - Repeat echo to assess  PVCs: Occasional symptoms.  - Consider restarting metoprolol  in the future  Hypertension:  - Well controlled today  Follow up in 3 months  Morene Brownie, MD Advanced Heart Failure Mechanical Circulatory Support 01/11/2025 "

## 2025-01-18 NOTE — Progress Notes (Unsigned)
 "    01/18/2025 Raven [name] [id number] [birth year]  Primary Gastroenterologist: Dr. Leigh    Chief Complaint:  History of Present Illness:  Raven [name]. Raven [name] is a 44 year old female with a past medical history of fibromyalgia, asthma, nonischemic cardiomyopathy, CHF, PVCs, GERD, chronic oropharyngeal dysphagia, tension dysphonia, chronic narcotic use with chronic constipation.    Discussed the use of AI scribe software for clinical note transcription with the patient, who gave verbal consent to proceed.  History of Present Illness       Latest Ref Rng & Units 04/07/2024    7:34 PM 04/28/2023    4:25 PM 01/14/2022   10:03 AM  CBC  WBC 4.0 - 10.5 K/uL 11.1  8.9  8.2   Hemoglobin 12.0 - 15.0 g/dL 87.2  86.1  86.5   Hematocrit 36.0 - 46.0 % 36.8  41.2  40.7   Platelets 150 - 400 K/uL 207  221  202.0        Latest Ref Rng & Units 04/07/2024    7:34 PM 04/28/2023    4:25 PM 01/14/2022   10:03 AM  CMP  Glucose 70 - 99 mg/dL 865  83  890   BUN 6 - 20 mg/dL 10  10  9    Creatinine 0.44 - 1.00 mg/dL 9.18  9.22  9.22   Sodium 135 - 145 mmol/L 139  141  139   Potassium 3.5 - 5.1 mmol/L 3.7  4.7  4.1   Chloride 98 - 111 mmol/L 111  105  106   CO2 22 - 32 mmol/L 23  20  25    Calcium 8.9 - 10.3 mg/dL 8.6  9.5  9.1   Total Protein 6.0 - 8.3 g/dL   7.3   Total Bilirubin 0.2 - 1.2 mg/dL   0.6   Alkaline Phos 39 - 117 U/L   55   AST 0 - 37 U/L   17   ALT 0 - 35 U/L   22      Laryngoscopy 11/13/2021: The nasopharynx is clear.  The tongue base, vallecula and epiglottis are normal. Vocal folds are fully mobile for abduction and adduction. Moderate MTD. No vocal cord nodules, cysts, lesions or masses are seen.  The piriform sinuses are clear. The inner arytenoid area is normal.   ENT lVideo larynostroboscopy on 05/03/20- Salient features from that report include: Severe to profound anterior to posterior compression (left arytenoid touches petiole of epiglottis) during phonation  reflective of muscle tension dysphonia/laryngeal spasm, bilateral vocal fold fullness likely secondary to smoking history (mild polypoid degeneration). Her dysphonia and laryngoscopy findings were consistent with tension dysphonia per ENT.   Manometry done again Feb 2020 - normal Barium swallow 01/27/19 - reflux noted, otherwise normal exam   Modified barium swallow 01/26/2018 - normal CT neck 12/10/2017 - normal   Esophageal manometry 02/23/15 - normal, no evidence of dysmotility Barium swallow 10/04/2014 - incomplete clearance of contrast in upper esophagus, absent secondary peristalsis, concern for dysmotility   Colonoscopy 05/10/2014 - 8mm ascending colon polyp, 3mm ascending colon polyp, diverticulosis sigmoid colon - one polyp TA, one polyp HP     EGD 06/08/19 -  - The exam of the esophagus was otherwise normal. - A guidewire was placed and the scope was withdrawn. Empiric dilation was performed in the entire esophagus with a Savary dilator with mild resistance at 17 mm and 18 mm. Relook endoscopy showed an appropriate mucosal wrent at the proximal esophagus / UES, suspect subtle  stenosis there was causing dysphagia. Biopsies were taken with a cold forceps in the upper third of the esophagus, in the middle third of the esophagus and in the lower third of the esophagus for histology, rule out EoE. - The entire examined stomach was normal. - The duodenal bulb and second portion of the duodenum were normal.  Path benign    Colonoscopy 06/08/19 - Hemorrhoids were found on perianal exam. - A 3 mm polyp was found in the cecum. The polyp was sessile. The polyp was removed with a cold snare. Resection and retrieval were complete. - Multiple small-mouthed diverticula were found in the ascending colon and left colon. - Internal hemorrhoids were found during retroflexion. - The exam was otherwise without abnormality.  Path adenoma - repeat colonoscopy in 7 years   EGD 09/29/19 - Esophagogastric  landmarks identified. - Normal esophagus - empiric dilation performed to 18mm with good result, patient appears to have a subtle stenosis at UES which was dilated - Normal stomach. - Normal duodenal bulb and second portion of the duodenum.  EGD 09/05/2020: - Esophagogastric landmarks identified.  - Normal esophagus otherwise - empiric dilation to 18mm performed, leading to 2 appropriate mucosal wrents at the UES. Patient has a subtle stenosis there, hopefully dilation will provide benefit as it has in the past.  - Normal stomach.  - Normal duodenal bulb and second portion of the duodenum.  EGD 01/30/2022: - Z-line regular, 36 cm from the incisors.  - No endoscopic esophageal abnormality to explain patient's dysphagia. Esophagus dilated. Dilated.  - Normal stomach - Normal duodenum  EGD 06/15/2023: - Small hiatal hernia - Z-line regular.  - Normal esophagus otherwise - dilated to 18mm with good dilation effect at site of suspected subtle stricture just inferior to the UES.  - Erythematous mucosa in the antrum.  - Normal stomach otherwise - biopsies taken to rule out H pylori  - Normal duodenal bulb and second portion of the duodenum.  EGD 04/07/2024 by Dr. Charlanne done in the ED due to food impaction:  - Mild erythema in the proximal esophagus s/o recent food impaction s/p spontaneous food disimpaction. No definite stricture noted.  - Some food in the stomach.  - No specimens collected.   EGD 06/02/2024: - Esophagogastric landmarks identified. - 1 cm hiatal hernia.  - Normal esophagus - dilated to 18mm with good dilation effect at subtle stricture just inferior to the UES.  - Normal stomach.  - Normal examined duodenum - Repeat EGD with dilation in 6-12 months to help prevent recurrent symptoms / impaction. Given her course to date with recurrent symptoms I think scheduled dilations may be best for this patient to minimize symptom recurrence in the future  Current Medications,  Allergies, Past Medical History, Past Surgical History, Family History and Social History were reviewed in [employer] record.   Review of Systems:   Constitutional: Negative for fever, sweats, chills or weight loss.  Respiratory: Negative for shortness of breath.   Cardiovascular: Negative for chest pain, palpitations and leg swelling.  Gastrointestinal: See HPI.  Musculoskeletal: Negative for back pain or muscle aches.  Neurological: Negative for dizziness, headaches or paresthesias.    Physical Exam: LMP 01/05/2025 (Exact Date)  General: in no acute distress. Head: Normocephalic and atraumatic. Eyes: No scleral icterus. Conjunctiva pink . Ears: Normal auditory acuity. Mouth: Dentition intact. No ulcers or lesions.  Lungs: Clear throughout to auscultation. Heart: Regular rate and rhythm, no murmur. Abdomen: Soft, nontender and nondistended. No  masses or hepatomegaly. Normal bowel sounds x 4 quadrants.  Rectal: *** Musculoskeletal: Symmetrical with no gross deformities. Extremities: No edema. Neurological: Alert oriented x 4. No focal deficits.  Psychological: Alert and cooperative. Normal mood and affect  Assessment and Recommendations:  History of colon polys. Father with history of colon polyps. Paternal uncle diagnosed with colon cancer age 30.  Her most recent colonoscopy 06/08/2019 identified a 3 mm tubular adenomatous polyp removed from the cecum. -Recall colonoscopy 05/2026    "

## 2025-01-19 ENCOUNTER — Encounter: Payer: Self-pay | Admitting: Gastroenterology

## 2025-01-19 ENCOUNTER — Ambulatory Visit: Admitting: Nurse Practitioner

## 2025-01-26 ENCOUNTER — Encounter (HOSPITAL_BASED_OUTPATIENT_CLINIC_OR_DEPARTMENT_OTHER): Payer: Self-pay

## 2025-01-26 ENCOUNTER — Other Ambulatory Visit: Payer: Self-pay

## 2025-01-26 ENCOUNTER — Encounter: Payer: Self-pay | Admitting: Obstetrics and Gynecology

## 2025-01-26 ENCOUNTER — Ambulatory Visit: Admitting: Obstetrics and Gynecology

## 2025-01-26 ENCOUNTER — Emergency Department (HOSPITAL_BASED_OUTPATIENT_CLINIC_OR_DEPARTMENT_OTHER)
Admission: EM | Admit: 2025-01-26 | Discharge: 2025-01-27 | Disposition: A | Discharge status: Home / Self Care | Source: Ambulatory Visit | Attending: Emergency Medicine | Admitting: Emergency Medicine

## 2025-01-26 ENCOUNTER — Other Ambulatory Visit (HOSPITAL_COMMUNITY): Admission: RE | Admit: 2025-01-26 | Discharge: 2025-01-26 | Disposition: A | Source: Ambulatory Visit

## 2025-01-26 ENCOUNTER — Other Ambulatory Visit (HOSPITAL_COMMUNITY)
Admission: RE | Admit: 2025-01-26 | Discharge: 2025-01-26 | Disposition: A | Source: Ambulatory Visit | Attending: Obstetrics and Gynecology | Admitting: Obstetrics and Gynecology

## 2025-01-26 VITALS — BP 150/84 | HR 88 | Ht 69.0 in | Wt 183.2 lb

## 2025-01-26 DIAGNOSIS — Z113 Encounter for screening for infections with a predominantly sexual mode of transmission: Secondary | ICD-10-CM

## 2025-01-26 DIAGNOSIS — I509 Heart failure, unspecified: Secondary | ICD-10-CM | POA: Diagnosis not present

## 2025-01-26 DIAGNOSIS — Z3202 Encounter for pregnancy test, result negative: Secondary | ICD-10-CM | POA: Diagnosis not present

## 2025-01-26 DIAGNOSIS — R300 Dysuria: Secondary | ICD-10-CM | POA: Diagnosis present

## 2025-01-26 DIAGNOSIS — I110 Hypertensive heart disease with heart failure: Secondary | ICD-10-CM | POA: Diagnosis not present

## 2025-01-26 DIAGNOSIS — R87810 Cervical high risk human papillomavirus (HPV) DNA test positive: Secondary | ICD-10-CM

## 2025-01-26 DIAGNOSIS — R87612 Low grade squamous intraepithelial lesion on cytologic smear of cervix (LGSIL): Secondary | ICD-10-CM | POA: Insufficient documentation

## 2025-01-26 DIAGNOSIS — F17210 Nicotine dependence, cigarettes, uncomplicated: Secondary | ICD-10-CM | POA: Diagnosis not present

## 2025-01-26 DIAGNOSIS — J45909 Unspecified asthma, uncomplicated: Secondary | ICD-10-CM | POA: Insufficient documentation

## 2025-01-26 LAB — URINALYSIS, ROUTINE W REFLEX MICROSCOPIC
Bilirubin Urine: NEGATIVE
Glucose, UA: NEGATIVE mg/dL
Ketones, ur: NEGATIVE mg/dL
Leukocytes,Ua: NEGATIVE
Nitrite: NEGATIVE
Protein, ur: NEGATIVE mg/dL
Specific Gravity, Urine: 1.014 (ref 1.005–1.030)
pH: 5.5 (ref 5.0–8.0)

## 2025-01-26 NOTE — Progress Notes (Signed)
 Pt presents for Colpo. Requesting STD testing.   Negative UPT today

## 2025-01-26 NOTE — ED Triage Notes (Signed)
 Colposcopy this AM, now it hurts so bad, so swollen, now it's foaming coming out. Voices pain w urination, urge to urinate since procedure. Denies fever/ additional symptoms  States bloodwork done this AM, would like to start w UA

## 2025-01-26 NOTE — Progress Notes (Signed)
 Colposcopy Procedure Note  Raven [name] is a 44 y.o. [id number] here for colposcopy. Also wants STI screening.   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    The risks (including infection, bleeding, pain) and benefits of the procedure were explained to the patient and written informed consent was obtained.  The patient was placed in the dorsal lithotomy position. A Graves was speculum inserted in the vagina, and the cervix was visualized.  The cervix was stained with acetic acid and visualized using the colposcope under magnification as well as with a green filter. Findings as below. Cervical biopsies were taken at 4, 6, 12 o'clock. Endocervical curettage then performed in all four quadrants. Small amount of bleeding noted that improved with pressure. Monsel's solution applied with good hemostasis noted. Patient tolerating procedure well.  Findings: acetowhite and increased vascularity at 12, acetohite at 4, increased vascularity at 6  Impression: low grade  Adequate: yes  Specimens:  Cervical biopsy at 12, 4, 6 o'clock Endocervical curettage  Condition: Stable  Complications: None  Plan: The patient was advised to call for any fever or for prolonged or severe pain or bleeding. She was advised to use OTC analgesics as needed for mild to moderate pain. She was advised to avoid vaginal intercourse for 48 hours or until the bleeding has completely stopped.  Will base further management on results of biopsy.   LOIS Yolanda Moats, MD, [facility] Attending Center for [facility])

## 2025-01-27 ENCOUNTER — Telehealth: Payer: Self-pay

## 2025-01-27 ENCOUNTER — Ambulatory Visit: Payer: Self-pay | Admitting: Obstetrics and Gynecology

## 2025-01-27 LAB — URINE CYTOLOGY ANCILLARY ONLY
Chlamydia: NEGATIVE
Comment: NEGATIVE
Comment: NEGATIVE
Comment: NORMAL
Neisseria Gonorrhea: NEGATIVE
Trichomonas: NEGATIVE

## 2025-01-27 LAB — SYPHILIS: RPR W/REFLEX TO RPR TITER AND TREPONEMAL ANTIBODIES, TRADITIONAL SCREENING AND DIAGNOSIS ALGORITHM: RPR Ser Ql: NONREACTIVE

## 2025-01-27 LAB — HIV ANTIBODY (ROUTINE TESTING W REFLEX): HIV Screen 4th Generation wRfx: NONREACTIVE

## 2025-01-27 LAB — HEPATITIS C ANTIBODY: Hep C Virus Ab: NONREACTIVE

## 2025-01-27 LAB — HEPATITIS B SURFACE ANTIGEN: Hepatitis B Surface Ag: NEGATIVE

## 2025-01-27 NOTE — Discharge Instructions (Signed)
 You were seen today for painful urination and swelling.  Go home and shower.  Make sure to clean extraordinarily well and remove any topical prep that may have been used during your colposcopy.  You may use a perineal bottle while urinating to help with any discomfort.  Follow-up with your OB/GYN tomorrow if not improving.  Use a cool compress externally.

## 2025-01-27 NOTE — Telephone Encounter (Signed)
 S/w pt and advised that provider stated to take bendaryl to help with urethra irritation/swelling, use peri bottle for cleaning, and attempt to keep area clean from possible monsels that could leak out of vagina following the colpo procedure. Pt voiced understanding.

## 2025-01-27 NOTE — Telephone Encounter (Signed)
 Routed to provider.

## 2025-01-27 NOTE — ED Provider Notes (Signed)
 "  EMERGENCY DEPARTMENT AT [facility] Provider Note   CSN: [id number] Arrival date & time: 01/26/25  2150     Patient presents with: Dysuria   Raven [name] is a 44 y.o. female.   HPI     This is a 44 year old female who presents with dysuria.  Patient had a colposcopy earlier yesterday morning.  States that since that time she has noted dysuria.  She reports that she feels swollen down there and feels that her urethra is swollen.  She has also noted some discolored discharge which she was told was likely normal.  She has not showered since having the procedure.  Denies any prior history of reactions to topical cleansing products but does state that she has previously reacted to adhesive.  Prior to Admission medications  Medication Sig Start Date End Date Taking? Authorizing Provider  cyclobenzaprine (FLEXERIL) 10 MG tablet Take 10 mg by mouth 3 (three) times daily. 04/01/24   [provider]  dicyclomine  (BENTYL ) 10 MG capsule Take 1 capsule (10 mg total) by mouth every 8 (eight) hours as needed for spasms. 12/14/24   Armbruster, Elspeth SQUIBB, MD  glipiZIDE 2.5 MG TABS Take 1 tablet by mouth every morning. 01/25/24   [provider]  linaclotide  (LINZESS ) 290 MCG CAPS capsule Take 1 capsule (290 mcg total) by mouth daily before breakfast. 01/14/22   Kennedy-Smith, Colleen M, NP  LORazepam  (ATIVAN ) 2 MG tablet Take 2 mg by mouth 3 (three) times daily.    [provider]  oxyCODONE -acetaminophen  (PERCOCET) 10-325 MG tablet Take 1 tablet by mouth 4 (four) times daily. 03/03/23     phentermine (ADIPEX-P) 37.5 MG tablet Take 37.5 mg by mouth daily. 04/08/24   [provider]  sucralfate  (CARAFATE ) 1 GM/10ML suspension Take 10 mLs (1 g total) by mouth 3 (three) times daily as needed. 04/08/24   Cardama, Raynell Moder, MD  sucralfate  (CARAFATE ) 1 GM/10ML suspension Take 10 mLs (1 g total) by mouth every 8 (eight) hours as needed. 06/02/24    Armbruster, Elspeth SQUIBB, MD    Allergies: Codeine, Dust mite extract, Other, Pollen extract, and Promethazine     Review of Systems  Constitutional:  Negative for fever.  Respiratory:  Negative for shortness of breath.   Cardiovascular:  Negative for chest pain.  Genitourinary:  Positive for dysuria, vaginal discharge and vaginal pain.  All other systems reviewed and are negative.   Updated Vital Signs BP (!) 166/78   Pulse 95   Temp 98.2 F (36.8 C)   Resp 16   LMP 01/05/2025 (Exact Date)   SpO2 100%   Physical Exam Vitals and nursing note reviewed. Exam conducted with a chaperone present.  Constitutional:      Appearance: She is well-developed.  HENT:     Head: Normocephalic and atraumatic.  Eyes:     Pupils: Pupils are equal, round, and reactive to light.  Cardiovascular:     Rate and Rhythm: Normal rate and regular rhythm.  Pulmonary:     Effort: Pulmonary effort is normal. No respiratory distress.  Abdominal:     Palpations: Abdomen is soft.     Tenderness: There is no abdominal tenderness.  Genitourinary:    Comments: External vaginal exam without significant obvious swelling, she has some pain and sensitivity at the clitoral hood and external urethra, yellow discharge noted at the vaginal introitus Musculoskeletal:     Cervical back: Neck supple.  Skin:    General: Skin is warm and dry.  Neurological:     Mental Status: She is alert and oriented to person, place, and time.  Psychiatric:        Mood and Affect: Mood normal.     (all labs ordered are listed, but only abnormal results are displayed) Labs Reviewed  URINALYSIS, ROUTINE W REFLEX MICROSCOPIC - Abnormal; Notable for the following components:      Result Value   Hgb urine dipstick TRACE (*)    Bacteria, UA RARE (*)    All other components within normal limits    EKG: None  Radiology: No results found.   Procedures   Medications Ordered in the ED - No data to display                                   Medical Decision Making Amount and/or Complexity of Data Reviewed Labs: ordered.   This patient presents to the ED for concern of dysuria, this involves an extensive number of treatment options, and is a complaint that carries with it a high risk of complications and morbidity.  I considered the following differential and admission for this acute, potentially life threatening condition.  The differential diagnosis includes urethral irritation, UTI, contact dermatitis related to prep for colposcopy, complication from colposcopy  MDM:    This is a 44 year old female who presents with concern for dysuria.  She is nontoxic vital signs are reassuring.  No significant swelling or obvious abnormality on exam.  She had noted yellowish discharge at the vaginal introitus which is normal status post colposcopy.  She has been able to urinate.  She has not showered since the procedure.  She does report some prior reaction to adhesive in the past.  I have encouraged her to go home and shower as directed.  She is not supposed to bathe.  She can use a perineal bottle to make sure to cleanse and remove any potential prep from the area that she may be reacting to.  There is no obvious contact dermatitis but I think cleaning will be helpful.  I have also recommended that she use a cool compress and follow-up closely with her GYN if she continues to have symptoms.  Urinalysis does not show any evidence of infection.  (Labs, imaging, consults)  Labs: I Ordered, and personally interpreted labs.  The pertinent results include: N/A  Imaging Studies ordered: I ordered imaging studies including N/A I independently visualized and interpreted imaging. I agree with the radiologist interpretation  Additional history obtained from chart review.  External records from outside source obtained and reviewed including prior evaluations  Cardiac Monitoring: The patient was not maintained on a cardiac monitor.  If  on the cardiac monitor, I personally viewed and interpreted the cardiac monitored which showed an underlying rhythm of: N/A  Reevaluation: After the interventions noted above, I reevaluated the patient and found that they have :stayed the same  Social Determinants of Health:  lives independently  Disposition: Discharge  Co morbidities that complicate the patient evaluation  Past Medical History:  Diagnosis Date   Abdominal pain, left lower quadrant 03/27/2014   Abnormal CXR 05/03/2019   05/03/2019   Walked RA  2 laps @  approx 269ft each @ fast pace  stopped due to  End of study with sats 95% at end     Abnormal Pap smear    Achalasia    esphog stretched several times   Acute pain  of both knees 09/07/2018   Allergy    seasonal allergies   Anxiety    on meds   Arthritis    generalized-on meds   Asthma    rarely uses inhaler-PRN-stopped smoking-symptoms decreased   ASTHMA 10/09/2008   Spirometry wnl  08/08/14 with active symptoms attributed to VCD     Blood transfusion 01/2006   [facility]   Cardiomyopathy [facility])    Cardiomyopathy, idiopathic (HCC) 01/09/2014   CHF (congestive heart failure) (HCC) 06/2020   on meds   Chronic bilateral low back pain 09/07/2018   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 01/18/2018   Chronically on opiate therapy 01/18/2018   Cigarette smoker 08/09/2014   Constipation due to opioid therapy    Depression    DEPRESSION 10/09/2008   Qualifier: Diagnosis of  By: Winona Raring  -on meds   Dysphagia 08/10/2009   Qualifier: Diagnosis of  By: Ever Riggers, Amy S    Dyspnea 08/08/2014   Followed in Pulmonary clinic/ [facility]/ [facility]  - 08/08/2014  Walked RA x 3 laps @ 185 ft each stopped due to  End of study, no sob or desat - Spirometry 08/08/14 wnl     Dysrhythmia    Esophageal reflux 11/30/2008   Centricity Description: ESOPHAGEAL REFLUX Qualifier: Diagnosis of  By: Debrah MD, Lamar BIRCH  Centricity Description: GERD Qualifier: Diagnosis  of  By: Ever Riggers, Amy S    ESOPHAGEAL STRICTURE 08/10/2009   Qualifier: Diagnosis of  By: Ever Riggers, Amy S    Excessive daytime sleepiness 01/18/2018   Fibromyalgia    on meds   Gallstones and inflammation of gallbladder without obstruction 01/23/2015   GERD (gastroesophageal reflux disease)    on meds - diet controlled   Globus sensation    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   Heart murmur    Heart palpitations    tx with atenolol  by steven klein    Hiatal hernia    History of abnormal Pap smear    History of chlamydia 1990's   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   HSV-2 infection    HTN (hypertension)    on meds   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   Occipital neuralgia of right side 04/21/2017   Palpitation 04/11/2011   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   Pneumonia 05/2013   PVC's (premature ventricular contractions)    S/P laparoscopic surgery 01/23/2015   Shortness of breath dyspnea    Sleep disorder, circadian, 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   Status post dilation of esophageal narrowing    Unspecified constipation 11/30/2008   Centricity Description: UNSPECIFIED CONSTIPATION Qualifier: Diagnosis of  By: Debrah MD, Lamar BIRCH  Centricity Description: CONSTIPATION Qualifier: Diagnosis of  By: Ever Riggers, Amy S    Upper airway cough syndrome 09/05/2014   Followed in Pulmonary clinic/ [facility]/ [facility]  Onset  2002 p tonsilectomy  -Spirometry wnl  08/08/14  - referred to [facility] at [facility] 09/05/2014 > inconclusive w/u by ENT / GI s f/u ov by either since 2016 > referred  Back to Dr Brien 10/20/2017  - Dg Es 01/27/2019  Spontaneous gastroesophageal reflux. 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   I have reviewed the patients home medicines and have made adjustments as needed  Problem List / ED Course: Problem List Items Addressed This Visit   None Visit Diagnoses       Dysuria    -  Primary                Final diagnoses:  Dysuria    ED Discharge Orders     None          Bari Charmaine FALCON, MD 01/27/25 0302  "

## 2025-01-27 NOTE — Telephone Encounter (Signed)
 Returned call, discussed results with patient, advised some urine results are still pending.

## 2025-01-31 ENCOUNTER — Encounter (HOSPITAL_COMMUNITY): Payer: Self-pay | Admitting: Gastroenterology

## 2025-02-01 ENCOUNTER — Other Ambulatory Visit: Payer: Self-pay

## 2025-02-01 LAB — SURGICAL PATHOLOGY

## 2025-02-01 MED ORDER — DICYCLOMINE HCL 10 MG PO CAPS: 10.0000 mg | ORAL_CAPSULE | Freq: Three times a day (TID) | ORAL | 1 refills | Status: AC | PRN

## 2025-02-01 NOTE — Progress Notes (Signed)
 Rec'd refill request for 90 day supply of Bentyl  from [facility] on [address]

## 2025-02-02 ENCOUNTER — Encounter: Payer: Self-pay | Admitting: Obstetrics and Gynecology

## 2025-02-02 NOTE — Progress Notes (Signed)
 MyChart msg previously sent explaining results. Please call pt to discuss further.

## 2025-02-02 NOTE — Progress Notes (Signed)
 Pt has now reviewed previous message and does appear to understand results and is reassured.

## 2025-02-03 ENCOUNTER — Telehealth: Payer: Self-pay

## 2025-02-03 NOTE — Telephone Encounter (Signed)
 Procedure:EGD Procedure date: 02/13/25 Procedure location: [facility] Arrival Time: 9:55 Spoke with the patient Y/N: Y Any prep concerns? N Has the patient obtained the prep from the pharmacy ? N Do you have a care partner and transportation: Y Any additional concerns? N

## 2025-02-13 ENCOUNTER — Ambulatory Visit (HOSPITAL_COMMUNITY): Admit: 2025-02-13 | Admitting: Gastroenterology

## 2025-02-13 ENCOUNTER — Encounter (HOSPITAL_COMMUNITY): Payer: Self-pay
